# Patient Record
Sex: Male | Born: 1960 | Race: White | Hispanic: No | Marital: Married | Smoking: Current every day smoker
Health system: Southern US, Community
[De-identification: ages and names within clinical notes are randomized; demographics above are authoritative.]

## PROBLEM LIST (undated history)

## (undated) DIAGNOSIS — G7109 Other specified muscular dystrophies: Secondary | ICD-10-CM

## (undated) DIAGNOSIS — F1090 Alcohol use, unspecified, uncomplicated: Secondary | ICD-10-CM

## (undated) DIAGNOSIS — R569 Unspecified convulsions: Secondary | ICD-10-CM

## (undated) DIAGNOSIS — Z720 Tobacco use: Secondary | ICD-10-CM

## (undated) DIAGNOSIS — H539 Unspecified visual disturbance: Secondary | ICD-10-CM

## (undated) DIAGNOSIS — D638 Anemia in other chronic diseases classified elsewhere: Secondary | ICD-10-CM

## (undated) DIAGNOSIS — G71039 Limb girdle muscular dystrophy, unspecified: Secondary | ICD-10-CM

## (undated) DIAGNOSIS — Z7289 Other problems related to lifestyle: Secondary | ICD-10-CM

## (undated) DIAGNOSIS — I480 Paroxysmal atrial fibrillation: Secondary | ICD-10-CM

## (undated) DIAGNOSIS — D649 Anemia, unspecified: Secondary | ICD-10-CM

## (undated) DIAGNOSIS — I2699 Other pulmonary embolism without acute cor pulmonale: Secondary | ICD-10-CM

## (undated) DIAGNOSIS — I10 Essential (primary) hypertension: Secondary | ICD-10-CM

## (undated) DIAGNOSIS — N186 End stage renal disease: Secondary | ICD-10-CM

## (undated) DIAGNOSIS — W19XXXA Unspecified fall, initial encounter: Secondary | ICD-10-CM

## (undated) DIAGNOSIS — E559 Vitamin D deficiency, unspecified: Secondary | ICD-10-CM

## (undated) DIAGNOSIS — R296 Repeated falls: Secondary | ICD-10-CM

## (undated) DIAGNOSIS — Z789 Other specified health status: Secondary | ICD-10-CM

## (undated) DIAGNOSIS — G7100 Muscular dystrophy, unspecified: Secondary | ICD-10-CM

## (undated) HISTORY — DX: Unspecified visual disturbance: H53.9

## (undated) HISTORY — PX: CARDIAC SURGERY: SHX584

---

## 2000-05-12 DIAGNOSIS — R079 Chest pain, unspecified: Secondary | ICD-10-CM

## 2000-05-12 HISTORY — DX: Chest pain, unspecified: R07.9

## 2000-05-18 ENCOUNTER — Inpatient Hospital Stay (HOSPITAL_COMMUNITY): Admission: AD | Admit: 2000-05-18 | Discharge: 2000-05-19 | Payer: Self-pay | Admitting: Cardiovascular Disease

## 2007-10-20 ENCOUNTER — Ambulatory Visit (HOSPITAL_COMMUNITY): Admission: RE | Admit: 2007-10-20 | Discharge: 2007-10-20 | Payer: Self-pay | Admitting: Internal Medicine

## 2009-05-12 DIAGNOSIS — I2699 Other pulmonary embolism without acute cor pulmonale: Secondary | ICD-10-CM

## 2009-05-12 HISTORY — DX: Other pulmonary embolism without acute cor pulmonale: I26.99

## 2010-04-06 ENCOUNTER — Inpatient Hospital Stay (HOSPITAL_COMMUNITY)
Admission: EM | Admit: 2010-04-06 | Discharge: 2010-04-22 | Disposition: A | Discharge status: Other Acute Inpatient Hospital | Payer: Self-pay | Source: Home / Self Care | Attending: Internal Medicine | Admitting: Internal Medicine

## 2010-04-11 ENCOUNTER — Encounter (INDEPENDENT_AMBULATORY_CARE_PROVIDER_SITE_OTHER): Payer: Self-pay | Admitting: Internal Medicine

## 2010-04-11 ENCOUNTER — Inpatient Hospital Stay (HOSPITAL_COMMUNITY)
Admission: AD | Admit: 2010-04-11 | Discharge: 2010-04-22 | Payer: Self-pay | Attending: Pulmonary Disease | Admitting: Pulmonary Disease

## 2010-04-11 ENCOUNTER — Ambulatory Visit: Payer: Self-pay | Admitting: Cardiology

## 2010-04-12 ENCOUNTER — Encounter (INDEPENDENT_AMBULATORY_CARE_PROVIDER_SITE_OTHER): Payer: Self-pay | Admitting: Internal Medicine

## 2010-05-09 ENCOUNTER — Ambulatory Visit: Payer: Self-pay | Admitting: Critical Care Medicine

## 2010-05-17 ENCOUNTER — Encounter: Payer: Self-pay | Admitting: Critical Care Medicine

## 2010-05-20 ENCOUNTER — Encounter: Payer: Self-pay | Admitting: Critical Care Medicine

## 2010-05-21 DIAGNOSIS — I10 Essential (primary) hypertension: Secondary | ICD-10-CM | POA: Insufficient documentation

## 2010-05-21 DIAGNOSIS — J90 Pleural effusion, not elsewhere classified: Secondary | ICD-10-CM | POA: Insufficient documentation

## 2010-05-21 DIAGNOSIS — I2699 Other pulmonary embolism without acute cor pulmonale: Secondary | ICD-10-CM | POA: Insufficient documentation

## 2010-05-21 DIAGNOSIS — N179 Acute kidney failure, unspecified: Secondary | ICD-10-CM | POA: Insufficient documentation

## 2010-05-22 ENCOUNTER — Ambulatory Visit
Admission: RE | Admit: 2010-05-22 | Discharge: 2010-05-22 | Payer: Self-pay | Source: Home / Self Care | Attending: Critical Care Medicine | Admitting: Critical Care Medicine

## 2010-05-30 ENCOUNTER — Telehealth (INDEPENDENT_AMBULATORY_CARE_PROVIDER_SITE_OTHER): Payer: Self-pay | Admitting: *Deleted

## 2010-06-02 ENCOUNTER — Encounter: Payer: Self-pay | Admitting: Internal Medicine

## 2010-06-13 NOTE — Assessment & Plan Note (Signed)
Summary: Pulmonary OV   CC:  HFU.  Sill having soreness in right lung.  Ocass wheezing.  Nonprod cough.  Denies SOB and chest tightness. Marland Kitchen  History of Present Illness: Pulmonary post hosp. Pt in [place] 12/1- 12/12 with resp failure, ARF, PE, L pleural effusion, HTN. Cr down now to 6 from 14 Pt not on HD now.  Pt on coumadin per [place] dyspnea is better.  there is less cough and wheeze. There is less mucus production.  No real edema.   Pt denies any significant sore throat, nasal congestion or excess secretions, fever, chills, sweats, unintended weight loss, pleurtic or exertional chest pain, orthopnea PND, or leg swelling Pt denies any increase in rescue therapy over baseline, denies waking up needing it or having any early am or nocturnal exacerbations of coughing/wheezing/or dyspnea.     Preventive Screening-Counseling & Management  Alcohol-Tobacco     Smoking Status: current     Packs/Day: 1.0     Pack years: 40  Current Medications (verified): 1)  Tylenol 8 Hour 650 Mg Cr-Tabs (Acetaminophen) .... Every 6 Hours As Needed Pain 2)  Coumadin 5 Mg Tabs (Warfarin Sodium) .... As Directed By [facility] 3)  Cardizem 90 Mg Tabs (Diltiazem Hcl) .... Take 1 Tablet By Mouth Once A Day 4)  Furosemide 80 Mg Tabs (Furosemide) .... 1/2 Tablet Once Daily  Allergies (verified): 1)  ! * Ivp Dye  Past History:  Past medical, surgical, family and social histories (including risk factors) reviewed, and no changes noted (except as noted below).  Past Medical History: Reviewed history from 05/21/2010 and no changes required. HYPERTENSION (ICD-401.9) RENAL FAILURE, ACUTE (ICD-584.9) PLEURAL EFFUSION (ICD-511.9) PULMONARY EMBOLISM (ICD-415.19)  Past Surgical History: hernia sugery  Family History: Reviewed history and no changes required. brother - DM  Social History: Reviewed history and no changes required. Current smoker.  started at  age 70.  1 ppd. alcohol - 1-2 beers/day married 1 daughter [employer]ain[employer] at [facility] [employer] [facility] [facility] Smoking Status:  current Packs/Day:  1.0 Pack years:  40  Review of Systems       The patient complains of shortness of breath with activity and non-productive cough.  The patient denies shortness of breath at rest, productive cough, coughing up blood, chest pain, irregular heartbeats, acid heartburn, indigestion, loss of appetite, weight change, abdominal pain, difficulty swallowing, sore throat, tooth/dental problems, headaches, nasal congestion/difficulty breathing through nose, sneezing, itching, ear ache, anxiety, depression, hand/feet swelling, joint stiffness or pain, rash, change in color of mucus, and fever.    Vital Signs:  Patient profile:   50 year old male Height:      74 inches Weight:      172.25 pounds BMI:     22.20 O2 Sat:      97 % on Room air Temp:     97.7 degrees F oral Pulse rate:   62 / minute BP sitting:   140 / 90  (left arm) Cuff size:   regular  Vitals Entered By: Raymondo Band RN (May 22, 2010 11:32 AM)  O2 Flow:  Room air  CC: HFU.  Sill having soreness in right lung.  Ocass wheezing.  Nonprod cough.  Denies SOB and chest tightness.  Comments Medications reviewed with patient Daytime contact number verified with patient. Raymondo Band RN  May 22, 2010 11:35 AM    Physical Exam  Additional Exam:  Gen: Pleasant, well-nourished, in no distress,  normal affect ENT: No lesions,  mouth  clear,  oropharynx clear, no postnasal drip Neck: No JVD, no TMG, no carotid bruits Lungs: No use of accessory muscles, no dullness to percussion, decrease BS L lung  Cardiovascular: RRR, heart sounds normal, no murmur or gallops, no peripheral edema Abdomen: soft and NT, no HSM,  BS normal Musculoskeletal: No deformities, no cyanosis or clubbing Neuro: alert, non focal Skin: Warm, no lesions or rashes    Impression & Recommendations:  Problem # 1:  PLEURAL EFFUSION  (ICD-511.9) Assessment Improved pleural effusion, improved. from pulmonary embolii plan monitor Orders: Est. Patient Level V [id number]) T-2 View CXR ([id number])  Problem # 2:  PULMONARY EMBOLISM (ICD-415.19) Assessment: Improved pulmonary embolii improved plan cont coumadin minimum 57months until 12/12. coumadin per [facility] His updated medication list for this problem includes:    Coumadin 5 Mg Tabs (Warfarin sodium) .Marland Kitchen... As directed by [facility]  Orders: Est. Patient Level V [id number]) T-2 View CXR ([id number])  Problem # 3:  RENAL FAILURE, ACUTE (ICD-584.9) Assessment: Improved renal failure  per renal  Medications Added to Medication List This Visit: 1)  Furosemide 80 Mg Tabs (Furosemide) .... 1/2 tablet once daily  Complete Medication List: 1)  Tylenol 8 Hour 650 Mg Cr-tabs (Acetaminophen) .... Every 6 hours as needed pain 2)  Coumadin 5 Mg Tabs (Warfarin sodium) .... As directed by [facility] 3)  Cardizem 90 Mg Tabs (Diltiazem hcl) .... Take 1 tablet by mouth once a day 4)  Furosemide 80 Mg Tabs (Furosemide) .... 1/2 tablet once daily  Patient Instructions: 1)  Stay on coumadin 2)  We will obtain a chest xray today, we will call results 3)  We may need to drain fluid off your right lung with a needle aspirate 4)  Return 3 months Prescriptions: COUMADIN 5 MG TABS (WARFARIN SODIUM) as directed by [facility]  #60 x 6   Entered and Authorized by:   [name] MD   Signed by:   [name] MD on 05/22/2010   Method used:   Electronically to        [facility] (retail)       [address]       [place], [place]  [address]       Ph: [phone]       Fax: [phone]   RxIDWX:[id number] CARDIZEM 90 MG TABS (DILTIAZEM HCL) Take 1 tablet by mouth once a day  #30 x 6   Entered and Authorized by:   [name] MD   Signed by:    [name] MD on 05/22/2010   Method used:   Electronically to        [facility] (retail)       [address]       [place],   [address]       Ph: [phone]       Fax: [phone]   RxID:   [id number]    Immunization History:  Influenza Immunization History:    Influenza:  historical (04/11/2010)  Pneumovax Immunization History:    Pneumovax:  historical (04/11/2010)   Appended Document: Pulmonary OV fax [facility] [place] [facility]

## 2010-06-13 NOTE — Letter (Signed)
Summary: [facility]   Imported By: Phillis Knack 06/05/2010 08:02:48  _____________________________________________________________________  External Attachment:    Type:   Image     Comment:   External Document

## 2010-06-13 NOTE — Progress Notes (Signed)
Summary: results  Phone Note Call from Patient Call back at Home Phone [phone]   Caller: Steven Sellers Call For: [name] Reason for Call: Lab or Test Results Summary of Call: Requesting xray results from last week. Initial call taken by: [name],  May 30, 2010 2:33 PM  Follow-up for Phone Call        Pls advise results of cxr thanks [name]  May 30, 2010 3:35 PM   Additional Follow-up for Phone Call Additional follow up Details #1::        cxr shows fluid and inflammation gone I tried to call but number in chart seems incorrect i get a rapid busy signal Additional Follow-up by: [name] MD,  May 30, 2010 3:41 PM    Additional Follow-up for Phone Call Additional follow up Details #2::    Spoke with pt and notified of the above recs per PW.  Pt verbalized understanding. Follow-up by: [name],  May 30, 2010 3:49 PM

## 2010-07-02 ENCOUNTER — Encounter: Payer: Self-pay | Admitting: Critical Care Medicine

## 2010-07-22 LAB — PROTIME-INR
INR: 1.44 (ref 0.00–1.49)
INR: 1.66 — ABNORMAL HIGH (ref 0.00–1.49)
INR: 2.46 — ABNORMAL HIGH (ref 0.00–1.49)
INR: 2.51 — ABNORMAL HIGH (ref 0.00–1.49)
INR: 2.98 — ABNORMAL HIGH (ref 0.00–1.49)
INR: 3.03 — ABNORMAL HIGH (ref 0.00–1.49)
INR: 6.2 (ref 0.00–1.49)
Prothrombin Time: 17.7 seconds — ABNORMAL HIGH (ref 11.6–15.2)
Prothrombin Time: 19.8 seconds — ABNORMAL HIGH (ref 11.6–15.2)
Prothrombin Time: 20.1 seconds — ABNORMAL HIGH (ref 11.6–15.2)
Prothrombin Time: 22.4 seconds — ABNORMAL HIGH (ref 11.6–15.2)
Prothrombin Time: 26.8 seconds — ABNORMAL HIGH (ref 11.6–15.2)
Prothrombin Time: 27.2 seconds — ABNORMAL HIGH (ref 11.6–15.2)
Prothrombin Time: 31 seconds — ABNORMAL HIGH (ref 11.6–15.2)
Prothrombin Time: 33.5 seconds — ABNORMAL HIGH (ref 11.6–15.2)
Prothrombin Time: 54.5 seconds — ABNORMAL HIGH (ref 11.6–15.2)

## 2010-07-22 LAB — GLUCOSE, CAPILLARY
Glucose-Capillary: 102 mg/dL — ABNORMAL HIGH (ref 70–99)
Glucose-Capillary: 106 mg/dL — ABNORMAL HIGH (ref 70–99)
Glucose-Capillary: 106 mg/dL — ABNORMAL HIGH (ref 70–99)
Glucose-Capillary: 108 mg/dL — ABNORMAL HIGH (ref 70–99)
Glucose-Capillary: 113 mg/dL — ABNORMAL HIGH (ref 70–99)
Glucose-Capillary: 113 mg/dL — ABNORMAL HIGH (ref 70–99)
Glucose-Capillary: 116 mg/dL — ABNORMAL HIGH (ref 70–99)
Glucose-Capillary: 117 mg/dL — ABNORMAL HIGH (ref 70–99)
Glucose-Capillary: 162 mg/dL — ABNORMAL HIGH (ref 70–99)
Glucose-Capillary: 78 mg/dL (ref 70–99)
Glucose-Capillary: 80 mg/dL (ref 70–99)
Glucose-Capillary: 86 mg/dL (ref 70–99)
Glucose-Capillary: 88 mg/dL (ref 70–99)
Glucose-Capillary: 88 mg/dL (ref 70–99)
Glucose-Capillary: 91 mg/dL (ref 70–99)
Glucose-Capillary: 96 mg/dL (ref 70–99)
Glucose-Capillary: 99 mg/dL (ref 70–99)

## 2010-07-22 LAB — CBC
HCT: 35.8 % — ABNORMAL LOW (ref 39.0–52.0)
HCT: 36.1 % — ABNORMAL LOW (ref 39.0–52.0)
HCT: 37 % — ABNORMAL LOW (ref 39.0–52.0)
Hemoglobin: 12 g/dL — ABNORMAL LOW (ref 13.0–17.0)
Hemoglobin: 12.7 g/dL — ABNORMAL LOW (ref 13.0–17.0)
Hemoglobin: 12.8 g/dL — ABNORMAL LOW (ref 13.0–17.0)
Hemoglobin: 13.6 g/dL (ref 13.0–17.0)
MCH: 30.4 pg (ref 26.0–34.0)
MCH: 31.2 pg (ref 26.0–34.0)
MCH: 31.3 pg (ref 26.0–34.0)
MCH: 31.4 pg (ref 26.0–34.0)
MCH: 31.4 pg (ref 26.0–34.0)
MCHC: 33.5 g/dL (ref 30.0–36.0)
MCHC: 33.5 g/dL (ref 30.0–36.0)
MCHC: 33.8 g/dL (ref 30.0–36.0)
MCHC: 34.3 g/dL (ref 30.0–36.0)
MCV: 91.7 fL (ref 78.0–100.0)
MCV: 92.3 fL (ref 78.0–100.0)
Platelets: 211 10*3/uL (ref 150–400)
Platelets: 271 10*3/uL (ref 150–400)
Platelets: 327 10*3/uL (ref 150–400)
RBC: 3.91 MIL/uL — ABNORMAL LOW (ref 4.22–5.81)
RBC: 4.04 MIL/uL — ABNORMAL LOW (ref 4.22–5.81)
RBC: 4.26 MIL/uL (ref 4.22–5.81)
RBC: 4.47 MIL/uL (ref 4.22–5.81)
RDW: 13 % (ref 11.5–15.5)
RDW: 13.1 % (ref 11.5–15.5)
RDW: 13.1 % (ref 11.5–15.5)
RDW: 13.2 % (ref 11.5–15.5)
RDW: 13.2 % (ref 11.5–15.5)
WBC: 10.3 10*3/uL (ref 4.0–10.5)
WBC: 8.7 10*3/uL (ref 4.0–10.5)

## 2010-07-22 LAB — RENAL FUNCTION PANEL
Albumin: 1.6 g/dL — ABNORMAL LOW (ref 3.5–5.2)
Albumin: 1.6 g/dL — ABNORMAL LOW (ref 3.5–5.2)
Albumin: 1.9 g/dL — ABNORMAL LOW (ref 3.5–5.2)
BUN: 41 mg/dL — ABNORMAL HIGH (ref 6–23)
BUN: 49 mg/dL — ABNORMAL HIGH (ref 6–23)
BUN: 51 mg/dL — ABNORMAL HIGH (ref 6–23)
BUN: 55 mg/dL — ABNORMAL HIGH (ref 6–23)
CO2: 24 mEq/L (ref 19–32)
Calcium: 7.9 mg/dL — ABNORMAL LOW (ref 8.4–10.5)
Calcium: 8 mg/dL — ABNORMAL LOW (ref 8.4–10.5)
Calcium: 8.1 mg/dL — ABNORMAL LOW (ref 8.4–10.5)
Chloride: 104 mEq/L (ref 96–112)
Chloride: 98 mEq/L (ref 96–112)
Creatinine, Ser: 6.96 mg/dL — ABNORMAL HIGH (ref 0.4–1.5)
Creatinine, Ser: 8.16 mg/dL — ABNORMAL HIGH (ref 0.4–1.5)
Creatinine, Ser: 9.18 mg/dL — ABNORMAL HIGH (ref 0.4–1.5)
Creatinine, Ser: 9.26 mg/dL — ABNORMAL HIGH (ref 0.4–1.5)
GFR calc Af Amer: 7 mL/min — ABNORMAL LOW (ref >60)
GFR calc non Af Amer: 6 mL/min — ABNORMAL LOW (ref >60)
Glucose, Bld: 110 mg/dL — ABNORMAL HIGH (ref 70–99)
Glucose, Bld: 91 mg/dL (ref 70–99)
Phosphorus: 6.7 mg/dL — ABNORMAL HIGH (ref 2.3–4.6)
Phosphorus: 7 mg/dL — ABNORMAL HIGH (ref 2.3–4.6)
Phosphorus: 8.1 mg/dL — ABNORMAL HIGH (ref 2.3–4.6)
Potassium: 4.1 mEq/L (ref 3.5–5.1)
Potassium: 4.6 mEq/L (ref 3.5–5.1)
Sodium: 136 mEq/L (ref 135–145)

## 2010-07-22 LAB — CULTURE, BLOOD (ROUTINE X 2)
Culture  Setup Time: 201112020142
Culture  Setup Time: 201112020143
Culture: NO GROWTH

## 2010-07-22 LAB — PREPARE FRESH FROZEN PLASMA: Unit division: 0

## 2010-07-22 LAB — BASIC METABOLIC PANEL
BUN: 31 mg/dL — ABNORMAL HIGH (ref 6–23)
BUN: 36 mg/dL — ABNORMAL HIGH (ref 6–23)
BUN: 47 mg/dL — ABNORMAL HIGH (ref 6–23)
BUN: 56 mg/dL — ABNORMAL HIGH (ref 6–23)
CO2: 21 mEq/L (ref 19–32)
CO2: 26 mEq/L (ref 19–32)
Calcium: 7.6 mg/dL — ABNORMAL LOW (ref 8.4–10.5)
Calcium: 7.6 mg/dL — ABNORMAL LOW (ref 8.4–10.5)
Calcium: 7.6 mg/dL — ABNORMAL LOW (ref 8.4–10.5)
Calcium: 7.9 mg/dL — ABNORMAL LOW (ref 8.4–10.5)
Calcium: 8.4 mg/dL (ref 8.4–10.5)
Chloride: 103 mEq/L (ref 96–112)
Chloride: 97 mEq/L (ref 96–112)
Creatinine, Ser: 5.63 mg/dL — ABNORMAL HIGH (ref 0.4–1.5)
Creatinine, Ser: 5.92 mg/dL — ABNORMAL HIGH (ref 0.4–1.5)
Creatinine, Ser: 6.73 mg/dL — ABNORMAL HIGH (ref 0.4–1.5)
Creatinine, Ser: 8.07 mg/dL — ABNORMAL HIGH (ref 0.4–1.5)
GFR calc Af Amer: 12 mL/min — ABNORMAL LOW (ref >60)
GFR calc Af Amer: 8 mL/min — ABNORMAL LOW (ref >60)
GFR calc non Af Amer: 10 mL/min — ABNORMAL LOW (ref >60)
GFR calc non Af Amer: 11 mL/min — ABNORMAL LOW (ref >60)
GFR calc non Af Amer: 7 mL/min — ABNORMAL LOW (ref >60)
GFR calc non Af Amer: 9 mL/min — ABNORMAL LOW (ref >60)
Glucose, Bld: 104 mg/dL — ABNORMAL HIGH (ref 70–99)
Glucose, Bld: 129 mg/dL — ABNORMAL HIGH (ref 70–99)
Glucose, Bld: 172 mg/dL — ABNORMAL HIGH (ref 70–99)
Glucose, Bld: 69 mg/dL — ABNORMAL LOW (ref 70–99)
Glucose, Bld: 93 mg/dL (ref 70–99)
Potassium: 4.1 mEq/L (ref 3.5–5.1)
Potassium: 4.2 mEq/L (ref 3.5–5.1)
Sodium: 138 mEq/L (ref 135–145)

## 2010-07-22 LAB — POCT I-STAT 3, ART BLOOD GAS (G3+)
Acid-base deficit: 4 mmol/L — ABNORMAL HIGH (ref 0.0–2.0)
Bicarbonate: 26.1 mEq/L — ABNORMAL HIGH (ref 20.0–24.0)
O2 Saturation: 95 %
Patient temperature: 37
Patient temperature: 97.5
pH, Arterial: 7.39 (ref 7.350–7.450)

## 2010-07-22 LAB — PROTEIN ELECTROPH W RFLX QUANT IMMUNOGLOBULINS
Albumin ELP: 36.1 % — ABNORMAL LOW (ref 55.8–66.1)
Beta 2: 9.6 % — ABNORMAL HIGH (ref 3.2–6.5)
Gamma Globulin: 11.3 % (ref 11.1–18.8)

## 2010-07-22 LAB — HEPATITIS B CORE ANTIBODY, TOTAL: Hep B Core Total Ab: NEGATIVE

## 2010-07-22 LAB — URINALYSIS, ROUTINE W REFLEX MICROSCOPIC
Bilirubin Urine: NEGATIVE
Glucose, UA: NEGATIVE mg/dL
Ketones, ur: 15 mg/dL — AB
Nitrite: POSITIVE — AB
Protein, ur: 100 mg/dL — AB
pH: 5 (ref 5.0–8.0)

## 2010-07-22 LAB — UIFE/LIGHT CHAINS/TP QN, 24-HR UR
Albumin, U: DETECTED
Alpha 2, Urine: DETECTED — AB
Beta, Urine: DETECTED — AB
Total Protein, Urine: 26.4 mg/dL

## 2010-07-22 LAB — COMPREHENSIVE METABOLIC PANEL
ALT: 18 U/L (ref 0–53)
Alkaline Phosphatase: 127 U/L — ABNORMAL HIGH (ref 39–117)
BUN: 22 mg/dL (ref 6–23)
Calcium: 7.4 mg/dL — ABNORMAL LOW (ref 8.4–10.5)
Calcium: 7.8 mg/dL — ABNORMAL LOW (ref 8.4–10.5)
Creatinine, Ser: 6.15 mg/dL — ABNORMAL HIGH (ref 0.4–1.5)
GFR calc non Af Amer: 10 mL/min — ABNORMAL LOW (ref >60)
Glucose, Bld: 103 mg/dL — ABNORMAL HIGH (ref 70–99)
Glucose, Bld: 111 mg/dL — ABNORMAL HIGH (ref 70–99)
Potassium: 4.1 mEq/L (ref 3.5–5.1)
Sodium: 137 mEq/L (ref 135–145)
Total Protein: 4.7 g/dL — ABNORMAL LOW (ref 6.0–8.3)
Total Protein: 5.1 g/dL — ABNORMAL LOW (ref 6.0–8.3)

## 2010-07-22 LAB — C3 COMPLEMENT: C3 Complement: 131 mg/dL (ref 88–201)

## 2010-07-22 LAB — IGG, IGA, IGM: IgM, Serum: 61 mg/dL (ref 60–263)

## 2010-07-22 LAB — LACTIC ACID, PLASMA: Lactic Acid, Venous: 1.2 mmol/L (ref 0.5–2.2)

## 2010-07-22 LAB — URINE MICROSCOPIC-ADD ON

## 2010-07-22 LAB — APTT
aPTT: 106 seconds — ABNORMAL HIGH (ref 24–37)
aPTT: 73 seconds — ABNORMAL HIGH (ref 24–37)
aPTT: 82 seconds — ABNORMAL HIGH (ref 24–37)

## 2010-07-22 LAB — HEPATITIS B SURFACE ANTIBODY,QUALITATIVE: Hep B S Ab: NEGATIVE

## 2010-07-22 LAB — TYPE AND SCREEN: ABO/RH(D): A POS

## 2010-07-22 LAB — ALT: ALT: 20 U/L (ref 0–53)

## 2010-07-22 LAB — MRSA PCR SCREENING: MRSA by PCR: NEGATIVE

## 2010-07-22 LAB — EXPECTORATED SPUTUM ASSESSMENT W GRAM STAIN, RFLX TO RESP C

## 2010-07-22 LAB — HEMOGLOBIN A1C
Hgb A1c MFr Bld: 5.5 % (ref <5.7)
Mean Plasma Glucose: 111 mg/dL (ref <117)

## 2010-07-22 LAB — IMMUNOFIXATION ADD-ON

## 2010-07-23 LAB — URINALYSIS, MICROSCOPIC ONLY
Glucose, UA: NEGATIVE mg/dL
Leukocytes, UA: NEGATIVE
pH: 6.5 (ref 5.0–8.0)

## 2010-07-23 LAB — CBC
HCT: 43.1 % (ref 39.0–52.0)
HCT: 43.8 % (ref 39.0–52.0)
HCT: 52.7 % — ABNORMAL HIGH (ref 39.0–52.0)
Hemoglobin: 14 g/dL (ref 13.0–17.0)
Hemoglobin: 14.2 g/dL (ref 13.0–17.0)
Hemoglobin: 14.6 g/dL (ref 13.0–17.0)
Hemoglobin: 15.1 g/dL (ref 13.0–17.0)
MCH: 31.6 pg (ref 26.0–34.0)
MCH: 32.3 pg (ref 26.0–34.0)
MCH: 33.2 pg (ref 26.0–34.0)
MCV: 93.8 fL (ref 78.0–100.0)
MCV: 94.1 fL (ref 78.0–100.0)
MCV: 94.6 fL (ref 78.0–100.0)
Platelets: 151 10*3/uL (ref 150–400)
Platelets: 159 10*3/uL (ref 150–400)
Platelets: 171 10*3/uL (ref 150–400)
Platelets: 203 10*3/uL (ref 150–400)
Platelets: 203 10*3/uL (ref 150–400)
RBC: 4.39 MIL/uL (ref 4.22–5.81)
RBC: 4.43 MIL/uL (ref 4.22–5.81)
RBC: 4.45 MIL/uL (ref 4.22–5.81)
RBC: 4.58 MIL/uL (ref 4.22–5.81)
RBC: 4.67 MIL/uL (ref 4.22–5.81)
RDW: 13 % (ref 11.5–15.5)
RDW: 13 % (ref 11.5–15.5)
WBC: 11 10*3/uL — ABNORMAL HIGH (ref 4.0–10.5)
WBC: 11.1 10*3/uL — ABNORMAL HIGH (ref 4.0–10.5)
WBC: 13.6 10*3/uL — ABNORMAL HIGH (ref 4.0–10.5)
WBC: 14.4 10*3/uL — ABNORMAL HIGH (ref 4.0–10.5)
WBC: 9.4 10*3/uL (ref 4.0–10.5)

## 2010-07-23 LAB — URINE CULTURE
Culture  Setup Time: 201111301425
Special Requests: POSITIVE

## 2010-07-23 LAB — PROTIME-INR
INR: 1.17 (ref 0.00–1.49)
INR: 2.38 — ABNORMAL HIGH (ref 0.00–1.49)
INR: 4.28 — ABNORMAL HIGH (ref 0.00–1.49)
Prothrombin Time: 21.6 seconds — ABNORMAL HIGH (ref 11.6–15.2)
Prothrombin Time: 24.7 seconds — ABNORMAL HIGH (ref 11.6–15.2)
Prothrombin Time: 41 seconds — ABNORMAL HIGH (ref 11.6–15.2)

## 2010-07-23 LAB — BLOOD GAS, ARTERIAL
Acid-base deficit: 5.8 mmol/L — ABNORMAL HIGH (ref 0.0–2.0)
Acid-base deficit: 7 mmol/L — ABNORMAL HIGH (ref 0.0–2.0)
Acid-base deficit: 7.2 mmol/L — ABNORMAL HIGH (ref 0.0–2.0)
Bicarbonate: 20.9 mEq/L (ref 20.0–24.0)
O2 Saturation: 98 %
Patient temperature: 37
TCO2: 17.7 mmol/L (ref 0–100)
TCO2: 19.2 mmol/L (ref 0–100)
pCO2 arterial: 40.1 mmHg (ref 35.0–45.0)
pCO2 arterial: 48.4 mmHg — ABNORMAL HIGH (ref 35.0–45.0)
pO2, Arterial: 109 mmHg — ABNORMAL HIGH (ref 80.0–100.0)

## 2010-07-23 LAB — BETA-2-GLYCOPROTEIN I ABS, IGG/M/A
Beta-2 Glyco I IgG: 17 G Units (ref <20)
Beta-2-Glycoprotein I IgM: 11 M Units (ref <20)

## 2010-07-23 LAB — COMPREHENSIVE METABOLIC PANEL
Albumin: 1.8 g/dL — ABNORMAL LOW (ref 3.5–5.2)
Alkaline Phosphatase: 75 U/L (ref 39–117)
BUN: 9 mg/dL (ref 6–23)
CO2: 25 mEq/L (ref 19–32)
Chloride: 102 mEq/L (ref 96–112)
Creatinine, Ser: 1.11 mg/dL (ref 0.4–1.5)
GFR calc non Af Amer: >60 mL/min (ref >60)
Glucose, Bld: 101 mg/dL — ABNORMAL HIGH (ref 70–99)
Potassium: 4 mEq/L (ref 3.5–5.1)
Total Bilirubin: 1 mg/dL (ref 0.3–1.2)

## 2010-07-23 LAB — BASIC METABOLIC PANEL
BUN: 35 mg/dL — ABNORMAL HIGH (ref 6–23)
BUN: 7 mg/dL (ref 6–23)
CO2: 21 mEq/L (ref 19–32)
CO2: 21 mEq/L (ref 19–32)
CO2: 28 mEq/L (ref 19–32)
Calcium: 7.6 mg/dL — ABNORMAL LOW (ref 8.4–10.5)
Chloride: 100 mEq/L (ref 96–112)
Chloride: 101 mEq/L (ref 96–112)
Chloride: 103 mEq/L (ref 96–112)
Creatinine, Ser: 0.85 mg/dL (ref 0.4–1.5)
Creatinine, Ser: 2.92 mg/dL — ABNORMAL HIGH (ref 0.4–1.5)
Creatinine, Ser: 5.74 mg/dL — ABNORMAL HIGH (ref 0.4–1.5)
GFR calc Af Amer: 19 mL/min — ABNORMAL LOW (ref >60)
GFR calc Af Amer: 28 mL/min — ABNORMAL LOW (ref >60)
GFR calc non Af Amer: 16 mL/min — ABNORMAL LOW (ref >60)
Glucose, Bld: 109 mg/dL — ABNORMAL HIGH (ref 70–99)
Glucose, Bld: 99 mg/dL (ref 70–99)
Potassium: 4 mEq/L (ref 3.5–5.1)
Potassium: 4.6 mEq/L (ref 3.5–5.1)
Potassium: 5.3 mEq/L — ABNORMAL HIGH (ref 3.5–5.1)
Sodium: 136 mEq/L (ref 135–145)
Sodium: 137 mEq/L (ref 135–145)

## 2010-07-23 LAB — RENAL FUNCTION PANEL
Albumin: 1.5 g/dL — ABNORMAL LOW (ref 3.5–5.2)
BUN: 31 mg/dL — ABNORMAL HIGH (ref 6–23)
Chloride: 105 mEq/L (ref 96–112)
Creatinine, Ser: 5.27 mg/dL — ABNORMAL HIGH (ref 0.4–1.5)
GFR calc Af Amer: 14 mL/min — ABNORMAL LOW (ref >60)
GFR calc non Af Amer: 12 mL/min — ABNORMAL LOW (ref >60)
Phosphorus: 9.1 mg/dL (ref 2.3–4.6)
Potassium: 5.5 mEq/L — ABNORMAL HIGH (ref 3.5–5.1)

## 2010-07-23 LAB — DIFFERENTIAL
Basophils Absolute: 0 10*3/uL (ref 0.0–0.1)
Basophils Absolute: 0 10*3/uL (ref 0.0–0.1)
Basophils Absolute: 0.1 10*3/uL (ref 0.0–0.1)
Basophils Relative: 0 % (ref 0–1)
Basophils Relative: 0 % (ref 0–1)
Basophils Relative: 0 % (ref 0–1)
Eosinophils Relative: 0 % (ref 0–5)
Eosinophils Relative: 1 % (ref 0–5)
Lymphocytes Relative: 11 % — ABNORMAL LOW (ref 12–46)
Lymphocytes Relative: 11 % — ABNORMAL LOW (ref 12–46)
Lymphocytes Relative: 9 % — ABNORMAL LOW (ref 12–46)
Lymphs Abs: 0.4 10*3/uL — ABNORMAL LOW (ref 0.7–4.0)
Lymphs Abs: 0.9 10*3/uL (ref 0.7–4.0)
Lymphs Abs: 1 10*3/uL (ref 0.7–4.0)
Monocytes Absolute: 1.4 10*3/uL — ABNORMAL HIGH (ref 0.1–1.0)
Monocytes Absolute: 1.8 10*3/uL — ABNORMAL HIGH (ref 0.1–1.0)
Monocytes Relative: 10 % (ref 3–12)
Monocytes Relative: 11 % (ref 3–12)
Monocytes Relative: 12 % (ref 3–12)
Neutro Abs: 11.8 10*3/uL — ABNORMAL HIGH (ref 1.7–7.7)
Neutro Abs: 8.1 10*3/uL — ABNORMAL HIGH (ref 1.7–7.7)
Neutro Abs: 8.4 10*3/uL — ABNORMAL HIGH (ref 1.7–7.7)
Neutro Abs: 8.5 10*3/uL — ABNORMAL HIGH (ref 1.7–7.7)
Neutrophils Relative %: 77 % (ref 43–77)
Neutrophils Relative %: 80 % — ABNORMAL HIGH (ref 43–77)
Neutrophils Relative %: 87 % — ABNORMAL HIGH (ref 43–77)

## 2010-07-23 LAB — HEPATITIS PANEL, ACUTE
Hep A IgM: NEGATIVE
Hep B C IgM: NEGATIVE
Hepatitis B Surface Ag: NEGATIVE

## 2010-07-23 LAB — COMPLEMENT, TOTAL: Compl, Total (CH50): >60 U/mL — ABNORMAL HIGH (ref 31–60)

## 2010-07-23 LAB — LUPUS ANTICOAGULANT PANEL
DRVVT: 43.6 secs (ref 36.2–44.3)
Lupus Anticoagulant: DETECTED — AB
PTTLA 4:1 Mix: 45.7 secs — ABNORMAL HIGH (ref 30.0–45.6)

## 2010-07-23 LAB — CARDIAC PANEL(CRET KIN+CKTOT+MB+TROPI)
CK, MB: 2.9 ng/mL (ref 0.3–4.0)
CK, MB: 3.7 ng/mL (ref 0.3–4.0)
Total CK: 132 U/L (ref 7–232)

## 2010-07-23 LAB — CULTURE, BLOOD (ROUTINE X 2)
Culture: NO GROWTH
Culture: NO GROWTH

## 2010-07-23 LAB — HEPARIN LEVEL (UNFRACTIONATED): Heparin Unfractionated: <0.1 IU/mL — ABNORMAL LOW (ref 0.30–0.70)

## 2010-07-23 LAB — HEPATIC FUNCTION PANEL
ALT: 22 U/L (ref 0–53)
AST: 28 U/L (ref 0–37)
Albumin: 2.5 g/dL — ABNORMAL LOW (ref 3.5–5.2)
Alkaline Phosphatase: 102 U/L (ref 39–117)
Bilirubin, Direct: 0.3 mg/dL (ref 0.0–0.3)
Total Bilirubin: 1.3 mg/dL — ABNORMAL HIGH (ref 0.3–1.2)

## 2010-07-23 LAB — HOMOCYSTEINE: Homocysteine: 15.1 umol/L (ref 4.0–15.4)

## 2010-07-23 LAB — APTT
aPTT: 107 seconds — ABNORMAL HIGH (ref 24–37)
aPTT: 78 seconds — ABNORMAL HIGH (ref 24–37)

## 2010-07-23 LAB — C4 COMPLEMENT: Complement C4, Body Fluid: 31 mg/dL (ref 16–47)

## 2010-07-23 LAB — CARDIOLIPIN ANTIBODIES, IGG, IGM, IGA
Anticardiolipin IgA: 26 APL U/mL — ABNORMAL HIGH (ref <22)
Anticardiolipin IgG: 4 GPL U/mL — ABNORMAL LOW (ref <23)

## 2010-07-23 LAB — PROTEIN C ACTIVITY: Protein C Activity: 58 % — ABNORMAL LOW (ref 75–133)

## 2010-07-23 LAB — ANA: Anti Nuclear Antibody(ANA): NEGATIVE

## 2010-07-23 LAB — CEA: CEA: <0.5 ng/mL (ref 0.0–5.0)

## 2010-07-23 LAB — PROTEIN C, TOTAL: Protein C, Total: 54 % — ABNORMAL LOW (ref 70–140)

## 2010-07-23 LAB — GLOMERULAR BASEMENT MEMBRANE ANTIBODIES

## 2010-07-23 LAB — LACTIC ACID, PLASMA: Lactic Acid, Venous: 1 mmol/L (ref 0.5–2.2)

## 2010-07-23 LAB — ANTITHROMBIN III: AntiThromb III Func: 136 % — ABNORMAL HIGH (ref 76–126)

## 2010-07-23 NOTE — Letter (Signed)
Summary: [facility]   Imported By: Phillis Knack 07/16/2010 12:24:22  _____________________________________________________________________  External Attachment:    Type:   Image     Comment:   External Document

## 2010-09-27 NOTE — Cardiovascular Report (Signed)
[place]. [facility]  Patient:    Steven Sellers, Steven Sellers                      MRN: [id number] Proc. Date: 05/19/00 Adm. Date:  [id number] Attending:  Bosie Clos CC:         Elsie Lincoln, M.D.  Walden Field, M.D., [place]   Cardiac Catheterization  PROCEDURE PERFORMED:  Coronary arteriography.  INDICATIONS:  Recurrent chest pain requiring two emergency room visits.  CORONARY ARTERIOGRAPHY:  Left main coronary artery:  The left main coronary artery was normal.  Left anterior descending artery:  The left anterior descending artery was normal.  There were a large intermediate branch which was normal.  The first diagonal branch was normal.  Circumflex coronary artery:  The circumflex coronary artery was nondominant and it was normal.  Right coronary artery:  The right coronary artery was dominant and it was normal.  RIGHT ANTERIOR OBLIQUE VENTRICULOGRAPHY:  The RAO ventriculography revealed normal ejection fraction in the 65% range.  There was no MR and no gradient across the aortic valve.  Aortic pressure was 137/70, LV pressure was 131/19.  IMPRESSION:  The patients chest pain would appear to be noncardiac in etiology.  So long as his femoral artery heals well, he will be discharged later today.  RECOMMENDATIONS:  He will follow up with Dr. Orson Ape. DD:  05/19/00 TD:  05/19/00 Job: [id number]

## 2010-09-27 NOTE — Discharge Summary (Signed)
[name]  Patient:    Steven Sellers, Steven Sellers                      MRN: [id number] Adm. Date:  [id number] Disc. Date: 05/19/00 Attending:  Bosie Clos Dictator:   Tad Moore, P.A.- C. CC:         Wallis Bamberg. Johnsie Cancel, M.D. [facility]  Elsie Lincoln, M.D.; [place], [place], [place] [address]   Discharge Summary  DISCHARGE DIAGNOSIS:   Chest pain, uncertain origin.  HOSPITAL COURSE:  The patient presented to the emergency room complaining of chest pain.  He was seen in the emergency room approximately two weeks ago. Details of that visit are not known at this time.  Pain on presentation to the emergency room was described as not being consistently exertional, however, it was responsive to nitroglycerin.  He had no associated shortness of breath, nausea, vomiting or diaphoresis.  Cardiac risk factors include a family history of one pack per day to tobacco usage.  Also, at the time of admission, his lipid status is unknown.  He was seen and admitted by Dr. Jenkins Rouge. Admitting diagnosis was unstable angina.  The patient was placed on nitroglycerin and heparin.  On May 19, 2000, the Dr. Johnsie Cancel took the patient to the catheterization lab.  Catheterization revealed normal coronaries, normal left ventricle, ejection fraction of 55%.  The patient is stable and pain free at the time of discharge.  DISCHARGE MEDICATIONS:  Enteric coated aspirin 325 mg p.o. q.d.  DISCHARGE INSTRUCTIONS: 1. The patient was to avoid heavy lifting, driving, sexual activity for the    next 48 hours. 2. The patient is to follow a low fat, low cholesterol diet. 3. The patient is to watch the catheterization for pain, bleeding,    swelling, discharge.  He is to call the [place] office if he has    any of these problems. 4. The patient was advised to stop smoking. 5. The patient is to call Dr. Anitra Lauth for an appointment on Monday,    May 26, 1999.  LABORATORY DATA:   White count 8.7, hemoglobin 13.4, hematocrit 39.2, platelets 186,000.  Sodium 135, potassium 3.9, chloride 101, CO2 24, BUN 14, creatinine 0.8, glucose 108.  Of note, AST was 45 and ALT was 46.  The patients lipids were elevated.  Total cholesterol was 225, triglycerides were markedly elevated at 1086, HDL 38.  The LDL was not able to be calculated because of her triglyceride level.  Given the patient is to have an appointment with Dr. Orson Ape next week, lipid management is going to be left up to his discretion.  The patient is discharge home in stable condition. DD:  05/19/00 TD:  05/19/00 Job: [id number]

## 2010-10-09 ENCOUNTER — Encounter: Payer: Self-pay | Admitting: Critical Care Medicine

## 2012-02-07 IMAGING — CR DG RIBS W/ CHEST 3+V*R*
5 series · 5 of 5 positions shown · non-contrast
Comparison: CT chest 10/20/2007

CLINICAL DATA: Rib pain and difficulty breathing.

RIGHT RIBS AND CHEST - 3+ VIEW

[view not recorded (1 of 5)]
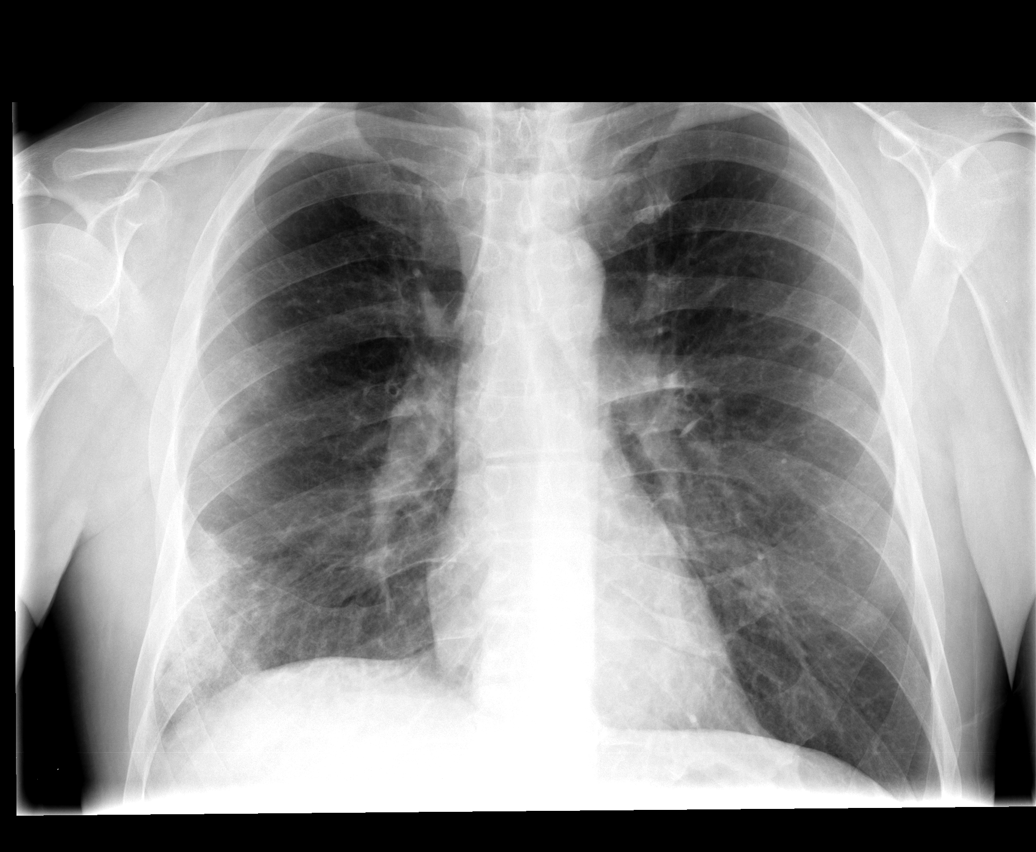

[view not recorded (2 of 5)]
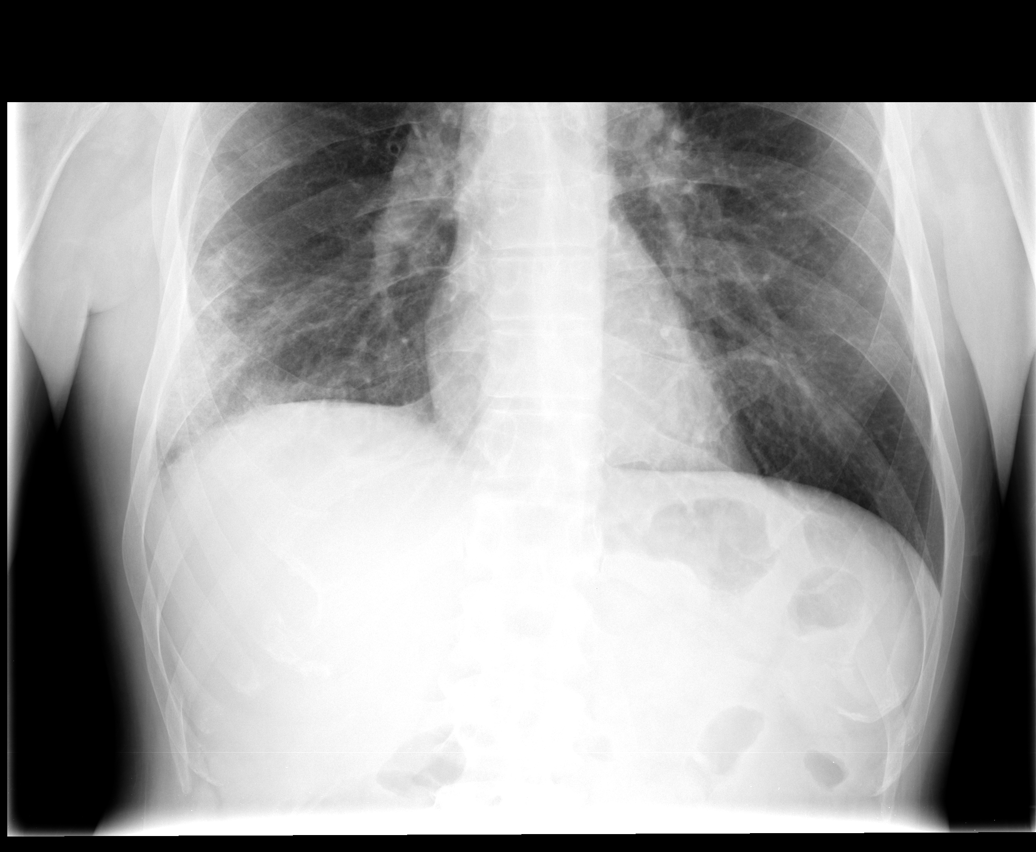

[view not recorded (3 of 5)]
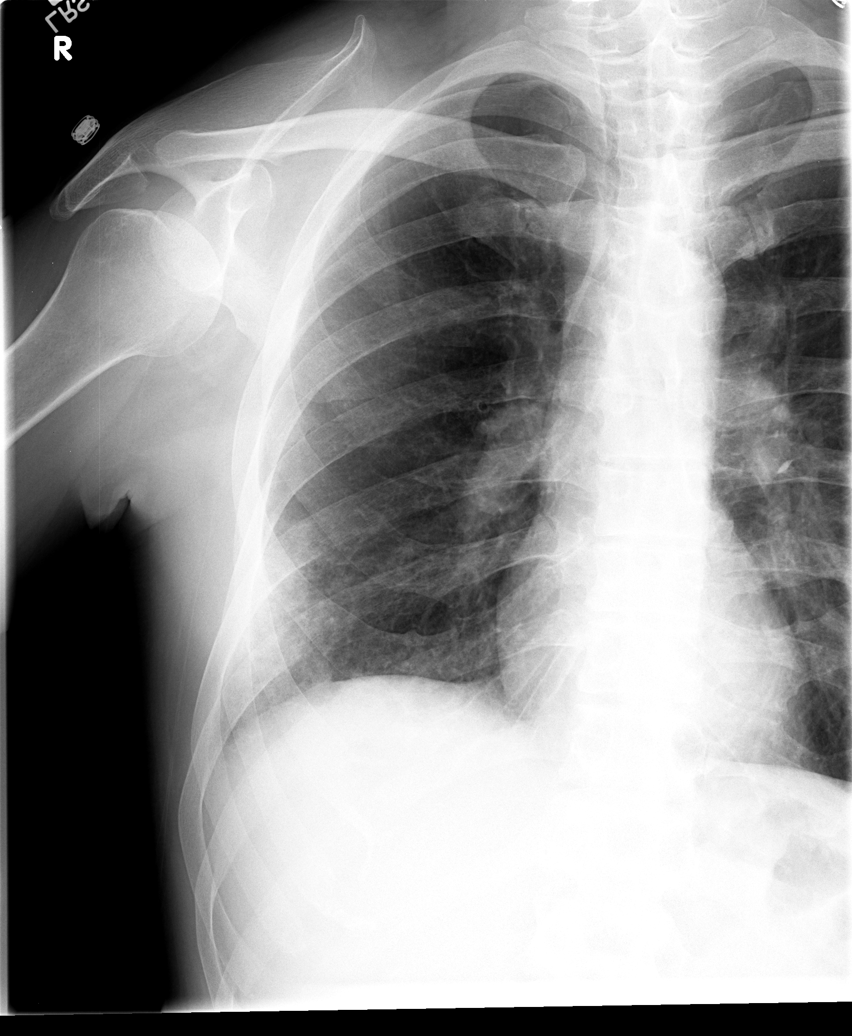

[view not recorded (4 of 5)]
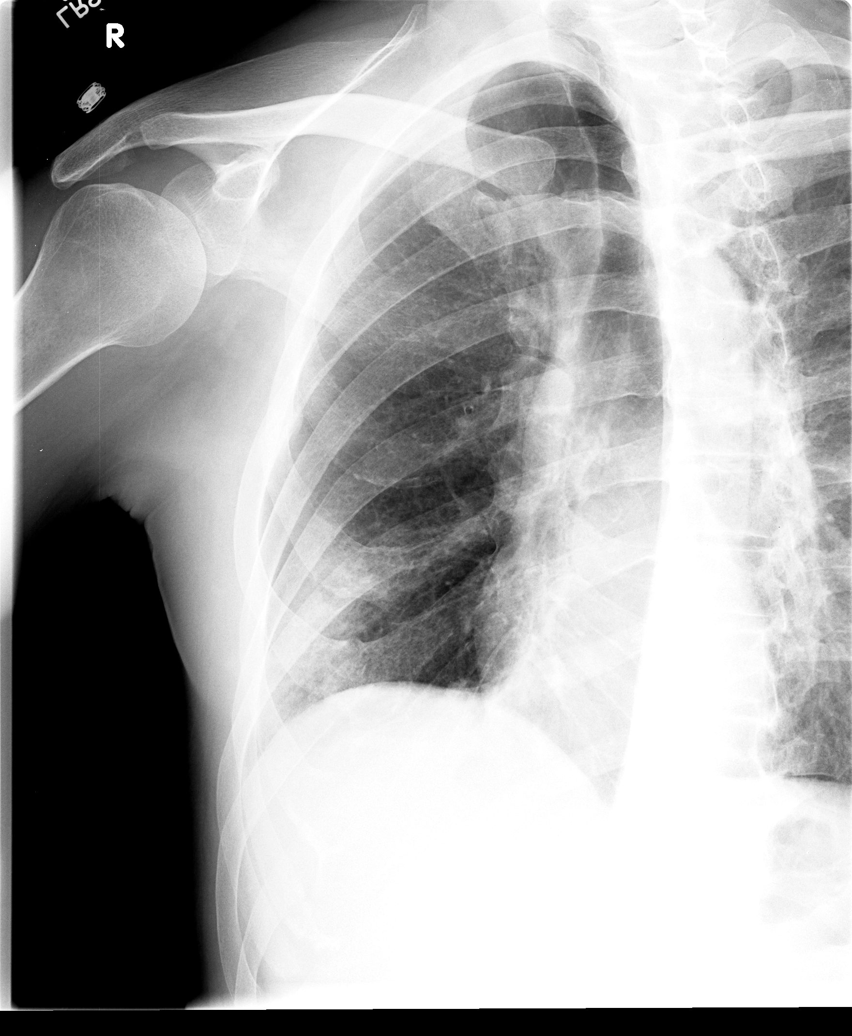

[view not recorded (5 of 5)]
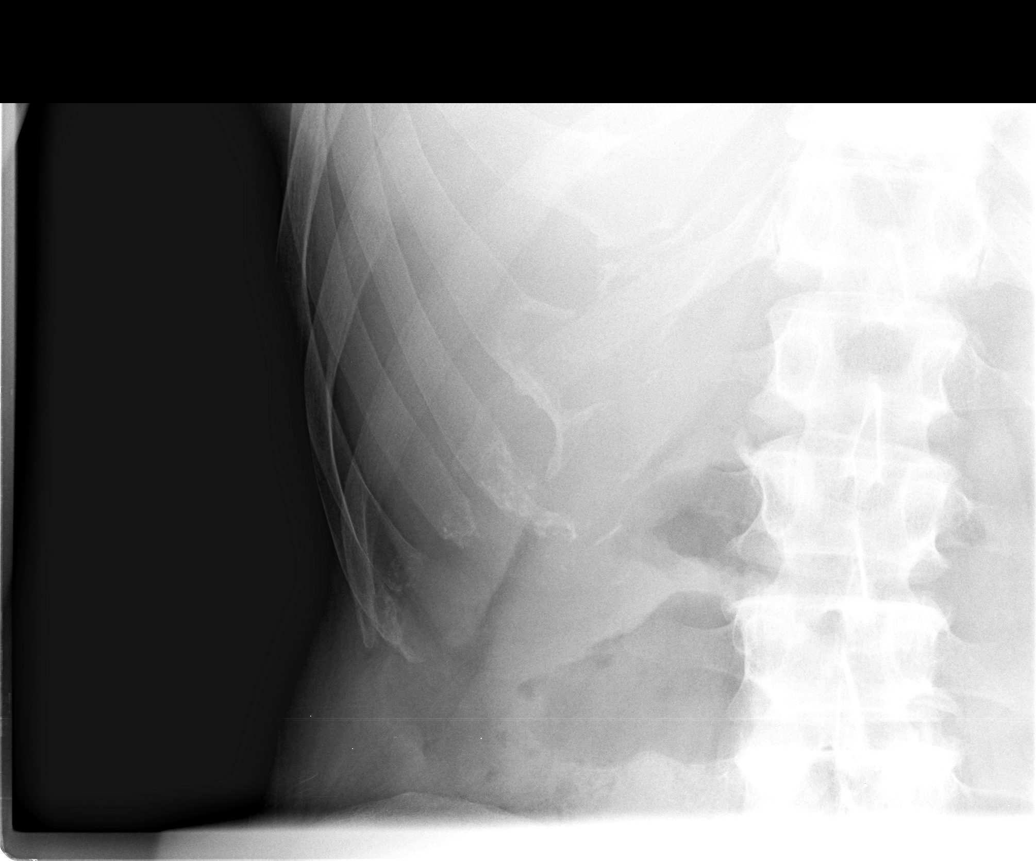

[5 of 5 positions shown; findings below may reference images not displayed]

FINDINGS: There is a peripherally based airspace opacity at the
right lung base that appears to abut the pleura.  No definite
pleural pleural effusion.  Left lung is clear.  Heart mediastinal
contours are normal.

No acute rib fracture is identified.
IMPRESSION: Peripheral airspace opacity right lung base.  Primary
considerations include pneumonia or pulmonary infarct. Suggest
clinical correlation.  Radiographic follow-up to clearing is
recommended.

## 2015-04-05 ENCOUNTER — Emergency Department (HOSPITAL_COMMUNITY): Payer: 59

## 2015-04-05 ENCOUNTER — Emergency Department (HOSPITAL_COMMUNITY)
Admission: EM | Admit: 2015-04-05 | Discharge: 2015-04-05 | Disposition: A | Discharge status: Home / Self Care | Payer: 59 | Attending: Emergency Medicine | Admitting: Emergency Medicine

## 2015-04-05 ENCOUNTER — Encounter (HOSPITAL_COMMUNITY): Payer: Self-pay

## 2015-04-05 DIAGNOSIS — Z86711 Personal history of pulmonary embolism: Secondary | ICD-10-CM | POA: Insufficient documentation

## 2015-04-05 DIAGNOSIS — R208 Other disturbances of skin sensation: Secondary | ICD-10-CM | POA: Insufficient documentation

## 2015-04-05 DIAGNOSIS — R42 Dizziness and giddiness: Secondary | ICD-10-CM | POA: Insufficient documentation

## 2015-04-05 DIAGNOSIS — R7989 Other specified abnormal findings of blood chemistry: Secondary | ICD-10-CM

## 2015-04-05 DIAGNOSIS — I10 Essential (primary) hypertension: Secondary | ICD-10-CM | POA: Diagnosis not present

## 2015-04-05 DIAGNOSIS — R0981 Nasal congestion: Secondary | ICD-10-CM | POA: Diagnosis not present

## 2015-04-05 DIAGNOSIS — R05 Cough: Secondary | ICD-10-CM | POA: Insufficient documentation

## 2015-04-05 DIAGNOSIS — F17200 Nicotine dependence, unspecified, uncomplicated: Secondary | ICD-10-CM | POA: Diagnosis not present

## 2015-04-05 DIAGNOSIS — J3489 Other specified disorders of nose and nasal sinuses: Secondary | ICD-10-CM | POA: Insufficient documentation

## 2015-04-05 DIAGNOSIS — R6889 Other general symptoms and signs: Secondary | ICD-10-CM

## 2015-04-05 DIAGNOSIS — R232 Flushing: Secondary | ICD-10-CM | POA: Diagnosis present

## 2015-04-05 HISTORY — DX: Other pulmonary embolism without acute cor pulmonale: I26.99

## 2015-04-05 HISTORY — DX: Essential (primary) hypertension: I10

## 2015-04-05 HISTORY — DX: Other specified health status: Z78.9

## 2015-04-05 HISTORY — DX: Other problems related to lifestyle: Z72.89

## 2015-04-05 HISTORY — DX: Alcohol use, unspecified, uncomplicated: F10.90

## 2015-04-05 LAB — CBC WITH DIFFERENTIAL/PLATELET
BASOS ABS: 0.1 10*3/uL (ref 0.0–0.1)
BASOS PCT: 1 %
Eosinophils Absolute: 0.1 10*3/uL (ref 0.0–0.7)
Eosinophils Relative: 2 %
HEMATOCRIT: 39.8 % (ref 39.0–52.0)
HEMOGLOBIN: 13.3 g/dL (ref 13.0–17.0)
Lymphocytes Relative: 24 %
Lymphs Abs: 1.9 10*3/uL (ref 0.7–4.0)
MCH: 30.6 pg (ref 26.0–34.0)
MCHC: 33.4 g/dL (ref 30.0–36.0)
MCV: 91.7 fL (ref 78.0–100.0)
Monocytes Absolute: 0.5 10*3/uL (ref 0.1–1.0)
Monocytes Relative: 7 %
NEUTROS ABS: 5.3 10*3/uL (ref 1.7–7.7)
NEUTROS PCT: 66 %
Platelets: 192 10*3/uL (ref 150–400)
RBC: 4.34 MIL/uL (ref 4.22–5.81)
RDW: 13.8 % (ref 11.5–15.5)
WBC: 7.8 10*3/uL (ref 4.0–10.5)

## 2015-04-05 LAB — BASIC METABOLIC PANEL
ANION GAP: 12 (ref 5–15)
BUN: 26 mg/dL — ABNORMAL HIGH (ref 6–20)
CHLORIDE: 104 mmol/L (ref 101–111)
CO2: 25 mmol/L (ref 22–32)
Calcium: 8.8 mg/dL — ABNORMAL LOW (ref 8.9–10.3)
Creatinine, Ser: 2.12 mg/dL — ABNORMAL HIGH (ref 0.61–1.24)
GFR calc non Af Amer: 33 mL/min — ABNORMAL LOW (ref >60)
GFR, EST AFRICAN AMERICAN: 39 mL/min — AB (ref >60)
Glucose, Bld: 98 mg/dL (ref 65–99)
Potassium: 5 mmol/L (ref 3.5–5.1)
Sodium: 141 mmol/L (ref 135–145)

## 2015-04-05 LAB — URINE MICROSCOPIC-ADD ON
BACTERIA UA: NONE SEEN
Squamous Epithelial / LPF: NONE SEEN
WBC, UA: NONE SEEN WBC/hpf (ref 0–5)

## 2015-04-05 LAB — URINALYSIS, ROUTINE W REFLEX MICROSCOPIC
BILIRUBIN URINE: NEGATIVE
Glucose, UA: NEGATIVE mg/dL
KETONES UR: NEGATIVE mg/dL
Leukocytes, UA: NEGATIVE
NITRITE: NEGATIVE
PH: 6.5 (ref 5.0–8.0)
PROTEIN: 30 mg/dL — AB
Specific Gravity, Urine: 1.01 (ref 1.005–1.030)

## 2015-04-05 LAB — HEPATIC FUNCTION PANEL
ALT: 21 U/L (ref 17–63)
AST: 26 U/L (ref 15–41)
Albumin: 3.6 g/dL (ref 3.5–5.0)
Alkaline Phosphatase: 74 U/L (ref 38–126)
BILIRUBIN DIRECT: 0.1 mg/dL (ref 0.1–0.5)
BILIRUBIN INDIRECT: 0.5 mg/dL (ref 0.3–0.9)
TOTAL PROTEIN: 7.5 g/dL (ref 6.5–8.1)
Total Bilirubin: 0.6 mg/dL (ref 0.3–1.2)

## 2015-04-05 LAB — ETHANOL

## 2015-04-05 LAB — D-DIMER, QUANTITATIVE: D-Dimer, Quant: 0.56 ug/mL-FEU — ABNORMAL HIGH (ref 0.00–0.50)

## 2015-04-05 LAB — TROPONIN I
Troponin I: <0.03 ng/mL (ref <0.031)
Troponin I: <0.03 ng/mL (ref <0.031)

## 2015-04-05 LAB — RAPID URINE DRUG SCREEN, HOSP PERFORMED
Amphetamines: NOT DETECTED
BENZODIAZEPINES: NOT DETECTED
Barbiturates: NOT DETECTED
COCAINE: NOT DETECTED
Opiates: NOT DETECTED
Tetrahydrocannabinol: NOT DETECTED

## 2015-04-05 LAB — SALICYLATE LEVEL: Salicylate Lvl: <4 mg/dL (ref 2.8–30.0)

## 2015-04-05 LAB — ACETAMINOPHEN LEVEL

## 2015-04-05 MED ORDER — TECHNETIUM TO 99M ALBUMIN AGGREGATED
4.0000 | Freq: Once | INTRAVENOUS | Status: AC | PRN
  Administered 2015-04-05: 3.8 via INTRAVENOUS

## 2015-04-05 MED ORDER — SODIUM CHLORIDE 0.9 % IV SOLN
INTRAVENOUS | Status: DC
  Administered 2015-04-05: 14:00:00 via INTRAVENOUS

## 2015-04-05 MED ORDER — TECHNETIUM TC 99M DIETHYLENETRIAME-PENTAACETIC ACID
30.0000 | Freq: Once | INTRAVENOUS | Status: AC | PRN
  Administered 2015-04-05: 32 via RESPIRATORY_TRACT

## 2015-04-05 NOTE — ED Notes (Signed)
Pt reports approx 65min ago had had sudden onset of light headedness and feeling hot inside his body.  Reports had another episode about 62min ago.  Reports has been taking alkaseltzer, otc cough medication.  Hasn't had alkaseltzer in a couple of days.  Pt says had one swallow of beer this morning also.

## 2015-04-05 NOTE — ED Provider Notes (Signed)
CSN: [id number]     Arrival date & time 04/05/15  1113 History   First MD Initiated Contact with Patient 04/05/15 1216     Chief Complaint  Patient presents with  . Near Syncope      HPI  Pt was seen at 1220. Per pt and his family, c/o sudden onset and resolution of multiple intermittent episodes of "feeling flushed" and "feeling hot inside" since approximately 10am this morning. Pt states his symptoms last for approximately 30 seconds before resolving spontaneously. Has been associated with lightheadedness. Pt states he has "had a cold" for the past week, and has been taking OTC "cold medicines" to treat his symptoms. Denies sore throat, no objective fevers, no abd pain, no N/V/D, no CP/palpitations, no SOB, no rash, no focal motor weakness, no tingling/numbness in extremities, no slurred speech, no facial droop, no ataxia, no visual changes. Denies new meds.    Past Medical History  Diagnosis Date  . Hypertension   . Alcohol use ([place])   . Tobacco use   . Pulmonary embolism ([place]) 2011   Past Surgical History  Procedure Laterality Date  . Cardiac catheterization  2002    normal coronary arteries    Social History  Substance Use Topics  . Smoking status: Current Every Day Smoker  . Smokeless tobacco: None  . Alcohol Use: Yes     Comment: occ    Review of Systems ROS: Statement: All systems negative except as marked or noted in the HPI; Constitutional: Negative for fever and chills. +"felt flushed and hot inside."; ; Eyes: Negative for eye pain, redness and discharge. ; ; ENMT: Negative for ear pain, hoarseness, sore throat. +nasal congestion, sinus pressure and rhinorrhea. ; ; Cardiovascular: Negative for chest pain, palpitations, diaphoresis, dyspnea and peripheral edema. ; ; Respiratory: +cough. Negative for wheezing and stridor. ; ; Gastrointestinal: Negative for nausea, vomiting, diarrhea, abdominal pain, blood in stool, hematemesis, jaundice and rectal bleeding. . ; ;  Genitourinary: Negative for dysuria, flank pain and hematuria. ; ; Musculoskeletal: Negative for back pain and neck pain. Negative for swelling and trauma.; ; Skin: Negative for pruritus, rash, abrasions, blisters, bruising and skin lesion.; ; Neuro: +lightheadedness. Negative for headache and neck stiffness. Negative for weakness, altered level of consciousness , altered mental status, extremity weakness, paresthesias, involuntary movement, seizure and syncope.     Allergies  Review of patient's allergies indicates no known allergies.  Home Medications   Prior to Admission medications   Medication Sig Start Date End Date Taking? Authorizing Provider  aspirin-sod bicarb-citric acid (ALKA-SELTZER) 325 MG TBEF tablet Take 325 mg by mouth every 6 (six) hours as needed.   Yes Historical Provider, MD  OVER THE COUNTER MEDICATION Take 15 mLs by mouth as needed (cough).   Yes Historical Provider, MD   BP 155/88 mmHg  Pulse 76  Temp(Src) 97.5 F (36.4 C) (Oral)  Resp 20  Ht 6\' 2"  (1.88 m)  Wt 165 lb (74.844 kg)  BMI 21.18 kg/m2  SpO2 100%   12:30 Orthostatic Vital Signs AC  Orthostatic Lying  - BP- Lying: 145/105 mmHg ; Pulse- Lying: 76  Orthostatic Sitting - BP- Sitting: 146/103 mmHg ; Pulse- Sitting: 73  Orthostatic Standing at 0 minutes - BP- Standing at 0 minutes: 137/85 mmHg ; Pulse- Standing at 0 minutes: 81      Physical Exam  1225: Physical examination:  Nursing notes reviewed; Vital signs and O2 SAT reviewed;  Constitutional: Well developed, Well nourished, Well hydrated, In no  acute distress; Head:  Normocephalic, atraumatic; Eyes: EOMI, PERRL, No scleral icterus; ENMT: TM's clear bilat. +edemetous nasal turbinates bilat with clear rhinorrhea. Mouth and pharynx without lesions. No tonsillar exudates. No intra-oral edema. No submandibular or sublingual edema. No hoarse voice, no drooling, no stridor. No trismus.  Mouth and pharynx normal, Mucous membranes moist; Neck: Supple, Full  range of motion, No lymphadenopathy; Cardiovascular: Regular rate and rhythm, No gallop; Respiratory: Breath sounds clear & equal bilaterally, No wheezes.  Speaking full sentences with ease, Normal respiratory effort/excursion; Chest: Nontender, Movement normal; Abdomen: Soft, Nontender, Nondistended, Normal bowel sounds; Genitourinary: No CVA tenderness; Extremities: Pulses normal, No tenderness, No edema, No calf edema or asymmetry.; Neuro: AA&Ox3, Major CN grossly intact. No facial droop. Speech clear. No gross focal motor or sensory deficits in extremities.; Skin: Color normal, Warm, Dry.   ED Course  Procedures (including critical care time) Labs Review   Imaging Review  I have personally reviewed and evaluated these images and lab results as part of my medical decision-making.   EKG Interpretation   Date/Time:  Thursday April 05 2015 11:30:33 EST On arrival Ventricular Rate:  72 PR Interval:  138 QRS Duration: 108 QT Interval:  393 QTC Calculation: 430 R Axis:   64 Text Interpretation:  Sinus rhythm RSR' in V1 or V2, right VCD or RVH  Baseline wander Artifact When compared with ECG of 04/06/2010 No  significant change was found Confirmed by [facility]  MD, Itzy Adler [phone]) on  04/05/2015 12:38:09 PM      EKG Interpretation  Date/Time:  Thursday April 05 2015 17:08:56 EST Repeat Ventricular Rate:  72 PR Interval:  147 QRS Duration: 101 QT Interval:  397 QTC Calculation: 434 R Axis:   73 Text Interpretation:  Sinus rhythm RSR' in V1 or V2, right VCD or RVH Since last tracing of earlier today No significant change was found Confirmed by [facility]  MD, Nunzio Cory [phone]) on 04/05/2015 5:15:22 PM         MDM  MDM Reviewed: previous chart, nursing note and vitals Reviewed previous: labs and ECG Interpretation: labs, ECG and x-ray   Results for orders placed or performed during the hospital encounter of 04/05/15  CBC with Differential  Result Value Ref Range   WBC  7.8 4.0 - 10.5 K/uL   RBC 4.34 4.22 - 5.81 MIL/uL   Hemoglobin 13.3 13.0 - 17.0 g/dL   HCT 39.8 39.0 - 52.0 %   MCV 91.7 78.0 - 100.0 fL   MCH 30.6 26.0 - 34.0 pg   MCHC 33.4 30.0 - 36.0 g/dL   RDW 13.8 11.5 - 15.5 %   Platelets 192 150 - 400 K/uL   Neutrophils Relative % 66 %   Neutro Abs 5.3 1.7 - 7.7 K/uL   Lymphocytes Relative 24 %   Lymphs Abs 1.9 0.7 - 4.0 K/uL   Monocytes Relative 7 %   Monocytes Absolute 0.5 0.1 - 1.0 K/uL   Eosinophils Relative 2 %   Eosinophils Absolute 0.1 0.0 - 0.7 K/uL   Basophils Relative 1 %   Basophils Absolute 0.1 0.0 - 0.1 K/uL  Basic metabolic panel  Result Value Ref Range   Sodium 141 135 - 145 mmol/L   Potassium 5.0 3.5 - 5.1 mmol/L   Chloride 104 101 - 111 mmol/L   CO2 25 22 - 32 mmol/L   Glucose, Bld 98 65 - 99 mg/dL   BUN 26 (H) 6 - 20 mg/dL   Creatinine, Ser 2.12 (H) 0.61 -  1.24 mg/dL   Calcium 8.8 (L) 8.9 - 10.3 mg/dL   GFR calc non Af Amer 33 (L) >60 mL/min   GFR calc Af Amer 39 (L) >60 mL/min   Anion gap 12 5 - 15  Troponin I  Result Value Ref Range   Troponin I <0.03 <0.031 ng/mL  Acetaminophen level  Result Value Ref Range   Acetaminophen (Tylenol), Serum <10 (L) 10 - 30 ug/mL  Ethanol  Result Value Ref Range   Alcohol, Ethyl (B) <5 <5 mg/dL  Salicylate level  Result Value Ref Range   Salicylate Lvl 123456 2.8 - 30.0 mg/dL  Urinalysis, Routine w reflex microscopic  Result Value Ref Range   Color, Urine YELLOW YELLOW   APPearance CLEAR CLEAR   Specific Gravity, Urine 1.010 1.005 - 1.030   pH 6.5 5.0 - 8.0   Glucose, UA NEGATIVE NEGATIVE mg/dL   Hgb urine dipstick MODERATE (A) NEGATIVE   Bilirubin Urine NEGATIVE NEGATIVE   Ketones, ur NEGATIVE NEGATIVE mg/dL   Protein, ur 30 (A) NEGATIVE mg/dL   Nitrite NEGATIVE NEGATIVE   Leukocytes, UA NEGATIVE NEGATIVE  Hepatic function panel  Result Value Ref Range   Total Protein 7.5 6.5 - 8.1 g/dL   Albumin 3.6 3.5 - 5.0 g/dL   AST 26 15 - 41 U/L   ALT 21 17 - 63 U/L    Alkaline Phosphatase 74 38 - 126 U/L   Total Bilirubin 0.6 0.3 - 1.2 mg/dL   Bilirubin, Direct 0.1 0.1 - 0.5 mg/dL   Indirect Bilirubin 0.5 0.3 - 0.9 mg/dL  Urine rapid drug screen (hosp performed)  Result Value Ref Range   Opiates NONE DETECTED NONE DETECTED   Cocaine NONE DETECTED NONE DETECTED   Benzodiazepines NONE DETECTED NONE DETECTED   Amphetamines NONE DETECTED NONE DETECTED   Tetrahydrocannabinol NONE DETECTED NONE DETECTED   Barbiturates NONE DETECTED NONE DETECTED  D-dimer, quantitative  Result Value Ref Range   D-Dimer, Quant 0.56 (H) 0.00 - 0.50 ug/mL-FEU  Urine microscopic-add on  Result Value Ref Range   Squamous Epithelial / LPF NONE SEEN NONE SEEN   WBC, UA NONE SEEN 0 - 5 WBC/hpf   RBC / HPF 6-30 0 - 5 RBC/hpf   Bacteria, UA NONE SEEN NONE SEEN  Troponin I  Result Value Ref Range   Troponin I <0.03 <0.031 ng/mL   Dg Chest 2 View 04/05/2015  CLINICAL DATA:  Sudden onset of lightheadedness EXAM: CHEST  2 VIEW COMPARISON:  Chest x-ray dated 05/22/2010. FINDINGS: Heart size is normal. Overall cardiomediastinal silhouette remains normal in size and configuration. Pulmonary vasculature remains normal. Lungs are clear. Slight elevation of the right hemidiaphragm is unchanged. Minimal degenerative change again noted within the thoracic spine. No acute osseous abnormality appreciated. Superficial soft tissues about the chest are unremarkable. IMPRESSION: Stable chest x-ray. No evidence of acute cardiopulmonary abnormality. Electronically Signed   By: Franki Cabot M.D.   On: 04/05/2015 12:40   Nm Pulmonary Perf And Vent 04/05/2015  CLINICAL DATA:  History of prior pulmonary embolus. Elevated D-dimer study. Acute onset dizziness EXAM: NUCLEAR MEDICINE VENTILATION - PERFUSION LUNG SCAN Views: Anterior, posterior, left lateral, right lateral, RPO, LPO, RAO, LAO -ventilation and perfusion Radionuclide: Technetium 10m DTPA-ventilation; Technetium 60m macroaggregated  albumin-perfusion Dose:  [id number] millicurie-ventilation; 3.8 millicurie-perfusion Route of administration: Inhalation -ventilation ; intravenous -perfusion COMPARISON:  Chest radiograph April 05, 2015 FINDINGS: Ventilation: There is decreased attenuation on the lateral views in portions of each upper lobe. Ventilation elsewhere appears unremarkable  and symmetric bilaterally. Perfusion: On the lateral views, there is decreased uptake in the posterior segments of each upper lobe, matching ventilation defects. Elsewhere perfusion appears symmetric inhomogeneous bilaterally. There is no appreciable ventilation/ perfusion mismatch. IMPRESSION: Matching ventilation and perfusion posterior segment upper lobe defects bilaterally, appreciable primarily on the lateral views. There appears to be a degree of emphysematous change in the upper lobes on chest radiograph. There is no appreciable ventilation/perfusion mismatch. These findings constitute an overall low probability of pulmonary embolus. Electronically Signed   By: Lowella Grip III M.D.   On: 04/05/2015 17:13      Results for HARTZELL, RICCIARDELLI (MRN [id number]) as of 04/05/2015 12:57  Ref. Range 04/20/2010 17:00 04/21/2010 04:20 04/22/2010 05:21 04/05/2015 11:35  BUN Latest Ref Range: 6-20 mg/dL 36 (H) 40 (H) 51 (H) 26 (H)  Creatinine Latest Ref Range: 0.61-1.24 mg/dL 7.85 (H) 8.51 (H) 9.64 (H) 2.12 (H)    1225:  Pt states he has had "multiple episodes" of "feeling flushed" and "hot inside" since arrival to the ED. No change in exam, VS or monitor noted. Workup in progress.   1800:  Pt has been watching TV and talking with family while in the ED.  VSS, resps easy, abd benign, neuro exam intact/unchanged. Doubt PE as cause for symptoms with low prob V/Q scan.  Doubt ACS as cause for symptoms with normal troponin x2 and unchanged EKG from previous x2. No clear indication for admission at this time. Pt and family would like to go home now. Dx and testing d/w  pt and family.  Questions answered.  Verb understanding, agreeable to d/c home with outpt f/u.    Francine Graven, DO 04/09/15 1359

## 2015-04-05 NOTE — Discharge Instructions (Signed)
°Emergency Department Resource Guide °1) Find a Doctor and Pay Out of Pocket °Although you won't have to find out who is covered by your insurance plan, it is a good idea to ask around and get recommendations. You will then need to call the office and see if the doctor you have chosen will accept you as a new patient and what types of options they offer for patients who are self-pay. Some doctors offer discounts or will set up payment plans for their patients who do not have insurance, but you will need to ask so you aren't surprised when you get to your appointment. ° °2) Contact Your Local Health Department °Not all health departments have doctors that can see patients for sick visits, but many do, so it is worth a call to see if yours does. If you don't know where your local health department is, you can check in your phone book. The CDC also has a tool to help you locate your state's health department, and many state websites also have listings of all of their local health departments. ° °3) Find a Walk-in Clinic °If your illness is not likely to be very severe or complicated, you may want to try a walk in clinic. These are popping up all over the country in pharmacies, drugstores, and shopping centers. They're usually staffed by nurse practitioners or physician assistants that have been trained to treat common illnesses and complaints. They're usually fairly quick and inexpensive. However, if you have serious medical issues or chronic medical problems, these are probably not your best option. ° °No Primary Care Doctor: °- Call [facility] at  [phone] - they can help you locate a primary care doctor that  accepts your insurance, provides certain services, etc. °- Physician Referral Service- 1-800-533-3463 ° °Chronic Pain Problems: °Organization         Address  Phone   Notes  °[facility]  ([phone]) [phone] Patients need to be referred by their primary care doctor.  ° °Medication  Assistance: °Organization         Address  Phone   Notes  °[place] [facility] [address] °[place], [place] [address] (336) 641-8030 --Must be a resident of [place] °-- Must have NO insurance coverage whatsoever (no Medicaid/ Medicare, etc.) °-- The pt. MUST have a primary care doctor that directs their care regularly and follows them in the community °  °[facility]  (866) 331-1348   °[employer]  (888) 892-1162   ° °Agencies that provide inexpensive medical care: °Organization         Address  Phone   Notes  °[facility]  (336) 832-8035   °[facility]    (336) 832-7272   °[facility] [address] °[place], [place] [address] (336) 832-4777   °[facility] [address], °[place] (336) 271-4999   °[facility]    (336) 373-0678   °[facility]    (336) 272-1050   °[facility] ° [address],  Phone:  (336) 832-4444, Fax:  (336) 832-4440 Hours of Operation:  9 am - 6 pm, M-F.  Also accepts Medicaid/Medicare and self-pay.  °[facility] ° [address],  Phone: (336) [phone], Fax: (336) [phone]. Hours of Operation:  8:30 am - 5:30 pm, M-F.  Also accepts Medicaid and self-pay.  °[facility] [place] [address], [place] Phone: (336) 878-6027   °[facility] [address], [place], [place] (336)723-1848, Ext. 123 Mondays & Thursdays: 7-9 AM.  First 15 patients are seen on a first come, first serve basis. °  ° °Medicaid-accepting [place] Providers: ° °Organization         Address  Phone   Notes  °[facility] [address], [place] (336) 641-2100 Also accepts self-pay patients.  °[facility] [address], [place] ° (336) [phone]   °[facility] [address], [place]  (336) 288-8857   °[facility] [address] [place] [address], [place] (336) 299-7000   °Veita Bland [address], [place]  ° (336) [phone] Only accepts [place] Access Medicaid patients after they have their name applied to their card.  ° °Self-Pay (no insurance) in [place]: ° °Organization         Address  Phone   Notes  °Sickle Cell Patients, [facility] [address], [place] (336) 832-1970   °[facility] [address], [place] (336) 832-4400   °[facility] ° [address], [place] (336) 992-4800   °[facility]/Dr. Osei-Bonsu ° [address] [place] [address], [place] or [address], [place] (336) [phone] Phone number for both [place] and [place] locations is the same.  °[facility] [address], [place] (336) 299-0000   °[facility] [address] [place] [address], [place] or [address] (336) 852-7530 °(336) 878-2260   °[facility] [address], [place] (336) 350-1642, phone; (336) 294-5005, fax Sees patients 1st and 3rd Saturday of every month.  Must not qualify for public or private insurance (i.e. Medicaid, Medicare, [place] Health Choice, Veterans' Benefits) • Household income should be no more than 200% of the poverty level •The clinic cannot treat you if you are pregnant or think you are pregnant • Sexually transmitted diseases are not treated at the clinic.  ° ° °Dental Care: °Organization         Address  Phone  Notes  °[place] [facility] [address], [place] (336) 641-6152 Accepts children up to age 21 who are enrolled in Medicaid or [place] Health Choice; pregnant women with a Medicaid card; and children who have applied for Medicaid or [place] Health Choice, but were declined, whose parents can pay a reduced fee at time of service.  °[place]  [facility] [place]  [address], [place] (336) 641-7733 Accepts children up to age 21 who are enrolled in Medicaid or [place] Health Choice; pregnant women with a Medicaid card; and children who have applied for Medicaid or [place] Health Choice, but were declined, whose parents can pay a reduced fee at time of service.  °[facility] ° [address], [place] (336) 641-4533 Patients are seen by appointment only. Walk-ins are not accepted. [facility] will see patients 18 years of age and older. °Monday - Tuesday (8am-5pm) °Most Wednesdays (8:30-5pm) °$30 per visit, cash only  °[facility] ° [address], [place] (336) 641-4533 Patients are seen by appointment only. Walk-ins are not accepted. [facility] will see patients 18 years of age and older. °One   Wednesday Evening (Monthly: Volunteer Based).  $30 per visit, cash only  °[facility]  (919) 537-3737 for adults; Children under age 4, call Graduate Pediatric Dentistry at (919) [phone]. Children aged 4-14, please call ([phone]) [phone] to request a pediatric application. ° Dental services are provided in all areas of dental care including fillings, crowns and bridges, complete and partial dentures, implants, gum treatment, root canals, and extractions. Preventive care is also provided. Treatment is provided to both adults and children. °Patients are selected via a lottery and there is often a waiting list. °  °[facility] [address], °[place] ° (336) 763-8833 [email or web address].[email or web address] °  °[facility] [address], [place], [place] (336)723-1848, Ext. 123 Second and Fourth Thursday of each month, opens at 6:30 AM; Clinic ends at 9 AM.  Patients are seen on a first-come first-served basis, and a limited number are seen during each clinic.  ° °[facility] ° [address], [place], [place] (336) [phone]    Eligibility Requirements °You must have lived in [place], [place], or [place] counties for at least the last three months. °  You cannot be eligible for state or federal sponsored healthcare insurance, including Veterans Administration, Medicaid, or Medicare. °  You generally cannot be eligible for healthcare insurance through your employer.  °  How to apply: °Eligibility screenings are held every Tuesday and Wednesday afternoon from 1:00 pm until 4:00 pm. You do not need an appointment for the interview!  °[facility] [address], [place], [place] 336-631-2330   °[facility]  336-342-8273   °[facility]  336-703-3100   °[facility]  336-570-6415   ° °Behavioral Health Resources in the Community: °Intensive Outpatient Programs °Organization         Address  Phone  Notes  °[facility] [address], [place], [place] 336-878-6098   °[facility] [address], [place], [place] 336-832-9800   °[facility] [address], [place], [place] ° 336-882-2125   °[facility] [address],  °[place], [place] 1-800-853-5163 or 336-641-4981   °Substance Abuse Resources °Organization         Address  Phone  Notes  °[facility]  336-882-2125   °[facility]  336-784-9470   °[facility]  336-285-9073   °[facility]  336-845-3988   °Residential & Outpatient Substance Abuse Program  1-800-659-3381   °Psychological Services °Organization         Address  Phone  Notes  °[facility]  336- 832-9600   °[facility]  336- 378-7881   °[facility] [address], [place] 1-800-853-5163 or 336-641-4981   ° °Mobile Crisis Teams °Organization         Address  Phone  Notes  °[facility], Mobile Crisis Care Unit  1-877-626-1772   °Assertive °[facility] ° [address]  [place], [place] 336-834-9664   °Sharon DeEsch [address] °[place] 336-554-5454   ° °Self-Help/Support Groups °Organization         Address  Phone             Notes  °[facility]  - variety of support groups  336- 373-1402 Call for more information  °Narcotics Anonymous (NA), [facility] [address], °[place]  2 meetings at this location  ° °  Residential Treatment Programs Organization         Address  Phone  Notes  [facility] [address],    [place]  1-(717)161-7532   [facility]  [address] [place] [id number], [place], [place]   [place], [place] [phone] Admissions: 8am-3pm M-F  [facility] [place] [address]    [place], [place] [id number]   [facility] [address] [place], [place], [place]   [facility] [address],  [place], [place]   [facility] Intensive Outpatient [address], [place], [place]   [facility] [address]  [place], [place] 1-253-830-6744 or 414-522-4056   [facility] [address], [place], [place] Accepts Medicaid  [facility] [address],  [place] 1-831-078-3428 Substance Abuse/Addiction Treatment   [facility] Organization         Address  Phone  Notes  [facility]  814-803-9142   Domenic Schwab, PhD [address] [place], [place]   365-124-2528 or 828-648-9527   [facility], [place] [phone]   [facility] [address] [place], [place] 351-374-6738 Insurance/Medicaid/sponsorship through [facility] and Families [address]                                    [place], [place] [phone] [place] [address], [place] [phone]    Dr. Adele Schilder  (705)[phone]   [facility]. 1) [address]  2) [place] 3)  [address], [place] [phone]  [phone]   [place] [phone] or ([phone] (After Hours)      Take your usual prescriptions as previously directed.  Call your regular medical doctor tomorrow to schedule a follow up appointment within the next 3 days.  Return to the Emergency Department immediately sooner if worsening.

## 2015-04-10 ENCOUNTER — Other Ambulatory Visit (HOSPITAL_COMMUNITY): Payer: Self-pay | Admitting: Physician Assistant

## 2015-04-10 ENCOUNTER — Other Ambulatory Visit (HOSPITAL_COMMUNITY): Payer: Self-pay | Admitting: Family Medicine

## 2015-04-10 DIAGNOSIS — M6250 Muscle wasting and atrophy, not elsewhere classified, unspecified site: Secondary | ICD-10-CM

## 2015-04-10 DIAGNOSIS — G119 Hereditary ataxia, unspecified: Secondary | ICD-10-CM

## 2015-04-11 ENCOUNTER — Ambulatory Visit (HOSPITAL_COMMUNITY)
Admission: RE | Admit: 2015-04-11 | Discharge: 2015-04-11 | Disposition: A | Discharge status: Home / Self Care | Payer: 59 | Source: Ambulatory Visit | Attending: Physician Assistant | Admitting: Physician Assistant

## 2015-04-11 DIAGNOSIS — R270 Ataxia, unspecified: Secondary | ICD-10-CM | POA: Diagnosis not present

## 2015-04-11 DIAGNOSIS — J320 Chronic maxillary sinusitis: Secondary | ICD-10-CM | POA: Insufficient documentation

## 2015-04-11 DIAGNOSIS — J322 Chronic ethmoidal sinusitis: Secondary | ICD-10-CM | POA: Diagnosis not present

## 2015-04-11 DIAGNOSIS — G119 Hereditary ataxia, unspecified: Secondary | ICD-10-CM

## 2015-04-11 DIAGNOSIS — M6250 Muscle wasting and atrophy, not elsewhere classified, unspecified site: Secondary | ICD-10-CM

## 2015-04-11 MED ORDER — DIAZEPAM 5 MG PO TABS
10.0000 mg | ORAL_TABLET | Freq: Once | ORAL | Status: AC
  Administered 2015-04-11: 10 mg via ORAL
  Filled 2015-04-11: qty 2

## 2015-04-25 ENCOUNTER — Ambulatory Visit (INDEPENDENT_AMBULATORY_CARE_PROVIDER_SITE_OTHER): Payer: 59 | Admitting: Neurology

## 2015-04-25 ENCOUNTER — Telehealth: Payer: Self-pay | Admitting: Neurology

## 2015-04-25 ENCOUNTER — Encounter: Payer: Self-pay | Admitting: Neurology

## 2015-04-25 VITALS — BP 132/78 | HR 78 | Resp 16 | Ht 74.0 in | Wt 166.0 lb

## 2015-04-25 DIAGNOSIS — R208 Other disturbances of skin sensation: Secondary | ICD-10-CM

## 2015-04-25 DIAGNOSIS — R29898 Other symptoms and signs involving the musculoskeletal system: Secondary | ICD-10-CM

## 2015-04-25 DIAGNOSIS — M6250 Muscle wasting and atrophy, not elsewhere classified, unspecified site: Secondary | ICD-10-CM | POA: Diagnosis not present

## 2015-04-25 MED ORDER — LORAZEPAM 0.5 MG PO TABS: ORAL_TABLET | ORAL | Status: DC

## 2015-04-25 NOTE — Telephone Encounter (Signed)
I have spoken with Steven Sellers--she sts. mri is this afternoon between 5-5:30pm, and Steven Sellers needs sedation.  Per RAS, rx. for Ativan 0.5mg  #2 faxed to [facility]

## 2015-04-25 NOTE — Progress Notes (Signed)
[facility]  PATIENT: Steven Sellers DOB: [birth year]  REFERRING DOCTOR OR PCP:  Rowan Blase, PA [employer], Linna Hoff) SOURCE: Patient, EMR ED visit and Brain MRI on PACS.    _________________________________   HISTORICAL  CHIEF COMPLAINT:  Chief Complaint  Patient presents with  . Abnormal MRI    Trigger is here with his wife [name] and dtr. [name] for eval of generalized weakness onset 4-5 yrs. ago, and numbness bilat feet, burning sensation top of left foot only, onset 1-2 yrs. ago.  Recent abnormal MRI brain; sts. pcp referred him to Dr. Felecia Shelling for r/o MS./fim  . Extremity Weakness    HISTORY OF PRESENT ILLNESS:  I had the pleasure of seeing your patient, Steven Sellers, at [facility] for a neurologic consultation regarding his generalized weakness. Is a 54 year old man who began to notice weakness in arms and legs about 5 years ago. By 4 years ago, he would have difficulty getting himself up from the floor if he was down doing something for work. Shortly after that he began to have difficulty with climbing stair.   Also he would notice weakness in the shoulders and the arms. His grip weekend. Strength has continued to decline over the past few years. Currently, he is unable to get himself up from the floor. He can barely climb one flight of stairs. He is unable to climb a ladder. He can barely lift the arms over the head. He began to have difficulty using a screwdriver 3 or 4 years ago and now can not do that.    5 years ago, he could easily lift over 100 pounds over his head.  He also has noted weight loss and some muscle atrophy. He has lost about 40 or 45 pounds over the last 5 years. This weight loss seems to have been muscle as he has noted a lot of atrophy. He has had twitching in some of the muscles. He started have twitching in the face muscles about 3 years ago. He has also noted twitching in some of the muscles of the hands and  elsewhere. While he is asleep, he will sometimes have large jerks that the wife notes.  He also has noted a burning sensation with numbness in both feet.   This is worse on the left, especially the top of the left foot. This began one or 2 years ago. He recently went to the emergency room. He had an MRI of the brain performed. I reviewed the images. He has a few nonspecific T2/FLAIR hyperintense foci in the hemispheres and one of the cerebellar hemispheres. These are mild and are likely consistent with minimal chronic microvascular ischemic change and unrelated to his current symptoms.  He has noted mild urinary urgency and frequency. He has not had any incontinence. He has nocturia 3 times nightly. He has not had any major cognitive dysfunction. There are times where he feels he is more forgetful.  REVIEW OF SYSTEMS: Constitutional: No fevers, chills, sweats, or change in appetite.    He has fatigue.   He sleeps well Eyes: No visual changes, double vision, eye pain.   Wears reading glasses Ear, nose and throat: No hearing loss, ear pain, nasal congestion, sore throat Cardiovascular: No chest pain, palpitations Respiratory: No shortness of breath at rest or with exertion.   No wheezes GastrointestinaI: No nausea, vomiting, diarrhea, abdominal pain, fecal incontinence Genitourinary: No dysuria, urinary retention or frequency.  No nocturia. Musculoskeletal: No neck pain, back pain  Integumentary: No rash, pruritus, skin lesions Neurological: as above Psychiatric: No depression at this time.  No anxiety Endocrine: No palpitations, diaphoresis, change in appetite, change in weigh or increased thirst Hematologic/Lymphatic: No anemia, purpura, petechiae. Allergic/Immunologic: No itchy/runny eyes, nasal congestion, recent allergic reactions, rashes  ALLERGIES: No Known Allergies  HOME MEDICATIONS:  Current outpatient prescriptions:  .  amLODipine-valsartan (EXFORGE) 10-320 MG tablet, Take 1  tablet by mouth daily., Disp: , Rfl:  .  naproxen sodium (ANAPROX) 220 MG tablet, Take 220 mg by mouth 2 (two) times daily with a meal., Disp: , Rfl:   PAST MEDICAL HISTORY: Past Medical History  Diagnosis Date  . Hypertension   . Alcohol use ([place])   . Tobacco use   . Pulmonary embolism ([place]) 2011  . Vision abnormalities     PAST SURGICAL HISTORY: Past Surgical History  Procedure Laterality Date  . Cardiac catheterization  2002    normal coronary arteries    FAMILY HISTORY: Family History  Problem Relation Age of Onset  . Hypertension Mother   . Heart attack Father     SOCIAL HISTORY:  Social History   Social History  . Marital Status: Married    Spouse Name: N/A  . Number of Children: N/A  . Years of Education: N/A   Occupational History  . Not on file.   Social History Main Topics  . Smoking status: Current Every Day Smoker  . Smokeless tobacco: Not on file  . Alcohol Use: Yes     Comment: occ  . Drug Use: No  . Sexual Activity: Not on file   Other Topics Concern  . Not on file   Social History Narrative     PHYSICAL EXAM  Filed Vitals:   04/25/15 0858  BP: 132/78  Pulse: 78  Resp: 16  Height: 6\' 2"  (1.88 m)  Weight: 166 lb (75.297 kg)    Body mass index is 21.3 kg/(m^2).   General: The patient is well-developed and well-nourished and in no acute distress  Eyes:  Funduscopic exam shows normal optic discs and retinal vessels.  Neck: The neck is supple, no carotid bruits are noted.  The neck is nontender.  Cardiovascular: The heart has a regular rate and rhythm with a normal S1 and S2. There were no murmurs, gallops or rubs. Lungs are clear to auscultation.  Skin: Extremities are without significant edema.  Musculoskeletal:  Back is nontender  Neurologic Exam  Mental status: The patient is alert and oriented x 3 at the time of the examination. The patient has apparent normal recent and remote memory, with an apparently normal  attention span and concentration ability.   Speech is normal.  Cranial nerves: Extraocular movements are full. Pupils are equal, round, and reactive to light and accomodation.  Visual fields are full.  Facial symmetry is present.  Fasciculations were seen in the cheek.  There is good facial sensation to soft touch bilaterally.Facial strength is normal.  Trapezius and sternocleidomastoid strength is normal. No dysarthria is noted.  The tongue is midline, and the patient has symmetric elevation of the soft palate. No obvious hearing deficits are noted.  Motor:  He has reduced muscle bulk proximally and distally in the arms and proximally in the legs. Muscle tone is normal. Strength is 3/5 for arm elevation over his head, 4 -/5 in other proximal arm muscles and 4 - to 4/5 in the hands strength is 4/5 in both legs.  Sensory: Sensory testing is intact to pinprick, soft  touch and vibration sensation in all 4 extremities.  Coordination: Cerebellar testing reveals good finger-nose-finger and heel-to-shin bilaterally.  Gait and station: Station is normal.   Gait is normal. Tandem gait is normal. Romberg is negative.   Reflexes: Deep tendon reflexes are absent in the arms and legs bilaterally.   Plantar responses are flexor.    DIAGNOSTIC DATA (LABS, IMAGING, TESTING) - I reviewed patient records, labs, notes, testing and imaging myself where available.  Lab Results  Component Value Date   WBC 7.8 04/05/2015   HGB 13.3 04/05/2015   HCT 39.8 04/05/2015   MCV 91.7 04/05/2015   PLT 192 04/05/2015      Component Value Date/Time   NA 141 04/05/2015 1135   K 5.0 04/05/2015 1135   CL 104 04/05/2015 1135   CO2 25 04/05/2015 1135   GLUCOSE 98 04/05/2015 1135   BUN 26* 04/05/2015 1135   CREATININE 2.12* 04/05/2015 1135   CALCIUM 8.8* 04/05/2015 1135   PROT 7.5 04/05/2015 1135   ALBUMIN 3.6 04/05/2015 1135   AST 26 04/05/2015 1135   ALT 21 04/05/2015 1135   ALKPHOS 74 04/05/2015 1135   BILITOT  0.6 04/05/2015 1135   GFRNONAA 33* 04/05/2015 1135   GFRAA 39* 04/05/2015 1135   No results found for: CHOL, HDL, LDLCALC, LDLDIRECT, TRIG, CHOLHDL Lab Results  Component Value Date   HGBA1C  04/16/2010    5.5 (NOTE)                                                                       According to the ADA Clinical Practice Recommendations for 2011, when HbA1c is used as a screening test:   >=6.5%   Diagnostic of Diabetes Mellitus           (if abnormal result  is confirmed)  5.7-6.4%   Increased risk of developing Diabetes Mellitus  References:Diagnosis and Classification of Diabetes Mellitus,Diabetes Care,2011,34(Suppl 1):S62-S69 and Standards of Medical Care in         Diabetes - 2011,Diabetes Care,2011,34  (Suppl 1):S11-S61.        ASSESSMENT AND PLAN  Muscle atrophy - Plan: GM1 Antibody IgG, IgM, CK, Myasthenia Gravis Full Panel, MR Cervical Spine Wo Contrast, MR Lumbar Spine Wo Contrast, NCV with EMG(electromyography)  Arm weakness - Plan: GM1 Antibody IgG, IgM, CK, Myasthenia Gravis Full Panel, MR Cervical Spine Wo Contrast, NCV with EMG(electromyography)  Weakness of both lower extremities - Plan: GM1 Antibody IgG, IgM, CK, Myasthenia Gravis Full Panel, MR Cervical Spine Wo Contrast, MR Lumbar Spine Wo Contrast, NCV with EMG(electromyography)  Dysesthesia   In summary, Mr. Bankes is a 54 year old man who has a five-year history of progressive muscle weakness with atrophy. I am most concerned that he has a motor neuron disease, possibly progressive muscular atrophy, which is a lower motor neuron predominant disease related to ALS. On his exam today, I did not detect any upper motor neuron signs making classic ALS less likely. We need to rule out multifocal motor neuropathy and cervical stenosis, especially as his symptoms are worse in the arms and legs. I will check anti-GM 1, creatine kinase and set up a nerve conduction and EMG study. We will also check MRI of the cervical and  lumbar  spine to rule out severe degenerative changes that could also lead to lower motor neuron signs.  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  I'm going to write him out of work for 1 month while we get this testing done. Depending on results, he may need to go on permanent disability.  I will see him back for the nerve conduction study and we will schedule further follow-up based on results.  Thank you for asking me to see Mr. Guilfoyle for a neurologic consultation. Please let me know if I can be of further assistance with him or other patients in the future.   Richard A. Felecia Shelling, MD, PhD [id number], [id number] AM Certified in Neurology, Clinical Neurophysiology, Sleep Medicine, Pain Medicine and Neuroimaging  [facility] [address] [place], [place] [address] [phone]

## 2015-04-25 NOTE — Telephone Encounter (Signed)
Patient is calling. Forestine Na is trying to work the patient in this afternoon for an MRI but the patient needs something called in to calm him down before having the MRI. Please call to [place] [facility] and advise the patient too. Thank you.

## 2015-04-26 ENCOUNTER — Ambulatory Visit (HOSPITAL_COMMUNITY)
Admission: RE | Admit: 2015-04-26 | Discharge: 2015-04-26 | Disposition: A | Discharge status: Home / Self Care | Payer: 59 | Source: Ambulatory Visit | Attending: Neurology | Admitting: Neurology

## 2015-04-26 ENCOUNTER — Other Ambulatory Visit (HOSPITAL_COMMUNITY): Payer: 59

## 2015-04-26 DIAGNOSIS — R29898 Other symptoms and signs involving the musculoskeletal system: Secondary | ICD-10-CM | POA: Diagnosis not present

## 2015-04-26 DIAGNOSIS — M5137 Other intervertebral disc degeneration, lumbosacral region: Secondary | ICD-10-CM | POA: Diagnosis not present

## 2015-04-26 DIAGNOSIS — M6250 Muscle wasting and atrophy, not elsewhere classified, unspecified site: Secondary | ICD-10-CM | POA: Diagnosis not present

## 2015-04-26 DIAGNOSIS — M5127 Other intervertebral disc displacement, lumbosacral region: Secondary | ICD-10-CM | POA: Diagnosis not present

## 2015-04-26 DIAGNOSIS — M50222 Other cervical disc displacement at C5-C6 level: Secondary | ICD-10-CM | POA: Insufficient documentation

## 2015-05-02 ENCOUNTER — Encounter: Payer: Self-pay | Admitting: Neurology

## 2015-05-02 ENCOUNTER — Ambulatory Visit (INDEPENDENT_AMBULATORY_CARE_PROVIDER_SITE_OTHER): Payer: 59 | Admitting: Neurology

## 2015-05-02 ENCOUNTER — Other Ambulatory Visit: Payer: Self-pay | Admitting: *Deleted

## 2015-05-02 ENCOUNTER — Encounter (INDEPENDENT_AMBULATORY_CARE_PROVIDER_SITE_OTHER): Payer: Self-pay | Admitting: Neurology

## 2015-05-02 DIAGNOSIS — R29898 Other symptoms and signs involving the musculoskeletal system: Secondary | ICD-10-CM | POA: Diagnosis not present

## 2015-05-02 DIAGNOSIS — G7109 Other specified muscular dystrophies: Secondary | ICD-10-CM

## 2015-05-02 DIAGNOSIS — G71039 Limb girdle muscular dystrophy, unspecified: Secondary | ICD-10-CM

## 2015-05-02 DIAGNOSIS — G710 Muscular dystrophy: Secondary | ICD-10-CM | POA: Diagnosis not present

## 2015-05-02 DIAGNOSIS — Z0289 Encounter for other administrative examinations: Secondary | ICD-10-CM

## 2015-05-02 DIAGNOSIS — M6250 Muscle wasting and atrophy, not elsewhere classified, unspecified site: Secondary | ICD-10-CM

## 2015-05-02 NOTE — Addendum Note (Signed)
Addended by: Britt Bottom on: 05/02/2015 06:51 PM   Modules accepted: Level of Service

## 2015-05-02 NOTE — Progress Notes (Signed)
   STUDY DATE: 05/02/2015 PATIENT NAME: Steven Sellers DOB: [birth year] MRN: [id number]  HISTORY:  [name] is a 53 year old man who has had progressive weakness in the proximal more than distal arms and legs for the past 5 years. He has had severe atrophy of the proximal limb muscles.  NERVE CONDUCTION STUDIES:  Median motor responses were mildly long-term in latencies and mildly reduced in amplitude bilaterally.   Forearm conduction was normal.  Median F-wave responses were normal.   Bilateral median sensory responses were moderately prolonged in latency with normal amplitudes     Ulnar motor and sensory responses were normal bilaterally.  Tibial motor responses had normal latency, amplitude and conduction velocity bilaterally.   F-wave responses were normal  Left peroneal motor responses had normal latency, velocity and F-wave responses. Amplitudes were low normal the right peroneal motor responses were not responsive.  Left superficial peroneal sensory responses were normal. The right superficial sensory peroneal response was not responsive.  EMG STUDIES:  EMG of selected muscles of the right arm, right leg and right chin was performed.   Short polyphasic rapidly recruiting myopathic motor units  were observed in all muscles, much worse in the deltoid, biceps and quadriceps.   A few fibrillation potentials were noted in the deltoid, biceps and EDC all muscle and the vastus lateralis. Myopathic units without continuous activity were also seen in the chin  IMPRESSION:  1.    The EMG is consistent with a myopathy, worse in the proximal arm and leg.   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 2.    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    There also appears to be a superimposed right peroneal neuropathy.  4.    There is no evidence of a generalized polyneuropathy or motor neuron disease.   [name], MD, PhD Certified in Neurology, [place] Neurophysiology, Sleep Medicine, Pain Medicine and Neuroimaging  [facility] [address], [place], [place] [address] [phone]  ADDENDUM:     He will have a genetic evaluation [name]) for causes of Limb-Girdle Muscular Dystrophy.    I would like him to see a neuromuscular expert at one of the academic centers for confirmation and further suggestions.    -- RAS

## 2015-05-03 LAB — MYASTHENIA GRAVIS FULL PANEL
AChR Binding Ab, Serum: <0.03 nmol/L (ref 0.00–0.24)
ANTI-STRIATION ABS: NEGATIVE
Acetylchol Block Ab: 23 % (ref 0–25)

## 2015-05-03 LAB — GM1 ANTIBODY IGG, IGM: GM 1 IgM: <1:100 {titer}

## 2015-05-03 LAB — CK: Total CK: 482 U/L — ABNORMAL HIGH (ref 24–204)

## 2015-05-09 ENCOUNTER — Telehealth: Payer: Self-pay | Admitting: Neurology

## 2015-05-09 NOTE — Telephone Encounter (Signed)
Butch Penny with [facility] called sts she has pt set up in MD clinic for June 04, 2015 @ 2pm.

## 2015-05-11 ENCOUNTER — Other Ambulatory Visit (HOSPITAL_COMMUNITY): Payer: 59

## 2015-05-12 ENCOUNTER — Encounter (HOSPITAL_COMMUNITY): Payer: Self-pay | Admitting: Emergency Medicine

## 2015-05-12 ENCOUNTER — Observation Stay (HOSPITAL_COMMUNITY)
Admission: EM | Admit: 2015-05-12 | Discharge: 2015-05-13 | Disposition: A | Discharge status: Home / Self Care | Payer: 59 | Attending: Internal Medicine | Admitting: Internal Medicine

## 2015-05-12 DIAGNOSIS — E875 Hyperkalemia: Principal | ICD-10-CM

## 2015-05-12 DIAGNOSIS — Z86711 Personal history of pulmonary embolism: Secondary | ICD-10-CM | POA: Insufficient documentation

## 2015-05-12 DIAGNOSIS — R5383 Other fatigue: Secondary | ICD-10-CM | POA: Diagnosis present

## 2015-05-12 DIAGNOSIS — N183 Chronic kidney disease, stage 3 unspecified: Secondary | ICD-10-CM

## 2015-05-12 DIAGNOSIS — F17210 Nicotine dependence, cigarettes, uncomplicated: Secondary | ICD-10-CM | POA: Insufficient documentation

## 2015-05-12 DIAGNOSIS — H539 Unspecified visual disturbance: Secondary | ICD-10-CM | POA: Diagnosis not present

## 2015-05-12 DIAGNOSIS — N289 Disorder of kidney and ureter, unspecified: Secondary | ICD-10-CM | POA: Diagnosis not present

## 2015-05-12 DIAGNOSIS — I10 Essential (primary) hypertension: Secondary | ICD-10-CM | POA: Diagnosis not present

## 2015-05-12 DIAGNOSIS — Z79899 Other long term (current) drug therapy: Secondary | ICD-10-CM | POA: Insufficient documentation

## 2015-05-12 DIAGNOSIS — R42 Dizziness and giddiness: Secondary | ICD-10-CM | POA: Diagnosis not present

## 2015-05-12 LAB — URINALYSIS, ROUTINE W REFLEX MICROSCOPIC
Bilirubin Urine: NEGATIVE
Glucose, UA: NEGATIVE mg/dL
Ketones, ur: NEGATIVE mg/dL
LEUKOCYTES UA: NEGATIVE
NITRITE: NEGATIVE
PH: 5.5 (ref 5.0–8.0)
Protein, ur: 30 mg/dL — AB
SPECIFIC GRAVITY, URINE: 1.02 (ref 1.005–1.030)

## 2015-05-12 LAB — CBC WITH DIFFERENTIAL/PLATELET
BASOS PCT: 0 %
Basophils Absolute: 0.1 10*3/uL (ref 0.0–0.1)
EOS ABS: 0.1 10*3/uL (ref 0.0–0.7)
Eosinophils Relative: 1 %
HEMATOCRIT: 39.7 % (ref 39.0–52.0)
HEMOGLOBIN: 13.1 g/dL (ref 13.0–17.0)
LYMPHS ABS: 1.2 10*3/uL (ref 0.7–4.0)
Lymphocytes Relative: 10 %
MCH: 30.2 pg (ref 26.0–34.0)
MCHC: 33 g/dL (ref 30.0–36.0)
MCV: 91.5 fL (ref 78.0–100.0)
MONO ABS: 0.6 10*3/uL (ref 0.1–1.0)
MONOS PCT: 5 %
NEUTROS ABS: 10.4 10*3/uL — AB (ref 1.7–7.7)
Neutrophils Relative %: 84 %
Platelets: 212 10*3/uL (ref 150–400)
RBC: 4.34 MIL/uL (ref 4.22–5.81)
RDW: 13.4 % (ref 11.5–15.5)
WBC: 12.3 10*3/uL — ABNORMAL HIGH (ref 4.0–10.5)

## 2015-05-12 LAB — URINE MICROSCOPIC-ADD ON
Bacteria, UA: NONE SEEN
WBC UA: NONE SEEN WBC/hpf (ref 0–5)

## 2015-05-12 LAB — COMPREHENSIVE METABOLIC PANEL
ALBUMIN: 3.7 g/dL (ref 3.5–5.0)
ALK PHOS: 83 U/L (ref 38–126)
ALT: 16 U/L — AB (ref 17–63)
ANION GAP: 6 (ref 5–15)
AST: 18 U/L (ref 15–41)
BUN: 30 mg/dL — ABNORMAL HIGH (ref 6–20)
CALCIUM: 8.6 mg/dL — AB (ref 8.9–10.3)
CHLORIDE: 108 mmol/L (ref 101–111)
CO2: 20 mmol/L — AB (ref 22–32)
CREATININE: 2.24 mg/dL — AB (ref 0.61–1.24)
GFR calc Af Amer: 36 mL/min — ABNORMAL LOW (ref >60)
GFR calc non Af Amer: 31 mL/min — ABNORMAL LOW (ref >60)
GLUCOSE: 92 mg/dL (ref 65–99)
Potassium: 6 mmol/L — ABNORMAL HIGH (ref 3.5–5.1)
SODIUM: 134 mmol/L — AB (ref 135–145)
Total Bilirubin: 0.5 mg/dL (ref 0.3–1.2)
Total Protein: 7.4 g/dL (ref 6.5–8.1)

## 2015-05-12 LAB — CBG MONITORING, ED: GLUCOSE-CAPILLARY: 53 mg/dL — AB (ref 65–99)

## 2015-05-12 LAB — GLUCOSE, CAPILLARY
GLUCOSE-CAPILLARY: 168 mg/dL — AB (ref 65–99)
Glucose-Capillary: 41 mg/dL — CL (ref 65–99)
Glucose-Capillary: 72 mg/dL (ref 65–99)

## 2015-05-12 MED ORDER — ACETAMINOPHEN 650 MG RE SUPP: 650.0000 mg | Freq: Four times a day (QID) | RECTAL | Status: DC | PRN

## 2015-05-12 MED ORDER — SODIUM CHLORIDE 0.9 % IV SOLN
INTRAVENOUS | Status: DC
  Administered 2015-05-12 – 2015-05-13 (×2): via INTRAVENOUS

## 2015-05-12 MED ORDER — SODIUM CHLORIDE 0.9 % IJ SOLN: 3.0000 mL | Freq: Two times a day (BID) | INTRAMUSCULAR | Status: DC

## 2015-05-12 MED ORDER — SODIUM POLYSTYRENE SULFONATE 15 GM/60ML PO SUSP
30.0000 g | Freq: Once | ORAL | Status: AC
  Administered 2015-05-12: 30 g via ORAL
  Filled 2015-05-12: qty 120

## 2015-05-12 MED ORDER — SODIUM CHLORIDE 0.9 % IV BOLUS (SEPSIS)
500.0000 mL | Freq: Once | INTRAVENOUS | Status: AC
  Administered 2015-05-12: 500 mL via INTRAVENOUS

## 2015-05-12 MED ORDER — ALBUTEROL SULFATE (2.5 MG/3ML) 0.083% IN NEBU
10.0000 mg | INHALATION_SOLUTION | Freq: Once | RESPIRATORY_TRACT | Status: AC
  Administered 2015-05-12: 10 mg via RESPIRATORY_TRACT
  Filled 2015-05-12: qty 12

## 2015-05-12 MED ORDER — DEXTROSE 50 % IV SOLN
INTRAVENOUS | Status: AC
  Filled 2015-05-12: qty 50

## 2015-05-12 MED ORDER — SENNOSIDES-DOCUSATE SODIUM 8.6-50 MG PO TABS: 1.0000 | ORAL_TABLET | Freq: Every evening | ORAL | Status: DC | PRN

## 2015-05-12 MED ORDER — ENOXAPARIN SODIUM 40 MG/0.4ML ~~LOC~~ SOLN
40.0000 mg | SUBCUTANEOUS | Status: DC
  Administered 2015-05-12: 40 mg via SUBCUTANEOUS
  Filled 2015-05-12: qty 0.4

## 2015-05-12 MED ORDER — AMLODIPINE BESYLATE 5 MG PO TABS
10.0000 mg | ORAL_TABLET | Freq: Every day | ORAL | Status: DC
  Administered 2015-05-12 – 2015-05-13 (×2): 10 mg via ORAL
  Filled 2015-05-12 (×2): qty 2

## 2015-05-12 MED ORDER — INSULIN ASPART 100 UNIT/ML IV SOLN
6.0000 [IU] | Freq: Once | INTRAVENOUS | Status: AC
  Administered 2015-05-12: 6 [IU] via INTRAVENOUS

## 2015-05-12 MED ORDER — ONDANSETRON HCL 4 MG PO TABS: 4.0000 mg | ORAL_TABLET | Freq: Four times a day (QID) | ORAL | Status: DC | PRN

## 2015-05-12 MED ORDER — SODIUM CHLORIDE 0.9 % IV SOLN
INTRAVENOUS | Status: AC
  Administered 2015-05-12: 16:00:00 via INTRAVENOUS

## 2015-05-12 MED ORDER — DEXTROSE 50 % IV SOLN
1.0000 | Freq: Once | INTRAVENOUS | Status: AC
  Administered 2015-05-12: 50 mL via INTRAVENOUS

## 2015-05-12 MED ORDER — ONDANSETRON HCL 4 MG/2ML IJ SOLN: 4.0000 mg | Freq: Four times a day (QID) | INTRAMUSCULAR | Status: DC | PRN

## 2015-05-12 MED ORDER — SODIUM CHLORIDE 0.9 % IV SOLN
1.0000 g | Freq: Once | INTRAVENOUS | Status: AC
  Administered 2015-05-12: 1 g via INTRAVENOUS
  Filled 2015-05-12 (×2): qty 10

## 2015-05-12 MED ORDER — INSULIN ASPART 100 UNIT/ML ~~LOC~~ SOLN
10.0000 [IU] | Freq: Once | SUBCUTANEOUS | Status: AC
  Administered 2015-05-12: 10 [IU] via SUBCUTANEOUS

## 2015-05-12 MED ORDER — ACETAMINOPHEN 325 MG PO TABS: 650.0000 mg | ORAL_TABLET | Freq: Four times a day (QID) | ORAL | Status: DC | PRN

## 2015-05-12 NOTE — Progress Notes (Signed)
Hypoglycemic Event  CBG: 41  Treatment: 15 GM carbohydrate snack  Symptoms: Sweaty  Follow-up CBG: Time: 2055 CBG Result: 72  Possible Reasons for Event: Medication regimen: insulin  Comments/MD notified: no followed hypoglycemic protocol     Marlane Hatcher

## 2015-05-12 NOTE — ED Notes (Signed)
Pt states that he had an episode at Thanksgiving where he became very hot and sweaty, tingly all over, and very fatigued.  Resolved and this morning at 0930 became sweaty and tingly with generalized weakness.  Denies pain.

## 2015-05-12 NOTE — ED Provider Notes (Signed)
CSN: [id number]     Arrival date & time 05/12/15  1047 History  By signing my name below, I, Rayna Sexton, attest that this documentation has been prepared under the direction and in the presence of Davonna Belling, MD. Electronically Signed: Rayna Sexton, ED Scribe. 05/12/2015. 11:27 AM.   Chief Complaint  Patient presents with  . Fatigue   The history is provided by the patient and the spouse. No language interpreter was used.    HPI Comments: Steven Sellers is a 54 y.o. male with a hx of HTN, smoking and PE who presents to the Emergency Department complaining of an episode of sudden onset, diffuse, tingling at 9:30 am. Pt notes 1 month ago experiencing an episode including diaphoresis, diffuse paraesthesia, near syncopal feeling and severe fatigue which ultimately resolved now noting his episode this morning which he confirms is similar. Pt notes it has happened again since arriving to the hospital but denies currently experiencing the sensation further noting he feels normal. He notes previously taking blood thinners but denies he currently takes them. Pt's wife notes he bleeds easily. He denies a FHx including similar symptoms. Pt notes having an appointment at [facility] on 1/21 which will include a muscle biopsy. Pt notes taking medication for HTN with his last dosage yesterday. He denies any recent n/v/d, change in appetite, CP, SOB or an acute cough.    Past Medical History  Diagnosis Date  . Hypertension   . Alcohol use ([place])   . Tobacco use   . Pulmonary embolism ([place]) 2011  . Vision abnormalities    Past Surgical History  Procedure Laterality Date  . Cardiac catheterization  2002    normal coronary arteries   Family History  Problem Relation Age of Onset  . Hypertension Mother   . Heart attack Father    Social History  Substance Use Topics  . Smoking status: Current Every Day Smoker -- 1.00 packs/day    Types: Cigarettes  . Smokeless tobacco: None  . Alcohol Use:  Yes     Comment: occ    Review of Systems  Constitutional: Positive for diaphoresis and fatigue. Negative for appetite change.  Respiratory: Negative for cough and shortness of breath.   Cardiovascular: Negative for chest pain.  Gastrointestinal: Negative for nausea, vomiting and diarrhea.  Neurological: Positive for light-headedness. Negative for syncope.  Hematological: Bruises/bleeds easily.  All other systems reviewed and are negative.   Allergies  Review of patient's allergies indicates no known allergies.  Home Medications   Prior to Admission medications   Medication Sig Start Date End Date Taking? Authorizing Provider  amLODipine-valsartan (EXFORGE) 10-320 MG tablet Take 1 tablet by mouth daily.   Yes Historical Provider, MD  guaiFENesin (MUCINEX) 600 MG 12 hr tablet Take 1,200 mg by mouth 2 (two) times daily as needed for cough or to loosen phlegm.   Yes Historical Provider, MD   BP 154/86 mmHg  Pulse 100  Temp(Src) 98.2 F (36.8 C) (Oral)  Resp 16  Ht 6\' 2"  (1.88 m)  Wt 165 lb (74.844 kg)  BMI 21.18 kg/m2  SpO2 100%    Physical Exam  Constitutional: He is oriented to person, place, and time. He appears well-developed. No distress.  HENT:  Head: Normocephalic and atraumatic.  Mouth/Throat: No oropharyngeal exudate.  Neck: Normal range of motion. No tracheal deviation present.  Cardiovascular: Normal rate, regular rhythm and normal heart sounds.  Exam reveals no gallop and no friction rub.   No murmur heard. Pulmonary/Chest:  Effort normal and breath sounds normal. No respiratory distress. He has no wheezes. He has no rales.  Abdominal: Soft. There is no tenderness.  Musculoskeletal: Normal range of motion.  Mild muscular atrophy   Neurological: He is alert and oriented to person, place, and time.  Skin: Skin is warm and dry. He is not diaphoretic.  Psychiatric: He has a normal mood and affect. His behavior is normal.  Nursing note and vitals reviewed.   ED  Course  Procedures  DIAGNOSTIC STUDIES: Oxygen Saturation is 100% on RA, normal by my interpretation.    COORDINATION OF CARE: 11:25 AM Discussed next steps with pt and he agreed to the plan.  Labs Review Labs Reviewed  COMPREHENSIVE METABOLIC PANEL - Abnormal; Notable for the following:    Sodium 134 (*)    Potassium 6.0 (*)    CO2 20 (*)    BUN 30 (*)    Creatinine, Ser 2.24 (*)    Calcium 8.6 (*)    ALT 16 (*)    GFR calc non Af Amer 31 (*)    GFR calc Af Amer 36 (*)    All other components within normal limits  CBC WITH DIFFERENTIAL/PLATELET - Abnormal; Notable for the following:    WBC 12.3 (*)    Neutro Abs 10.4 (*)    All other components within normal limits  URINALYSIS, ROUTINE W REFLEX MICROSCOPIC (NOT AT [facility]) - Abnormal; Notable for the following:    Hgb urine dipstick SMALL (*)    Protein, ur 30 (*)    All other components within normal limits  URINE MICROSCOPIC-ADD ON - Abnormal; Notable for the following:    Squamous Epithelial / LPF 0-5 (*)    All other components within normal limits  CBG MONITORING, ED - Abnormal; Notable for the following:    Glucose-Capillary 53 (*)    All other components within normal limits    Imaging Review No results found. I have personally reviewed and evaluated these images and lab results as part of my medical decision-making.   EKG Interpretation   Date/Time:  Saturday May 12 2015 11:03:51 EST Ventricular Rate:  74 PR Interval:  129 QRS Duration: 96 QT Interval:  371 QTC Calculation: 412 R Axis:   61 Text Interpretation:  Sinus rhythm RSR' in V1 or V2, right VCD or RVH T  wave mildly peaked compared to prior No significant change since last  tracing Reconfirmed by [facility]  MD, Ovid Curd [phone]) on 05/12/2015  12:47:38 PM      MDM   Final diagnoses:  Renal insufficiency  Hyperkalemia    Patient with renal insufficiency. Mild hyperkalemia with peaked T waves on EKG. No widened QRS. Unsure of baseline  renal function. Had previous renal failure. Will admit to internal medicine.   CRITICAL CARE Performed by: Mackie Pai Total critical care time: 40minutes Critical care time was exclusive of separately billable procedures and treating other patients. Critical care was necessary to treat or prevent imminent or life-threatening deterioration. Critical care was time spent personally by me on the following activities: development of treatment plan with patient and/or surrogate as well as nursing, discussions with consultants, evaluation of patient's response to treatment, examination of patient, obtaining history from patient or surrogate, ordering and performing treatments and interventions, ordering and review of laboratory studies, ordering and review of radiographic studies, pulse oximetry and re-evaluation of patient's condition.     I personally performed the services described in this documentation, which was scribed in my  presence. The recorded information has been reviewed and is accurate.      Davonna Belling, MD 05/12/15 [phone]

## 2015-05-12 NOTE — ED Notes (Signed)
RT aware of breathing tx. While pushing destrose, pt states he had another episode of feeling flushed, stomach burning and with tingling. HR went from 78-100 and remained in sinus rhythm. NAD.

## 2015-05-12 NOTE — ED Notes (Addendum)
CBG checked with a result of 53. Amp of D50 given per Dr. Alvino Chapel. Dr. Jerilee Hoh paged. Pt tray ordered. Rob Bunting, RN

## 2015-05-12 NOTE — Progress Notes (Signed)
MD ordered medication to lower Pt's potassium level. RN had a question regarding one order on worklist. RN paged/notified MD and MD made RN aware that order was entered in by mistake. MD ordered RN to not give medication. RN will continue to monitor. Oswald Hillock, RN

## 2015-05-12 NOTE — Progress Notes (Signed)

## 2015-05-12 NOTE — H&P (Signed)
[facility]          History and Physical    PCP:   [name], MD   EDP: Davonna Belling, M.D.  Chief Complaint:  Hot all over  HPI: Patient is a 54 year old man who has a history of tobacco abuse, hypertension and a history of pulmonary embolism in 2011, chronic kidney disease at least stage III with a baseline creatinine between 1.8 and 2.1 who presents with the above complaints. He states he has had 2 distinct episodes of this one on Thanksgiving day and one today. He cannot describe it any more than to say he gets "hot all over" and has a tingling sensation all over. 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 at the end of January. Upon arrival to the emergency department, labs were significant for potassium of 6.0, creatinine of 2.24. EKG independently reviewed shows peaked T waves diffusely, no acute ischemic abnormalities. We have been asked to admit him for further evaluation and management.  Allergies:  No Known Allergies    Past Medical History  Diagnosis Date  . Hypertension   . Alcohol use ([place])   . Tobacco use   . Pulmonary embolism ([place]) 2011  . Vision abnormalities     Past Surgical History  Procedure Laterality Date  . Cardiac catheterization  2002    normal coronary arteries    Prior to Admission medications   Medication Sig Start Date End Date Taking? Authorizing Provider  amLODipine-valsartan (EXFORGE) 10-320 MG tablet Take 1 tablet by mouth daily.   Yes Historical Provider, MD  guaiFENesin (MUCINEX) 600 MG 12 hr tablet Take 1,200 mg by mouth 2 (two) times daily as needed for cough or to loosen phlegm.   Yes Historical Provider, MD    Social History:  reports that he has been smoking Cigarettes.  He has been smoking about 1.00 pack per day. He does not have any smokeless tobacco history on file. He reports that he drinks  alcohol. He reports that he does not use illicit drugs.  Family History  Problem Relation Age of Onset  . Hypertension Mother   . Heart attack Father     Review of Systems:  Constitutional: Denies fever, chills, diaphoresis, appetite change and fatigue.  HEENT: Denies photophobia, eye pain, redness, hearing loss, ear pain, congestion, sore throat, rhinorrhea, sneezing, mouth sores, trouble swallowing, neck pain, neck stiffness and tinnitus.   Respiratory: Denies SOB, DOE, cough, chest tightness,  and wheezing.   Cardiovascular: Denies chest pain, palpitations and leg swelling.  Gastrointestinal: Denies nausea, vomiting, abdominal pain, diarrhea, constipation, blood in stool and abdominal distention.  Genitourinary: Denies dysuria, urgency, frequency, hematuria, flank pain and difficulty urinating.  Endocrine: Denies: hot or cold intolerance, sweats, changes in hair or nails, polyuria, polydipsia. Musculoskeletal: Denies myalgias, back pain, joint swelling, arthralgias and gait problem.  Skin: Denies pallor, rash and wound.  Neurological: Denies dizziness, seizures, syncope, weakness, light-headedness, numbness and headaches.  Hematological: Denies adenopathy. Easy bruising, personal or family bleeding history  Psychiatric/Behavioral: Denies suicidal ideation, mood changes, confusion, nervousness, sleep disturbance and agitation   Physical Exam: Blood pressure 154/86, pulse 100, temperature 98.2 F (36.8 C), temperature source Oral, resp. rate 16, height _0  (1.88 m), weight 73.165 kg (161 lb 4.8 oz), SpO2 100 %. General: Alert, awake, oriented 3,  no current distress HEENT: Normocephalic, atraumatic, pupils equal round and reactive to light, extraocular movements intact, moist mucous membranes Neck: Supple, no JVD, no lymphadenopathy, no bruits, no quarter. Cardiovascular: Regular rate and rhythm, no murmurs, rubs or gallops Lungs: Clear to auscultation bilaterally. Abdomen: Soft,  nontender, nondistended, positive bowel sounds, no masses or organomegaly noted. Extremities: Trace bilateral pitting edema, positive pulses. Neurologic: Grossly intact and nonfocal.  Labs on Admission:  Results for orders placed or performed during the hospital encounter of 05/12/15 (from the past 48 hour(s))  Comprehensive metabolic panel     Status: Abnormal   Collection Time: 05/12/15 11:30 AM  Result Value Ref Range   Sodium 134 (L) 135 - 145 mmol/L   Potassium 6.0 (H) 3.5 - 5.1 mmol/L   Chloride 108 101 - 111 mmol/L   CO2 20 (L) 22 - 32 mmol/L   Glucose, Bld 92 65 - 99 mg/dL   BUN 30 (H) 6 - 20 mg/dL   Creatinine, Ser 2.24 (H) 0.61 - 1.24 mg/dL   Calcium 8.6 (L) 8.9 - 10.3 mg/dL   Total Protein 7.4 6.5 - 8.1 g/dL   Albumin 3.7 3.5 - 5.0 g/dL   AST 18 15 - 41 U/L   ALT 16 (L) 17 - 63 U/L   Alkaline Phosphatase 83 38 - 126 U/L   Total Bilirubin 0.5 0.3 - 1.2 mg/dL   GFR calc non Af Amer 31 (L) >60 mL/min   GFR calc Af Amer 36 (L) >60 mL/min    Comment: (NOTE) The eGFR has been calculated using the CKD EPI equation. This calculation has not been validated in all clinical situations. eGFR's persistently <60 mL/min signify possible Chronic Kidney Disease.    Anion gap 6 5 - 15  CBC with Differential     Status: Abnormal   Collection Time: 05/12/15 11:30 AM  Result Value Ref Range   WBC 12.3 (H) 4.0 - 10.5 K/uL   RBC 4.34 4.22 - 5.81 MIL/uL   Hemoglobin 13.1 13.0 - 17.0 g/dL   HCT 39.7 39.0 - 52.0 %   MCV 91.5 78.0 - 100.0 fL   MCH 30.2 26.0 - 34.0 pg   MCHC 33.0 30.0 - 36.0 g/dL   RDW 13.4 11.5 - 15.5 %   Platelets 212 150 - 400 K/uL   Neutrophils Relative % 84 %   Neutro Abs 10.4 (H) 1.7 - 7.7 K/uL   Lymphocytes Relative 10 %   Lymphs Abs 1.2 0.7 - 4.0 K/uL   Monocytes Relative 5 %   Monocytes Absolute 0.6 0.1 - 1.0 K/uL   Eosinophils Relative 1 %   Eosinophils Absolute 0.1 0.0 - 0.7 K/uL   Basophils Relative 0 %   Basophils Absolute 0.1 0.0 - 0.1 K/uL    Urinalysis, Routine w reflex microscopic     Status: Abnormal   Collection Time: 05/12/15  1:08 PM  Result Value Ref Range   Color, Urine YELLOW YELLOW   APPearance CLEAR CLEAR   Specific Gravity, Urine 1.020 1.005 - 1.030   pH 5.5 5.0 - 8.0   Glucose, UA NEGATIVE NEGATIVE mg/dL   Hgb urine dipstick SMALL (A) NEGATIVE   Bilirubin Urine NEGATIVE NEGATIVE   Ketones, ur NEGATIVE NEGATIVE mg/dL   Protein, ur 30 (A) NEGATIVE mg/dL   Nitrite NEGATIVE NEGATIVE   Leukocytes, UA NEGATIVE NEGATIVE  Urine microscopic-add on     Status: Abnormal   Collection Time: 05/12/15  1:08 PM  Result Value Ref Range   Squamous  Epithelial / LPF 0-5 (A) NONE SEEN   WBC, UA NONE SEEN 0 - 5 WBC/hpf   RBC / HPF 0-5 0 - 5 RBC/hpf   Bacteria, UA NONE SEEN NONE SEEN  CBG monitoring, ED     Status: Abnormal   Collection Time: 05/12/15  2:29 PM  Result Value Ref Range   Glucose-Capillary 53 (L) 65 - 99 mg/dL  Glucose, capillary     Status: Abnormal   Collection Time: 05/12/15  3:57 PM  Result Value Ref Range   Glucose-Capillary 168 (H) 65 - 99 mg/dL   Comment 1 Notify RN    Comment 2 Document in Chart     Radiological Exams on Admission: No results found.  Assessment/Plan Principal Problem:   Hyperkalemia Active Problems:   CKD (chronic kidney disease) stage 3, GFR 30-59 ml/min    Hyperkalemia -Unclear if this could be the cause of his presenting complaint. -We'll give 30 g of oral Kayexalate, 10 units of NovoLog followed by D50 as well as 1 amp of calcium gluconate to stabilize the cardiac membrane given peaked T waves evidenced on EKG. -We will recheck potassium level in a.m. -I suspect hyperkalemia is related to ARB in the presence of chronic kidney disease. -We'll need to discontinue indefinitely.  Transient fatigue -Resolved at present. He has had 2 distinct episodes. -Unclear if hyperkalemia could be cause of this. -I believe it may be prudent to set him up with an event monitor upon  discharge to determine if a cardiac arrhythmia could be responsible. -There is also a possibility that his muscular dystrophy may be the reason. -We'll continue outpatient follow-up for this issue.  Hypertension -Continue amlodipine, hold valsartan indefinitely given hyperkalemia and chronic kidney disease.  DVT prophylaxis -Lovenox  CODE STATUS -Full code as discussed with patient and wife [name] at bedside.   Time Spent on Admission: 75 minutes  HERNANDEZ ACOSTA,Gennie Dib [facility] Pager: [phone] 05/12/2015, 5:33 PM

## 2015-05-12 NOTE — ED Notes (Signed)
Attempted to call report

## 2015-05-12 NOTE — ED Notes (Signed)
Attempted report 

## 2015-05-13 DIAGNOSIS — Z86711 Personal history of pulmonary embolism: Secondary | ICD-10-CM | POA: Diagnosis not present

## 2015-05-13 DIAGNOSIS — N289 Disorder of kidney and ureter, unspecified: Secondary | ICD-10-CM | POA: Diagnosis not present

## 2015-05-13 DIAGNOSIS — I10 Essential (primary) hypertension: Secondary | ICD-10-CM | POA: Diagnosis not present

## 2015-05-13 DIAGNOSIS — Z79899 Other long term (current) drug therapy: Secondary | ICD-10-CM | POA: Diagnosis not present

## 2015-05-13 DIAGNOSIS — N183 Chronic kidney disease, stage 3 (moderate): Secondary | ICD-10-CM | POA: Diagnosis not present

## 2015-05-13 DIAGNOSIS — E875 Hyperkalemia: Secondary | ICD-10-CM | POA: Diagnosis not present

## 2015-05-13 DIAGNOSIS — H539 Unspecified visual disturbance: Secondary | ICD-10-CM | POA: Diagnosis not present

## 2015-05-13 DIAGNOSIS — R42 Dizziness and giddiness: Secondary | ICD-10-CM | POA: Diagnosis not present

## 2015-05-13 DIAGNOSIS — F17210 Nicotine dependence, cigarettes, uncomplicated: Secondary | ICD-10-CM | POA: Diagnosis not present

## 2015-05-13 LAB — BASIC METABOLIC PANEL
ANION GAP: 7 (ref 5–15)
BUN: 25 mg/dL — AB (ref 6–20)
CHLORIDE: 115 mmol/L — AB (ref 101–111)
CO2: 19 mmol/L — ABNORMAL LOW (ref 22–32)
Calcium: 8.3 mg/dL — ABNORMAL LOW (ref 8.9–10.3)
Creatinine, Ser: 1.87 mg/dL — ABNORMAL HIGH (ref 0.61–1.24)
GFR calc Af Amer: 45 mL/min — ABNORMAL LOW (ref >60)
GFR calc non Af Amer: 39 mL/min — ABNORMAL LOW (ref >60)
GLUCOSE: 88 mg/dL (ref 65–99)
POTASSIUM: 4.5 mmol/L (ref 3.5–5.1)
Sodium: 141 mmol/L (ref 135–145)

## 2015-05-13 LAB — GLUCOSE, CAPILLARY
GLUCOSE-CAPILLARY: 95 mg/dL (ref 65–99)
Glucose-Capillary: 92 mg/dL (ref 65–99)

## 2015-05-13 LAB — CBC
HEMATOCRIT: 35.6 % — AB (ref 39.0–52.0)
HEMOGLOBIN: 11.6 g/dL — AB (ref 13.0–17.0)
MCH: 30.3 pg (ref 26.0–34.0)
MCHC: 32.6 g/dL (ref 30.0–36.0)
MCV: 93 fL (ref 78.0–100.0)
Platelets: 189 10*3/uL (ref 150–400)
RBC: 3.83 MIL/uL — ABNORMAL LOW (ref 4.22–5.81)
RDW: 13.7 % (ref 11.5–15.5)
WBC: 6.9 10*3/uL (ref 4.0–10.5)

## 2015-05-13 MED ORDER — AMLODIPINE BESYLATE 10 MG PO TABS: 10.0000 mg | ORAL_TABLET | Freq: Every day | ORAL | Status: DC

## 2015-05-13 NOTE — Progress Notes (Signed)
Discharged PT per MD order and protocol. Reviewed discharge teaching and handouts given. Prescription given and explained. Pt verbalized understanding and left with all belongings. VSS. IV catheter D/C.  Patient ambulated downstairs by RN. Oswald Hillock, RN

## 2015-05-13 NOTE — Discharge Summary (Signed)
Physician Discharge Summary  Steven Sellers [id number] DOB: 04/13/1961 DOA: 05/12/2015  PCP: Steven Herring., MD  Admit date: 05/12/2015 Discharge date: 05/13/2015  Time spent: 45 minutes  Recommendations for Outpatient Follow-up:  -Will be discharged home today. -Advised to follow-up with primary care provider in 2 weeks for blood pressure check due to discontinuation of valsartan.   Discharge Diagnoses:  Principal Problem:   Hyperkalemia Active Problems:   CKD (chronic kidney disease) stage 3, GFR 30-59 ml/min   Discharge Condition: Stable and improved  Filed Weights   05/12/15 1053 05/12/15 1526  Weight: 74.844 kg (165 lb) 73.165 kg (161 lb 4.8 oz)    History of present illness:  Patient is a 55 year old man who has a history of tobacco abuse, hypertension and a history of pulmonary embolism in 2011, chronic kidney disease at least stage III with a baseline creatinine between 1.8 and 2.1 who presents with the above complaints. He states he has had 2 distinct episodes of this one on Thanksgiving day and one today. He cannot describe it any more than to say he gets "hot all over" and has a tingling sensation all over. 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 Upon arrival to the emergency department, labs were significant for potassium of 6.0, creatinine of 2.24. EKG independently reviewed shows peaked T waves diffusely, no acute ischemic abnormalities. We have been asked to admit him for further evaluation and management.  Hospital Course:   Hyperkalemia -Likely related to ARB in the presence of chronic kidney disease. -Resolved with Kayexalate and insulin.  Chronic kidney disease stage III -At baseline.  Transient fatigue -Resolved at present has had 2 distinct episodes. Unclear of etiology. -We'll inquire about placement of an event monitor  to see if a cardiac arrhythmia could be responsible.  Procedures:  None   Consultations:  None  Discharge Instructions  Discharge Instructions    Diet - low sodium heart healthy    Complete by:  As directed      Increase activity slowly    Complete by:  As directed             Medication List    STOP taking these medications        amLODipine-valsartan 10-320 MG tablet  Commonly known as:  EXFORGE      TAKE these medications        amLODipine 10 MG tablet  Commonly known as:  NORVASC  Take 1 tablet (10 mg total) by mouth daily.     guaiFENesin 600 MG 12 hr tablet  Commonly known as:  MUCINEX  Take 1,200 mg by mouth 2 (two) times daily as needed for cough or to loosen phlegm.       No Known Allergies     Follow-up Information    Follow up with Steven Herring., MD. Schedule an appointment as soon as possible for a visit in 2 weeks.   Specialty:  Internal Medicine   Contact information:   [address] [place] [id number] [phone]        The results of significant diagnostics from this hospitalization (including imaging, microbiology, ancillary and laboratory) are listed below for reference.    Significant Diagnostic Studies: Steven Sellers Wo Contrast  04/26/2015  CLINICAL DATA:  It muscle atrophy, arm weakness EXAM: MRI CERVICAL Sellers WITHOUT CONTRAST TECHNIQUE: Multiplanar,  multisequence Steven imaging of the cervical Sellers was performed. No intravenous contrast was administered. COMPARISON:  None. FINDINGS: The cervical cord is normal in size and signal. Vertebral body heights are maintained. The disc spaces are preserved. The cervical Sellers is normal in lordotic alignment. No static listhesis. Bone marrow signal is normal. Cerebellar tonsils are normal in position. C2-3: No significant disc bulge. No neural foraminal stenosis. No central canal stenosis. C3-4: No significant disc bulge. No neural foraminal stenosis. No central canal stenosis.  C4-5: No significant disc bulge. No neural foraminal stenosis. No central canal stenosis. C5-6: Small central disc protrusion. No neural foraminal stenosis. No central canal stenosis. C6-7: No significant disc bulge. No neural foraminal stenosis. No central canal stenosis. C7-T1: No significant disc bulge. No neural foraminal stenosis. No central canal stenosis. IMPRESSION: 1. At C5-6 there is a small central disc protrusion. Electronically Signed   By: Kathreen Devoid   On: 04/26/2015 11:41   Steven Sellers Wo Contrast  04/26/2015  CLINICAL DATA:  Muscle atrophy, weakness of the bilateral lower extremities EXAM: MRI LUMBAR Sellers WITHOUT CONTRAST TECHNIQUE: Multiplanar, multisequence Steven imaging of the lumbar Sellers was performed. No intravenous contrast was administered. COMPARISON:  None FINDINGS: The vertebral bodies of the lumbar Sellers are normal in size. The vertebral bodies of the lumbar Sellers are normal in alignment. There is normal bone marrow signal demonstrated throughout the vertebra. Disc desiccation and mild disc height loss at L3-4, L4-5 and to a greater degree L5-S1. The spinal cord is normal in signal and contour. The cord terminates normally at L1 . The nerve roots of the cauda equina and the filum terminale are normal. The visualized portions of the SI joints are unremarkable. The imaged intra-abdominal contents are unremarkable. T12-L1: No significant disc bulge. No evidence of neural foraminal stenosis. No central canal stenosis. L1-L2: No significant disc bulge. No evidence of neural foraminal stenosis. No central canal stenosis. L2-L3: No significant disc bulge. No evidence of neural foraminal stenosis. No central canal stenosis. L3-L4: Mild broad-based disc bulge with a small central disc protrusion. No evidence of neural foraminal stenosis. No central canal stenosis. L4-L5: Mild broad-based disc bulge. Mild bilateral lateral recess narrowing. No evidence of neural foraminal stenosis. No  central canal stenosis. Mild bilateral facet arthropathy. L5-S1: Broad central disc protrusion. Bilateral lateral recess narrowing. No evidence of neural foraminal stenosis. No central canal stenosis. IMPRESSION: 1. At L5-S1 there is degenerative disc disease with disc height loss and a broad central disc protrusion with bilateral lateral recess narrowing. 2. At L3-4 there is a mild broad-based disc bulge with a small central disc protrusion. Electronically Signed   By: Kathreen Devoid   On: 04/26/2015 11:31    Microbiology: No results found for this or any previous visit (from the past 240 hour(s)).   Labs: Basic Metabolic Panel:  Recent Labs Lab 05/12/15 1130 05/13/15 0621  NA 134* 141  K 6.0* 4.5  CL 108 115*  CO2 20* 19*  GLUCOSE 92 88  BUN 30* 25*  CREATININE 2.24* 1.87*  CALCIUM 8.6* 8.3*   Liver Function Tests:  Recent Labs Lab 05/12/15 1130  AST 18  ALT 16*  ALKPHOS 83  BILITOT 0.5  PROT 7.4  ALBUMIN 3.7   No results for input(s): LIPASE, AMYLASE in the last 168 hours. No results for input(s): AMMONIA in the last 168 hours. CBC:  Recent Labs Lab 05/12/15 1130 05/13/15 0621  WBC 12.3* 6.9  NEUTROABS 10.4*  --  HGB 13.1 11.6*  HCT 39.7 35.6*  MCV 91.5 93.0  PLT 212 189   Cardiac Enzymes: No results for input(s): CKTOTAL, CKMB, CKMBINDEX, TROPONINI in the last 168 hours. BNP: BNP (last 3 results) No results for input(s): BNP in the last 8760 hours.  ProBNP (last 3 results) No results for input(s): PROBNP in the last 8760 hours.  CBG:  Recent Labs Lab 05/12/15 1557 05/12/15 2019 05/12/15 2054 05/13/15 0748 05/13/15 1206  GLUCAP 168* 41* 72 92 95       Signed:  HERNANDEZ ACOSTA,ESTELA  [facility] Pager: [phone] 05/13/2015, 1:40 PM

## 2015-05-13 NOTE — Progress Notes (Signed)
Utilization Review Completed.Steven Sellers T1/05/2015  

## 2015-05-13 NOTE — Plan of Care (Signed)
Problem: Activity: Goal: Risk for activity intolerance will decrease Outcome: Completed/Met Date Met:  05/13/15 Pt walks frequently

## 2015-05-15 ENCOUNTER — Telehealth: Payer: Self-pay | Admitting: Neurology

## 2015-05-15 MED FILL — AMLODIPINE BESYLATE 10 MG T: 10 | 90 days supply | Qty: 90 | Fill #0

## 2015-05-15 NOTE — Telephone Encounter (Signed)
Patient is calling and states that the [employer] [facility] [employer] short term disability needs his paperwork faxed to [employer] [phone].  Phone [phone]. Please call the patient about this and he states he also has some other health questions.  Thanks!

## 2015-05-15 NOTE — Telephone Encounter (Signed)
I have spoken with Stephon and advised him that I have completed his fmla paperwork, which sts. pt. is permanently disabled, due to limb-girdle muscular dystrophy, and have faxed it back to [employer] at the # provided below.  I have also mailed a copy to pt's home address, for his records/fim

## 2015-05-21 ENCOUNTER — Ambulatory Visit (INDEPENDENT_AMBULATORY_CARE_PROVIDER_SITE_OTHER): Payer: 59

## 2015-05-21 DIAGNOSIS — G710 Muscular dystrophy: Secondary | ICD-10-CM | POA: Diagnosis not present

## 2015-05-21 DIAGNOSIS — Z6822 Body mass index (BMI) 22.0-22.9, adult: Secondary | ICD-10-CM | POA: Diagnosis not present

## 2015-05-21 DIAGNOSIS — R55 Syncope and collapse: Secondary | ICD-10-CM

## 2015-05-21 DIAGNOSIS — Z1389 Encounter for screening for other disorder: Secondary | ICD-10-CM | POA: Diagnosis not present

## 2015-05-22 ENCOUNTER — Other Ambulatory Visit: Payer: Self-pay

## 2015-05-22 DIAGNOSIS — R55 Syncope and collapse: Secondary | ICD-10-CM

## 2015-05-22 NOTE — Telephone Encounter (Signed)
Noted  

## 2015-06-04 DIAGNOSIS — G729 Myopathy, unspecified: Secondary | ICD-10-CM | POA: Diagnosis not present

## 2015-06-04 DIAGNOSIS — M62529 Muscle wasting and atrophy, not elsewhere classified, unspecified upper arm: Secondary | ICD-10-CM | POA: Diagnosis not present

## 2015-06-07 ENCOUNTER — Ambulatory Visit (INDEPENDENT_AMBULATORY_CARE_PROVIDER_SITE_OTHER): Payer: 59 | Admitting: Internal Medicine

## 2015-06-07 ENCOUNTER — Encounter: Payer: Self-pay | Admitting: Internal Medicine

## 2015-06-07 VITALS — BP 136/88 | HR 73 | Ht 74.0 in | Wt 166.0 lb

## 2015-06-07 DIAGNOSIS — Z8669 Personal history of other diseases of the nervous system and sense organs: Secondary | ICD-10-CM | POA: Diagnosis not present

## 2015-06-07 NOTE — Patient Instructions (Signed)
Your physician recommends that you schedule a follow-up appointment in: To Be Determined   Your physician has requested that you have an echocardiogram. Echocardiography is a painless test that uses sound waves to create images of your heart. It provides your doctor with information about the size and shape of your heart and how well your heart's chambers and valves are working. This procedure takes approximately one hour. There are no restrictions for this procedure.   Your physician recommends that you continue on your current medications as directed. Please refer to the Current Medication list given to you today.  If you need a refill on your cardiac medications before your next appointment, please call your pharmacy.  Thank you for choosing [facility]!

## 2015-06-07 NOTE — Progress Notes (Signed)
Cardiology Office Note   Date:  06/07/2015   ID:  Steven Sellers, DOB 07-01-1960, MRN [id number]  PCP:  [name], MD  Cardiologist:   Dorris Carnes, MD   Pt referred by Dr Lanier Prude and Gerarda Fraction for cardiac involement in [place]      History of Present Illness: Steven Sellers is a 55 y.o. 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  Lasted several min   Note that in ER on 12/31 K was 6.  No arrhythmia documented  ARB  stopped  He has had a couple minor spells since  No syncope  No palpitations   He was seen in [facility] ER with renal failure after MRI (1 year ago)  COmplained of sharp CP then  Lasting seconds  Near syncope     He has just turned in a 14 day event monitor  Did not have any spells while he was using  Breathing overall is OK  No CP      Current Outpatient Prescriptions  Medication Sig Dispense Refill  . amLODipine (NORVASC) 10 MG tablet Take 1 tablet (10 mg total) by mouth daily. 30 tablet 2  . guaiFENesin (MUCINEX) 600 MG 12 hr tablet Take 1,200 mg by mouth 2 (two) times daily as needed for cough or to loosen phlegm.     No current facility-administered medications for this visit.    Allergies:   Review of patient's allergies indicates no known allergies.   Past Medical History  Diagnosis Date  . Hypertension   . Alcohol use ([place])   . Tobacco use   . Pulmonary embolism ([place]) 2011  . Vision abnormalities     Past Surgical History  Procedure Laterality Date  . Cardiac catheterization  2002    normal coronary arteries     Social History:  The patient  reports that he has been smoking Cigarettes.  He started smoking about 41 years ago. He has been smoking about 1.00 pack per day. He does not have any smokeless  tobacco history on file. He reports that he drinks alcohol. He reports that he does not use illicit drugs.   Family History:  The patient's family history includes Heart attack in his father; Hypertension in his mother.    ROS:  Please see the history of present illness. All other systems are reviewed and  Negative to the above problem except as noted.    PHYSICAL EXAM: VS:  Ht 6\' 2"  (1.88 m)  Wt 75.297 kg (166 lb)  BMI 21.30 kg/m2  GEN: Well nourished, well developed, in no acute distress HEENT: normal Neck: no JVD, carotid bruits, or masses Cardiac: RRR; no murmurs, rubs, or gallops,no edema  Respiratory:  clear to auscultation bilaterally, normal work of breathing GI: soft, nontender, nondistended, + BS  No hepatomegaly  MS: Deferred   Skin: warm and dry, no rash Neuro: A and O x 3  Otherwise deferred   Psych: euthymic mood, full affect   EKG:  EKG is not  ordered today.  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  K was elevated on one spell  No documented arrhythmia   He has just turned in event monitor  Results not back  But he did not have any symptoms while wearing .  Would get echo   Will review and discuss with EP Warned him to watch for dizziness.  Sit until spells pass   Disposition:   FU will depend on test results    Signed, Dorris Carnes, MD  06/07/2015 1:44 PM    [facility] [place], [place], [place]  [address] Phone: [phone]; Fax: [phone]

## 2015-06-08 ENCOUNTER — Ambulatory Visit (HOSPITAL_COMMUNITY)
Admission: RE | Admit: 2015-06-08 | Discharge: 2015-06-08 | Disposition: A | Discharge status: Home / Self Care | Payer: 59 | Source: Ambulatory Visit | Attending: Internal Medicine | Admitting: Internal Medicine

## 2015-06-08 DIAGNOSIS — I129 Hypertensive chronic kidney disease with stage 1 through stage 4 chronic kidney disease, or unspecified chronic kidney disease: Secondary | ICD-10-CM | POA: Insufficient documentation

## 2015-06-08 DIAGNOSIS — Z8669 Personal history of other diseases of the nervous system and sense organs: Secondary | ICD-10-CM | POA: Insufficient documentation

## 2015-06-08 DIAGNOSIS — I517 Cardiomegaly: Secondary | ICD-10-CM | POA: Diagnosis not present

## 2015-06-08 DIAGNOSIS — G710 Muscular dystrophy: Secondary | ICD-10-CM | POA: Insufficient documentation

## 2015-06-08 DIAGNOSIS — N183 Chronic kidney disease, stage 3 (moderate): Secondary | ICD-10-CM | POA: Insufficient documentation

## 2015-06-25 ENCOUNTER — Telehealth: Payer: Self-pay | Admitting: *Deleted

## 2015-06-25 NOTE — Telephone Encounter (Signed)
Patient form on faith desk.

## 2015-06-28 ENCOUNTER — Telehealth: Payer: Self-pay | Admitting: *Deleted

## 2015-06-28 NOTE — Telephone Encounter (Signed)
FMLA paperwork and an attending physician statement for claim # [id number] completed and faxed back to [facility], fax # [phone], with fax confirmation received.  Copies mailed to pt's home address for his records.  Copies sent to be scanned into EPIC/fim

## 2015-07-09 DIAGNOSIS — Z0271 Encounter for disability determination: Secondary | ICD-10-CM

## 2015-08-07 MED FILL — AMLODIPINE BESYLATE 10 MG T: 10 | 90 days supply | Qty: 90 | Fill #0

## 2015-08-27 DIAGNOSIS — Z6821 Body mass index (BMI) 21.0-21.9, adult: Secondary | ICD-10-CM | POA: Diagnosis not present

## 2015-08-27 DIAGNOSIS — Z1389 Encounter for screening for other disorder: Secondary | ICD-10-CM | POA: Diagnosis not present

## 2015-08-27 DIAGNOSIS — E782 Mixed hyperlipidemia: Secondary | ICD-10-CM | POA: Diagnosis not present

## 2015-08-27 DIAGNOSIS — I10 Essential (primary) hypertension: Secondary | ICD-10-CM | POA: Diagnosis not present

## 2015-08-27 DIAGNOSIS — N189 Chronic kidney disease, unspecified: Secondary | ICD-10-CM | POA: Diagnosis not present

## 2015-08-28 MED FILL — AMLODIPINE BESYLATE 10 MG T: 10 | 30 days supply | Qty: 30 | Fill #0

## 2015-11-09 ENCOUNTER — Encounter (HOSPITAL_COMMUNITY): Payer: Self-pay

## 2015-11-09 ENCOUNTER — Emergency Department (HOSPITAL_COMMUNITY)
Admission: EM | Admit: 2015-11-09 | Discharge: 2015-11-10 | Disposition: A | Discharge status: Home / Self Care | Payer: BLUE CROSS/BLUE SHIELD | Attending: Emergency Medicine | Admitting: Emergency Medicine

## 2015-11-09 ENCOUNTER — Emergency Department (HOSPITAL_COMMUNITY): Payer: BLUE CROSS/BLUE SHIELD

## 2015-11-09 DIAGNOSIS — W172XXA Fall into hole, initial encounter: Secondary | ICD-10-CM | POA: Diagnosis not present

## 2015-11-09 DIAGNOSIS — Y92007 Garden or yard of unspecified non-institutional (private) residence as the place of occurrence of the external cause: Secondary | ICD-10-CM | POA: Insufficient documentation

## 2015-11-09 DIAGNOSIS — Y9389 Activity, other specified: Secondary | ICD-10-CM | POA: Insufficient documentation

## 2015-11-09 DIAGNOSIS — S93504A Unspecified sprain of right lesser toe(s), initial encounter: Secondary | ICD-10-CM | POA: Diagnosis not present

## 2015-11-09 DIAGNOSIS — I10 Essential (primary) hypertension: Secondary | ICD-10-CM | POA: Insufficient documentation

## 2015-11-09 DIAGNOSIS — S8391XA Sprain of unspecified site of right knee, initial encounter: Secondary | ICD-10-CM | POA: Insufficient documentation

## 2015-11-09 DIAGNOSIS — S8991XA Unspecified injury of right lower leg, initial encounter: Secondary | ICD-10-CM | POA: Diagnosis present

## 2015-11-09 DIAGNOSIS — F17210 Nicotine dependence, cigarettes, uncomplicated: Secondary | ICD-10-CM | POA: Diagnosis not present

## 2015-11-09 DIAGNOSIS — Y999 Unspecified external cause status: Secondary | ICD-10-CM | POA: Insufficient documentation

## 2015-11-09 DIAGNOSIS — W1842XA Slipping, tripping and stumbling without falling due to stepping into hole or opening, initial encounter: Secondary | ICD-10-CM | POA: Diagnosis not present

## 2015-11-09 DIAGNOSIS — S93501A Unspecified sprain of right great toe, initial encounter: Secondary | ICD-10-CM | POA: Insufficient documentation

## 2015-11-09 DIAGNOSIS — S93509A Unspecified sprain of unspecified toe(s), initial encounter: Secondary | ICD-10-CM

## 2015-11-09 HISTORY — DX: Muscular dystrophy, unspecified: G71.00

## 2015-11-09 NOTE — ED Notes (Signed)
Fall tonight X2. C/o right knee pain . ETOH per family member

## 2015-11-10 ENCOUNTER — Emergency Department (HOSPITAL_COMMUNITY): Payer: BLUE CROSS/BLUE SHIELD

## 2015-11-10 MED ORDER — IBUPROFEN 600 MG PO TABS: 600.0000 mg | ORAL_TABLET | Freq: Three times a day (TID) | ORAL | Status: DC

## 2015-11-10 MED ORDER — IBUPROFEN 800 MG PO TABS
800.0000 mg | ORAL_TABLET | Freq: Once | ORAL | Status: AC
  Administered 2015-11-10: 800 mg via ORAL
  Filled 2015-11-10: qty 1

## 2015-11-10 NOTE — Discharge Instructions (Signed)
Cryotherapy °Cryotherapy is when you put ice on your injury. Ice helps lessen pain and puffiness (swelling) after an injury. Ice works the best when you start using it in the first 24 to 48 hours after an injury. °HOME CARE °· Put a dry or damp towel between the ice pack and your skin. °· You may press gently on the ice pack. °· Leave the ice on for no more than 10 to 20 minutes at a time. °· Check your skin after 5 minutes to make sure your skin is okay. °· Rest at least 20 minutes between ice pack uses. °· Stop using ice when your skin loses feeling (numbness). °· Do not use ice on someone who cannot tell you when it hurts. This includes small children and people with memory problems (dementia). °GET HELP RIGHT AWAY IF: °· You have white spots on your skin. °· Your skin turns blue or pale. °· Your skin feels waxy or hard. °· Your puffiness gets worse. °MAKE SURE YOU:  °· Understand these instructions. °· Will watch your condition. °· Will get help right away if you are not doing well or get worse. °  °This information is not intended to replace advice given to you by your health care provider. Make sure you discuss any questions you have with your health care provider. °  °Document Released: 10/15/2007 Document Revised: 07/21/2011 Document Reviewed: 12/19/2010 °Elsevier Interactive Patient Education ©2016 [employer] ° °Knee Sprain °A knee sprain is a tear in the strong bands of tissue that connect the bones (ligaments) of your knee. °HOME CARE °· Raise (elevate) your injured knee to lessen puffiness (swelling). °· To ease pain and puffiness, put ice on the injured area. °¨ Put ice in a plastic bag. °¨ Place a towel between your skin and the bag. °¨ Leave the ice on for 20 minutes, 2-3 times a day. °· Only take medicine as told by your doctor. °· Do not leave your knee unprotected until pain and stiffness go away (usually 4-6 weeks). °· If you have a cast or splint, do not get it wet. If your doctor told you to  not take it off, cover it with a plastic bag when you shower or bathe. Do not swim. °· Your doctor may have you do exercises to prevent or limit permanent weakness and stiffness. °GET HELP RIGHT AWAY IF:  °· Your cast or splint becomes damaged. °· Your pain gets worse. °· You have a lot of pain, puffiness, or numbness below the cast or splint. °MAKE SURE YOU:  °· Understand these instructions. °· Will watch your condition. °· Will get help right away if you are not doing well or get worse. °  °This information is not intended to replace advice given to you by your health care provider. Make sure you discuss any questions you have with your health care provider. °  °Document Released: 04/16/2009 Document Revised: 05/03/2013 Document Reviewed: 01/04/2013 °Elsevier Interactive Patient Education ©2016 [employer]vier Inc[employer] ° °

## 2015-11-10 NOTE — ED Provider Notes (Signed)
CSN: [id number]     Arrival date & time 11/09/15  2152 History   First MD Initiated Contact with Patient 11/10/15 0001     Chief Complaint  Patient presents with  . Fall     (Consider location/radiation/quality/duration/timing/severity/associated sxs/prior Treatment) HPI   Steven Sellers is a 55 y.o. male who presents to the Emergency Department complaining of right knee and right foot pain that began earlier this evening.  He states that he accidentally stepped in a hole in the yard which caused him to "twist" his right knee and fall.  Upon standing, he noticed that he also had pain to the toes of the right foot which caused hin to fall a second time.  He states he is unable to fully extend the right knee without significant pain or bear weight to the right foot.  He has not tried any therapies for his symptoms.  He admits to drinking several beers today.  He denies head injury, LOC, vomiting, neck or back pain.     Past Medical History  Diagnosis Date  . Hypertension   . Alcohol use ([place])   . Tobacco use   . Pulmonary embolism ([place]) 2011  . Vision abnormalities   . Muscular dystrophy [facility])    Past Surgical History  Procedure Laterality Date  . Cardiac catheterization  2002    normal coronary arteries   Family History  Problem Relation Age of Onset  . Hypertension Mother   . Heart attack Father    Social History  Substance Use Topics  . Smoking status: Current Every Day Smoker -- 1.00 packs/day    Types: Cigarettes    Start date: 05/12/1974  . Smokeless tobacco: None  . Alcohol Use: 0.0 oz/week    0 Standard drinks or equivalent per week     Comment: occ    Review of Systems  Constitutional: Negative for fever and chills.  Gastrointestinal: Negative for nausea and vomiting.  Genitourinary: Negative for dysuria and difficulty urinating.  Musculoskeletal: Positive for joint swelling (swelling of the right first, second and third toes) and arthralgias (right knee and  foot pain).  Skin: Negative for color change and wound.  Neurological: Negative for dizziness, syncope and headaches.  All other systems reviewed and are negative.     Allergies  Other  Home Medications   Prior to Admission medications   Medication Sig Start Date End Date Taking? Authorizing Provider  amLODipine (NORVASC) 10 MG tablet Take 1 tablet (10 mg total) by mouth daily. 05/13/15   Erline Hau, MD   BP 141/97 mmHg  Pulse 95  Temp(Src) 98.3 F (36.8 C) (Oral)  Resp 18  Ht 6\' 2"  (1.88 m)  Wt 74.844 kg  BMI 21.18 kg/m2  SpO2 97% Physical Exam  Constitutional: He is oriented to person, place, and time. He appears well-developed and well-nourished. No distress.  HENT:  Head: Atraumatic.  Mouth/Throat: Oropharynx is clear and moist.  Eyes: EOM are normal. Pupils are equal, round, and reactive to light.  Neck: Normal range of motion. Neck supple.  Cardiovascular: Normal rate, regular rhythm and intact distal pulses.   Pulmonary/Chest: Effort normal and breath sounds normal. No respiratory distress.  Musculoskeletal: He exhibits edema and tenderness.  ttp of the anterior right knee.  No erythema, effusion, or step-off deformity.  DP pulse brisk, distal sensation intact. Calf is soft and NT.  Tenderness and edema of the first, second and third toes of the right foot.  No bony  deformity, sensation intact.    Neurological: He is alert and oriented to person, place, and time. He exhibits normal muscle tone. Coordination normal.  Skin: Skin is warm and dry. No erythema.  Nursing note and vitals reviewed.   ED Course  Procedures (including critical care time) Labs Review Labs Reviewed - No data to display  Imaging Review Dg Knee Complete 4 Views Right  11/09/2015  CLINICAL DATA:  Fall.  Knee pain EXAM: RIGHT KNEE - COMPLETE 4+ VIEW COMPARISON:  None. FINDINGS: No evidence of fracture, dislocation, or joint effusion. No evidence of arthropathy or other focal bone  abnormality. Soft tissues are unremarkable. IMPRESSION: Negative. Electronically Signed   By: Franchot Gallo M.D.   On: 11/09/2015 22:48   Dg Foot Complete Right  11/10/2015  CLINICAL DATA:  Right foot pain and swelling at the first through third toes after falling in a hole tonight. EXAM: RIGHT FOOT COMPLETE - 3+ VIEW COMPARISON:  None. FINDINGS: There is no evidence of fracture or dislocation. There is no evidence of arthropathy or other focal bone abnormality. Soft tissues are unremarkable. IMPRESSION: Negative. Electronically Signed   By: Andreas Newport M.D.   On: 11/10/2015 00:49   I have personally reviewed and evaluated these images and lab results as part of my medical decision-making.   EKG Interpretation None      MDM   Final diagnoses:  Sprain, knee, right, initial encounter  Toe sprain, initial encounter   Pt is alert, well appearing.  Smells of ETOH, but acting appropriately , family members at bedside.  XR's neg for fx.  Likely sprains.  NV intact.  ACE wrap applied to the knee, post op shoe given.  Pt agrees to elevate, ice and PMD f/u if needed.        Kem Parkinson, PA-C 11/10/15 [id number]  Rolland Porter, MD 11/10/15 0110

## 2015-11-30 ENCOUNTER — Encounter (HOSPITAL_COMMUNITY): Payer: Self-pay | Admitting: *Deleted

## 2015-11-30 ENCOUNTER — Inpatient Hospital Stay (HOSPITAL_COMMUNITY)
Admission: EM | Admit: 2015-11-30 | Discharge: 2015-12-06 | DRG: 683 | Disposition: A | Discharge status: Home / Self Care | Payer: BLUE CROSS/BLUE SHIELD | Attending: Internal Medicine | Admitting: Internal Medicine

## 2015-11-30 ENCOUNTER — Emergency Department (HOSPITAL_COMMUNITY): Payer: BLUE CROSS/BLUE SHIELD

## 2015-11-30 DIAGNOSIS — Z79899 Other long term (current) drug therapy: Secondary | ICD-10-CM

## 2015-11-30 DIAGNOSIS — Z992 Dependence on renal dialysis: Secondary | ICD-10-CM

## 2015-11-30 DIAGNOSIS — I120 Hypertensive chronic kidney disease with stage 5 chronic kidney disease or end stage renal disease: Secondary | ICD-10-CM | POA: Diagnosis present

## 2015-11-30 DIAGNOSIS — E871 Hypo-osmolality and hyponatremia: Secondary | ICD-10-CM | POA: Diagnosis present

## 2015-11-30 DIAGNOSIS — N179 Acute kidney failure, unspecified: Secondary | ICD-10-CM | POA: Diagnosis present

## 2015-11-30 DIAGNOSIS — N186 End stage renal disease: Secondary | ICD-10-CM | POA: Diagnosis present

## 2015-11-30 DIAGNOSIS — Z8249 Family history of ischemic heart disease and other diseases of the circulatory system: Secondary | ICD-10-CM | POA: Diagnosis not present

## 2015-11-30 DIAGNOSIS — R739 Hyperglycemia, unspecified: Secondary | ICD-10-CM | POA: Diagnosis present

## 2015-11-30 DIAGNOSIS — G710 Muscular dystrophy: Secondary | ICD-10-CM | POA: Diagnosis present

## 2015-11-30 DIAGNOSIS — E8770 Fluid overload, unspecified: Secondary | ICD-10-CM | POA: Diagnosis present

## 2015-11-30 DIAGNOSIS — F17210 Nicotine dependence, cigarettes, uncomplicated: Secondary | ICD-10-CM | POA: Diagnosis present

## 2015-11-30 DIAGNOSIS — N19 Unspecified kidney failure: Secondary | ICD-10-CM

## 2015-11-30 DIAGNOSIS — E875 Hyperkalemia: Secondary | ICD-10-CM | POA: Diagnosis present

## 2015-11-30 DIAGNOSIS — N183 Chronic kidney disease, stage 3 unspecified: Secondary | ICD-10-CM | POA: Diagnosis present

## 2015-11-30 DIAGNOSIS — D638 Anemia in other chronic diseases classified elsewhere: Secondary | ICD-10-CM | POA: Diagnosis present

## 2015-11-30 DIAGNOSIS — D649 Anemia, unspecified: Secondary | ICD-10-CM | POA: Diagnosis present

## 2015-11-30 DIAGNOSIS — Z86711 Personal history of pulmonary embolism: Secondary | ICD-10-CM | POA: Diagnosis not present

## 2015-11-30 DIAGNOSIS — Z789 Other specified health status: Secondary | ICD-10-CM | POA: Diagnosis not present

## 2015-11-30 DIAGNOSIS — M7989 Other specified soft tissue disorders: Secondary | ICD-10-CM

## 2015-11-30 DIAGNOSIS — Z01818 Encounter for other preprocedural examination: Secondary | ICD-10-CM

## 2015-11-30 DIAGNOSIS — N189 Chronic kidney disease, unspecified: Secondary | ICD-10-CM | POA: Diagnosis not present

## 2015-11-30 LAB — HEPATIC FUNCTION PANEL
ALK PHOS: 78 U/L (ref 38–126)
ALT: 23 U/L (ref 17–63)
AST: 16 U/L (ref 15–41)
Albumin: 2.6 g/dL — ABNORMAL LOW (ref 3.5–5.0)
BILIRUBIN DIRECT: 0.1 mg/dL (ref 0.1–0.5)
BILIRUBIN INDIRECT: 0.5 mg/dL (ref 0.3–0.9)
BILIRUBIN TOTAL: 0.6 mg/dL (ref 0.3–1.2)
Total Protein: 5.8 g/dL — ABNORMAL LOW (ref 6.5–8.1)

## 2015-11-30 LAB — BASIC METABOLIC PANEL
Anion gap: 15 (ref 5–15)
BUN: 63 mg/dL — AB (ref 6–20)
CALCIUM: 7 mg/dL — AB (ref 8.9–10.3)
CO2: 20 mmol/L — ABNORMAL LOW (ref 22–32)
CREATININE: 9.41 mg/dL — AB (ref 0.61–1.24)
Chloride: 91 mmol/L — ABNORMAL LOW (ref 101–111)
GFR calc Af Amer: 6 mL/min — ABNORMAL LOW (ref >60)
GFR, EST NON AFRICAN AMERICAN: 6 mL/min — AB (ref >60)
GLUCOSE: 133 mg/dL — AB (ref 65–99)
Potassium: 4.5 mmol/L (ref 3.5–5.1)
SODIUM: 126 mmol/L — AB (ref 135–145)

## 2015-11-30 LAB — CBC WITH DIFFERENTIAL/PLATELET
Basophils Absolute: 0.1 10*3/uL (ref 0.0–0.1)
Basophils Relative: 1 %
EOS ABS: 0.1 10*3/uL (ref 0.0–0.7)
EOS PCT: 1 %
HCT: 32.9 % — ABNORMAL LOW (ref 39.0–52.0)
Hemoglobin: 11.6 g/dL — ABNORMAL LOW (ref 13.0–17.0)
LYMPHS ABS: 1.1 10*3/uL (ref 0.7–4.0)
Lymphocytes Relative: 10 %
MCH: 31.2 pg (ref 26.0–34.0)
MCHC: 35.3 g/dL (ref 30.0–36.0)
MCV: 88.4 fL (ref 78.0–100.0)
MONO ABS: 0.7 10*3/uL (ref 0.1–1.0)
MONOS PCT: 7 %
Neutro Abs: 8.5 10*3/uL — ABNORMAL HIGH (ref 1.7–7.7)
Neutrophils Relative %: 81 %
PLATELETS: 231 10*3/uL (ref 150–400)
RBC: 3.72 MIL/uL — AB (ref 4.22–5.81)
RDW: 13.4 % (ref 11.5–15.5)
WBC: 10.4 10*3/uL (ref 4.0–10.5)

## 2015-11-30 LAB — URINALYSIS, ROUTINE W REFLEX MICROSCOPIC
BILIRUBIN URINE: NEGATIVE
Glucose, UA: 250 mg/dL — AB
Ketones, ur: NEGATIVE mg/dL
LEUKOCYTES UA: NEGATIVE
NITRITE: NEGATIVE
Protein, ur: >300 mg/dL — AB
SPECIFIC GRAVITY, URINE: 1.015 (ref 1.005–1.030)
pH: 6 (ref 5.0–8.0)

## 2015-11-30 LAB — URINE MICROSCOPIC-ADD ON

## 2015-11-30 LAB — CREATININE, URINE, RANDOM: Creatinine, Urine: 48.53 mg/dL

## 2015-11-30 LAB — CK: CK TOTAL: 537 U/L — AB (ref 49–397)

## 2015-11-30 LAB — OSMOLALITY, URINE: OSMOLALITY UR: 222 mosm/kg — AB (ref 300–900)

## 2015-11-30 LAB — SODIUM, URINE, RANDOM: Sodium, Ur: 43 mmol/L

## 2015-11-30 MED ORDER — HYDRALAZINE HCL 20 MG/ML IJ SOLN: 10.0000 mg | Freq: Four times a day (QID) | INTRAMUSCULAR | Status: DC | PRN

## 2015-11-30 MED ORDER — HEPARIN SODIUM (PORCINE) 5000 UNIT/ML IJ SOLN
5000.0000 [IU] | Freq: Three times a day (TID) | INTRAMUSCULAR | Status: DC
  Administered 2015-11-30 – 2015-12-05 (×10): 5000 [IU] via SUBCUTANEOUS
  Filled 2015-11-30 (×9): qty 1

## 2015-11-30 MED ORDER — ACETAMINOPHEN 325 MG PO TABS
650.0000 mg | ORAL_TABLET | Freq: Four times a day (QID) | ORAL | Status: DC | PRN
  Administered 2015-12-02 – 2015-12-03 (×2): 650 mg via ORAL
  Filled 2015-11-30 (×3): qty 2

## 2015-11-30 MED ORDER — FUROSEMIDE 10 MG/ML IJ SOLN: 200.0000 mg | Freq: Two times a day (BID) | INTRAVENOUS | Status: DC

## 2015-11-30 MED ORDER — SODIUM CHLORIDE 0.9% FLUSH
3.0000 mL | Freq: Two times a day (BID) | INTRAVENOUS | Status: DC
  Administered 2015-12-01 – 2015-12-06 (×7): 3 mL via INTRAVENOUS

## 2015-11-30 MED ORDER — FUROSEMIDE 10 MG/ML IJ SOLN
160.0000 mg | Freq: Two times a day (BID) | INTRAVENOUS | Status: DC
  Administered 2015-11-30: 160 mg via INTRAVENOUS
  Filled 2015-11-30 (×6): qty 16

## 2015-11-30 MED ORDER — SODIUM CHLORIDE 0.9 % IV SOLN
INTRAVENOUS | Status: DC
  Administered 2015-11-30: 19:00:00 via INTRAVENOUS

## 2015-11-30 MED ORDER — ACETAMINOPHEN 650 MG RE SUPP
650.0000 mg | Freq: Four times a day (QID) | RECTAL | Status: DC | PRN
Start: 2015-11-30 — End: 2015-12-06

## 2015-11-30 MED ORDER — METOPROLOL TARTRATE 50 MG PO TABS
50.0000 mg | ORAL_TABLET | Freq: Two times a day (BID) | ORAL | Status: DC
  Administered 2015-11-30 – 2015-12-06 (×10): 50 mg via ORAL
  Filled 2015-11-30 (×11): qty 1

## 2015-11-30 MED ORDER — DILTIAZEM HCL ER BEADS 180 MG PO CP24
180.0000 mg | ORAL_CAPSULE | Freq: Every day | ORAL | Status: DC
  Administered 2015-12-01: 180 mg via ORAL
  Filled 2015-11-30 (×5): qty 1

## 2015-11-30 NOTE — H&P (Signed)
TRH H&P   Patient Demographics:    Steven Sellers, is a 55 y.o. male  MRN: [id number]   DOB - 06-05-1960  Admit Date - 11/30/2015  Outpatient Primary MD for the patient is [name], MD  Referring MD/NP/PA: Nat Christen  Outpatient Specialists: none  Patient coming from: home  Chief Complaint  Patient presents with  . Abnormal Labs       HPI:    Steven Sellers  is a 55 y.o. male, w Hypertension, apparently had labs done by pcp which indicated renal failure and sent to ER for evaluation.  Pt notes slight swelling in the legs.  Pt denies cp, palp, sob, orthopnea.  Pt notes being started on tiazac recently but otherwise no new medication.  Pt denies nsaid use.  Pt denies any iv dye exposure.  Pt denies diarrhea or dehydration but does not working outside.    In ED,  Pt found to have ARF, w Bun 63, Creatinine 9.41. Pt also noted to be mildly hyponatremic.  Na=126, and calcium 7.0 but albumin not available. pt also noted to be mildly anemic.  Hgb =11.6   Pt denies brbpr, black stool.   Pt will be admitted for acute on chronic renal failure, hyponatremia, and anemia, and possible hypocalcemia.      Review of systems:    In addition to the HPI above, No Fever-chills, No Headache, No changes with Vision or hearing, No problems swallowing food or Liquids, No Chest pain, Cough or Shortness of Breath, No Abdominal pain, No Nausea or Vommitting, Bowel movements are regular, No Blood in stool or Urine, No dysuria, No new skin rashes or bruises, No new joints pains-aches,  No new weakness, tingling, numbness in any extremity, No recent weight gain or loss, No polyuria, polydypsia or polyphagia, No significant Mental Stressors.  A full 10 point Review of Systems was done, except as stated above, all other Review of Systems were negative.   With Past History of the following :     Past Medical History  Diagnosis Date  . Hypertension   . Alcohol use ([place])   . Tobacco use   . Pulmonary embolism ([place]) 2011  . Vision abnormalities   . Muscular dystrophy [facility])       Past Surgical History  Procedure Laterality Date  . Cardiac catheterization  2002    normal coronary arteries      Social History:     Social History  Substance Use Topics  . Smoking status: Current Every Day Smoker -- 1.00 packs/day    Types: Cigarettes    Start date: 05/12/1974  . Smokeless tobacco: Not on file  . Alcohol Use: 14.4 oz/week    24 Standard drinks or equivalent per week     Comment: occ     Lives - at home  Mobility - ambulates by self  Family History :     Family History  Problem Relation Age of Onset  . Hypertension Mother   . Heart attack Father    Negative for kidney disease   Home Medications:   Prior to Admission medications   Medication Sig Start Date End Date Taking? Authorizing Provider  diltiazem (TIAZAC) 180 MG 24 hr capsule Take 180 mg by mouth daily.  11/28/15  Yes Historical Provider, MD  ibuprofen (ADVIL,MOTRIN) 600 MG tablet Take 1 tablet (600 mg total) by mouth 3 (three) times daily. Take with food Patient not taking: Reported on 11/30/2015 11/10/15   Kem Parkinson, PA-C     Allergies:     Allergies  Allergen Reactions  . Other Other (See Comments)    IV contrast- Renal issues     Physical Exam:   Vitals  Blood pressure 155/90, pulse 63, temperature 98.6 F (37 C), temperature source Oral, resp. rate 19, height 6\' 2"  (1.88 m), weight 81.647 kg (180 lb), SpO2 95 %.   1. General  lying in bed in NAD,   2. Normal affect and insight, Not Suicidal or Homicidal, Awake Alert, Oriented X 3.  3. No F.N deficits, ALL C.Nerves Intact, Strength 5/5 all 4 extremities, Sensation intact all 4 extremities, Plantars down going.  4. Ears and Eyes appear Normal, Conjunctivae clear, PERRLA. Moist Oral Mucosa.  5. Supple Neck, No JVD, No  cervical lymphadenopathy appriciated, No Carotid Bruits.  6. Symmetrical Chest wall movement, Good air movement bilaterally, CTAB.  7. RRR, No Gallops, Rubs or Murmurs, No Parasternal Heave.  8. Positive Bowel Sounds, Abdomen Soft, No tenderness, No organomegaly appriciated,No rebound -guarding or rigidity.  9.  No Cyanosis, Normal Skin Turgor, No Skin Rash or Bruise.  10. Good muscle tone,  joints appear normal , no effusions, Normal ROM.  11. No Palpable Lymph Nodes in Neck or Axillae     Data Review:    CBC  Recent Labs Lab 11/30/15 1220  WBC 10.4  HGB 11.6*  HCT 32.9*  PLT 231  MCV 88.4  MCH 31.2  MCHC 35.3  RDW 13.4  LYMPHSABS 1.1  MONOABS 0.7  EOSABS 0.1  BASOSABS 0.1   ------------------------------------------------------------------------------------------------------------------  Chemistries   Recent Labs Lab 11/30/15 1220  NA 126*  K 4.5  CL 91*  CO2 20*  GLUCOSE 133*  BUN 63*  CREATININE 9.41*  CALCIUM 7.0*   ------------------------------------------------------------------------------------------------------------------ estimated creatinine clearance is 10.2 mL/min (by C-G formula based on Cr of 9.41). ------------------------------------------------------------------------------------------------------------------ No results for input(s): TSH, T4TOTAL, T3FREE, THYROIDAB in the last 72 hours.  Invalid input(s): FREET3  Coagulation profile No results for input(s): INR, PROTIME in the last 168 hours. ------------------------------------------------------------------------------------------------------------------- No results for input(s): DDIMER in the last 72 hours. -------------------------------------------------------------------------------------------------------------------  Cardiac Enzymes No results for input(s): CKMB, TROPONINI, MYOGLOBIN in the last 168 hours.  Invalid input(s):  CK ------------------------------------------------------------------------------------------------------------------ No results found for: BNP   ---------------------------------------------------------------------------------------------------------------  Urinalysis    Component Value Date/Time   COLORURINE YELLOW 05/12/2015 [place] 05/12/2015 1308   LABSPEC 1.020 05/12/2015 1308   PHURINE 5.5 05/12/2015 1308   GLUCOSEU NEGATIVE 05/12/2015 1308   HGBUR SMALL* 05/12/2015 1308   [place] 05/12/2015 1308   [place] 05/12/2015 1308   PROTEINUR 30* 05/12/2015 1308   UROBILINOGEN 0.2 04/11/2010 2336   NITRITE NEGATIVE 05/12/2015 1308   LEUKOCYTESUR NEGATIVE 05/12/2015 1308    ----------------------------------------------------------------------------------------------------------------   Imaging Results:    No results found.    Assessment & Plan:  Active Problems:   RENAL FAILURE, ACUTE   CKD (chronic kidney disease) stage 3, GFR 30-59 ml/min   Hyponatremia   Anemia    1. Acute renal failure Check ua, urine sodium, urine creatinine, urine eosinophils Check renal ultrasound. Hydrate with ns iv  2.  Anemia Check iron studies, b12, folate, tsh, spep, upep Check cbc in am  3. Hyponatremia Check serum osm, cortisol, tsh, check urine sodium, urine osm Hydrate with ns iv  4.  Hypocalcemia Check lft, ionized calcium  5.  Hyperglycemia Check hga1c  6.  Edema,   Check cardiac echo.    DVT Prophylaxis Heparin - SCDs   AM Labs Ordered, also please review Full Orders  Family Communication: Admission, patients condition and plan of care including tests being ordered have been discussed with the patient  who indicate understanding and agree with the plan and Code Status.  Code Status  Full Code  Likely DC to  home  Condition GUARDED    Consults called: nephrology by ED  Admission status: inpatient  Time spent in  minutes : 45 minutes   Jani Gravel M.D on 11/30/2015 at 3:24 PM  Between 7am to 7pm - Pager - [phone]. After 7pm go to [email or web address] - password [facility]  [facility] - Office  [phone]

## 2015-11-30 NOTE — ED Notes (Signed)
Pt was sent here by Dr. Hilma Favors for referral for dailysis. Pt had abnormal labs at PCP's office which showed he may need dialysis. Pt is not a dialysis patient, states he had dialysis done several years ago.

## 2015-11-30 NOTE — Consult Note (Addendum)
Reason for Consult: Acute kidney injury superimposed on chronic and leg edema Referring Physician: [facility]  Steven Sellers is an 55 y.o. male.  HPI: He is a patient who has history of pulmonary embolism, muscular dystrophy, acute kidney injury thought to be secondary to contrast-induced about 6 years ago. During that time patient also developed respiratory failure hence transferred to [facility]. Patient requires 3 days of dialysis if his renal function recovers. Since his discharge patient seems to have lost for follow-up. The last time his creatinine was checked by his primary care care physician was on 05/13/15 and creatinine during that time was 1.87 with a GFR of 39 mL/m hence was stage III. Presently patient went to his primary care physician because of increasing leg swelling. When blood work was done his creatinine was found to have significantly increased hence sent to emergency room. Patient presently denies any nausea or vomiting. His appetite is good and no difficulty breathing.  Past Medical History  Diagnosis Date  . Hypertension   . Alcohol use ([place])   . Tobacco use   . Pulmonary embolism ([place]) 2011  . Vision abnormalities   . Muscular dystrophy [facility])     Past Surgical History  Procedure Laterality Date  . Cardiac catheterization  2002    normal coronary arteries    Family History  Problem Relation Age of Onset  . Hypertension Mother   . Heart attack Father     Social History:  reports that he has been smoking Cigarettes.  He started smoking about 41 years ago. He has been smoking about 1.00 pack per day. He does not have any smokeless tobacco history on file. He reports that he drinks alcohol. He reports that he does not use illicit drugs.  Allergies:  Allergies  Allergen Reactions  . Other Other (See Comments)    IV contrast- Renal issues    Medications: I have reviewed the patient's current medications.  Results for orders placed or  performed during the hospital encounter of 11/30/15 (from the past 48 hour(s))  CBC with Differential     Status: Abnormal   Collection Time: 11/30/15 12:20 PM  Result Value Ref Range   WBC 10.4 4.0 - 10.5 K/uL   RBC 3.72 (L) 4.22 - 5.81 MIL/uL   Hemoglobin 11.6 (L) 13.0 - 17.0 g/dL   HCT 32.9 (L) 39.0 - 52.0 %   MCV 88.4 78.0 - 100.0 fL   MCH 31.2 26.0 - 34.0 pg   MCHC 35.3 30.0 - 36.0 g/dL   RDW 13.4 11.5 - 15.5 %   Platelets 231 150 - 400 K/uL   Neutrophils Relative % 81 %   Neutro Abs 8.5 (H) 1.7 - 7.7 K/uL   Lymphocytes Relative 10 %   Lymphs Abs 1.1 0.7 - 4.0 K/uL   Monocytes Relative 7 %   Monocytes Absolute 0.7 0.1 - 1.0 K/uL   Eosinophils Relative 1 %   Eosinophils Absolute 0.1 0.0 - 0.7 K/uL   Basophils Relative 1 %   Basophils Absolute 0.1 0.0 - 0.1 K/uL  Basic metabolic panel     Status: Abnormal   Collection Time: 11/30/15 12:20 PM  Result Value Ref Range   Sodium 126 (L) 135 - 145 mmol/L   Potassium 4.5 3.5 - 5.1 mmol/L   Chloride 91 (L) 101 - 111 mmol/L   CO2 20 (L) 22 - 32 mmol/L   Glucose, Bld 133 (H) 65 - 99 mg/dL   BUN 63 (  H) 6 - 20 mg/dL   Creatinine, Ser 9.41 (H) 0.61 - 1.24 mg/dL   Calcium 7.0 (L) 8.9 - 10.3 mg/dL   GFR calc non Af Amer 6 (L) >60 mL/min   GFR calc Af Amer 6 (L) >60 mL/min    Comment: (NOTE) The eGFR has been calculated using the CKD EPI equation. This calculation has not been validated in all clinical situations. eGFR's persistently <60 mL/min signify possible Chronic Kidney Disease.    Anion gap 15 5 - 15    No results found.  Review of Systems  Constitutional: Negative for fever and chills.  Respiratory: Negative for sputum production and shortness of breath.   Cardiovascular: Positive for leg swelling. Negative for orthopnea.  Gastrointestinal: Negative for nausea, vomiting, abdominal pain and diarrhea.  Neurological: Positive for weakness.   Blood pressure 185/96, pulse 62, temperature 98.6 F (37 C), temperature source  Oral, resp. rate 19, height _0  (1.88 m), weight 81.647 kg (180 lb), SpO2 96 %. Physical Exam  Constitutional: He is oriented to person, place, and time. No distress.  HENT:  Mouth/Throat: No oropharyngeal exudate.  Neck: No JVD present.  Cardiovascular: Normal rate and regular rhythm.   No murmur heard. Respiratory: He has no wheezes. He has no rales.  GI: He exhibits no distension. There is no tenderness.  Musculoskeletal: He exhibits edema.  Neurological: He is alert and oriented to person, place, and time.    Assessment/Plan: Problem #1 renal failure possibly acute on chronic versus natural progression of his disease. His creatinine is significantly high from his baseline creatinine of about 1.87 in January of systems. Etiology not clear possibly ATN. Patient presently denies any history of contrast administration versus NSAIDS use. Patient is asymptomatic. Problem #2 leg swelling: Very significant and worsening the last couple of times. Patient however denies any difficulty breathing. Problem #3 history of muscular dystrophy Problem #4 history of hypertension: His blood pressure is reasonably controlled Problem #5 history of pulmonary polys and Problem #6 hyponatremia: Hypervolemic hyponatremia Problem #7 history of unequal kidney. The last time ultrasound was done right kidney was 12.7 and a left kidney was 10.4 Problem #8 history of anti-lupus antibody positive Plan: Will start patient on Lasix and a 60 mg IV twice a day 2] we'll check CPK, comprehensive metabolic panel in the morning 3] we'll check urine sodium, osmolality, creatinine 4] if his renal function doesn't improve we'll consider dialysis presently however patient does not require dialysis. 5] we'll check ultrasound of his kidneys.  Rockland Kotarski S 11/30/2015, 2:51 PM

## 2015-11-30 NOTE — ED Provider Notes (Signed)
CSN: [id number]     Arrival date & time 11/30/15  1111 History  By signing my name below, I, Rayna Sexton, attest that this documentation has been prepared under the direction and in the presence of Nat Christen, MD. Electronically Signed: Rayna Sexton, ED Scribe. 11/30/2015. 1:31 PM.  Chief Complaint  Patient presents with  . Abnormal Labs    The history is provided by the patient and the spouse. No language interpreter was used.    HPI Comments: Steven Sellers is a 55 y.o. male with a PMHx of muscular dystrophy who presents to the Emergency Department due to abnormal lab work. Pt was sent to the ED by Dr. Hilma Favors due to abnormal kidney labs which showed pt may need dialysis. He states that he experienced a PE ~7 years ago and came to the ED and the contrast that he received had a negative effect on his kidneys resulting in him being admitted and receiving dialysis at the time. He denies having needed ongoing dialysis and 3 weeks ago notes he fell in the yard and was evaluated and told to f/u with his PCP. During evaluation by his PCP he notes that he was experiencing moderate, diffuse, swelling and based on his lab work was told he is experiencing renal failure. He reports associated decreased urination (3x urination today; ~4 oz with each occurrence). He confirms his listed PMHx. Pt denies appetite changes.   Past Medical History  Diagnosis Date  . Hypertension   . Alcohol use ([place])   . Tobacco use   . Pulmonary embolism ([place]) 2011  . Vision abnormalities   . Muscular dystrophy [facility])    Past Surgical History  Procedure Laterality Date  . Cardiac catheterization  2002    normal coronary arteries   Family History  Problem Relation Age of Onset  . Hypertension Mother   . Heart attack Father    Social History  Substance Use Topics  . Smoking status: Current Every Day Smoker -- 1.00 packs/day    Types: Cigarettes    Start date: 05/12/1974  . Smokeless tobacco: None  . Alcohol  Use: 14.4 oz/week    24 Standard drinks or equivalent per week     Comment: occ    Review of Systems  Constitutional: Negative for appetite change.  Cardiovascular: Positive for leg swelling.  Genitourinary: Positive for difficulty urinating. Negative for urgency and frequency.  Musculoskeletal: Positive for joint swelling.  All other systems reviewed and are negative.  Allergies  Other  Home Medications   Prior to Admission medications   Medication Sig Start Date End Date Taking? Authorizing Provider  diltiazem (TIAZAC) 180 MG 24 hr capsule Take 180 mg by mouth daily.  11/28/15  Yes Historical Provider, MD  ibuprofen (ADVIL,MOTRIN) 600 MG tablet Take 1 tablet (600 mg total) by mouth 3 (three) times daily. Take with food Patient not taking: Reported on 11/30/2015 11/10/15   Tammy Triplett, PA-C   BP 155/90 mmHg  Pulse 63  Temp(Src) 98.6 F (37 C) (Oral)  Resp 19  Ht 6\' 2"  (1.88 m)  Wt 180 lb (81.647 kg)  BMI 23.10 kg/m2  SpO2 95%    Physical Exam  Constitutional: He is oriented to person, place, and time. He appears well-developed and well-nourished.  HENT:  Head: Normocephalic and atraumatic.  Eyes: Conjunctivae are normal.  Neck: Neck supple.  Cardiovascular: Normal rate and regular rhythm.   Pulmonary/Chest: Effort normal and breath sounds normal.  Abdominal: Soft. Bowel sounds are normal.  Musculoskeletal: Normal range of motion. He exhibits edema.  3+ peripheral edema and mild edema to bilateral forearms and hands  Neurological: He is alert and oriented to person, place, and time.  Skin: Skin is warm and dry.  Psychiatric: He has a normal mood and affect. His behavior is normal.  Nursing note and vitals reviewed.   ED Course  Procedures  DIAGNOSTIC STUDIES: Oxygen Saturation is 99% on RA, normal by my interpretation.    COORDINATION OF CARE: 1:27 PM Discussed next steps with pt. Pt verbalized understanding and is agreeable with the plan.   Labs Review Labs  Reviewed  CBC WITH DIFFERENTIAL/PLATELET - Abnormal; Notable for the following:    RBC 3.72 (*)    Hemoglobin 11.6 (*)    HCT 32.9 (*)    Neutro Abs 8.5 (*)    All other components within normal limits  BASIC METABOLIC PANEL - Abnormal; Notable for the following:    Sodium 126 (*)    Chloride 91 (*)    CO2 20 (*)    Glucose, Bld 133 (*)    BUN 63 (*)    Creatinine, Ser 9.41 (*)    Calcium 7.0 (*)    GFR calc non Af Amer 6 (*)    GFR calc Af Amer 6 (*)    All other components within normal limits  CREATININE, URINE, RANDOM  SODIUM, URINE, RANDOM  URINALYSIS, ROUTINE W REFLEX MICROSCOPIC (NOT AT [facility])  OSMOLALITY  OSMOLALITY, URINE  CK    Imaging Review No results found. I have personally reviewed and evaluated these images and lab results as part of my medical decision-making.   EKG Interpretation None      MDM   Final diagnoses:  Renal failure    Creatinine has increased from 1.87   6 months ago to 9.41 today. He is edematous in his extremities. No obvious dyspnea. Discussed with nephrologist. Admit to general medicine.  I personally performed the services described in this documentation, which was scribed in my presence. The recorded information has been reviewed and is accurate.     Nat Christen, MD 11/30/15 1538

## 2015-12-01 DIAGNOSIS — D638 Anemia in other chronic diseases classified elsewhere: Secondary | ICD-10-CM

## 2015-12-01 LAB — CBC
HCT: 28.9 % — ABNORMAL LOW (ref 39.0–52.0)
Hemoglobin: 10.2 g/dL — ABNORMAL LOW (ref 13.0–17.0)
MCH: 31.6 pg (ref 26.0–34.0)
MCHC: 35.3 g/dL (ref 30.0–36.0)
MCV: 89.5 fL (ref 78.0–100.0)
PLATELETS: 170 10*3/uL (ref 150–400)
RBC: 3.23 MIL/uL — ABNORMAL LOW (ref 4.22–5.81)
RDW: 13.5 % (ref 11.5–15.5)
WBC: 8 10*3/uL (ref 4.0–10.5)

## 2015-12-01 LAB — IRON AND TIBC
Iron: 53 ug/dL (ref 45–182)
SATURATION RATIOS: 25 % (ref 17.9–39.5)
TIBC: 209 ug/dL — AB (ref 250–450)
UIBC: 156 ug/dL

## 2015-12-01 LAB — COMPREHENSIVE METABOLIC PANEL
ALBUMIN: 2.2 g/dL — AB (ref 3.5–5.0)
ALT: 19 U/L (ref 17–63)
ANION GAP: 14 (ref 5–15)
AST: 12 U/L — ABNORMAL LOW (ref 15–41)
Alkaline Phosphatase: 61 U/L (ref 38–126)
BUN: 66 mg/dL — ABNORMAL HIGH (ref 6–20)
CHLORIDE: 93 mmol/L — AB (ref 101–111)
CO2: 21 mmol/L — AB (ref 22–32)
CREATININE: 9.35 mg/dL — AB (ref 0.61–1.24)
Calcium: 6.7 mg/dL — ABNORMAL LOW (ref 8.9–10.3)
GFR calc non Af Amer: 6 mL/min — ABNORMAL LOW (ref >60)
GFR, EST AFRICAN AMERICAN: 6 mL/min — AB (ref >60)
GLUCOSE: 107 mg/dL — AB (ref 65–99)
Potassium: 4 mmol/L (ref 3.5–5.1)
SODIUM: 128 mmol/L — AB (ref 135–145)
Total Bilirubin: 0.5 mg/dL (ref 0.3–1.2)
Total Protein: 5.1 g/dL — ABNORMAL LOW (ref 6.5–8.1)

## 2015-12-01 LAB — FERRITIN: Ferritin: 108 ng/mL (ref 24–336)

## 2015-12-01 LAB — PHOSPHORUS: PHOSPHORUS: 12.5 mg/dL — AB (ref 2.5–4.6)

## 2015-12-01 LAB — VITAMIN B12: VITAMIN B 12: 225 pg/mL (ref 180–914)

## 2015-12-01 LAB — OSMOLALITY: OSMOLALITY: 287 mosm/kg (ref 275–295)

## 2015-12-01 LAB — TSH: TSH: 2.537 u[IU]/mL (ref 0.350–4.500)

## 2015-12-01 LAB — MAGNESIUM: Magnesium: 2.1 mg/dL (ref 1.7–2.4)

## 2015-12-01 MED ORDER — FUROSEMIDE 10 MG/ML IJ SOLN
200.0000 mg | Freq: Two times a day (BID) | INTRAVENOUS | Status: DC
  Administered 2015-12-01 – 2015-12-06 (×8): 200 mg via INTRAVENOUS
  Filled 2015-12-01 (×12): qty 20

## 2015-12-01 MED ORDER — METOLAZONE 5 MG PO TABS
5.0000 mg | ORAL_TABLET | Freq: Two times a day (BID) | ORAL | Status: DC
  Administered 2015-12-01 – 2015-12-06 (×11): 5 mg via ORAL
  Filled 2015-12-01 (×11): qty 1

## 2015-12-01 NOTE — Progress Notes (Signed)
Subjective: Interval History: Patient denies any nausea or vomiting. He has some exertional dyspnea and also some cough. Presently he denies any difficulty breathing and orthopnea.  Objective: Vital signs in last 24 hours: Temp:  [98.1 F (36.7 C)-98.6 F (37 C)] 98.1 F (36.7 C) (07/22 0620) Pulse Rate:  [56-73] 62 (07/22 0620) Resp:  [19-21] 20 (07/22 0620) BP: (140-185)/(76-96) 167/88 mmHg (07/22 0620) SpO2:  [95 %-100 %] 98 % (07/22 0620) Weight:  [81.647 kg (180 lb)-83.5 kg (184 lb 1.4 oz)] 83.144 kg (183 lb 4.8 oz) (07/22 0620) Weight change:   Intake/Output from previous day: 07/21 0701 - 07/22 0700 In: 120 [P.O.:120] Out: 650 [Urine:650] Intake/Output this shift:    General appearance: alert, cooperative and no distress Resp: diminished breath sounds posterior - bilateral and rales posterior - bilateral Cardio: regular rate and rhythm GI: soft, non-tender; bowel sounds normal; no masses,  no organomegaly Extremities: edema 2-3+ edema  Lab Results:  Recent Labs  11/30/15 1220 12/01/15 0500  WBC 10.4 8.0  HGB 11.6* 10.2*  HCT 32.9* 28.9*  PLT 231 170   BMET:  Recent Labs  11/30/15 1220 12/01/15 0500  NA 126* 128*  K 4.5 4.0  CL 91* 93*  CO2 20* 21*  GLUCOSE 133* 107*  BUN 63* 66*  CREATININE 9.41* 9.35*  CALCIUM 7.0* 6.7*   No results for input(s): PTH in the last 72 hours. Iron Studies: No results for input(s): IRON, TIBC, TRANSFERRIN, FERRITIN in the last 72 hours.  Studies/Results: US Renal  11/30/2015  CLINICAL DATA:  Acute kidney injury EXAM: RENAL / URINARY TRACT ULTRASOUND COMPLETE COMPARISON:  MRI abdomen 04/07/2010 FINDINGS: Right Kidney: Length: 10.7 cm.  No mass or hydronephrosis. Left Kidney: Length: 10.5 cm.  No mass or hydronephrosis. Bladder: Within normal limits. IMPRESSION: Negative renal ultrasound. Electronically Signed   By: Julian Hy M.D.   On: 11/30/2015 15:36    I have reviewed the patient's current  medications.  Assessment/Plan: Problem #1 renal failure: Possibly acute on chronic. His renal function at this moment is stable with possibly slight improvement. Patient has this moment denies any uremic signs or symptoms. Problem #2 leg swelling: Presently he is on Lasix. Patient had about 600 mL of urine output still with significant edema. Problem #3 hypervolemic hyponatremia: Sodium 128 improving. Problem #4 anemia: His hemoglobin is declining. Presently seems to be within our target goal. Problem #5 chronic renal failure: stage III  Problem #6 history of pulmonary embolism Problem #7 hyperkalemia: His potassium has corrected Problem #8 history of muscular dystrophy Plan: 1] We'll increase Lasix to 200 mg IV twice a day 2] will add metolazone 5 mg by mouth twice a day 3] will check renal panel in the morning 4] he was renal function doesn't show any improvement or if diuretics doesn't seem to work ,we'll consider starting him on dialysis. I have discussed with the patient and seems to be agreeable.   LOS: 1 day   Caelyn Route S 12/01/2015,10:13 AM

## 2015-12-01 NOTE — Progress Notes (Signed)
PROGRESS NOTE                                                                                                                                                                                                             Patient Demographics:    Steven Sellers, is a 55 y.o. male, DOB - 1960/10/26, [id number]  Admit date - 11/30/2015   Admitting Physician Jani Gravel, MD  Outpatient Primary MD for the patient is [name], MD  LOS - 1  Chief Complaint  Patient presents with  . Abnormal Labs        Brief Narrative    Steven Sellers is a 55 y.o. male, w Hypertension, apparently had labs done by pcp which indicated renal failure and sent to ER for evaluation. Pt notes slight swelling in the legs. Pt denies cp, palp, sob, orthopnea. Pt notes being started on tiazac recently but otherwise no new medication. Pt denies nsaid use. Pt denies any iv dye exposure. Pt denies diarrhea or dehydration but does not working outside.   In ED, Pt found to have ARF, w Bun 63, Creatinine 9.41. Pt also noted to be mildly hyponatremic. Na=126, and calcium 7.0 but albumin not available. pt also noted to be mildly anemic. Hgb =11.6 Pt denies brbpr, black stool. Pt will be admitted for acute on chronic renal failure, hyponatremia, and anemia, and possible hypocalcemia.    Subjective:    Steven Sellers today has, No headache, No chest pain, No abdominal pain - No Nausea, No new weakness tingling or numbness, No Cough - SOB.    Assessment  & Plan :     1.Renal failure - ARF on CKD 4 - baseline creatinine around 1.8, he does have history of acute renal failure requiring dialysis several years ago after contrast related injury, urine output was 650 mL in the last 24 hours, on high-dose Lasix, renal following, renal ultrasound nonacute, will defer management to nephrology. Patient has been told that he might end up on dialysis.  Currently he has good appetite, potassium is stable and he is not short of breath.  2. Hyponatremia. Due to fluid overload. Diuresis and monitor.  3. AOCD - stable.  4. HTN - On Diltiazem and Metoprolol.    Family Communication  :  Wife bedside  Code Status :  Full  Diet :  Renal with fluid restriction  Disposition Plan  :  Stay in-house  Consults  : Renal  Procedures  :   Renal ultrasound stable  DVT Prophylaxis  :    Heparin   Lab Results  Component Value Date   PLT 170 12/01/2015    Inpatient Medications  Scheduled Meds: . diltiazem  180 mg Oral Daily  . furosemide  160 mg Intravenous BID  . heparin  5,000 Units Subcutaneous Q8H  . metoprolol tartrate  50 mg Oral BID  . sodium chloride flush  3 mL Intravenous Q12H   Continuous Infusions:  PRN Meds:.acetaminophen **OR** acetaminophen, hydrALAZINE  Antibiotics  :    Anti-infectives    None         Objective:   Filed Vitals:   11/30/15 1722 11/30/15 1900 11/30/15 2155 12/01/15 0620  BP: 140/89  158/90 167/88  Pulse: 57  73 62  Temp: 98.4 F (36.9 C)  98.6 F (37 C) 98.1 F (36.7 C)  TempSrc: Oral  Oral Oral  Resp: 20  20 20   Height:  6\' 2"  (1.88 m)    Weight:  83.5 kg (184 lb 1.4 oz)  83.144 kg (183 lb 4.8 oz)  SpO2: 97%  96% 98%    Wt Readings from Last 3 Encounters:  12/01/15 83.144 kg (183 lb 4.8 oz)  11/09/15 74.844 kg (165 lb)  06/07/15 75.297 kg (166 lb)     Intake/Output Summary (Last 24 hours) at 12/01/15 1009 Last data filed at 12/01/15 0500  Gross per 24 hour  Intake    120 ml  Output    650 ml  Net   -530 ml     Physical Exam  Awake Alert, Oriented X 3, No new F.N deficits, Normal affect [place].AT,PERRAL Supple Neck,No JVD, No cervical lymphadenopathy appriciated.  Symmetrical Chest wall movement, Good air movement bilaterally, few rales RRR,No Gallops,Rubs or new Murmurs, No Parasternal Heave +ve B.Sounds, Abd Soft, No tenderness, No organomegaly appriciated, No rebound  - guarding or rigidity. No Cyanosis, Clubbing, 2+  edema, No new Rash or bruise      Data Review:    CBC  Recent Labs Lab 11/30/15 1220 12/01/15 0500  WBC 10.4 8.0  HGB 11.6* 10.2*  HCT 32.9* 28.9*  PLT 231 170  MCV 88.4 89.5  MCH 31.2 31.6  MCHC 35.3 35.3  RDW 13.4 13.5  LYMPHSABS 1.1  --   MONOABS 0.7  --   EOSABS 0.1  --   BASOSABS 0.1  --     Chemistries   Recent Labs Lab 11/30/15 1220 12/01/15 0500  NA 126* 128*  K 4.5 4.0  CL 91* 93*  CO2 20* 21*  GLUCOSE 133* 107*  BUN 63* 66*  CREATININE 9.41* 9.35*  CALCIUM 7.0* 6.7*  MG  --  2.1  AST 16 12*  ALT 23 19  ALKPHOS 78 61  BILITOT 0.6 0.5   ------------------------------------------------------------------------------------------------------------------ No results for input(s): CHOL, HDL, LDLCALC, TRIG, CHOLHDL, LDLDIRECT in the last 72 hours.  Lab Results  Component Value Date   HGBA1C  04/16/2010    5.5 (NOTE)  According to the ADA Clinical Practice Recommendations for 2011, when HbA1c is used as a screening test:   >=6.5%   Diagnostic of Diabetes Mellitus           (if abnormal result  is confirmed)  5.7-6.4%   Increased risk of developing Diabetes Mellitus  References:Diagnosis and Classification of Diabetes Mellitus,Diabetes [id number] 1):S62-S69 and Standards of Medical Care in         Diabetes - 2011,Diabetes [id number]  (Suppl 1):S11-S61.   ------------------------------------------------------------------------------------------------------------------  Recent Labs  12/01/15 0500  TSH 2.537   ------------------------------------------------------------------------------------------------------------------ No results for input(s): VITAMINB12, FOLATE, FERRITIN, TIBC, IRON, RETICCTPCT in the last 72 hours.  Coagulation profile No results for input(s): INR, PROTIME in the last 168 hours.  No results for  input(s): DDIMER in the last 72 hours.  Cardiac Enzymes No results for input(s): CKMB, TROPONINI, MYOGLOBIN in the last 168 hours.  Invalid input(s): CK ------------------------------------------------------------------------------------------------------------------ No results found for: BNP  Micro Results No results found for this or any previous visit (from the past 240 hour(s)).  Radiology Reports US Renal  11/30/2015  CLINICAL DATA:  Acute kidney injury EXAM: RENAL / URINARY TRACT ULTRASOUND COMPLETE COMPARISON:  MRI abdomen 04/07/2010 FINDINGS: Right Kidney: Length: 10.7 cm.  No mass or hydronephrosis. Left Kidney: Length: 10.5 cm.  No mass or hydronephrosis. Bladder: Within normal limits. IMPRESSION: Negative renal ultrasound. Electronically Signed   By: Julian Hy M.D.   On: 11/30/2015 15:36     Time Spent in minutes  30   Tranisha Tissue K M.D on 12/01/2015 at 10:09 AM  Between 7am to 7pm - Pager - [phone]  After 7pm go to [email or web address] - password [facility]  [facility] -  Office  [phone]

## 2015-12-02 ENCOUNTER — Inpatient Hospital Stay (HOSPITAL_COMMUNITY): Payer: BLUE CROSS/BLUE SHIELD

## 2015-12-02 LAB — RENAL FUNCTION PANEL
ANION GAP: 16 — AB (ref 5–15)
Albumin: 2.4 g/dL — ABNORMAL LOW (ref 3.5–5.0)
Albumin: 2.6 g/dL — ABNORMAL LOW (ref 3.5–5.0)
Anion gap: 13 (ref 5–15)
BUN: 70 mg/dL — ABNORMAL HIGH (ref 6–20)
BUN: 72 mg/dL — ABNORMAL HIGH (ref 6–20)
CALCIUM: 7 mg/dL — AB (ref 8.9–10.3)
CALCIUM: 6.8 mg/dL — AB (ref 8.9–10.3)
CHLORIDE: 94 mmol/L — AB (ref 101–111)
CO2: 19 mmol/L — ABNORMAL LOW (ref 22–32)
CO2: 18 mmol/L — AB (ref 22–32)
CREATININE: 9.49 mg/dL — AB (ref 0.61–1.24)
CREATININE: 9.65 mg/dL — AB (ref 0.61–1.24)
Chloride: 93 mmol/L — ABNORMAL LOW (ref 101–111)
GFR calc non Af Amer: 5 mL/min — ABNORMAL LOW (ref >60)
GFR, EST AFRICAN AMERICAN: 6 mL/min — AB (ref >60)
GFR, EST AFRICAN AMERICAN: 6 mL/min — AB (ref >60)
GFR, EST NON AFRICAN AMERICAN: 5 mL/min — AB (ref >60)
GLUCOSE: 98 mg/dL (ref 65–99)
Glucose, Bld: 99 mg/dL (ref 65–99)
Phosphorus: 12.2 mg/dL — ABNORMAL HIGH (ref 2.5–4.6)
Phosphorus: 12.3 mg/dL — ABNORMAL HIGH (ref 2.5–4.6)
Potassium: 4.4 mmol/L (ref 3.5–5.1)
Potassium: 4.2 mmol/L (ref 3.5–5.1)
SODIUM: 128 mmol/L — AB (ref 135–145)
SODIUM: 125 mmol/L — AB (ref 135–145)

## 2015-12-02 LAB — CBC
HCT: 31.4 % — ABNORMAL LOW (ref 39.0–52.0)
HEMOGLOBIN: 11 g/dL — AB (ref 13.0–17.0)
MCH: 31.4 pg (ref 26.0–34.0)
MCHC: 35 g/dL (ref 30.0–36.0)
MCV: 89.7 fL (ref 78.0–100.0)
PLATELETS: 195 10*3/uL (ref 150–400)
RBC: 3.5 MIL/uL — AB (ref 4.22–5.81)
RDW: 12.8 % (ref 11.5–15.5)
WBC: 8.1 10*3/uL (ref 4.0–10.5)

## 2015-12-02 LAB — CALCIUM, IONIZED: CALCIUM, IONIZED, SERUM: 3.7 mg/dL — AB (ref 4.5–5.6)

## 2015-12-02 LAB — ALT: ALT: 17 U/L (ref 17–63)

## 2015-12-02 MED ORDER — TUBERCULIN PPD 5 UNIT/0.1ML ID SOLN
5.0000 [IU] | Freq: Once | INTRADERMAL | Status: AC
  Administered 2015-12-02: 5 [IU] via INTRADERMAL
  Filled 2015-12-02: qty 0.1

## 2015-12-02 MED ORDER — SEVELAMER CARBONATE 800 MG PO TABS
3200.0000 mg | ORAL_TABLET | Freq: Three times a day (TID) | ORAL | Status: DC
  Administered 2015-12-03: 1600 mg via ORAL
  Administered 2015-12-03 – 2015-12-06 (×5): 3200 mg via ORAL
  Filled 2015-12-02 (×8): qty 4

## 2015-12-02 MED ORDER — STERILE WATER FOR INJECTION IJ SOLN
INTRAMUSCULAR | Status: AC
  Administered 2015-12-02: 10 mL
  Filled 2015-12-02: qty 10

## 2015-12-02 MED ORDER — HEPARIN SODIUM (PORCINE) 1000 UNIT/ML DIALYSIS: 1000.0000 [IU] | INTRAMUSCULAR | Status: DC | PRN

## 2015-12-02 MED ORDER — SODIUM CHLORIDE 0.9 % IV SOLN: 100.0000 mL | INTRAVENOUS | Status: DC | PRN

## 2015-12-02 MED ORDER — LIDOCAINE-PRILOCAINE 2.5-2.5 % EX CREA: 1.0000 "application " | TOPICAL_CREAM | CUTANEOUS | Status: DC | PRN

## 2015-12-02 MED ORDER — LIDOCAINE HCL (PF) 1 % IJ SOLN: 5.0000 mL | INTRAMUSCULAR | Status: DC | PRN

## 2015-12-02 MED ORDER — ALTEPLASE 2 MG IJ SOLR
INTRAMUSCULAR | Status: AC
  Administered 2015-12-02: 4 mg
  Filled 2015-12-02: qty 2

## 2015-12-02 MED ORDER — DILTIAZEM HCL ER COATED BEADS 180 MG PO CP24
180.0000 mg | ORAL_CAPSULE | Freq: Every day | ORAL | Status: DC
  Administered 2015-12-02 – 2015-12-06 (×4): 180 mg via ORAL
  Filled 2015-12-02 (×4): qty 1

## 2015-12-02 MED ORDER — PENTAFLUOROPROP-TETRAFLUOROETH EX AERO: 1.0000 "application " | INHALATION_SPRAY | CUTANEOUS | Status: DC | PRN

## 2015-12-02 MED ORDER — ALTEPLASE 2 MG IJ SOLR
2.0000 mg | Freq: Once | INTRAMUSCULAR | Status: AC | PRN
Start: 2015-12-02 — End: 2015-12-02
  Administered 2015-12-02: 4 mg

## 2015-12-02 NOTE — Progress Notes (Signed)
PROGRESS NOTE                                                                                                                                                                                                             Patient Demographics:    Steven Sellers, is a 55 y.o. male, DOB - 1960-12-18, [id number]  Admit date - 11/30/2015   Admitting Physician Jani Gravel, MD  Outpatient Primary MD for the patient is [name], MD  LOS - 2  Chief Complaint  Patient presents with  . Abnormal Labs        Brief Narrative    Steven Sellers is a 55 y.o. male, w Hypertension, apparently had labs done by pcp which indicated renal failure and sent to ER for evaluation. Pt notes slight swelling in the legs. Pt denies cp, palp, sob, orthopnea. Pt notes being started on tiazac recently but otherwise no new medication. Pt denies nsaid use. Pt denies any iv dye exposure. Pt denies diarrhea or dehydration but does not working outside.   In ED, Pt found to have ARF, w Bun 63, Creatinine 9.41. Pt also noted to be mildly hyponatremic. Na=126, and calcium 7.0 but albumin not available. pt also noted to be mildly anemic. Hgb =11.6 Pt denies brbpr, black stool. Pt will be admitted for acute on chronic renal failure, hyponatremia, and anemia, and possible hypocalcemia.    Subjective:    Steven Sellers today has, No headache, No chest pain, No abdominal pain - No Nausea, No new weakness tingling or numbness, No Cough - SOB.    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 Currently he has good appetite, potassium is stable and he is not short of breath.  2. Hyponatremia. Due to fluid overload. Diuresis and monitor.  3. AOCD - stable.  4. HTN - On Diltiazem and Metoprolol.  5. Generalized edema/anasarca due to #1 above. Slightly more significant and left arm. Venous ultrasound L arm ordered.  Family Communication  :  Wife bedside on 12/01/2015  Code Status :  Full  Diet : Renal with fluid restriction  Disposition Plan  :  Stay in-house, dialysis to start  Consults  : Renal  Procedures  :   Renal ultrasound stable  Venous ultrasound left upper extremity   DVT Prophylaxis  :    Heparin   Lab Results  Component Value Date   PLT 170 12/01/2015    Inpatient Medications  Scheduled Meds: . diltiazem  180 mg Oral Daily  . furosemide  200 mg Intravenous BID  . heparin  5,000 Units Subcutaneous Q8H  . metolazone  5 mg Oral BID  . metoprolol tartrate  50 mg Oral BID  . sevelamer carbonate  3,200 mg Oral TID WC  . sodium chloride flush  3 mL Intravenous Q12H  . tuberculin  5 Units Intradermal Once   Continuous Infusions:  PRN Meds:.acetaminophen **OR** acetaminophen, heparin, hydrALAZINE, lidocaine (PF), lidocaine-prilocaine, pentafluoroprop-tetrafluoroeth  Antibiotics  :    Anti-infectives    None         Objective:   Vitals:   12/01/15 0620 12/01/15 1442 12/01/15 2041 12/02/15 0444  BP: (!) 167/88 (!) 178/97 (!) 144/91 (!) 149/89  Pulse: 62 72 (!) 53 (!) 50  Resp: 20 18 20 20   Temp: 98.1 F (36.7 C) 98.6 F (37 C) 98.3 F (36.8 C) 97.8 F (36.6 C)  TempSrc: Oral Oral Oral Oral  SpO2: 98% 96% 96% 98%  Weight: 83.1 kg (183 lb 4.8 oz)   86.7 kg (191 lb 1.6 oz)  Height:        Wt Readings from Last 3 Encounters:  12/02/15 86.7 kg (191 lb 1.6 oz)  11/09/15 74.8 kg (165 lb)  06/07/15 75.3 kg (166 lb)     Intake/Output Summary (Last 24 hours) at 12/02/15 0910 Last data filed at 12/02/15 0830  Gross per  24 hour  Intake              600 ml  Output              150 ml  Net              450 ml     Physical Exam  Awake Alert, Oriented X 3, No new F.N deficits, Normal affect [place].AT,PERRAL Supple Neck,No JVD, No cervical lymphadenopathy appriciated.  Symmetrical Chest wall movement, Good air movement bilaterally, few rales RRR,No Gallops,Rubs or new Murmurs, No Parasternal Heave +ve B.Sounds, Abd Soft, No tenderness, No organomegaly appriciated, No rebound - guarding or rigidity. No Cyanosis, Clubbing, 2+  edema, No new Rash or bruise      Data Review:    CBC  Recent Labs Lab 11/30/15 1220 12/01/15 0500  WBC 10.4 8.0  HGB 11.6* 10.2*  HCT 32.9* 28.9*  PLT 231 170  MCV 88.4 89.5  MCH 31.2 31.6  MCHC 35.3 35.3  RDW 13.4 13.5  LYMPHSABS 1.1  --   MONOABS 0.7  --   EOSABS 0.1  --   BASOSABS 0.1  --     Chemistries   Recent Labs Lab 11/30/15 1220 12/01/15 0500 12/02/15 0653  NA 126* 128* 128*  K 4.5 4.0 4.4  CL 91* 93* 93*  CO2 20* 21* 19*  GLUCOSE 133* 107* 99  BUN 63* 66* 70*  CREATININE 9.41* 9.35* 9.49*  CALCIUM 7.0* 6.7* 7.0*  MG  --  2.1  --   AST 16 12*  --   ALT  23 19  --   ALKPHOS 78 61  --   BILITOT 0.6 0.5  --    ------------------------------------------------------------------------------------------------------------------ No results for input(s): CHOL, HDL, LDLCALC, TRIG, CHOLHDL, LDLDIRECT in the last 72 hours.  Lab Results  Component Value Date   HGBA1C  04/16/2010    5.5 (NOTE)                                                                       According to the ADA Clinical Practice Recommendations for 2011, when HbA1c is used as a screening test:   >=6.5%   Diagnostic of Diabetes Mellitus           (if abnormal result  is confirmed)  5.7-6.4%   Increased risk of developing Diabetes Mellitus  References:Diagnosis and Classification of Diabetes Mellitus,Diabetes [id number] 1):S62-S69 and Standards of Medical Care in          Diabetes - 2011,Diabetes [id number]  (Suppl 1):S11-S61.   ------------------------------------------------------------------------------------------------------------------  Recent Labs  12/01/15 0500  TSH 2.537   ------------------------------------------------------------------------------------------------------------------  Recent Labs  12/01/15 0500  VITAMINB12 225  FERRITIN 108  TIBC 209*  IRON 53    Coagulation profile No results for input(s): INR, PROTIME in the last 168 hours.  No results for input(s): DDIMER in the last 72 hours.  Cardiac Enzymes No results for input(s): CKMB, TROPONINI, MYOGLOBIN in the last 168 hours.  Invalid input(s): CK ------------------------------------------------------------------------------------------------------------------ No results found for: BNP  Micro Results No results found for this or any previous visit (from the past 240 hour(s)).  Radiology Reports US Renal  11/30/2015  CLINICAL DATA:  Acute kidney injury EXAM: RENAL / URINARY TRACT ULTRASOUND COMPLETE COMPARISON:  MRI abdomen 04/07/2010 FINDINGS: Right Kidney: Length: 10.7 cm.  No mass or hydronephrosis. Left Kidney: Length: 10.5 cm.  No mass or hydronephrosis. Bladder: Within normal limits. IMPRESSION: Negative renal ultrasound. Electronically Signed   By: Julian Hy M.D.   On: 11/30/2015 15:36     Time Spent in minutes  30   Aneesah Hernan K M.D on 12/02/2015 at 9:10 AM  Between 7am to 7pm - Pager - [phone]  After 7pm go to [email or web address] - password [facility]  [facility] -  Office  [phone]

## 2015-12-02 NOTE — Progress Notes (Signed)
Subjective: Interval History: Patient complains of increasing leg swelling. He denies however any difficulty breathing. He denies also any nausea or vomiting.  Objective: Vital signs in last 24 hours: Temp:  [97.8 F (36.6 C)-98.6 F (37 C)] 97.8 F (36.6 C) (07/23 0444) Pulse Rate:  [50-72] 50 (07/23 0444) Resp:  [18-20] 20 (07/23 0444) BP: (144-178)/(89-97) 149/89 (07/23 0444) SpO2:  [96 %-98 %] 98 % (07/23 0444) Weight:  [86.7 kg (191 lb 1.6 oz)] 86.7 kg (191 lb 1.6 oz) (07/23 0444) Weight change: 5.035 kg (11 lb 1.6 oz)  Intake/Output from previous day: 07/22 0701 - 07/23 0700 In: 720 [P.O.:720] Out: 250 [Urine:250] Intake/Output this shift: Total I/O In: 120 [P.O.:120] Out: -   General appearance: alert, cooperative and no distress Resp: diminished breath sounds posterior - bilateral and rales posterior - bilateral Cardio: regular rate and rhythm GI: soft, non-tender; bowel sounds normal; no masses,  no organomegaly Extremities: edema 2-3+ edema  Lab Results:  Recent Labs  11/30/15 1220 12/01/15 0500  WBC 10.4 8.0  HGB 11.6* 10.2*  HCT 32.9* 28.9*  PLT 231 170   BMET:   Recent Labs  12/01/15 0500 12/02/15 0653  NA 128* 128*  K 4.0 4.4  CL 93* 93*  CO2 21* 19*  GLUCOSE 107* 99  BUN 66* 70*  CREATININE 9.35* 9.49*  CALCIUM 6.7* 7.0*   No results for input(s): PTH in the last 72 hours. Iron Studies:   Recent Labs  12/01/15 0500  IRON 53  TIBC 209*  FERRITIN 108    Studies/Results: US Renal  Result Date: 11/30/2015 CLINICAL DATA:  Acute kidney injury EXAM: RENAL / URINARY TRACT ULTRASOUND COMPLETE COMPARISON:  MRI abdomen 04/07/2010 FINDINGS: Right Kidney: Length: 10.7 cm.  No mass or hydronephrosis. Left Kidney: Length: 10.5 cm.  No mass or hydronephrosis. Bladder: Within normal limits. IMPRESSION: Negative renal ultrasound. Electronically Signed   By: Julian Hy M.D.   On: 11/30/2015 15:36    I have reviewed the patient's current  medications.  Assessment/Plan: Problem #1 renal failure: Possibly acute on chronic. Presently patient on large amount of Lasix remains oliguric. He denies any nausea or vomiting. Problem #2 leg swelling: Presently he is on Lasix 200 mg IV twice a day and metolazone 5 mg by mouth twice a day. He has only 250 mL over the last 24 hours. His leg edema seems to be increasing. Problem #3 hypervolemic hyponatremia: Sodium 128 improving. Problem #4 anemia: His hemoglobin is declining. Presently seems to be within our target goal. Problem #5 chronic renal failure: stage III  Problem #6 history of pulmonary embolism Problem #7 hyperkalemia: His potassium is normal. Problem #8 history of muscular dystrophy Problem #9 metabolic bone disease: His calcium is range but phosphorus is high. Presently he is not on any binder. Plan: 1] since his renal function doesn't show any improvement and still with significant fluid overload we have discussed about dialysis in patient seems to be agreeable. 2] will ask surgery to put a temporary dialysis catheter and we'll do dialysis today for 2 hours and for 4 hours tomorrow. 3) we'll start patient on Renvela 800 mg 4tablets by mouth 3 times a day with meals. 4] will check renal panel in the morning.   LOS: 2 days   Muhsin Doris S 12/02/2015,8:44 AM

## 2015-12-02 NOTE — Procedures (Signed)
Central Venous Catheter Insertion Procedure Note Steven Sellers [id number] 1960/12/06  Procedure: Insertion of Dialysis Catheter Indications: dialysis  Procedure Details Consent: Risks of procedure as well as the alternatives and risks of each were explained to the (patient/caregiver).  Consent for procedure obtained. Time Out: Verified patient identification, verified procedure, site/side was marked, verified correct patient position, special equipment/implants available, medications/allergies/relevent history reviewed, required imaging and test results available.  Performed  Maximum sterile technique was used including antiseptics, cap, gloves, gown, hand hygiene, mask and sheet. Skin prep: Chlorhexidine; local anesthetic administered A antimicrobial bonded/coated double lumen catheter was placed in the right femoral vein due to patient being a dialysis patient using the Seldinger technique.  Evaluation Blood flow good Complications: No apparent complications Patient did tolerate procedure well.   [name] 12/02/2015, 11:32 AM

## 2015-12-02 NOTE — Procedures (Signed)
   HEMODIALYSIS TREATMENT NOTE:  2 hour HD session attempted via right femoral non-tunneled catheter.  Arterial port would not aspirate, but flushed without resistance.  Venous port aspirated and flushed without resistance.  HD started with lines reversed but catheter was unable to tolerate prescribed Qb of 200cc/min due to excessively negative arterial pressures.  Blood was returned and alteplase was instilled in both ports x45 minutes.  Arterial limb still would not aspirate.  HD resumed (lines reversed) and cath tolerated Qb of 200 for 15 minutes before AP began fluctuating to -242mmHg.  Dr. Lowanda Foster was notified.  HD continued at average Qb 120cc/min with AP -250 until catheter re-occluded.  All blood was returned.  Pt hemodynamically stable throughout HD session.  Total HD time: 1 hour 20 minutes Net UF: 900cc  Report given to Lawernce Keas, Therapist, sports.  Rockwell Alexandria, RN, CDN

## 2015-12-03 LAB — FOLATE RBC
FOLATE, HEMOLYSATE: 337.5 ng/mL
Folate, RBC: 1152 ng/mL (ref >498)
Hematocrit: 29.3 % — ABNORMAL LOW (ref 37.5–51.0)

## 2015-12-03 LAB — RENAL FUNCTION PANEL
ALBUMIN: 2.2 g/dL — AB (ref 3.5–5.0)
ANION GAP: 14 (ref 5–15)
BUN: 70 mg/dL — AB (ref 6–20)
CHLORIDE: 96 mmol/L — AB (ref 101–111)
CO2: 20 mmol/L — AB (ref 22–32)
Calcium: 7 mg/dL — ABNORMAL LOW (ref 8.9–10.3)
Creatinine, Ser: 9.21 mg/dL — ABNORMAL HIGH (ref 0.61–1.24)
GFR calc Af Amer: 7 mL/min — ABNORMAL LOW (ref >60)
GFR calc non Af Amer: 6 mL/min — ABNORMAL LOW (ref >60)
GLUCOSE: 138 mg/dL — AB (ref 65–99)
PHOSPHORUS: 10.8 mg/dL — AB (ref 2.5–4.6)
POTASSIUM: 4 mmol/L (ref 3.5–5.1)
Sodium: 130 mmol/L — ABNORMAL LOW (ref 135–145)

## 2015-12-03 LAB — HEPATITIS B SURFACE ANTIGEN: Hepatitis B Surface Ag: NEGATIVE

## 2015-12-03 LAB — PROTEIN ELECTROPHORESIS, SERUM
A/G Ratio: 1 (ref 0.7–1.7)
ALBUMIN ELP: 2.4 g/dL — AB (ref 2.9–4.4)
ALPHA-1-GLOBULIN: 0.3 g/dL (ref 0.0–0.4)
Alpha-2-Globulin: 0.7 g/dL (ref 0.4–1.0)
BETA GLOBULIN: 0.9 g/dL (ref 0.7–1.3)
GAMMA GLOBULIN: 0.6 g/dL (ref 0.4–1.8)
Globulin, Total: 2.5 g/dL (ref 2.2–3.9)
TOTAL PROTEIN ELP: 4.9 g/dL — AB (ref 6.0–8.5)

## 2015-12-03 LAB — SURGICAL PCR SCREEN
MRSA, PCR: NEGATIVE
Staphylococcus aureus: NEGATIVE

## 2015-12-03 LAB — HEMOGLOBIN A1C
HEMOGLOBIN A1C: 5.3 % (ref 4.8–5.6)
MEAN PLASMA GLUCOSE: 105 mg/dL

## 2015-12-03 LAB — CBC
HEMATOCRIT: 28.7 % — AB (ref 39.0–52.0)
HEMOGLOBIN: 9.9 g/dL — AB (ref 13.0–17.0)
MCH: 30.9 pg (ref 26.0–34.0)
MCHC: 34.5 g/dL (ref 30.0–36.0)
MCV: 89.7 fL (ref 78.0–100.0)
Platelets: 171 10*3/uL (ref 150–400)
RBC: 3.2 MIL/uL — ABNORMAL LOW (ref 4.22–5.81)
RDW: 12.8 % (ref 11.5–15.5)
WBC: 7.5 10*3/uL (ref 4.0–10.5)

## 2015-12-03 LAB — HEPATITIS C ANTIBODY: HCV Ab: <0.1 s/co ratio (ref 0.0–0.9)

## 2015-12-03 LAB — HEPATITIS B SURFACE ANTIBODY,QUALITATIVE: Hep B S Ab: NONREACTIVE

## 2015-12-03 MED ORDER — HEPARIN SODIUM (PORCINE) 1000 UNIT/ML DIALYSIS
1000.0000 [IU] | INTRAMUSCULAR | Status: DC | PRN
  Filled 2015-12-03: qty 1

## 2015-12-03 MED ORDER — CHLORHEXIDINE GLUCONATE CLOTH 2 % EX PADS: 6.0000 | MEDICATED_PAD | Freq: Once | CUTANEOUS | Status: AC

## 2015-12-03 MED ORDER — ALTEPLASE 2 MG IJ SOLR
2.0000 mg | Freq: Once | INTRAMUSCULAR | Status: DC | PRN
  Filled 2015-12-03: qty 2

## 2015-12-03 MED ORDER — HEPARIN SODIUM (PORCINE) 1000 UNIT/ML DIALYSIS
20.0000 [IU]/kg | INTRAMUSCULAR | Status: DC | PRN
  Filled 2015-12-03: qty 2

## 2015-12-03 MED ORDER — CHLORHEXIDINE GLUCONATE CLOTH 2 % EX PADS: 6.0000 | MEDICATED_PAD | Freq: Once | CUTANEOUS | Status: DC

## 2015-12-03 MED ORDER — SODIUM CHLORIDE 0.9 % IV SOLN: 100.0000 mL | INTRAVENOUS | Status: DC | PRN

## 2015-12-03 MED ORDER — CEFAZOLIN SODIUM-DEXTROSE 2-4 GM/100ML-% IV SOLN
2.0000 g | INTRAVENOUS | Status: AC
  Administered 2015-12-04: 2 g via INTRAVENOUS
  Filled 2015-12-03: qty 100

## 2015-12-03 NOTE — Care Management Note (Signed)
Case Management Note  Patient Details  Name: Steven Sellers MRN: [id number] Date of Birth: [birth year]  Subjective/Objective:    Patient from home with wife. Ind with ADL's, uses cane at times. Dr. Nancy Nordmann is his PCP, has BCBS, reports no issues obtaining medications.  Receiving dialysis today, possible tunnel catheter tomorrow.   Action/Plan: No CM needs identified. Will follow.   Expected Discharge Date:                  Expected Discharge Plan:  Home/Self Care  In-House Referral:     Discharge planning Services  CM Consult  Post Acute Care Choice:  NA Choice offered to:  NA  DME Arranged:    DME Agency:     HH Arranged:    HH Agency:     Status of Service:  In process, will continue to follow  If discussed at Long Length of Stay Meetings, dates discussed:    Additional Comments:  Steven Sellers, [name], RN 12/03/2015, 2:23 PM

## 2015-12-03 NOTE — Procedures (Signed)
  HEMODIALYSIS TREATMENT NOTE:  4.25 hour low-heparin dialysis completed via right femoral non-tunneled catheter. Improved blood flows today; average Qb 250cc/min with arterial pressures within max limits. Goal met: 3.5 liters removed.  Hypertensive throughout HD session with SBP 160s-180s.  All blood returned.  Plan for tunneled catheter placement tomorrow.  Pt aware he is to be NPO after midnight tonight. Report called to Gershon Crane, RN.    Rockwell Alexandria, RN, CDN

## 2015-12-03 NOTE — Progress Notes (Signed)
PROGRESS NOTE                                                                                                                                                                                                             Patient Demographics:    Steven Sellers, is a 55 y.o. male, DOB - 03-03-1961, [id number]  Admit date - 11/30/2015   Admitting Physician Jani Gravel, MD  Outpatient Primary MD for the patient is [name], MD  LOS - 3  Chief Complaint  Patient presents with  . Abnormal Labs        Brief Narrative    Steven Sellers is a 55 y.o. male, w Hypertension, apparently had labs done by pcp which indicated renal failure and sent to ER for evaluation. Pt notes slight swelling in the legs. Pt denies cp, palp, sob, orthopnea. Pt notes being started on tiazac recently but otherwise no new medication. Pt denies nsaid use. Pt denies any iv dye exposure. Pt denies diarrhea or dehydration but does not working outside.   In ED, Pt found to have ARF, w Bun 63, Creatinine 9.41. Pt also noted to be mildly hyponatremic. Na=126, and calcium 7.0 but albumin not available. pt also noted to be mildly anemic. Hgb =11.6 Pt denies brbpr, black stool. Pt will be admitted for acute on chronic renal failure, hyponatremia, and anemia, and possible hypocalcemia.    Subjective:    Steven Sellers today has, No headache, No chest pain, No abdominal pain - No Nausea, No new weakness tingling or numbness, No Cough - SOB. Improving swelling.   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 Currently he has good appetite, potassium is stable and he is not short of breath.  2. Hyponatremia. Due to fluid overload. Improving with HD.  3. AOCD - stable.  4. HTN - On Diltiazem and Metoprolol.  5. Generalized edema/anasarca due to #1 above. Slightly more significant and left arm. Venous  ultrasound L arm stable.    Family Communication  :  Wife bedside on 12/01/2015  Code Status :  Full  Diet : Renal with fluid restriction  Disposition Plan  :  Stay in-house, dialysis to start  Consults  : Renal  Procedures  :   Renal ultrasound - stable  Venous ultrasound left upper extremity - NO clots  R.Fem Dialysis Catheter by Dr Arnoldo Morale on 12-02-15  HD started 12-02-15   DVT Prophylaxis  :    Heparin   Lab Results  Component Value Date   PLT 171 12/03/2015    Inpatient Medications  Scheduled Meds: . diltiazem  180 mg Oral Daily  . furosemide  200 mg Intravenous BID  . heparin  5,000 Units Subcutaneous Q8H  . metolazone  5 mg Oral BID  . metoprolol tartrate  50 mg Oral BID  . sevelamer carbonate  3,200 mg Oral TID WC  . sodium chloride flush  3 mL Intravenous Q12H  . tuberculin  5 Units Intradermal Once   Continuous Infusions:  PRN Meds:.sodium chloride, sodium chloride, acetaminophen **OR** acetaminophen, heparin, hydrALAZINE, lidocaine (PF), lidocaine-prilocaine, pentafluoroprop-tetrafluoroeth  Antibiotics  :    Anti-infectives    None         Objective:   Vitals:   12/02/15 2015 12/02/15 2030 12/02/15 2132 12/03/15 0540  BP: (!) 143/86 (!) 144/83 (!) 150/78 (!) 150/87  Pulse: (!) 55 (!) 57 60 (!) 56  Resp:   20 20  Temp:   98.7 F (37.1 C) 98 F (36.7 C)  TempSrc:   Oral Oral  SpO2:   94% 96%  Weight:    86.4 kg (190 lb 8 oz)  Height:        Wt Readings from Last 3 Encounters:  12/03/15 86.4 kg (190 lb 8 oz)  11/09/15 74.8 kg (165 lb)  06/07/15 75.3 kg (166 lb)     Intake/Output Summary (Last 24  hours) at 12/03/15 0743 Last data filed at 12/03/15 0600  Gross per 24 hour  Intake            667.5 ml  Output             1498 ml  Net           -830.5 ml     Physical Exam  Awake Alert, Oriented X 3, No new F.N deficits, Normal affect [place].AT,PERRAL Supple Neck,No JVD, No cervical lymphadenopathy appriciated.  Symmetrical Chest wall movement, Good air movement bilaterally, few rales RRR,No Gallops,Rubs or new Murmurs, No Parasternal Heave +ve B.Sounds, Abd Soft, No tenderness, No organomegaly appriciated, No rebound - guarding or rigidity. No Cyanosis, Clubbing, 2+  edema, No new Rash or bruise      Data Review:    CBC  Recent Labs Lab 11/30/15 1220 12/01/15 0500 12/02/15 1015 12/03/15 0351  WBC 10.4 8.0 8.1 7.5  HGB 11.6* 10.2* 11.0* 9.9*  HCT 32.9* 28.9* 31.4* 28.7*  PLT 231 170 195 171  MCV 88.4 89.5 89.7 89.7  MCH 31.2 31.6 31.4 30.9  MCHC 35.3 35.3 35.0 34.5  RDW 13.4 13.5 12.8 12.8  LYMPHSABS 1.1  --   --   --   MONOABS 0.7  --   --   --   EOSABS 0.1  --   --   --   BASOSABS 0.1  --   --   --     Chemistries   Recent Labs Lab 11/30/15 1220 12/01/15 0500 12/02/15 0653 12/02/15 1015  12/03/15 0351  NA 126* 128* 128* 125* 130*  K 4.5 4.0 4.4 4.2 4.0  CL 91* 93* 93* 94* 96*  CO2 20* 21* 19* 18* 20*  GLUCOSE 133* 107* 99 98 138*  BUN 63* 66* 70* 72* 70*  CREATININE 9.41* 9.35* 9.49* 9.65* 9.21*  CALCIUM 7.0* 6.7* 7.0* 6.8* 7.0*  MG  --  2.1  --   --   --   AST 16 12*  --   --   --   ALT 23 19  --  17  --   ALKPHOS 78 61  --   --   --   BILITOT 0.6 0.5  --   --   --    ------------------------------------------------------------------------------------------------------------------ No results for input(s): CHOL, HDL, LDLCALC, TRIG, CHOLHDL, LDLDIRECT in the last 72 hours.  Lab Results  Component Value Date   HGBA1C 5.3 12/01/2015    ------------------------------------------------------------------------------------------------------------------  Recent Labs  12/01/15 0500  TSH 2.537   ------------------------------------------------------------------------------------------------------------------  Recent Labs  12/01/15 0500  VITAMINB12 225  FERRITIN 108  TIBC 209*  IRON 53    Coagulation profile No results for input(s): INR, PROTIME in the last 168 hours.  No results for input(s): DDIMER in the last 72 hours.  Cardiac Enzymes No results for input(s): CKMB, TROPONINI, MYOGLOBIN in the last 168 hours.  Invalid input(s): CK ------------------------------------------------------------------------------------------------------------------ No results found for: BNP  Micro Results No results found for this or any previous visit (from the past 240 hour(s)).  Radiology Reports US Renal  11/30/2015  CLINICAL DATA:  Acute kidney injury EXAM: RENAL / URINARY TRACT ULTRASOUND COMPLETE COMPARISON:  MRI abdomen 04/07/2010 FINDINGS: Right Kidney: Length: 10.7 cm.  No mass or hydronephrosis. Left Kidney: Length: 10.5 cm.  No mass or hydronephrosis. Bladder: Within normal limits. IMPRESSION: Negative renal ultrasound. Electronically Signed   By: Julian Hy M.D.   On: 11/30/2015 15:36     Time Spent in minutes  30   Arnetia Bronk K M.D on 12/03/2015 at 7:43 AM  Between 7am to 7pm - Pager - [phone]  After 7pm go to [email or web address] - password [facility]  [facility] -  Office  [phone]

## 2015-12-03 NOTE — Progress Notes (Signed)
Subjective: Interval History: Patient states that is slightly better but overall sense is significant change. Patient denies any nausea or vomiting.  Objective: Vital signs in last 24 hours: Temp:  [98 F (36.7 C)-98.7 F (37.1 C)] 98 F (36.7 C) (07/24 0540) Pulse Rate:  [55-65] 56 (07/24 0540) Resp:  [16-20] 20 (07/24 0540) BP: (140-164)/(78-90) 150/87 (07/24 0540) SpO2:  [94 %-98 %] 96 % (07/24 0540) Weight:  [86.4 kg (190 lb 8 oz)-87.1 kg (192 lb 0.3 oz)] 86.4 kg (190 lb 8 oz) (07/24 0540) Weight change: 0.418 kg (14.7 oz)  Intake/Output from previous day: 07/23 0701 - 07/24 0700 In: 667.5 [P.O.:660] Out: 1498 [Urine:575] Intake/Output this shift: No intake/output data recorded.  General appearance: alert, cooperative and no distress Resp: diminished breath sounds posterior - bilateral and rales posterior - bilateral Cardio: regular rate and rhythm GI: soft, non-tender; bowel sounds normal; no masses,  no organomegaly Extremities: edema 2-3+ edema  Lab Results:  Recent Labs  12/02/15 1015 12/03/15 0351  WBC 8.1 7.5  HGB 11.0* 9.9*  HCT 31.4* 28.7*  PLT 195 171   BMET:   Recent Labs  12/02/15 1015 12/03/15 0351  NA 125* 130*  K 4.2 4.0  CL 94* 96*  CO2 18* 20*  GLUCOSE 98 138*  BUN 72* 70*  CREATININE 9.65* 9.21*  CALCIUM 6.8* 7.0*   No results for input(s): PTH in the last 72 hours. Iron Studies:   Recent Labs  12/01/15 0500  IRON 53  TIBC 209*  FERRITIN 108    Studies/Results: US Venous Img Upper Uni Left  Result Date: 12/02/2015 CLINICAL DATA:  55 year old male with left upper extremity swelling. EXAM: LEFT UPPER EXTREMITY VENOUS DOPPLER ULTRASOUND TECHNIQUE: Gray-scale sonography with graded compression, as well as color Doppler and duplex ultrasound were performed to evaluate the upper extremity deep venous system from the level of the subclavian vein and including the jugular, axillary, basilic, radial, ulnar and upper cephalic vein.  Spectral Doppler was utilized to evaluate flow at rest and with distal augmentation maneuvers. COMPARISON:  None. FINDINGS: Contralateral Subclavian Vein: Respiratory phasicity is normal and symmetric with the symptomatic side. No evidence of thrombus. Normal compressibility. Internal Jugular Vein: No evidence of thrombus. Normal compressibility, respiratory phasicity and response to augmentation. Subclavian Vein: No evidence of thrombus. Normal compressibility, respiratory phasicity and response to augmentation. Axillary Vein: No evidence of thrombus. Normal compressibility, respiratory phasicity and response to augmentation. Cephalic Vein: No evidence of thrombus. Normal compressibility, respiratory phasicity and response to augmentation. Basilic Vein: No evidence of thrombus. Normal compressibility, respiratory phasicity and response to augmentation. Brachial Veins: No evidence of thrombus. Normal compressibility, respiratory phasicity and response to augmentation. Radial Veins: No evidence of thrombus. Normal compressibility, respiratory phasicity and response to augmentation. Ulnar Veins: No evidence of thrombus. Normal compressibility, respiratory phasicity and response to augmentation. Venous Reflux:  None visualized. Other Findings:  None visualized. IMPRESSION: No evidence of deep venous thrombosis. Electronically Signed   By: Jacqulynn Cadet M.D.   On: 12/02/2015 11:12   I have reviewed the patient's current medications.  Assessment/Plan: Problem #1 renal failure: Possibly acute on chronic. His status post short dialysis yesterday because of Her problem. Patient however denies any nausea or vomiting. Problem #2 leg swelling: Status post short dialysis yesterday. 1 L of ultrafiltration. Still patient with significant sign of fluid overload. Problem #3 hypervolemic hyponatremia: Improving. Problem #4 anemia: His hemoglobin is declining. Presently seems to be within our target goal. Problem #5  chronic renal  failure: stage III  Problem #6 history of pulmonary embolism Problem #7 hyperkalemia: His potassium has corrected Problem #8 history of muscular dystrophy Problem #9 metabolic bone disease: Calcium range but phosphorus is high. Presently started on binder. Plan: 1] we'll make arrangements for patient to get dialysis today 2] patient possibly need to be seen by surgery again to change his catheter. If possible putting a tunneled catheter may be reasonable. After the problem with access is settled will dialyze patient for 4 1/4 and remove if sbp >90 3] We will check cBC and renal pnel in am   LOS: 3 days   Steven Sellers S 12/03/2015,8:20 AM

## 2015-12-03 NOTE — Consult Note (Signed)
SURGICAL CONSULTATION NOTE (initial)  HISTORY OF PRESENT ILLNESS (HPI):  55 y.o. Right-handed male presented upon referral to the ED for abnormal labs (Cr in particular), significant bilateral lower extremity edema x past few weeks, decreased urination, and seems to have developed shortness of breath since admission. Patient reports that 3 weeks ago he fell after working in his yard after drinking a few beers, was evaluated in the ED for Right knee pain (with x-rays at that time, but no labs), and was discharged home with instructions to follow-up with his PMD. His outpatient labs performed by his PMD revealed a Cr of 9.41 vs previous checked by his PMD in 05/2015, at which time it was found to be 1.87. Patient further reports that he required hemodialysis once previously 6 years ago when he received IV contrast for a PE, after which it sounds like he was lost to nephrology follow-up. Patient also reports, while working in his garden/fields recent exposure to Roundup pesticide, to which he attributes his acute kidney injury. Yesterday, a Right femoral vein non-tunneled catheter was inserted, which had some difficulty being used for hemodialysis yesterday, patient is scheduled to undergo dialysis again today through the Right femoral vein non-tunneled catheter, and nephrology requests a tunneled hemodialysis catheter. Patient continues to report B/L lower extremity edema and SOB.   Patient denies any current or recent fever/chills, denies any previous unilateral upper extremity swelling and reports having previously had been dialyzed via a catheter in the vein from his Left arm.  PAST MEDICAL HISTORY (PMH):  Past Medical History:  Diagnosis Date  . Acute renal failure (ARF) (HCC)    Several episodes of HD in 2012  . Alcohol use ([place])   . Chronic kidney disease   . Hypertension   . Muscular dystrophy ([place])   . Pulmonary embolism ([place]) 2011  . Tobacco use   . Vision abnormalities      PAST  SURGICAL HISTORY [facility]):  Past Surgical History:  Procedure Laterality Date  . CARDIAC CATHETERIZATION  2002   normal coronary arteries     MEDICATIONS:  Prior to Admission medications   Medication Sig Start Date End Date Taking? Authorizing Provider  diltiazem (TIAZAC) 180 MG 24 hr capsule Take 180 mg by mouth daily.  11/28/15  Yes Historical Provider, MD  metoprolol (LOPRESSOR) 50 MG tablet Take 50 mg by mouth 2 (two) times daily.   Yes Historical Provider, MD  ibuprofen (ADVIL,MOTRIN) 600 MG tablet Take 1 tablet (600 mg total) by mouth 3 (three) times daily. Take with food Patient not taking: Reported on 11/30/2015 11/10/15   Kem Parkinson, PA-C     ALLERGIES:  Allergies  Allergen Reactions  . Other Other (See Comments)    IV contrast- Renal issues     SOCIAL HISTORY:  Social History   Social History  . Marital status: Married    Spouse name: N/A  . Number of children: N/A  . Years of education: N/A   Occupational History  . Not on file.   Social History Main Topics  . Smoking status: Current Every Day Smoker    Packs/day: 1.00    Types: Cigarettes    Start date: 05/12/1974  . Smokeless tobacco: Not on file  . Alcohol use 14.4 oz/week    24 Standard drinks or equivalent per week     Comment: occ  . Drug use: No  . Sexual activity: Not on file   Other Topics Concern  . Not on file   Social  History Narrative  . No narrative on file    The patient currently resides (home / rehab facility / nursing home): Home  The patient normally is (ambulatory / bedbound): Ambulatory   FAMILY HISTORY:  Family History  Problem Relation Age of Onset  . Hypertension Mother   . Heart attack Father     REVIEW OF SYSTEMS:  Constitutional: denies weight loss, fever, chills, or sweats  Eyes: denies any other vision changes, history of eye injury  ENT: denies sore throat, hearing problems  Respiratory: shortness of breath as per HPI above  Cardiovascular: denies chest pain,  palpitations  Gastrointestinal: denies abdominal pain, N/V, or diarrhea  Musculoskeletal: denies any other joint pains or cramps, B/L lower extremity edema as per HPI above Skin: denies any other rashes or skin discolorations  Neurological: denies any other headache, dizziness, weakness  Psychiatric: denies any other depression, anxiety   All other review of systems were negative   VITAL SIGNS:  Temp:  [98 F (36.7 C)-98.7 F (37.1 C)] 98 F (36.7 C) (07/24 0540) Pulse Rate:  [55-65] 56 (07/24 0540) Resp:  [16-20] 20 (07/24 0540) BP: (140-164)/(78-90) 150/87 (07/24 0540) SpO2:  [94 %-98 %] 96 % (07/24 0540) Weight:  [86.4 kg (190 lb 8 oz)-87.1 kg (192 lb 0.3 oz)] 86.4 kg (190 lb 8 oz) (07/24 0540)     Height: 6\' 2"  (188 cm) Weight: 86.4 kg (190 lb 8 oz) (patient had on his Pj's and a robe) BMI (Calculated): 23.7   INTAKE/OUTPUT:  This shift: Total I/O In: 239 [P.O.:236; I.V.:3] Out: -   Last 2 shifts: @IOLAST2SHIFTS @   PHYSICAL EXAM:  Constitutional:  -- Normal body habitus  -- Awake, alert, and oriented x3  Eyes:  -- Pupils equally round and reactive to light  -- No scleral icterus  Ear, nose, and throat:  -- No jugular venous distension  Pulmonary:  -- No crackles  -- Equal breath sounds bilaterally  Cardiovascular:  -- S1, S2 present  -- No pericardial rubs Abdomen:  -- Soft, nontender, nondistended, no guarding/rebound  -- No abdominal masses appreciated, pulsatile or otherwise  Musculoskeletal / Integumentary:  -- Wounds or skin discoloration: None appreciated -- Extremities: B/L UE and LE FROM, hands and feet warm, B/L 2+ pitting lower extremity edema  Neurologic:  -- Motor function: intact and symmetric -- Sensation: intact and symmetric  Pulse/Doppler Exam: (p=palpable; d=doppler signals; 0=none)     Right   Left   Brach  p   p   Rad  p   p   Fem  p   p   DP  p   p (L > R)   Labs:  CBC:  Lab Results  Component Value Date   WBC 7.5 12/03/2015    RBC 3.20 (L) 12/03/2015   BMP:  Lab Results  Component Value Date   GLUCOSE 138 (H) 12/03/2015   CO2 20 (L) 12/03/2015   BUN 70 (H) 12/03/2015   CREATININE 9.21 (H) 12/03/2015   CALCIUM 7.0 (L) 12/03/2015     Imaging studies:  No new pertinent imaging studies  Assessment/Plan:  55 y.o. male with hypervolemia secondary to AKI superimposed on vs progression of CKD, complicated by pertinent comorbidities including HTN, muscular dystrophy, and tobacco dependency/abuse.   - attempt hemodialysis vs Right femoral vein non-tunneled hemodialysis catheter, will troubleshoot prn  - NPO after midnight and all rsks, benefits, and alternatives for above procedure(s) were discussed, and all of patient's questions were answered to his  expressed understanding  - will plan for placement of internal jugular tunneled hemodialysis catheter tomorrown morning  - smoking cessation strongly advised and discussed  - DVT prophylaxis  All of the above findings and recommendations were discussed with the patient and, and all of his and family's questions were answered to their expressed satisfaction.  Thank you for the opportunity to participate in this patient's care.   -- [name], MD, [place]: [place] [facility] Office: [phone]

## 2015-12-04 ENCOUNTER — Inpatient Hospital Stay (HOSPITAL_COMMUNITY): Payer: BLUE CROSS/BLUE SHIELD

## 2015-12-04 ENCOUNTER — Encounter (HOSPITAL_COMMUNITY)
Admission: EM | Disposition: A | Discharge status: Home / Self Care | Payer: Self-pay | Source: Home / Self Care | Attending: Internal Medicine

## 2015-12-04 ENCOUNTER — Encounter (HOSPITAL_COMMUNITY): Payer: Self-pay | Admitting: *Deleted

## 2015-12-04 ENCOUNTER — Inpatient Hospital Stay (HOSPITAL_COMMUNITY): Payer: BLUE CROSS/BLUE SHIELD | Admitting: Anesthesiology

## 2015-12-04 ENCOUNTER — Inpatient Hospital Stay (HOSPITAL_COMMUNITY)
Admit: 2015-12-04 | Discharge: 2015-12-04 | Disposition: A | Discharge status: Home / Self Care | Payer: BLUE CROSS/BLUE SHIELD | Attending: Surgery | Admitting: Surgery

## 2015-12-04 HISTORY — PX: CENTRAL VENOUS CATHETER INSERTION: SHX401

## 2015-12-04 LAB — UIFE/LIGHT CHAINS/TP QN, 24-HR UR
% BETA, URINE: 17.1 %
ALPHA 1 URINE: 8.2 %
ALPHA 2 UR: 11.1 %
Albumin, U: 54.7 %
FREE KAPPA/LAMBDA RATIO: 2.33 (ref 2.04–10.37)
FREE LAMBDA LT CHAINS, UR: 109 mg/L — AB (ref 0.24–6.66)
Free Lt Chn Excr Rate: 254 mg/L — ABNORMAL HIGH (ref 1.35–24.19)
GAMMA GLOBULIN URINE: 8.8 %
Total Protein, Urine: 857.3 mg/dL

## 2015-12-04 LAB — CBC
HCT: 31.4 % — ABNORMAL LOW (ref 39.0–52.0)
Hemoglobin: 11 g/dL — ABNORMAL LOW (ref 13.0–17.0)
MCH: 31.6 pg (ref 26.0–34.0)
MCHC: 35 g/dL (ref 30.0–36.0)
MCV: 90.2 fL (ref 78.0–100.0)
PLATELETS: 186 10*3/uL (ref 150–400)
RBC: 3.48 MIL/uL — ABNORMAL LOW (ref 4.22–5.81)
RDW: 13.8 % (ref 11.5–15.5)
WBC: 7.9 10*3/uL (ref 4.0–10.5)

## 2015-12-04 LAB — GLUCOSE, CAPILLARY
GLUCOSE-CAPILLARY: 80 mg/dL (ref 65–99)
GLUCOSE-CAPILLARY: 72 mg/dL (ref 65–99)

## 2015-12-04 LAB — RENAL FUNCTION PANEL
ALBUMIN: 2.6 g/dL — AB (ref 3.5–5.0)
Anion gap: 11 (ref 5–15)
BUN: 44 mg/dL — AB (ref 6–20)
CALCIUM: 7.8 mg/dL — AB (ref 8.9–10.3)
CO2: 25 mmol/L (ref 22–32)
CREATININE: 6.7 mg/dL — AB (ref 0.61–1.24)
Chloride: 97 mmol/L — ABNORMAL LOW (ref 101–111)
GFR calc Af Amer: 10 mL/min — ABNORMAL LOW (ref >60)
GFR, EST NON AFRICAN AMERICAN: 8 mL/min — AB (ref >60)
GLUCOSE: 85 mg/dL (ref 65–99)
PHOSPHORUS: 7.5 mg/dL — AB (ref 2.5–4.6)
Potassium: 4.3 mmol/L (ref 3.5–5.1)
SODIUM: 133 mmol/L — AB (ref 135–145)

## 2015-12-04 SURGERY — INSERTION, CATHETER, CENTRAL VENOUS, HICKMAN
Anesthesia: Monitor Anesthesia Care | Laterality: Right

## 2015-12-04 MED ORDER — HYDRALAZINE HCL 25 MG PO TABS
50.0000 mg | ORAL_TABLET | Freq: Three times a day (TID) | ORAL | Status: DC
  Administered 2015-12-04 – 2015-12-06 (×7): 50 mg via ORAL
  Filled 2015-12-04 (×11): qty 2

## 2015-12-04 MED ORDER — SODIUM CHLORIDE 0.9 % IJ SOLN
INTRAMUSCULAR | Status: DC | PRN
  Administered 2015-12-04: 500 mL

## 2015-12-04 MED ORDER — SODIUM CHLORIDE 0.9 % IV SOLN
INTRAVENOUS | Status: DC
  Administered 2015-12-04: 09:00:00 via INTRAVENOUS

## 2015-12-04 MED ORDER — SODIUM CHLORIDE 0.9 % IV SOLN: 100.0000 mL | INTRAVENOUS | Status: DC | PRN

## 2015-12-04 MED ORDER — ALTEPLASE 2 MG IJ SOLR
2.0000 mg | Freq: Once | INTRAMUSCULAR | Status: DC | PRN
  Filled 2015-12-04: qty 2

## 2015-12-04 MED ORDER — HEPARIN SODIUM (PORCINE) 1000 UNIT/ML DIALYSIS
20.0000 [IU]/kg | INTRAMUSCULAR | Status: DC | PRN
  Filled 2015-12-04: qty 2

## 2015-12-04 MED ORDER — HEPARIN 1000 UNIT/ML FOR PERITONEAL DIALYSIS
INTRAMUSCULAR | Status: DC | PRN
  Administered 2015-12-04: 4 mL via INTRAPERITONEAL

## 2015-12-04 MED ORDER — HEPARIN SODIUM (PORCINE) 1000 UNIT/ML IJ SOLN
INTRAMUSCULAR | Status: AC
  Filled 2015-12-04: qty 1

## 2015-12-04 MED ORDER — SUCCINYLCHOLINE CHLORIDE 20 MG/ML IJ SOLN
INTRAMUSCULAR | Status: AC
  Filled 2015-12-04: qty 2

## 2015-12-04 MED ORDER — BUPIVACAINE HCL (PF) 0.5 % IJ SOLN
INTRAMUSCULAR | Status: AC
  Filled 2015-12-04: qty 30

## 2015-12-04 MED ORDER — ONDANSETRON HCL 4 MG/2ML IJ SOLN: 4.0000 mg | Freq: Once | INTRAMUSCULAR | Status: DC | PRN

## 2015-12-04 MED ORDER — MIDAZOLAM HCL 2 MG/2ML IJ SOLN
1.0000 mg | INTRAMUSCULAR | Status: DC | PRN
  Administered 2015-12-04: 2 mg via INTRAVENOUS
  Filled 2015-12-04: qty 2

## 2015-12-04 MED ORDER — PROPOFOL 500 MG/50ML IV EMUL
INTRAVENOUS | Status: DC | PRN
  Administered 2015-12-04: 50 ug/kg/min via INTRAVENOUS

## 2015-12-04 MED ORDER — BUPIVACAINE HCL 0.5 % IJ SOLN
INTRAMUSCULAR | Status: DC | PRN
  Administered 2015-12-04: 10 mL via INTRAMUSCULAR

## 2015-12-04 MED ORDER — FENTANYL CITRATE (PF) 100 MCG/2ML IJ SOLN
25.0000 ug | INTRAMUSCULAR | Status: AC | PRN
  Administered 2015-12-04 (×2): 25 ug via INTRAVENOUS
  Filled 2015-12-04: qty 2

## 2015-12-04 MED ORDER — HEPARIN SODIUM (PORCINE) 1000 UNIT/ML IJ SOLN
INTRAMUSCULAR | Status: AC
  Filled 2015-12-04: qty 3

## 2015-12-04 MED ORDER — FENTANYL CITRATE (PF) 100 MCG/2ML IJ SOLN: 25.0000 ug | INTRAMUSCULAR | Status: DC | PRN

## 2015-12-04 MED ORDER — LIDOCAINE HCL (PF) 1 % IJ SOLN
INTRAMUSCULAR | Status: AC
  Filled 2015-12-04: qty 30

## 2015-12-04 MED ORDER — HEPARIN SODIUM (PORCINE) 1000 UNIT/ML DIALYSIS
1000.0000 [IU] | INTRAMUSCULAR | Status: DC | PRN
  Filled 2015-12-04: qty 1

## 2015-12-04 MED ORDER — MIDAZOLAM HCL 5 MG/5ML IJ SOLN
INTRAMUSCULAR | Status: DC | PRN
  Administered 2015-12-04 (×2): 1 mg via INTRAVENOUS

## 2015-12-04 SURGICAL SUPPLY — 39 items
ADH SKN CLS APL DERMABOND .7 (GAUZE/BANDAGES/DRESSINGS) ×1
BAG DECANTER FOR FLEXI CONT (MISCELLANEOUS) ×3 IMPLANT
BAG HAMPER (MISCELLANEOUS) ×2 IMPLANT
BIOPATCH RED 1 DISK 7.0 (GAUZE/BANDAGES/DRESSINGS) ×2 IMPLANT
CATH PALINDROME RT-P 15FX19CM (CATHETERS) ×1 IMPLANT
CHLORAPREP W/TINT 10.5 ML (MISCELLANEOUS) ×4 IMPLANT
COVER LIGHT HANDLE STERIS (MISCELLANEOUS) ×4 IMPLANT
DECANTER SPIKE VIAL GLASS SM (MISCELLANEOUS) ×4 IMPLANT
DERMABOND ADVANCED (GAUZE/BANDAGES/DRESSINGS) ×1
DERMABOND ADVANCED .7 DNX12 (GAUZE/BANDAGES/DRESSINGS) IMPLANT
DRAPE C-ARM FOLDED MOBILE STRL (DRAPES) ×2 IMPLANT
DRAPE CHEST BREAST 15X10 FENES (DRAPES) ×2 IMPLANT
DRSG SORBAVIEW 3.5X5-5/16 MED (GAUZE/BANDAGES/DRESSINGS) ×2 IMPLANT
GLOVE BIOGEL PI IND STRL 7.0 (GLOVE) IMPLANT
GLOVE BIOGEL PI IND STRL 7.5 (GLOVE) ×1 IMPLANT
GLOVE BIOGEL PI INDICATOR 7.0 (GLOVE) ×2
GLOVE BIOGEL PI INDICATOR 7.5 (GLOVE) ×1
GLOVE ECLIPSE 6.5 STRL STRAW (GLOVE) ×1 IMPLANT
GLOVE ECLIPSE 7.0 STRL STRAW (GLOVE) ×2 IMPLANT
GOWN STRL REUS W/TWL LRG LVL3 (GOWN DISPOSABLE) ×2 IMPLANT
GOWN STRL REUS W/TWL XL LVL3 (GOWN DISPOSABLE) ×2 IMPLANT
IV NS 500ML (IV SOLUTION) ×2
IV NS 500ML BAXH (IV SOLUTION) ×1 IMPLANT
KIT BLADEGUARD II DBL (SET/KITS/TRAYS/PACK) ×2 IMPLANT
KIT ROOM TURNOVER APOR (KITS) ×2 IMPLANT
MARKER SKIN DUAL TIP RULER LAB (MISCELLANEOUS) ×2 IMPLANT
NDL HYPO 25X1 1.5 SAFETY (NEEDLE) ×1 IMPLANT
NEEDLE HYPO 25X1 1.5 SAFETY (NEEDLE) ×2 IMPLANT
PACK BASIC III (CUSTOM PROCEDURE TRAY) ×2
PACK SRG BSC III STRL LF ECLPS (CUSTOM PROCEDURE TRAY) ×1 IMPLANT
PAD ARMBOARD 7.5X6 YLW CONV (MISCELLANEOUS) ×4 IMPLANT
SET BASIN LINEN APH (SET/KITS/TRAYS/PACK) ×2 IMPLANT
SUT SILK 2 0 FSL 18 (SUTURE) ×2 IMPLANT
SUT VIC AB 4-0 PS2 27 (SUTURE) ×2 IMPLANT
SYR 20CC LL (SYRINGE) ×2 IMPLANT
SYR 5ML LL (SYRINGE) ×2 IMPLANT
SYR CONTROL 10ML LL (SYRINGE) ×2 IMPLANT
SYRINGE 10CC LL (SYRINGE) ×2 IMPLANT
TOWEL OR 17X26 4PK STRL BLUE (TOWEL DISPOSABLE) ×2 IMPLANT

## 2015-12-04 NOTE — Anesthesia Preprocedure Evaluation (Signed)
Anesthesia Evaluation  Patient identified by MRN, date of birth, ID band Patient awake    Reviewed: Allergy & Precautions, NPO status , Patient's Chart, lab work & pertinent test results  History of Anesthesia Complications (+) PROLONGED EMERGENCE  Airway Mallampati: II  TM Distance: >3 FB     Dental  (+) Partial Upper   Pulmonary Current Smoker,    breath sounds clear to auscultation       Cardiovascular hypertension, Pt. on home beta blockers and Pt. on medications  Rhythm:Regular Rate:Normal     Neuro/Psych  Neuromuscular disease ( Limb-girdle muscular dystrophy )    GI/Hepatic negative GI ROS,   Endo/Other    Renal/GU ARF and CRFRenal disease     Musculoskeletal   Abdominal   Peds  Hematology  (+) anemia ,   Anesthesia Other Findings   Reproductive/Obstetrics                             Anesthesia Physical Anesthesia Plan  ASA: III  Anesthesia Plan: MAC   Post-op Pain Management:    Induction: Intravenous  Airway Management Planned: Simple Face Mask  Additional Equipment:   Intra-op Plan:   Post-operative Plan:   Informed Consent: I have reviewed the patients History and Physical, chart, labs and discussed the procedure including the risks, benefits and alternatives for the proposed anesthesia with the patient or authorized representative who has indicated his/her understanding and acceptance.     Plan Discussed with:   Anesthesia Plan Comments:         Anesthesia Quick Evaluation

## 2015-12-04 NOTE — Procedures (Signed)
   HEMODIALYSIS TREATMENT NOTE:  4 hour heparin-free dialysis originally scheduled for tomorrow was rescheduled for this evening after pt c/o increased swelling in extremities. HD performed via newly placed tunneled PC which tolerated prescribed flow rates with low venous and arterial pressures.  Goal met: 3.5 liters removed without interruption in ultrafiltration. Pt hemodynamically stable throughout session.  All blood was returned.  Femoral catheter dressing changed; site unremarkable.  Fem cath to be removed tomorrow.  Report given to Caleen Jobs, RN.  Rockwell Alexandria, RN, CDN

## 2015-12-04 NOTE — Anesthesia Procedure Notes (Signed)
Procedure Name: MAC Date/Time: 12/04/2015 9:02 AM Performed by: Andree Elk, Temitayo Covalt A Pre-anesthesia Checklist: Patient identified, Timeout performed, Emergency Drugs available, Suction available and Patient being monitored Oxygen Delivery Method: Simple face mask

## 2015-12-04 NOTE — Progress Notes (Addendum)
PROGRESS NOTE                                                                                                                                                                                                             Patient Demographics:    Steven Sellers, is a 55 y.o. male, DOB - 10/27/1960, [id number]  Admit date - 11/30/2015   Admitting Physician Jani Gravel, MD  Outpatient Primary MD for the patient is [name], MD  LOS - 4  Chief Complaint  Patient presents with  . Abnormal Labs        Brief Narrative    Steven Sellers is a 55 y.o. male, w Hypertension, apparently had labs done by pcp which indicated renal failure and sent to ER for evaluation. Pt notes slight swelling in the legs. Pt denies cp, palp, sob, orthopnea. Pt notes being started on tiazac recently but otherwise no new medication. Pt denies nsaid use. Pt denies any iv dye exposure. Pt denies diarrhea or dehydration but does not working outside.   In ED, Pt found to have ARF, w Bun 63, He's been seen and followed by renal, was started on hemodialysis on 12/02/2015.   Subjective:    Steven Sellers today has, No headache, No chest pain, No abdominal pain - No Nausea, No new weakness tingling or numbness, No Cough - SOB. Improving swelling.   Assessment  & Plan :     1.Renal failure - ARF on CKD 4 - baseline creatinine around 1.8, he does have history of acute renal failure requiring dialysis several years ago after contrast related injury, his renal ultrasound was nonacute, Nephrology is following, despite high dose of IV Lasix urine output was minimal and he was developing progressive edema, per was started on HD by Renal on 12-02-15 via Temporary dialysis catheter, close to 4 L negative so far. Currently he has good appetite, potassium is stable and he is not short of breath.  2. Hyponatremia. Due to fluid overload. Improving with  HD.  3. AOCD - stable.  4. HTN - On Diltiazem and Metoprolol, have added hydralazine for better control.  5. Generalized edema/anasarca due to #1 above. Slightly more significant and left arm. Venous ultrasound L arm stable.    Family Communication  :  Wife bedside on 12/01/2015  Code Status :  Full  Diet : Renal with fluid restriction  Disposition Plan  :  Stay in-house, dialysis to start  Consults  : Renal  Procedures  :   Renal ultrasound - stable  Venous ultrasound left upper extremity - NO clots  R.Fem Dialysis Catheter by Dr Arnoldo Morale on 12-02-15  Due for removal of right femoral dialysis catheter and placement of right IJ dialysis catheter on 12/04/2015  HD started 12-02-15   DVT Prophylaxis  :    Heparin   Lab Results  Component Value Date   PLT 186 12/04/2015    Inpatient Medications  Scheduled Meds: .  ceFAZolin (ANCEF) IV  2 g Intravenous On Call to OR  . Chlorhexidine Gluconate Cloth  6 each Topical Once  . diltiazem  180 mg Oral Daily  . furosemide  200 mg Intravenous BID  . heparin  5,000 Units Subcutaneous Q8H  . metolazone  5 mg Oral BID  . metoprolol tartrate  50 mg Oral BID  . sevelamer carbonate  3,200 mg Oral TID WC  . sodium chloride flush  3 mL Intravenous Q12H  . tuberculin  5 Units Intradermal Once   Continuous Infusions:  PRN Meds:.sodium chloride, sodium chloride, sodium chloride, sodium chloride, acetaminophen **OR** acetaminophen, alteplase, heparin, heparin, hydrALAZINE, lidocaine (PF), lidocaine-prilocaine, pentafluoroprop-tetrafluoroeth  Antibiotics  :    Anti-infectives    Start     Dose/Rate Route Frequency Ordered Stop   12/04/15 0600  ceFAZolin (ANCEF) IVPB 2g/100 mL premix     2 g 200 mL/hr over 30 Minutes Intravenous On call to O.R. 12/03/15 1547 12/05/15 0559         Objective:   Vitals:   12/03/15 1655 12/03/15 1700 12/03/15 1813 12/04/15 0429  BP: (!) 179/96 (!) 180/100 (!) 163/82 (!) 181/95  Pulse: 82 81 75  69  Resp:  16 16 18   Temp:  98.6 F (37 C) 98.2 F (36.8 C) 98.6 F (37 C)  TempSrc:  Oral Oral Oral  SpO2:   96% 100%  Weight:    82.6 kg (182 lb 1.6 oz)  Height:        Wt Readings from Last 3 Encounters:  12/04/15 82.6 kg (182 lb 1.6 oz)  11/09/15 74.8 kg (165 lb)  06/07/15 75.3 kg (166 lb)     Intake/Output Summary (Last 24 hours) at 12/04/15 0801 Last data filed at 12/03/15 1839  Gross per 24 hour  Intake              479 ml  Output             3500 ml  Net            -3021 ml     Physical Exam  Awake Alert, Oriented X 3, No new F.N deficits, Normal affect [place].AT,PERRAL Supple Neck,No JVD, No cervical lymphadenopathy appriciated.  Symmetrical Chest wall movement, Good air movement bilaterally, few rales RRR,No Gallops,Rubs or new Murmurs, No Parasternal Heave +ve B.Sounds, Abd Soft, No tenderness, No organomegaly appriciated, No rebound - guarding or rigidity. No Cyanosis, Clubbing, 2+  edema, No new Rash or bruise      Data Review:    CBC  Recent Labs Lab 11/30/15 1220 12/01/15 0500 12/02/15 1015 12/03/15 0351 12/04/15 0448  WBC 10.4 8.0 8.1 7.5 7.9  HGB 11.6* 10.2* 11.0* 9.9* 11.0*  HCT 32.9* 28.9*  29.3* 31.4* 28.7* 31.4*  PLT 231 170 195 171 186  MCV 88.4 89.5 89.7 89.7 90.2  MCH 31.2 31.6 31.4 30.9  31.6  MCHC 35.3 35.3 35.0 34.5 35.0  RDW 13.4 13.5 12.8 12.8 13.8  LYMPHSABS 1.1  --   --   --   --   MONOABS 0.7  --   --   --   --   EOSABS 0.1  --   --   --   --   BASOSABS 0.1  --   --   --   --     Chemistries   Recent Labs Lab 11/30/15 1220 12/01/15 0500 12/02/15 0653 12/02/15 1015 12/03/15 0351 12/04/15 0448  NA 126* 128* 128* 125* 130* 133*  K 4.5 4.0 4.4 4.2 4.0 4.3  CL 91* 93* 93* 94* 96* 97*  CO2 20* 21* 19* 18* 20* 25  GLUCOSE 133* 107* 99 98 138* 85  BUN 63* 66* 70* 72* 70* 44*  CREATININE 9.41* 9.35* 9.49* 9.65* 9.21* 6.70*  CALCIUM 7.0* 6.7* 7.0* 6.8* 7.0* 7.8*  MG  --  2.1  --   --   --   --   AST 16 12*  --   --    --   --   ALT 23 19  --  17  --   --   ALKPHOS 78 61  --   --   --   --   BILITOT 0.6 0.5  --   --   --   --    ------------------------------------------------------------------------------------------------------------------ No results for input(s): CHOL, HDL, LDLCALC, TRIG, CHOLHDL, LDLDIRECT in the last 72 hours.  Lab Results  Component Value Date   HGBA1C 5.3 12/01/2015   ------------------------------------------------------------------------------------------------------------------ No results for input(s): TSH, T4TOTAL, T3FREE, THYROIDAB in the last 72 hours.  Invalid input(s): FREET3 ------------------------------------------------------------------------------------------------------------------ No results for input(s): VITAMINB12, FOLATE, FERRITIN, TIBC, IRON, RETICCTPCT in the last 72 hours.  Coagulation profile No results for input(s): INR, PROTIME in the last 168 hours.  No results for input(s): DDIMER in the last 72 hours.  Cardiac Enzymes No results for input(s): CKMB, TROPONINI, MYOGLOBIN in the last 168 hours.  Invalid input(s): CK ------------------------------------------------------------------------------------------------------------------ No results found for: BNP  Micro Results Recent Results (from the past 240 hour(s))  Surgical pcr screen     Status: None   Collection Time: 12/03/15  6:10 PM  Result Value Ref Range Status   MRSA, PCR NEGATIVE NEGATIVE Final   Staphylococcus aureus NEGATIVE NEGATIVE Final    Comment:        The Xpert SA Assay (FDA approved for NASAL specimens in patients over 40 years of age), is one component of a comprehensive surveillance program.  Test performance has been validated by [facility] for patients greater than or equal to 49 year old. It is not intended to diagnose infection nor to guide or monitor treatment.     Radiology Reports US Renal  11/30/2015  CLINICAL DATA:  Acute kidney injury EXAM: RENAL /  URINARY TRACT ULTRASOUND COMPLETE COMPARISON:  MRI abdomen 04/07/2010 FINDINGS: Right Kidney: Length: 10.7 cm.  No mass or hydronephrosis. Left Kidney: Length: 10.5 cm.  No mass or hydronephrosis. Bladder: Within normal limits. IMPRESSION: Negative renal ultrasound. Electronically Signed   By: Julian Hy M.D.   On: 11/30/2015 15:36     Time Spent in minutes  30   Ouita Nish K M.D on 12/04/2015 at 8:01 AM  Between 7am to 7pm - Pager - [phone]  After 7pm go to [email or web address] - password [facility]  [facility] -  Office  [phone]

## 2015-12-04 NOTE — Op Note (Signed)
SURGICAL PROCEDURE REPORT  DATE OF PROCEDURE: 12/04/2015   ATTENDING SURGEON: Corene Cornea E. Rosana Hoes, MD   ANESTHESIA: Local with light IV sedation   PRE-OPERATIVE DIAGNOSIS: ESRD requiring functional hemodialysis access   POST-OPERATIVE DIAGNOSIS: ESRD requiring functional hemodialysis access   PROCEDURE(S):  1.) Percutaneous access of Right internal jugular vein under ultrasound guidance  2.) Insertion of Right internal jugular cuffed tunneled Covidien Palendrome hemodialysis catheter  INTRAOPERATIVE FINDINGS: Patent easily compressible Right internal jugular vein with appropriate respiratory variations and well-secured cuffed tunneled hemodialysis catheter at completion of the procedure  INTRAOPERATIVE FLUIDS: 200 mL crystalloid, 0 mL contrast used   FLUOROSCOPY: 7 seconds   ESTIMATED BLOOD LOSS: Minimal (<20 mL)   SPECIMENS: None   IMPLANTS: 19 cm 14.9F cuffed tunneled Covidien Palendrome hemodialysis catheter  DRAINS: None   COMPLICATIONS: None apparent   CONDITION AT COMPLETION: Hemodynamically stable, awake   DISPOSITION: Recovery   INDICATION(S) FOR PROCEDURE:  Patient is a 55 y.o. Right-handed male who presented upon referral to the ED for abnormal labs (Cr in particular), significant bilateral lower extremity edema x few weeks, decreased urination, and shortness of breath. A Right femoral vein non-tunneled hemodialysis catheter was inserted, through which patient was dialyzed, and a tunneled hemodialysis catheter was requested for more durable hemodialysis access. All risks, benefits, and alternatives to above elective procedures were discussed with the patient, who elected to proceed, and informed consent was accordingly obtained at that time.  DETAILS OF PROCEDURE:  Patient was brought to the operative suite and appropriately identified. In Trendelenburg position, Right IJ venous access site was prepped and draped in the usual sterile fashion, and following a brief  timeout, limited duplex evaluation of Right internal jugular vein was performed. Percutaneous Right IJ venous access was obtained under ultrasound guidance using Seldinger technique, by which local anesthetic was injected over the Right IJ vein, and access needle was inserted into the Right IJ vein, through which soft guidewire was advanced, over which access needle was withdrawn. Guidewire was secured, attention was directed to injection of local anesthetic along the planned tunnel site, and hemodialysis catheter was tunneled retrograde from a small incision over the Right chest to the Right IJ access site. Sequential dilators were advanced over guidewire and withdrawn, followed by insertion of sheath for tunneled hemodialysis catheter. Catheter was then advanced through the sheath into Right internal jugular vein and under direct fluoroscopic visualization into the SVC and proximal IVC. Cuffed tunneled hemodialysis catheter was then secured to skin with 2-0 silk suture, and skin at insertion site was re-approximated with buried interrupted 4-0 Vicryl suture. Skin was cleaned, dried, and sterile occlusive dressing was applied. Patient was then safely transferred to PACU.   I was present for all aspects of the procedures, and there were no intraprocedural complications apparent.

## 2015-12-04 NOTE — Anesthesia Postprocedure Evaluation (Signed)
Anesthesia Post Note  Patient: Steven Sellers  Procedure(s) Performed: Procedure(s) (LRB): INSERTION OF TUNNELED HEMODIALYSIS CATHETER RIGHT INTERNAL JUGULAR (Right)  Patient location during evaluation: PACU Anesthesia Type: MAC Level of consciousness: awake and alert and oriented Pain management: pain level controlled Vital Signs Assessment: post-procedure vital signs reviewed and stable Respiratory status: spontaneous breathing Cardiovascular status: stable Postop Assessment: no signs of nausea or vomiting Anesthetic complications: no    Last Vitals:  Vitals:   12/04/15 0429 12/04/15 0845  BP: (!) 181/95 (!) 177/100  Pulse: 69 73  Resp: 18 18  Temp: 37 C 36.7 C    Last Pain:  Vitals:   12/04/15 0845  TempSrc: Oral  PainSc:                  ADAMS, AMY A

## 2015-12-04 NOTE — Progress Notes (Signed)
MD called and informed of increased edema in patient's left hand and legs per patient's request.

## 2015-12-04 NOTE — Transfer of Care (Signed)
Immediate Anesthesia Transfer of Care Note  Patient: Steven Sellers  Procedure(s) Performed: Procedure(s): INSERTION OF TUNNELED HEMODIALYSIS CATHETER RIGHT INTERNAL JUGULAR (Right)  Patient Location: PACU  Anesthesia Type:MAC  Level of Consciousness: awake, oriented and patient cooperative  Airway & Oxygen Therapy: Patient Spontanous Breathing and Patient connected to face mask oxygen  Post-op Assessment: Report given to RN and Post -op Vital signs reviewed and stable  Post vital signs: Reviewed and stable  Last Vitals:  Vitals:   12/04/15 0429 12/04/15 0845  BP: (!) 181/95 (!) 177/100  Pulse: 69 73  Resp: 18 18  Temp: 37 C 36.7 C    Last Pain:  Vitals:   12/04/15 0845  TempSrc: Oral  PainSc:       Patients Stated Pain Goal: 5 ([id number])  Complications: No apparent anesthesia complications

## 2015-12-04 NOTE — Progress Notes (Signed)
Subjective: Interval History: Patient feels much better as well as his leg swelling is concerned. Presently he doesn't have any difficulty breathing. Is feeling much better.   Objective: Vital signs in last 24 hours: Temp:  [98 F (36.7 C)-98.6 F (37 C)] 98.6 F (37 C) (07/25 0429) Pulse Rate:  [69-83] 69 (07/25 0429) Resp:  [16-18] 18 (07/25 0429) BP: (163-191)/(82-110) 181/95 (07/25 0429) SpO2:  [96 %-100 %] 100 % (07/25 0429) Weight:  [82.6 kg (182 lb 1.6 oz)-86.6 kg (190 lb 14.7 oz)] 82.6 kg (182 lb 1.6 oz) (07/25 0429) Weight change: -0.5 kg (-1 lb 1.6 oz)  Intake/Output from previous day: 07/24 0701 - 07/25 0700 In: 479 [P.O.:476; I.V.:3] Out: 3500  Intake/Output this shift: No intake/output data recorded.  General appearance: alert, cooperative and no distress Resp: diminished breath sounds posterior - bilateral and rales posterior - bilateral Cardio: regular rate and rhythm GI: soft, non-tender; bowel sounds normal; no masses,  no organomegaly Extremities: edema 2-3+ edema  Lab Results:  Recent Labs  12/03/15 0351 12/04/15 0448  WBC 7.5 7.9  HGB 9.9* 11.0*  HCT 28.7* 31.4*  PLT 171 186   BMET:   Recent Labs  12/03/15 0351 12/04/15 0448  NA 130* 133*  K 4.0 4.3  CL 96* 97*  CO2 20* 25  GLUCOSE 138* 85  BUN 70* 44*  CREATININE 9.21* 6.70*  CALCIUM 7.0* 7.8*   No results for input(s): PTH in the last 72 hours. Iron Studies:  No results for input(s): IRON, TIBC, TRANSFERRIN, FERRITIN in the last 72 hours.  Studies/Results: US Venous Img Upper Uni Left  Result Date: 12/02/2015 CLINICAL DATA:  55 year old male with left upper extremity swelling. EXAM: LEFT UPPER EXTREMITY VENOUS DOPPLER ULTRASOUND TECHNIQUE: Gray-scale sonography with graded compression, as well as color Doppler and duplex ultrasound were performed to evaluate the upper extremity deep venous system from the level of the subclavian vein and including the jugular, axillary, basilic,  radial, ulnar and upper cephalic vein. Spectral Doppler was utilized to evaluate flow at rest and with distal augmentation maneuvers. COMPARISON:  None. FINDINGS: Contralateral Subclavian Vein: Respiratory phasicity is normal and symmetric with the symptomatic side. No evidence of thrombus. Normal compressibility. Internal Jugular Vein: No evidence of thrombus. Normal compressibility, respiratory phasicity and response to augmentation. Subclavian Vein: No evidence of thrombus. Normal compressibility, respiratory phasicity and response to augmentation. Axillary Vein: No evidence of thrombus. Normal compressibility, respiratory phasicity and response to augmentation. Cephalic Vein: No evidence of thrombus. Normal compressibility, respiratory phasicity and response to augmentation. Basilic Vein: No evidence of thrombus. Normal compressibility, respiratory phasicity and response to augmentation. Brachial Veins: No evidence of thrombus. Normal compressibility, respiratory phasicity and response to augmentation. Radial Veins: No evidence of thrombus. Normal compressibility, respiratory phasicity and response to augmentation. Ulnar Veins: No evidence of thrombus. Normal compressibility, respiratory phasicity and response to augmentation. Venous Reflux:  None visualized. Other Findings:  None visualized. IMPRESSION: No evidence of deep venous thrombosis. Electronically Signed   By: Jacqulynn Cadet M.D.   On: 12/02/2015 11:12   I have reviewed the patient's current medications.  Assessment/Plan: Problem #1 renal failure: Possibly acute on chronic. His status post hemodialysis yesterday. His potassium is normal and patient remained asymptomatic.  Problem #2 leg swelling: Status post short dialysis yesterday with fluid removal. His leg swelling has improved but still patient seems to have significant swelling.  Problem #4 anemia: His hemoglobin is declining. Presently seems to be within our target goal. 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 Presently started on binder. Plan: 1] patient does not require dialysis today 2] rule out partial split catheter placed today and then we'll make arrangements for him to get dialysis tomorrow. Patient could be discharged tomorrow after dialysis if arrangement with outpatient dialysis unit at [facility]  clinic is completed.  3] We will check cBC and renal pnel in am   LOS: 4 days   Sevin Langenbach S 12/04/2015,8:43 AM

## 2015-12-05 DIAGNOSIS — Z789 Other specified health status: Secondary | ICD-10-CM

## 2015-12-05 LAB — RENAL FUNCTION PANEL
ALBUMIN: 2.5 g/dL — AB (ref 3.5–5.0)
ANION GAP: 12 (ref 5–15)
BUN: 22 mg/dL — ABNORMAL HIGH (ref 6–20)
CALCIUM: 7.9 mg/dL — AB (ref 8.9–10.3)
CO2: 26 mmol/L (ref 22–32)
CREATININE: 4.19 mg/dL — AB (ref 0.61–1.24)
Chloride: 98 mmol/L — ABNORMAL LOW (ref 101–111)
GFR, EST AFRICAN AMERICAN: 17 mL/min — AB (ref >60)
GFR, EST NON AFRICAN AMERICAN: 15 mL/min — AB (ref >60)
Glucose, Bld: 112 mg/dL — ABNORMAL HIGH (ref 65–99)
PHOSPHORUS: 5.1 mg/dL — AB (ref 2.5–4.6)
Potassium: 4.1 mmol/L (ref 3.5–5.1)
SODIUM: 136 mmol/L (ref 135–145)

## 2015-12-05 MED ORDER — SEVELAMER CARBONATE 800 MG PO TABS: 3200.0000 mg | ORAL_TABLET | Freq: Three times a day (TID) | ORAL | 1 refills | Status: DC

## 2015-12-05 MED ORDER — HYDRALAZINE HCL 50 MG PO TABS: 50.0000 mg | ORAL_TABLET | Freq: Three times a day (TID) | ORAL | 0 refills | Status: DC

## 2015-12-05 MED ORDER — METOLAZONE 5 MG PO TABS: 5.0000 mg | ORAL_TABLET | Freq: Two times a day (BID) | ORAL | 0 refills | Status: DC

## 2015-12-05 NOTE — Discharge Summary (Addendum)
Physician Discharge Summary  Steven Sellers [id number] DOB: 01-27-1961 DOA: 11/30/2015  PCP: Steven Sellers., MD  Admit date: 11/30/2015 Discharge date: 12/06/2015  Recommendations for Outpatient Follow-up:  1. Pt will need to follow up with PCP in 1-2 weeks post discharge   Discharge Diagnoses:  Active Problems:   RENAL FAILURE, ACUTE   Hyponatremia   Anemia  Discharge Condition: Stable  Diet recommendation: Renal diet   Brief Narrative:  55 y.o. male, w Hypertension, apparently had labs done by pcp which indicated renal failure and sent to ER for evaluation. Pt noted slight swelling in the legs. Pt denies cp, palp, sob, orthopnea. Pt notes being started on tiazac recently but otherwise no new medication. Pt denies nsaid use. Pt denies any iv dye exposure. Pt denies diarrhea or dehydration but does not working outside.   In ED, Pt found to have ARF, w Bun 63, He's been seen and followed by renal, was started on hemodialysis on 12/02/2015.  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 Pt wants to go home but has not been set up with OP HD yet, OK to be discharge when set up.   2. Hyponatremia. Due to fluid overload. Improving with HD.  3. AOCD - stable.  4. HTN, essential - On Diltiazem and Metoprolol, have added hydralazine for better control.  5. Generalized edema/anasarca due to #1 above. Slightly more significant and left arm. Venous ultrasound L arm stable.  Family Communication  :  Wife and multiple other family members at bedsid   Code Status :  Full  Consults  :  Renal   Procedures/Studies: [place] Intraoperative  Result Date: 12/04/2015 CLINICAL DATA:  Ultrasound was provided for use by the ordering physician, and a technical charge was applied by the performing facility.  No radiologist interpretation/professional services rendered.   US Renal  Result Date: 11/30/2015 CLINICAL DATA:  Acute kidney injury EXAM: RENAL / URINARY TRACT ULTRASOUND COMPLETE COMPARISON:  MRI abdomen 04/07/2010 FINDINGS: Right Kidney: Length: 10.7 cm.  No mass or hydronephrosis. Left Kidney: Length: 10.5 cm.  No mass or hydronephrosis. Bladder: Within normal limits. IMPRESSION: Negative renal ultrasound. Electronically Signed   By: Julian Hy M.D.   On: 11/30/2015 15:36   [place] Ue Vein Mapping  Result Date: 12/04/2015 CLINICAL DATA:  55 year old male with end-stage renal disease in need of hemodialysis. Left upper extremity venous mapping study. EXAM: LEFT UPPER EXTREMITY VENOUS DOPPLER ULTRASOUND TECHNIQUE: Gray-scale sonography with color Doppler and duplex ultrasound were performed to evaluate the upper extremity deep venous system. COMPARISON:  Left upper extremity DVT study performed [id number] FINDINGS: Cephalic Vein: Ranges in size from 3.4-5.3 mm. Approximately 5-8 mm deep to the skin surface. Basilic Vein: Ranges in size from 3.1 - 4.3 mm in the upper arm to 1.5 - 3 mm in the forearm. Approximately 4- 7 mm deep to the skin surface. Brachial Veins: Ranges in size from 1.8-3.4 mm. Approximately 9-11 mm deep to the skin surface. Radial artery:  3.2 mm.  Normal triphasic flow. Brachial artery:  4.2-5.4 mm.  Normal triphasic arterial flow. IMPRESSION: Left upper extremity arteriovenous mapping as above.  Electronically Signed   By: Jacqulynn Cadet M.D.   On: 12/04/2015 16:22  US Venous Img Upper Uni Left  Result Date: 12/02/2015 CLINICAL DATA:  55 year old male with left upper extremity swelling. EXAM: LEFT UPPER EXTREMITY VENOUS DOPPLER ULTRASOUND TECHNIQUE: Gray-scale  sonography with graded compression, as well as color Doppler and duplex ultrasound were performed to evaluate the upper extremity deep venous system from the level of the subclavian vein and including the jugular, axillary, basilic, radial, ulnar and upper cephalic vein. Spectral Doppler was utilized to evaluate flow at rest and with distal augmentation maneuvers. COMPARISON:  None. FINDINGS: Contralateral Subclavian Vein: Respiratory phasicity is normal and symmetric with the symptomatic side. No evidence of thrombus. Normal compressibility. Internal Jugular Vein: No evidence of thrombus. Normal compressibility, respiratory phasicity and response to augmentation. Subclavian Vein: No evidence of thrombus. Normal compressibility, respiratory phasicity and response to augmentation. Axillary Vein: No evidence of thrombus. Normal compressibility, respiratory phasicity and response to augmentation. Cephalic Vein: No evidence of thrombus. Normal compressibility, respiratory phasicity and response to augmentation. Basilic Vein: No evidence of thrombus. Normal compressibility, respiratory phasicity and response to augmentation. Brachial Veins: No evidence of thrombus. Normal compressibility, respiratory phasicity and response to augmentation. Radial Veins: No evidence of thrombus. Normal compressibility, respiratory phasicity and response to augmentation. Ulnar Veins: No evidence of thrombus. Normal compressibility, respiratory phasicity and response to augmentation. Venous Reflux:  None visualized. Other Findings:  None visualized. IMPRESSION: No evidence of deep venous thrombosis. Electronically Signed   By: Jacqulynn Cadet M.D.   On: 12/02/2015 11:12  [place] Upper Ext Art Left Ltd  Result Date: 12/04/2015 CLINICAL DATA:  55 year old male with end-stage renal disease in need of hemodialysis. Left upper extremity venous mapping study. EXAM: LEFT UPPER EXTREMITY VENOUS DOPPLER ULTRASOUND TECHNIQUE: Gray-scale sonography  with color Doppler and duplex ultrasound were performed to evaluate the upper extremity deep venous system. COMPARISON:  Left upper extremity DVT study performed [id number] FINDINGS: Cephalic Vein: Ranges in size from 3.4-5.3 mm. Approximately 5-8 mm deep to the skin surface. Basilic Vein: Ranges in size from 3.1 - 4.3 mm in the upper arm to 1.5 - 3 mm in the forearm. Approximately 4- 7 mm deep to the skin surface. Brachial Veins: Ranges in size from 1.8-3.4 mm. Approximately 9-11 mm deep to the skin surface. Radial artery:  3.2 mm.  Normal triphasic flow. Brachial artery:  4.2-5.4 mm.  Normal triphasic arterial flow. IMPRESSION: Left upper extremity arteriovenous mapping as above. Electronically Signed   By: Jacqulynn Cadet M.D.   On: 12/04/2015 16:22  Dg Chest Port 1 View  Result Date: 12/04/2015 CLINICAL DATA:  Hemodialysis catheter placement EXAM: PORTABLE CHEST 1 VIEW COMPARISON:  None. FINDINGS: Right dialysis catheter has been placed with the tip near the confluence of the innominate veins. No pneumothorax. IMPRESSION: Left lower lobe atelectasis or infiltrate. Mild bronchitic changes. Right dialysis catheter tip near the confluence of the innominate veins. Electronically Signed   By: Rolm Baptise M.D.   On: 12/04/2015 11:21  Dg Knee Complete 4 Views Right  Result Date: 11/09/2015 CLINICAL DATA:  Fall.  Knee pain EXAM: RIGHT KNEE - COMPLETE 4+ VIEW COMPARISON:  None. FINDINGS: No evidence of fracture, dislocation, or joint effusion. No evidence of arthropathy or other focal bone abnormality. Soft tissues are unremarkable. IMPRESSION: Negative. Electronically Signed   By: Franchot Gallo M.D.   On: 11/09/2015 22:48   Dg Foot Complete Right  Result Date: 11/10/2015 CLINICAL DATA:  Right foot pain and swelling  at the first through third toes after falling in a hole tonight. EXAM: RIGHT FOOT COMPLETE - 3+ VIEW COMPARISON:  None. FINDINGS: There is no evidence of fracture or dislocation. There is no  evidence of arthropathy or other focal bone abnormality. Soft tissues are unremarkable. IMPRESSION: Negative. Electronically Signed   By: Andreas Newport M.D.   On: 11/10/2015 00:49   Dg C-arm 1-60 Min-no Report  Result Date: 12/04/2015 CLINICAL DATA: insertion of tunneled dialysis catheter C-ARM 1-60 MINUTES Fluoroscopy was utilized by the requesting physician.  No radiographic interpretation.    Discharge Exam: Vitals:   12/05/15 0400 12/05/15 0611  BP: (!) 161/60 (!) 156/96  Pulse: 61 62  Resp: 20 19  Temp: 98.7 F (37.1 C) 98.6 F (37 C)   Vitals:   12/04/15 2210 12/04/15 2238 12/05/15 0400 12/05/15 0611  BP: 137/74 (!) 151/74 (!) 161/60 (!) 156/96  Pulse: (!) 55 (!) 51 61 62  Resp: 16 19 20 19   Temp: 98.3 F (36.8 C) 98.6 F (37 C) 98.7 F (37.1 C) 98.6 F (37 C)  TempSrc: Oral Oral Oral Oral  SpO2:  95% 99% 100%  Weight:    80 kg (176 lb 6.4 oz)  Height:        General: Pt is alert, follows commands appropriately, not in acute distress Cardiovascular: Regular rate and rhythm,  no rubs, no gallops Respiratory: Clear to auscultation bilaterally, no wheezing, no crackles, no rhonchi Abdominal: Soft, non tender, non distended, bowel sounds +, no guarding Extremities: +2 bilateral LE pitting edema, no cyanosis, pulses palpable bilaterally DP and PT Neuro: Grossly nonfocal  Discharge Instructions  Discharge Instructions    Diet - low sodium heart healthy    Complete by:  As directed   Increase activity slowly    Complete by:  As directed       Medication List    STOP taking these medications   ibuprofen 600 MG tablet Commonly known as:  ADVIL,MOTRIN     TAKE these medications   diltiazem 180 MG 24 hr capsule Commonly known as:  TIAZAC Take 180 mg by mouth daily.   hydrALAZINE 50 MG tablet Commonly known as:  APRESOLINE Take 1 tablet (50 mg total) by mouth every 8 (eight) hours.   metolazone 5 MG tablet Commonly known as:  ZAROXOLYN Take 1 tablet (5  mg total) by mouth 2 (two) times daily.   metoprolol 50 MG tablet Commonly known as:  LOPRESSOR Take 50 mg by mouth 2 (two) times daily.   sevelamer carbonate 800 MG tablet Commonly known as:  RENVELA Take 4 tablets (3,200 mg total) by mouth 3 (three) times daily with meals.      Follow-up Information    Vickie Epley, MD. Schedule an appointment as soon as possible for a visit in 1 week(s).   Specialty:  General Surgery Contact information: [facility] [place] [address] [phone]        Steven Sellers., MD .   Specialty:  Internal Medicine Contact information: [address] [place] [id number] [phone]            The results of significant diagnostics from this hospitalization (including imaging, microbiology, ancillary and laboratory) are listed below for reference.     Microbiology: Recent Results (from the past 240 hour(s))  Surgical pcr screen     Status: None   Collection Time: 12/03/15  6:10 PM  Result Value Ref Range Status   MRSA, PCR NEGATIVE NEGATIVE  Final   Staphylococcus aureus NEGATIVE NEGATIVE Final    Comment:        The Xpert SA Assay (FDA approved for NASAL specimens in patients over 73 years of age), is one component of a comprehensive surveillance program.  Test performance has been validated by [facility] for patients greater than or equal to 50 year old. It is not intended to diagnose infection nor to guide or monitor treatment.      Labs: Basic Metabolic Panel:  Recent Labs Lab 12/01/15 0500 12/02/15 0653 12/02/15 1015 12/03/15 0351 12/04/15 0448 12/05/15 0628  NA 128* 128* 125* 130* 133* 136  K 4.0 4.4 4.2 4.0 4.3 4.1  CL 93* 93* 94* 96* 97* 98*  CO2 21* 19* 18* 20* 25 26  GLUCOSE 107* 99 98 138* 85 112*  BUN 66* 70* 72* 70* 44* 22*  CREATININE 9.35* 9.49* 9.65* 9.21* 6.70* 4.19*  CALCIUM 6.7* 7.0* 6.8* 7.0* 7.8* 7.9*  MG 2.1  --   --   --   --   --   PHOS 12.5* 12.2* 12.3*  10.8* 7.5* 5.1*   Liver Function Tests:  Recent Labs Lab 11/30/15 1220 12/01/15 0500 12/02/15 0653 12/02/15 1015 12/03/15 0351 12/04/15 0448 12/05/15 0628  AST 16 12*  --   --   --   --   --   ALT 23 19  --  17  --   --   --   ALKPHOS 78 61  --   --   --   --   --   BILITOT 0.6 0.5  --   --   --   --   --   PROT 5.8* 5.1*  --   --   --   --   --   ALBUMIN 2.6* 2.2* 2.4* 2.6* 2.2* 2.6* 2.5*   CBC:  Recent Labs Lab 11/30/15 1220 12/01/15 0500 12/02/15 1015 12/03/15 0351 12/04/15 0448  WBC 10.4 8.0 8.1 7.5 7.9  NEUTROABS 8.5*  --   --   --   --   HGB 11.6* 10.2* 11.0* 9.9* 11.0*  HCT 32.9* 28.9*  29.3* 31.4* 28.7* 31.4*  MCV 88.4 89.5 89.7 89.7 90.2  PLT 231 170 195 171 186   Cardiac Enzymes:  Recent Labs Lab 11/30/15 1220  CKTOTAL 537*    CBG:  Recent Labs Lab 12/04/15 0733 12/04/15 1038  GLUCAP 80 72   SIGNED: Time coordinating discharge: 30 minutes  MAGICK-MYERS, ISKRA, MD  [facility] 12/05/2015, 3:57 PM Pager [phone]  If 7PM-7AM, please contact night-coverage [email or web address] Password [email or web address]

## 2015-12-05 NOTE — Anesthesia Postprocedure Evaluation (Signed)
Anesthesia Post Note  Patient: Steven Sellers  Procedure(s) Performed: Procedure(s) (LRB): INSERTION OF TUNNELED HEMODIALYSIS CATHETER RIGHT INTERNAL JUGULAR (Right)  Patient location during evaluation: Nursing Unit Anesthesia Type: MAC Level of consciousness: awake and alert, oriented and patient cooperative Pain management: pain level controlled Vital Signs Assessment: post-procedure vital signs reviewed and stable Respiratory status: spontaneous breathing Cardiovascular status: blood pressure returned to baseline and stable Postop Assessment: no signs of nausea or vomiting Anesthetic complications: no    Last Vitals:  Vitals:   12/05/15 0400 12/05/15 0611  BP: (!) 161/60 (!) 156/96  Pulse: 61 62  Resp: 20 19  Temp: 37.1 C 37 C    Last Pain:  Vitals:   12/05/15 0611  TempSrc: Oral  PainSc:                  [name] J

## 2015-12-05 NOTE — Addendum Note (Signed)
Addendum  created 12/05/15 1346 by Charmaine Downs, CRNA   Sign clinical note

## 2015-12-05 NOTE — Progress Notes (Signed)
   HEMODIALYSIS NURSING NOTE:  Pt requesting to discharge home today however outpatient hemodialysis arrangements have not been finalized.  Plan for inpatient HD in am and f/u with case management about outpatient dialysis discharge plans.  Rockwell Alexandria, RN, CDN

## 2015-12-05 NOTE — Progress Notes (Signed)
Steven Sellers  MRN: [id number]  DOB/AGE: 12-13-1960 55 y.o.  Primary Care Physician:FUSCO,LAWRENCE J., MD  Admit date: 11/30/2015  Chief Complaint:  Chief Complaint  Patient presents with  . Abnormal Labs     S-Pt presented on  11/30/2015 with  Chief Complaint  Patient presents with  . Abnormal Labs   .    Pt today feels better  Meds . diltiazem  180 mg Oral Daily  . furosemide  200 mg Intravenous BID  . heparin  5,000 Units Subcutaneous Q8H  . hydrALAZINE  50 mg Oral Q8H  . metolazone  5 mg Oral BID  . metoprolol tartrate  50 mg Oral BID  . sevelamer carbonate  3,200 mg Oral TID WC  . sodium chloride flush  3 mL Intravenous Q12H      Physical Exam: Vital signs in last 24 hours: Temp:  [97.4 F (36.3 C)-99.1 F (37.3 C)] 98.7 F (37.1 C) (07/26 0400) Pulse Rate:  [51-76] 61 (07/26 0400) Resp:  [16-20] 20 (07/26 0400) BP: (137-177)/(60-100) 161/60 (07/26 0400) SpO2:  [95 %-100 %] 99 % (07/26 0400) Weight:  [183 lb 3.2 oz (83.1 kg)] 183 lb 3.2 oz (83.1 kg) (07/25 1800) Weight change: -7 lb 11.5 oz (-3.5 kg) Last BM Date: 12/04/15  Intake/Output from previous day: 07/25 0701 - 07/26 0700 In: 200 [I.V.:200] Out: 3705 [Urine:200; Blood:5] Total I/O In: -  Out: [id number] [Urine:200; Other:3500]   Physical Exam: General- pt is awake,alert, oriented to time place and person Resp- No acute REsp distress, CTA B/L NO Rhonchi CVS- S1S2 regular ij rate and rhythm GIT- BS+, soft, NT, ND EXT- trace LE Edema, NO Cyanosis Access- right permacath   Lab Results: CBC  Recent Labs  12/03/15 0351 12/04/15 0448  WBC 7.5 7.9  HGB 9.9* 11.0*  HCT 28.7* 31.4*  PLT 171 186    BMET  Recent Labs  12/03/15 0351 12/04/15 0448  NA 130* 133*  K 4.0 4.3  CL 96* 97*  CO2 20* 25  GLUCOSE 138* 85  BUN 70* 44*  CREATININE 9.21* 6.70*  CALCIUM 7.0* 7.8*    MICRO Recent Results (from the past 240 hour(s))  Surgical pcr screen     Status: None   Collection Time:  12/03/15  6:10 PM  Result Value Ref Range Status   MRSA, PCR NEGATIVE NEGATIVE Final   Staphylococcus aureus NEGATIVE NEGATIVE Final    Comment:        The Xpert SA Assay (FDA approved for NASAL specimens in patients over 37 years of age), is one component of a comprehensive surveillance program.  Test performance has been validated by [facility] for patients greater than or equal to 62 year old. It is not intended to diagnose infection nor to guide or monitor treatment.       Lab Results  Component Value Date   CALCIUM 7.8 (L) 12/04/2015   PHOS 7.5 (H) 12/04/2015   Albumin 2.6 Corrected calcium 7.8+ 1.2=9.0     Impression: 1)Renal  AKI secondary to ATN                AKI on CKD               CKD stage 3 .               CKD since 2011               CKD secondary to unresolved ATN  Progression of CKD marked with multiple AKI                Required renal replacement therapy since 12/02/15                              AKI vs ESRD -will follow clinically and decide depending upon the course.                     2)HTN  Medication- On Diuretics On Calcium Channel Blockers On Beta blockers On Vasodilators-  3)Anemia HGb is at goal (9--11)   4)CKD Mineral-Bone Disorder Phosphorus not at goal.      On BInders  Calcium is at goal.( after correcting for low albumin)  5)Anasarca Now better  6)Electrolytes Normokalemic NOrmonatremic   7)Acid base Co2 at goal     Plan:  Will continue current care. No need for HD today     Steven Sellers S 12/05/2015, 4:42 AM

## 2015-12-05 NOTE — Discharge Instructions (Signed)
In addition to instructions for kidney disease and dialysis, the following pertain to your tunneled hemodialysis catheter and hemodialysis access:  Diet: Resume home heart healthy Renal diet.   Activity: No heavy lifting (children, pets, laundry) or strenuous activity until follow-up, but light activity and walking are encouraged. Do not drive or drink alcohol if taking narcotic pain medications.  Wound care: Do not allow hemodialysis catheter or dressing to get wet (i.e., sponge baths okay, but no showers with indwelling hemodialysis catheter).   Call surgery office [phone]) at any time if any questions, worsening pain, fevers/chills, bleeding, drainage from catheter site, or other concerns.  Smoking cessation/abstinence again strongly advised.  -- [name], MD, [place]: [facility] Office #: [phone]

## 2015-12-05 NOTE — Progress Notes (Addendum)
[name] Day(s): 5.   Post op day(s): 1 Day Post-Op.   Interval History: Patient seen and examined, no acute events or new complaints overnight. Patient tolerated hemodialysis yesterday evening well through tunneled Right IJ hemodialysis catheter, reports feeling overall better with improved upper extremity swelling and denies pain, SOB, fever/chills, or CP.  Review of Systems:  Constitutional: denies fever, chills  HEENT: denies cough or congestion  Respiratory: denies any shortness of breath  Cardiovascular: denies chest pain or palpitations  Gastrointestinal: denies N/V, diarrhea or constipation  Musculoskeletal: denies pain, decreased motor or sensation  Neurological: denies HA or vision/hearing changes   Vital signs in last 24 hours: [min-max] current  Temp:  [97.4 F (36.3 C)-99.1 F (37.3 C)] 98.6 F (37 C) (07/26 0611) Pulse Rate:  [51-76] 62 (07/26 0611) Resp:  [16-20] 19 (07/26 0611) BP: (137-177)/(60-100) 156/96 (07/26 0611) SpO2:  [95 %-100 %] 100 % (07/26 0611) Weight:  [80 kg (176 lb 6.4 oz)-83.1 kg (183 lb 3.2 oz)] 80 kg (176 lb 6.4 oz) (07/26 0611)     Height: 6\' 2"  (188 cm) Weight: 80 kg (176 lb 6.4 oz) BMI (Calculated): 23.7   Intake/Output this shift:  No intake/output data recorded.   Intake/Output last 2 shifts:  @IOLAST2SHIFTS @   Physical Exam:  Constitutional: alert, cooperative and no distress  HENT: normocephalic without obvious abnormality  Eyes: PERRL, EOM's grossly intact and symmetric  Neuro: CN II - XII grossly intact and symmetric without deficit  Respiratory: breathing non-labored at rest  Cardiovascular: regular rate and sinus rhythm  Gastrointestinal: soft, non-tender, and non-distended  Musculoskeletal: Right neck/chest tunneled hemodialysis catheter NT with minimal ecchymosis, no erythema or drainage, and well-secured; Right groin dressing c/d/i following removal of Right femoral vein non-tunneled hemodialysis  catheter; UE and LE FROM, B/L 2+ pitting lower extremity edema, motor and sensation grossly intact; only mild B/L upper extremity edema  Pulse/Doppler Exam: (p=palpable; d=doppler signals; 0=none)     Right   Left   Brach  p   p   Rad  p   p   Labs:  CBC:  Lab Results  Component Value Date   WBC 7.9 12/04/2015   RBC 3.48 (L) 12/04/2015   BMP:  Lab Results  Component Value Date   GLUCOSE 112 (H) 12/05/2015   CO2 26 12/05/2015   BUN 22 (H) 12/05/2015   CREATININE 4.19 (H) 12/05/2015   CALCIUM 7.9 (L) 12/05/2015     Imaging studies:  Pre-Operative Left Upper Extremity Vein Mapping for Hemodialysis Access (12/04/2015) There is no evidence of acute deep vein thrombosis in the upper extremity. The patent cephalic vein in the forearm measures between 3.4 mm and 3.8 mm with no tributary(ies) noted in the distal forearm, while the patent cephalic vein in the arm measures between 4.1 mm and 5.3 mm in diameter and is 3.3-5.7 mm deep with no tributary(ies) noted in the mid-arm. There is, however, identified an IV in the cephalic vein at the antecubital fossa. The patent basilic vein in the arm measures between 3.1 mm and 4.3 mm in diameter and is 7.9 mm deep with no tributary(ies) in the mid-arm. The axillary, subclavian, internal jugular, and brachiocephalic veins all appear patent. The brachial artery bifurcates 1 cm distal to the antecubital fossa with a diameter of 4.5 mm and triphasic waveforms. The patent Left radial artery measures 3.0 mm.  Assessment/Plan:  55 y.o. male doing well POD # 1 Day Post-Op s/p insertion of tunneled  Right IJ hemodialysis catheter for anasarca associated with AKI superimposed on CKD, unclear whether ESRD, complicated by pertinent comorbidities including HTN, muscular dystrophy, and tobacco dependency/abuse.   - non-tunneled Right femoral vein hemodialysis catheter removed   - patient instructed he must lay flat for 1 hour and watch for bleeding, RN aware   -  after period of bedrest, lower extremity elevation may help reduce lower extremity edema   - follow-up outpatient in 1 week to evaluate catheter and discuss long-term access  - No IV, blood draws, or blood pressure cuffs to Left upper extremity (ordered)  - please call if any issues or questions   All of the above findings and recommendations were discussed with the patient, patient's family, and the medical team, and all of patient's and family's questions were answered to their expressed satisfaction.  -- [name], MD, [place]: [facility] Office: [phone]

## 2015-12-06 DIAGNOSIS — N183 Chronic kidney disease, stage 3 (moderate): Secondary | ICD-10-CM

## 2015-12-06 DIAGNOSIS — E871 Hypo-osmolality and hyponatremia: Secondary | ICD-10-CM

## 2015-12-06 DIAGNOSIS — D631 Anemia in chronic kidney disease: Secondary | ICD-10-CM

## 2015-12-06 DIAGNOSIS — N189 Chronic kidney disease, unspecified: Secondary | ICD-10-CM

## 2015-12-06 DIAGNOSIS — N179 Acute kidney failure, unspecified: Principal | ICD-10-CM

## 2015-12-06 LAB — RENAL FUNCTION PANEL
ANION GAP: 11 (ref 5–15)
Albumin: 2.6 g/dL — ABNORMAL LOW (ref 3.5–5.0)
BUN: 38 mg/dL — ABNORMAL HIGH (ref 6–20)
CHLORIDE: 101 mmol/L (ref 101–111)
CO2: 25 mmol/L (ref 22–32)
Calcium: 8.2 mg/dL — ABNORMAL LOW (ref 8.9–10.3)
Creatinine, Ser: 5.45 mg/dL — ABNORMAL HIGH (ref 0.61–1.24)
GFR, EST AFRICAN AMERICAN: 12 mL/min — AB (ref >60)
GFR, EST NON AFRICAN AMERICAN: 11 mL/min — AB (ref >60)
Glucose, Bld: 111 mg/dL — ABNORMAL HIGH (ref 65–99)
POTASSIUM: 4.5 mmol/L (ref 3.5–5.1)
Phosphorus: 5.7 mg/dL — ABNORMAL HIGH (ref 2.5–4.6)
Sodium: 137 mmol/L (ref 135–145)

## 2015-12-06 MED ORDER — FUROSEMIDE 80 MG PO TABS: 80.0000 mg | ORAL_TABLET | Freq: Every day | ORAL | 0 refills | Status: DC

## 2015-12-06 MED ORDER — FUROSEMIDE 80 MG PO TABS
80.0000 mg | ORAL_TABLET | Freq: Every day | ORAL | Status: DC
  Administered 2015-12-06: 80 mg via ORAL
  Filled 2015-12-06: qty 1

## 2015-12-06 NOTE — Progress Notes (Signed)
Steven Sellers discharged Home per MD order.  Discharge instructions reviewed and discussed with the patient, all questions and concerns answered. Copy of instructions and scripts given to patient.    Medication List    STOP taking these medications   ibuprofen 600 MG tablet Commonly known as:  ADVIL,MOTRIN     TAKE these medications   diltiazem 180 MG 24 hr capsule Commonly known as:  TIAZAC Take 180 mg by mouth daily.   furosemide 80 MG tablet Commonly known as:  LASIX Take 1 tablet (80 mg total) by mouth daily.   hydrALAZINE 50 MG tablet Commonly known as:  APRESOLINE Take 1 tablet (50 mg total) by mouth every 8 (eight) hours.   metoprolol 50 MG tablet Commonly known as:  LOPRESSOR Take 50 mg by mouth 2 (two) times daily.   sevelamer carbonate 800 MG tablet Commonly known as:  RENVELA Take 4 tablets (3,200 mg total) by mouth 3 (three) times daily with meals.       IV site discontinued and catheter remains intact. Site without signs and symptoms of complications. Dressing and pressure applied.  Patient escorted to car by NT in a wheelchair,  no distress noted upon discharge.  Ralene Muskrat Dillyn Joaquin 12/06/2015 6:38 PM

## 2015-12-06 NOTE — Clinical Social Work Note (Signed)
Clinical Social Work Assessment  Patient Details  Name: Steven Sellers MRN: [id number] Date of Birth: May 31, 1960  Date of referral:  12/06/15               Reason for consult:  Other (Comment Required) (outpatient dialysis)                Permission sought to share information with:    Permission granted to share information::     Name::        Agency::     Relationship::     Contact Information:     Housing/Transportation Living arrangements for the past 2 months:  Single Family Home Source of Information:  Patient Patient Interpreter Needed:  None Criminal Activity/Legal Involvement Pertinent to Current Situation/Hospitalization:  No - Comment as needed Significant Relationships:  Spouse Lives with:  Spouse Do you feel safe going back to the place where you live?  Yes Need for family participation in patient care:  No (Coment)  Care giving concerns:  None reported. Pt is independent.    Social Worker assessment / plan:  CSW met with pt at bedside. Notified this morning that pt would need outpatient dialysis arranged. Pt states he lives with his wife who is his best support. Pt was working full time until December of last year when he was taken out of work due to health issues. Pt requests Steven Sellers and would prefer Monday, Wednesday, Friday schedule first shift if available. CSW will initiate referral to [facility] guest services. Pt has transportation.   Employment status:  Other (Comment) (out of work since December) Insurance information:  Managed Care PT Recommendations:  Not assessed at this time Information / Referral to community resources:  Other (Comment Required) ([facility] [place])  Patient/Family's Response to care:  Pt requests Steven Sellers for outpatient dialysis.   Patient/Family's Understanding of and Emotional Response to Diagnosis, Current Treatment, and Prognosis:  Pt appears to be accepting of need for dialysis and states that he had acute dialysis  a few years ago.   Emotional Assessment Appearance:  Appears stated age Attitude/Demeanor/Rapport:  Other (Cooperative) Affect (typically observed):  Accepting, Appropriate Orientation:  Oriented to Self, Oriented to Place, Oriented to  Time, Oriented to Situation Alcohol / Substance use:  Not Applicable Psych involvement (Current and /or in the community):  No (Comment)  Discharge Needs  Concerns to be addressed:  Other (Comment Required (arranging outpatient dialysis) Readmission within the last 30 days:  No Current discharge risk:  None Barriers to Discharge:  Waiting for outpatient dialysis   Steven Sellers, [place] 12/06/2015, 8:47 AM [phone]

## 2015-12-06 NOTE — Progress Notes (Signed)
Subjective: Interval History: The patient presently off with no complaints. He denies any difficulty breathing overall feels much entered.  Objective: Vital signs in last 24 hours: Temp:  [98 F (36.7 C)-98.3 F (36.8 C)] 98 F (36.7 C) (07/27 0543) Pulse Rate:  [63-65] 63 (07/27 0543) Resp:  [20] 20 (07/27 0543) BP: (154-172)/(80-84) 168/84 (07/27 0847) SpO2:  [97 %-99 %] 97 % (07/27 0543) Weight:  [82.5 kg (181 lb 14.4 oz)] 82.5 kg (181 lb 14.4 oz) (07/27 0543) Weight change: -0.591 kg (-1 lb 4.8 oz)  Intake/Output from previous day: 07/26 0701 - 07/27 0700 In: 720 [P.O.:720] Out: -  Intake/Output this shift: No intake/output data recorded.  Generally patient is alert and in no apparent distress Chest is clear to auscultation, no rales rhonchi or egophony. Heart exam revealed regular rate and rhythm no murmur Extremities 1-2+ edema  Lab Results:  Recent Labs  12/04/15 0448  WBC 7.9  HGB 11.0*  HCT 31.4*  PLT 186   BMET:   Recent Labs  12/05/15 0628 12/06/15 0457  NA 136 137  K 4.1 4.5  CL 98* 101  CO2 26 25  GLUCOSE 112* 111*  BUN 22* 38*  CREATININE 4.19* 5.45*  CALCIUM 7.9* 8.2*   No results for input(s): PTH in the last 72 hours. Iron Studies:  No results for input(s): IRON, TIBC, TRANSFERRIN, FERRITIN in the last 72 hours.  Studies/Results: [place] Intraoperative  Result Date: 12/04/2015 CLINICAL DATA:  Ultrasound was provided for use by the ordering physician, and a technical charge was applied by the performing facility.  No radiologist interpretation/professional services rendered.   [place] Ue Vein Mapping  Result Date: 12/04/2015 CLINICAL DATA:  55 year old male with end-stage renal disease in need of hemodialysis. Left upper extremity venous mapping study. EXAM: LEFT UPPER EXTREMITY VENOUS DOPPLER ULTRASOUND TECHNIQUE: Gray-scale sonography with color Doppler and duplex ultrasound were performed to evaluate the upper extremity deep venous system.  COMPARISON:  Left upper extremity DVT study performed [id number] FINDINGS: Cephalic Vein: Ranges in size from 3.4-5.3 mm. Approximately 5-8 mm deep to the skin surface. Basilic Vein: Ranges in size from 3.1 - 4.3 mm in the upper arm to 1.5 - 3 mm in the forearm. Approximately 4- 7 mm deep to the skin surface. Brachial Veins: Ranges in size from 1.8-3.4 mm. Approximately 9-11 mm deep to the skin surface. Radial artery:  3.2 mm.  Normal triphasic flow. Brachial artery:  4.2-5.4 mm.  Normal triphasic arterial flow. IMPRESSION: Left upper extremity arteriovenous mapping as above. Electronically Signed   By: Jacqulynn Cadet M.D.   On: 12/04/2015 16:22  [place] Upper Ext Art Left Ltd  Result Date: 12/04/2015 CLINICAL DATA:  55 year old male with end-stage renal disease in need of hemodialysis. Left upper extremity venous mapping study. EXAM: LEFT UPPER EXTREMITY VENOUS DOPPLER ULTRASOUND TECHNIQUE: Gray-scale sonography with color Doppler and duplex ultrasound were performed to evaluate the upper extremity deep venous system. COMPARISON:  Left upper extremity DVT study performed [id number] FINDINGS: Cephalic Vein: Ranges in size from 3.4-5.3 mm. Approximately 5-8 mm deep to the skin surface. Basilic Vein: Ranges in size from 3.1 - 4.3 mm in the upper arm to 1.5 - 3 mm in the forearm. Approximately 4- 7 mm deep to the skin surface. Brachial Veins: Ranges in size from 1.8-3.4 mm. Approximately 9-11 mm deep to the skin surface. Radial artery:  3.2 mm.  Normal triphasic flow. Brachial artery:  4.2-5.4 mm.  Normal triphasic arterial flow. IMPRESSION: Left upper extremity arteriovenous  mapping as above. Electronically Signed   By: Jacqulynn Cadet M.D.   On: 12/04/2015 16:22  Dg Chest Port 1 View  Result Date: 12/04/2015 CLINICAL DATA:  Hemodialysis catheter placement EXAM: PORTABLE CHEST 1 VIEW COMPARISON:  None. FINDINGS: Right dialysis catheter has been placed with the tip near the confluence of the innominate veins. No  pneumothorax. IMPRESSION: Left lower lobe atelectasis or infiltrate. Mild bronchitic changes. Right dialysis catheter tip near the confluence of the innominate veins. Electronically Signed   By: Rolm Baptise M.D.   On: 12/04/2015 11:21  Dg C-arm 1-60 Min-no Report  Result Date: 12/04/2015 CLINICAL DATA: insertion of tunneled dialysis catheter C-ARM 1-60 MINUTES Fluoroscopy was utilized by the requesting physician.  No radiographic interpretation.    I have reviewed the patient's current medications.  Assessment/Plan: Problem #1 renal failure: Possibly acute on chronic. He is status post hemodialysis on Tuesday. Presently is asymptomatic. Problem #2 fluid overload: Presently he has some swelling but no difficulty breathing. Overall patient seems to have improved. Problem #3 anemia: His hemoglobin is is stable and within our target goal. Problem #4 chronic renal failure: stage III  Problem #5 history of pulmonary embolism Problem #6 hyperkalemia: His potassium has corrected Problem #7 history of muscular dystrophy Problem #8 metabolic bone disease: Patient presently on a binder. His phosphorus is 5.1 has improved significantly. Presently was calcium and phosphorus is within our target goal. Plan: 1] we'll make arrangements for patient to get dialysis today 2] we'll make arrangements for outpatient dialysis so that patient will be discharged home to be followed as an outpatient. 3] We will check cBC and renal pnel in am   LOS: 6 days   Gerturde Kuba S 12/06/2015,9:02 AM

## 2015-12-06 NOTE — Procedures (Signed)
   HEMODIALYSIS TREATMENT NOTE:  4 hour heparin-free dialysis completed via right IJ tunneled PC.  Goal met: 3.6 L removed; no interruption in ultrafiltration. All blood returned.  Report called to Weyman Pedro, RN.  Rockwell Alexandria, RN, CDN

## 2015-12-06 NOTE — Care Management Note (Signed)
Case Management Note  Patient Details  Name: Steven Sellers MRN: [id number] Date of Birth: 1961-02-13  D Additional Comments: Patient to discharge possibly today. OP HD set up by CSW at [facility] in [place]. No CM needs.  Broox Lonigro, [name], RN 12/06/2015, 3:16 PM

## 2015-12-06 NOTE — Clinical Social Work Note (Signed)
Per [employer], insurance is approved. CSW notified MD and pt who is eager to d/c this evening.   Benay Pike, [place]

## 2015-12-06 NOTE — Clinical Social Work Note (Signed)
Per Jeanett Schlein with [facility] guest services, pt has been set up on Tuesday, Thursday, Saturday schedule at 11:30 at [facility]. Pt notified and also aware to go to [facility] prior to 3 tomorrow to complete paperwork. Information on d/c paperwork as well. Awaiting insurance approval.   Benay Pike, [place]

## 2015-12-06 NOTE — Progress Notes (Addendum)
PROGRESS NOTE    Steven Sellers  [id number] DOB: [birth year] DOA: 11/30/2015 PCP: [name], MD Outpatient Specialists: none    Brief Narrative: 55 yom with hx of HTN, PE, muscular dystrophy, AKI was told to come to the ED because of labs being indicative of renal failure. While in the ED, pt was noted to have ARF, BUN 63. He was admitted for further management for his renal failure. Decison was made to initiate dialysis and he was placed with a tunneled right IJ dialysis catheter. Arrangements are being made to schedule outpatient dialysis.    Assessment & Plan:   Active Problems:   RENAL FAILURE, ACUTE   CKD (chronic kidney disease) stage 3, GFR 30-59 ml/min   Hyponatremia   Anemia   Renal failure   Acute renal failure (ARF) (HCC)   Central venous catheter in place     1.  Acute on chronic renal failure stage 4. Cr elevated at 4.19. Nephrology following, appreciate input. Continue HD and lasix per nephrology. Arrangements are being made to set up HD as an outpatient. 2. Hyponatremia likely due to fluid overload. Improved with dialysis. Currently stable at 136 3. Generalized edema/ anasarca due to renal failure. Anticipate improvement with continued dialysis.  4. Anemia of chronic disease. Likely related to renal disease. Appears to be at baseline. Stable 5. Essential HTN. Stable Continue antihypertensives   DVT prophylaxis: Heparin Code Status: Full Family Communication: discussed with patient. No family present at bedside. Disposition Plan: improved, discharge home once outpatient dialysis has been established.   Consultants:   Nephrology  Procedures:  Dialysis 7/23, 7/25  Antimicrobials:   none   Subjective: Appears comfortably. Ambulating about the room. No breathing complaints.  Objective: Vitals:   12/05/15 0611 12/05/15 2151 12/06/15 0543 12/06/15 0847  BP: (!) 156/96 (!) 154/80 (!) 172/82 (!) 168/84  Pulse: 62 65 63   Resp: 19 20 20      Temp: 98.6 F (37 C) 98.3 F (36.8 C) 98 F (36.7 C)   TempSrc: Oral Oral Oral   SpO2: 100% 99% 97%   Weight: 80 kg (176 lb 6.4 oz)  82.5 kg (181 lb 14.4 oz)   Height:        Intake/Output Summary (Last 24 hours) at 12/06/15 0931 Last data filed at 12/05/15 1700  Gross per 24 hour  Intake              480 ml  Output                0 ml  Net              480 ml   Filed Weights   12/04/15 1800 12/05/15 0611 12/06/15 0543  Weight: 83.1 kg (183 lb 3.2 oz) 80 kg (176 lb 6.4 oz) 82.5 kg (181 lb 14.4 oz)    Examination:  General exam: Appears calm and comfortable  Respiratory system: Clear to auscultation. Respiratory effort normal. Cardiovascular system: S1 & S2 heard, RRR. No JVD, murmurs, rubs, gallops or clicks. 1+ bilateral LE edema. Gastrointestinal system: Abdomen is nondistended, soft and nontender. No organomegaly or masses felt. Normal bowel sounds heard. Central nervous system: Alert and oriented. No focal neurological deficits. Extremities: Symmetric 5 x 5 power. Skin: No rashes, lesions or ulcers Psychiatry: Judgement and insight appear normal. Mood & affect appropriate.    Data Reviewed: I have personally reviewed following labs and imaging studies  CBC:  Recent Labs Lab 11/30/15 1220 12/01/15 0500 12/02/15 1015  12/03/15 0351 12/04/15 0448  WBC 10.4 8.0 8.1 7.5 7.9  NEUTROABS 8.5*  --   --   --   --   HGB 11.6* 10.2* 11.0* 9.9* 11.0*  HCT 32.9* 28.9*  29.3* 31.4* 28.7* 31.4*  MCV 88.4 89.5 89.7 89.7 90.2  PLT 231 170 195 171 [id number]   Basic Metabolic Panel:  Recent Labs Lab 12/01/15 0500  12/02/15 1015 12/03/15 0351 12/04/15 0448 12/05/15 0628 12/06/15 0457  NA 128*  < > 125* 130* 133* 136 137  K 4.0  < > 4.2 4.0 4.3 4.1 4.5  CL 93*  < > 94* 96* 97* 98* 101  CO2 21*  < > 18* 20* 25 26 25   GLUCOSE 107*  < > 98 138* 85 112* 111*  BUN 66*  < > 72* 70* 44* 22* 38*  CREATININE 9.35*  < > 9.65* 9.21* 6.70* 4.19* 5.45*  CALCIUM 6.7*  < > 6.8* 7.0*  7.8* 7.9* 8.2*  MG 2.1  --   --   --   --   --   --   PHOS 12.5*  < > 12.3* 10.8* 7.5* 5.1* 5.7*  < > = values in this interval not displayed. GFR: Estimated Creatinine Clearance: 17.8 mL/min (by C-G formula based on SCr of 5.45 mg/dL). Liver Function Tests:  Recent Labs Lab 11/30/15 1220 12/01/15 0500  12/02/15 1015 12/03/15 0351 12/04/15 0448 12/05/15 0628 12/06/15 0457  AST 16 12*  --   --   --   --   --   --   ALT 23 19  --  17  --   --   --   --   ALKPHOS 78 61  --   --   --   --   --   --   BILITOT 0.6 0.5  --   --   --   --   --   --   PROT 5.8* 5.1*  --   --   --   --   --   --   ALBUMIN 2.6* 2.2*  < > 2.6* 2.2* 2.6* 2.5* 2.6*  < > = values in this interval not displayed.  Cardiac Enzymes:  Recent Labs Lab 11/30/15 1220  CKTOTAL 537*   CBG:  Recent Labs Lab 12/04/15 0733 12/04/15 1038  GLUCAP 80 72   Urine analysis:    Component Value Date/Time   COLORURINE YELLOW 11/30/2015 1707   APPEARANCEUR HAZY (A) 11/30/2015 1707   LABSPEC 1.015 11/30/2015 1707   PHURINE 6.0 11/30/2015 1707   GLUCOSEU 250 (A) 11/30/2015 1707   HGBUR LARGE (A) 11/30/2015 1707   BILIRUBINUR NEGATIVE 11/30/2015 1707   KETONESUR NEGATIVE 11/30/2015 1707   PROTEINUR >300 (A) 11/30/2015 1707   UROBILINOGEN 0.2 04/11/2010 2336   NITRITE NEGATIVE 11/30/2015 1707   LEUKOCYTESUR NEGATIVE 11/30/2015 1707   Sepsis Labs: @LABRCNTIP (procalcitonin:4,lacticidven:4)  ) Recent Results (from the past 240 hour(s))  Surgical pcr screen     Status: None   Collection Time: 12/03/15  6:10 PM  Result Value Ref Range Status   MRSA, PCR NEGATIVE NEGATIVE Final   Staphylococcus aureus NEGATIVE NEGATIVE Final    Comment:        The Xpert SA Assay (FDA approved for NASAL specimens in patients over 12 years of age), is one component of a comprehensive surveillance program.  Test performance has been validated by [facility] for patients greater than or equal to 30 year old. It is not  intended  to diagnose infection nor to guide or monitor treatment.     Radiology Studies: [place] Intraoperative  Result Date: 12/04/2015 CLINICAL DATA:  Ultrasound was provided for use by the ordering physician, and a technical charge was applied by the performing facility.  No radiologist interpretation/professional services rendered.   [place] Ue Vein Mapping  Result Date: 12/04/2015 CLINICAL DATA:  55 year old male with end-stage renal disease in need of hemodialysis. Left upper extremity venous mapping study. EXAM: LEFT UPPER EXTREMITY VENOUS DOPPLER ULTRASOUND TECHNIQUE: Gray-scale sonography with color Doppler and duplex ultrasound were performed to evaluate the upper extremity deep venous system. COMPARISON:  Left upper extremity DVT study performed [id number] FINDINGS: Cephalic Vein: Ranges in size from 3.4-5.3 mm. Approximately 5-8 mm deep to the skin surface. Basilic Vein: Ranges in size from 3.1 - 4.3 mm in the upper arm to 1.5 - 3 mm in the forearm. Approximately 4- 7 mm deep to the skin surface. Brachial Veins: Ranges in size from 1.8-3.4 mm. Approximately 9-11 mm deep to the skin surface. Radial artery:  3.2 mm.  Normal triphasic flow. Brachial artery:  4.2-5.4 mm.  Normal triphasic arterial flow. IMPRESSION: Left upper extremity arteriovenous mapping as above. Electronically Signed   By: Jacqulynn Cadet M.D.   On: 12/04/2015 16:22  [place] Upper Ext Art Left Ltd  Result Date: 12/04/2015 CLINICAL DATA:  55 year old male with end-stage renal disease in need of hemodialysis. Left upper extremity venous mapping study. EXAM: LEFT UPPER EXTREMITY VENOUS DOPPLER ULTRASOUND TECHNIQUE: Gray-scale sonography with color Doppler and duplex ultrasound were performed to evaluate the upper extremity deep venous system. COMPARISON:  Left upper extremity DVT study performed [id number] FINDINGS: Cephalic Vein: Ranges in size from 3.4-5.3 mm. Approximately 5-8 mm deep to the skin surface. Basilic Vein: Ranges in size  from 3.1 - 4.3 mm in the upper arm to 1.5 - 3 mm in the forearm. Approximately 4- 7 mm deep to the skin surface. Brachial Veins: Ranges in size from 1.8-3.4 mm. Approximately 9-11 mm deep to the skin surface. Radial artery:  3.2 mm.  Normal triphasic flow. Brachial artery:  4.2-5.4 mm.  Normal triphasic arterial flow. IMPRESSION: Left upper extremity arteriovenous mapping as above. Electronically Signed   By: Jacqulynn Cadet M.D.   On: 12/04/2015 16:22  Dg Chest Port 1 View  Result Date: 12/04/2015 CLINICAL DATA:  Hemodialysis catheter placement EXAM: PORTABLE CHEST 1 VIEW COMPARISON:  None. FINDINGS: Right dialysis catheter has been placed with the tip near the confluence of the innominate veins. No pneumothorax. IMPRESSION: Left lower lobe atelectasis or infiltrate. Mild bronchitic changes. Right dialysis catheter tip near the confluence of the innominate veins. Electronically Signed   By: Rolm Baptise M.D.   On: 12/04/2015 11:21  Dg C-arm 1-60 Min-no Report  Result Date: 12/04/2015 CLINICAL DATA: insertion of tunneled dialysis catheter C-ARM 1-60 MINUTES Fluoroscopy was utilized by the requesting physician.  No radiographic interpretation.   Scheduled Meds: . diltiazem  180 mg Oral Daily  . furosemide  80 mg Oral Daily  . heparin  5,000 Units Subcutaneous Q8H  . hydrALAZINE  50 mg Oral Q8H  . metoprolol tartrate  50 mg Oral BID  . sevelamer carbonate  3,200 mg Oral TID WC  . sodium chloride flush  3 mL Intravenous Q12H   Continuous Infusions:    LOS: 6 days    Time spent: 25 minutes   Kathie Dike, MD [facility]  If 7PM-7AM, please contact night-coverage [email or web address] Password [facility] 12/06/2015, 9:31 AM  By signing my name below, I, Delene Ruffini, attest that this documentation has been prepared under the direction and in the presence of Kathie Dike, MD. Electronically Signed: Delene Ruffini 12/06/15 1:35pm  I, Dr. Kathie Dike, personally performed the  services described in this documentaiton. All medical record entries made by the scribe were at my direction and in my presence. I have reviewed the chart and agree that the record reflects my personal performance and is accurate and complete  Kathie Dike, MD, 12/06/2015 1:46 PM   Addendum:  Informed by social worker that dialysis has been set up for 7/29. Patient will receive dialysis TThS. He is otherwise stable for discharge. Follow up with nephrology. Patient is stable for discharge. Refer to discharge summary from 7/26 for details.  Discharge meds as follows:     Medication List    STOP taking these medications   ibuprofen 600 MG tablet Commonly known as:  ADVIL,MOTRIN     TAKE these medications   diltiazem 180 MG 24 hr capsule Commonly known as:  TIAZAC Take 180 mg by mouth daily.   furosemide 80 MG tablet Commonly known as:  LASIX Take 1 tablet (80 mg total) by mouth daily.   hydrALAZINE 50 MG tablet Commonly known as:  APRESOLINE Take 1 tablet (50 mg total) by mouth every 8 (eight) hours.   metoprolol 50 MG tablet Commonly known as:  LOPRESSOR Take 50 mg by mouth 2 (two) times daily.   sevelamer carbonate 800 MG tablet Commonly known as:  RENVELA Take 4 tablets (3,200 mg total) by mouth 3 (three) times daily with meals.

## 2015-12-10 ENCOUNTER — Encounter (HOSPITAL_COMMUNITY): Payer: Self-pay | Admitting: Surgery

## 2015-12-15 ENCOUNTER — Emergency Department (HOSPITAL_COMMUNITY): Payer: BLUE CROSS/BLUE SHIELD

## 2015-12-15 ENCOUNTER — Inpatient Hospital Stay (HOSPITAL_COMMUNITY)
Admission: EM | Admit: 2015-12-15 | Discharge: 2015-12-17 | DRG: 640 | Disposition: A | Discharge status: Home / Self Care | Payer: BLUE CROSS/BLUE SHIELD | Attending: Internal Medicine | Admitting: Internal Medicine

## 2015-12-15 ENCOUNTER — Encounter (HOSPITAL_COMMUNITY): Payer: Self-pay | Admitting: Emergency Medicine

## 2015-12-15 DIAGNOSIS — N186 End stage renal disease: Secondary | ICD-10-CM | POA: Diagnosis present

## 2015-12-15 DIAGNOSIS — I10 Essential (primary) hypertension: Secondary | ICD-10-CM | POA: Diagnosis present

## 2015-12-15 DIAGNOSIS — Z86711 Personal history of pulmonary embolism: Secondary | ICD-10-CM

## 2015-12-15 DIAGNOSIS — I509 Heart failure, unspecified: Secondary | ICD-10-CM

## 2015-12-15 DIAGNOSIS — E8770 Fluid overload, unspecified: Secondary | ICD-10-CM | POA: Diagnosis not present

## 2015-12-15 DIAGNOSIS — Z992 Dependence on renal dialysis: Secondary | ICD-10-CM

## 2015-12-15 DIAGNOSIS — F17210 Nicotine dependence, cigarettes, uncomplicated: Secondary | ICD-10-CM | POA: Diagnosis present

## 2015-12-15 DIAGNOSIS — D649 Anemia, unspecified: Secondary | ICD-10-CM | POA: Diagnosis present

## 2015-12-15 DIAGNOSIS — I132 Hypertensive heart and chronic kidney disease with heart failure and with stage 5 chronic kidney disease, or end stage renal disease: Secondary | ICD-10-CM | POA: Diagnosis present

## 2015-12-15 DIAGNOSIS — R0602 Shortness of breath: Secondary | ICD-10-CM | POA: Diagnosis not present

## 2015-12-15 DIAGNOSIS — Z8249 Family history of ischemic heart disease and other diseases of the circulatory system: Secondary | ICD-10-CM

## 2015-12-15 DIAGNOSIS — G710 Muscular dystrophy: Secondary | ICD-10-CM | POA: Diagnosis present

## 2015-12-15 LAB — CBC WITH DIFFERENTIAL/PLATELET
BASOS ABS: 0.1 10*3/uL (ref 0.0–0.1)
BASOS PCT: 1 %
EOS ABS: 0.2 10*3/uL (ref 0.0–0.7)
EOS PCT: 3 %
HEMATOCRIT: 24.6 % — AB (ref 39.0–52.0)
Hemoglobin: 8.1 g/dL — ABNORMAL LOW (ref 13.0–17.0)
Lymphocytes Relative: 18 %
Lymphs Abs: 1.2 10*3/uL (ref 0.7–4.0)
MCH: 31.2 pg (ref 26.0–34.0)
MCHC: 32.9 g/dL (ref 30.0–36.0)
MCV: 94.6 fL (ref 78.0–100.0)
MONO ABS: 0.7 10*3/uL (ref 0.1–1.0)
MONOS PCT: 10 %
Neutro Abs: 4.5 10*3/uL (ref 1.7–7.7)
Neutrophils Relative %: 68 %
PLATELETS: 276 10*3/uL (ref 150–400)
RBC: 2.6 MIL/uL — ABNORMAL LOW (ref 4.22–5.81)
RDW: 13.6 % (ref 11.5–15.5)
WBC: 6.6 10*3/uL (ref 4.0–10.5)

## 2015-12-15 NOTE — ED Provider Notes (Signed)
[place] DEPT Provider Note   CSN: [id number] Arrival date & time: 12/15/15  2154  First Provider Contact:  None       History   Chief Complaint Chief Complaint  Patient presents with  . Shortness of Breath    HPI JONMICHAEL BELTRAME is a 55 y.o. male.  To the emergency department for evaluation of shortness of breath. Patient reports that symptoms began earlier this evening. Patient recently started dialysis after acute renal failure. He has had dialysis 3 times a week for the last 3 weeks. Patient had a full session of dialysis today. He felt well after the dialysis session, started to have shortness of breath worsening with lying flat this evening. Patient does report associated cough, feels like he is getting "a cold". He is not expressing any chest pain. He has not had any fever.    Shortness of Breath     Past Medical History:  Diagnosis Date  . Acute renal failure (ARF) (HCC)    Several episodes of HD in 2012  . Alcohol use ([place])   . Chronic kidney disease   . Hypertension   . Muscular dystrophy ([place])   . Pulmonary embolism ([place]) 2011  . Tobacco use   . Vision abnormalities     Patient Active Problem List   Diagnosis Date Noted  . Central venous catheter in place   . Hyponatremia 11/30/2015  . Anemia 11/30/2015  . Renal failure 11/30/2015  . Acute renal failure (ARF) ([place]) 11/30/2015  . Hyperkalemia 05/12/2015  . CKD (chronic kidney disease) stage 3, GFR 30-59 ml/min 05/12/2015  . 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 Muscle atrophy 04/25/2015  . Arm weakness 04/25/2015  . Leg weakness 04/25/2015  . Dysesthesia 04/25/2015  . HYPERTENSION 05/21/2010  . PULMONARY EMBOLISM 05/21/2010  . PLEURAL EFFUSION 05/21/2010  . RENAL FAILURE, ACUTE 05/21/2010    Past Surgical History:  Procedure Laterality Date  . CARDIAC CATHETERIZATION  2002   normal coronary arteries  . CENTRAL VENOUS CATHETER INSERTION Right 12/04/2015   Procedure: INSERTION  OF TUNNELED HEMODIALYSIS CATHETER RIGHT INTERNAL JUGULAR;  Surgeon: Vickie Epley, MD;  Location: [facility];  Service: General;  Laterality: Right;       Home Medications    Prior to Admission medications   Medication Sig Start Date End Date Taking? Authorizing Provider  acetaminophen (TYLENOL 8 HOUR) 650 MG CR tablet Take 650 mg by mouth every 8 (eight) hours as needed for pain.   Yes Historical Provider, MD  diltiazem (TIAZAC) 180 MG 24 hr capsule Take 180 mg by mouth daily.  11/28/15  Yes Historical Provider, MD  furosemide (LASIX) 40 MG tablet Take 40 mg by mouth 2 (two) times daily. 12/11/15  Yes Historical Provider, MD  hydrALAZINE (APRESOLINE) 50 MG tablet Take 1 tablet (50 mg total) by mouth every 8 (eight) hours. 12/05/15  Yes Theodis Blaze, MD  metoprolol (LOPRESSOR) 50 MG tablet Take 50 mg by mouth 2 (two) times daily.   Yes Historical Provider, MD  sevelamer carbonate (RENVELA) 800 MG tablet Take 4 tablets (3,200 mg total) by mouth 3 (three) times daily with meals. 12/05/15  Yes Theodis Blaze, MD  metolazone (ZAROXOLYN) 5 MG tablet Take 5 mg by mouth 2 (two) times daily. 12/07/15   Historical Provider, MD    Family History Family History  Problem Relation Age of Onset  . Hypertension Mother   . Heart attack Father     Social History Social History  Substance Use Topics  . Smoking status: Current Every Day Smoker    Packs/day: 1.00    Types: Cigarettes    Start date: 05/12/1974  . Smokeless tobacco: Never Used  . Alcohol use 14.4 oz/week    24 Standard drinks or equivalent per week     Comment: occ     Allergies   Other   Review of Systems Review of Systems  Respiratory: Positive for shortness of breath.   All other systems reviewed and are negative.    Physical Exam Updated Vital Signs BP 169/100   Pulse 77   Temp 97.9 F (36.6 C) (Oral)   Resp 21   Ht 6\' 2"  (1.88 m)   Wt 180 lb (81.6 kg)   SpO2 93%   BMI 23.11 kg/m   Physical Exam  Constitutional:  He is oriented to person, place, and time. He appears well-developed and well-nourished. No distress.  HENT:  Head: Normocephalic and atraumatic.  Right Ear: Hearing normal.  Left Ear: Hearing normal.  Nose: Nose normal.  Mouth/Throat: Oropharynx is clear and moist and mucous membranes are normal.  Eyes: Conjunctivae and EOM are normal. Pupils are equal, round, and reactive to light.  Neck: Normal range of motion. Neck supple.  Cardiovascular: Regular rhythm, S1 normal and S2 normal.  Exam reveals no gallop and no friction rub.   No murmur heard. Pulmonary/Chest: Effort normal and breath sounds normal. No respiratory distress. He exhibits no tenderness.  Abdominal: Soft. Normal appearance and bowel sounds are normal. There is no hepatosplenomegaly. There is no tenderness. There is no rebound, no guarding, no tenderness at McBurney's point and negative Murphy's sign. No hernia.  Musculoskeletal: Normal range of motion.  Neurological: He is alert and oriented to person, place, and time. He has normal strength. No cranial nerve deficit or sensory deficit. Coordination normal. GCS eye subscore is 4. GCS verbal subscore is 5. GCS motor subscore is 6.  Skin: Skin is warm, dry and intact. No rash noted. No cyanosis.  Psychiatric: He has a normal mood and affect. His speech is normal and behavior is normal. Thought content normal.  Nursing note and vitals reviewed.    ED Treatments / Results  Labs (all labs ordered are listed, but only abnormal results are displayed) Labs Reviewed  CBC WITH DIFFERENTIAL/PLATELET - Abnormal; Notable for the following:       Result Value   RBC 2.60 (*)    Hemoglobin 8.1 (*)    HCT 24.6 (*)    All other components within normal limits  COMPREHENSIVE METABOLIC PANEL - Abnormal; Notable for the following:    Sodium 133 (*)    Glucose, Bld 104 (*)    Creatinine, Ser 2.47 (*)    Calcium 7.4 (*)    Total Protein 5.7 (*)    Albumin 2.5 (*)    ALT 11 (*)    GFR  calc non Af Amer 28 (*)    GFR calc Af Amer 32 (*)    All other components within normal limits  TROPONIN I - Abnormal; Notable for the following:    Troponin I 0.04 (*)    All other components within normal limits  BRAIN NATRIURETIC PEPTIDE - Abnormal; Notable for the following:    B Natriuretic Peptide 2,072.0 (*)    All other components within normal limits    EKG  EKG Interpretation  Date/Time:  Saturday December 15 2015 22:02:54 EDT Ventricular Rate:  83 PR Interval:    QRS Duration:  112 QT Interval:  434 QTC Calculation: 510 R Axis:   74 Text Interpretation:  Sinus rhythm Probable left atrial enlargement Incomplete right bundle branch block Nonspecific T abnormalities, lateral leads Prolonged QT interval No significant change since last tracing Confirmed by Betsey Holiday  MD, Emanuell Morina [phone]) on 12/15/2015 11:06:31 PM       Radiology Dg Chest 2 View  Result Date: 12/16/2015 CLINICAL DATA:  55 year old male with shortness of breath. EXAM: CHEST  2 VIEW COMPARISON:  Chest radiograph dated 12/04/2015 FINDINGS: There is diffuse interstitial prominence with Kerley B-lines compatible with interstitial edema. Pneumonia is not excluded. There is no focal consolidation, or pneumothorax. Small bilateral pleural effusions. The Cardiac silhouette is within normal limits. Right IJ dialysis catheter with tip over central SVC. No acute osseous pathology. IMPRESSION: Diffuse interstitial edema and small bilateral pleural effusions. No focal consolidation. Electronically Signed   By: Anner Crete M.D.   On: 12/16/2015 00:16    Procedures Procedures (including critical care time)  Medications Ordered in ED Medications  furosemide (LASIX) injection 40 mg (not administered)  hydrALAZINE (APRESOLINE) injection 20 mg (not administered)     Initial Impression / Assessment and Plan / ED Course  I have reviewed the triage vital signs and the nursing notes.  Pertinent labs & imaging results that  were available during my care of the patient were reviewed by me and considered in my medical decision making (see chart for details).  Clinical Course  Patient presents to the emergency part for evaluation of shortness of breath. Symptoms began this evening. Patient was hospitalized last month for acute renal failure and required initiation of dialysis. Patient has been receiving outpatient dialysis on Tuesday, Thursday, Saturday. He did have a full session of dialysis earlier today. Symptoms began this evening. Patient having significant orthopnea and dyspnea on exertion. EKG does not suggest any significant ischemia or infarct. Troponin is 0.04, likely secondary to volume overload and slight leak. Will need to be cycled. Patient's BNP is markedly elevated. Chest x-ray does show evidence of volume overload and edema.  Lab work does reveal progressive improvement of his renal function. Presentation was discussed with Dr. Lowanda Foster, his nephrologist. He agrees with admission and IV Lasix, will see patient tomorrow and determine need for dialysis.  Patient hypertensive here in the ER as well. He was given IV hydralazine, does take oral hydralazine at home.  CRITICAL CARE Performed by: Orpah Greek   Total critical care time: 30 minutes  Critical care time was exclusive of separately billable procedures and treating other patients.  Critical care was necessary to treat or prevent imminent or life-threatening deterioration.  Critical care was time spent personally by me on the following activities: development of treatment plan with patient and/or surrogate as well as nursing, discussions with consultants, evaluation of patient's response to treatment, examination of patient, obtaining history from patient or surrogate, ordering and performing treatments and interventions, ordering and review of laboratory studies, ordering and review of radiographic studies, pulse oximetry and  re-evaluation of patient's condition.   Final Clinical Impressions(s) / ED Diagnoses   Final diagnoses:  None  CHF Uncontrolled HTN  New Prescriptions New Prescriptions   No medications on file     Orpah Greek, MD 12/16/15 0111

## 2015-12-16 ENCOUNTER — Encounter (HOSPITAL_COMMUNITY): Payer: Self-pay | Admitting: *Deleted

## 2015-12-16 DIAGNOSIS — G710 Muscular dystrophy: Secondary | ICD-10-CM | POA: Diagnosis present

## 2015-12-16 DIAGNOSIS — F17210 Nicotine dependence, cigarettes, uncomplicated: Secondary | ICD-10-CM | POA: Diagnosis present

## 2015-12-16 DIAGNOSIS — N186 End stage renal disease: Secondary | ICD-10-CM | POA: Diagnosis present

## 2015-12-16 DIAGNOSIS — R0602 Shortness of breath: Secondary | ICD-10-CM | POA: Diagnosis present

## 2015-12-16 DIAGNOSIS — E8770 Fluid overload, unspecified: Secondary | ICD-10-CM | POA: Diagnosis present

## 2015-12-16 DIAGNOSIS — R0600 Dyspnea, unspecified: Secondary | ICD-10-CM | POA: Diagnosis not present

## 2015-12-16 DIAGNOSIS — I509 Heart failure, unspecified: Secondary | ICD-10-CM

## 2015-12-16 DIAGNOSIS — I132 Hypertensive heart and chronic kidney disease with heart failure and with stage 5 chronic kidney disease, or end stage renal disease: Secondary | ICD-10-CM | POA: Diagnosis present

## 2015-12-16 DIAGNOSIS — D649 Anemia, unspecified: Secondary | ICD-10-CM | POA: Diagnosis present

## 2015-12-16 DIAGNOSIS — I10 Essential (primary) hypertension: Secondary | ICD-10-CM | POA: Diagnosis not present

## 2015-12-16 DIAGNOSIS — Z8249 Family history of ischemic heart disease and other diseases of the circulatory system: Secondary | ICD-10-CM | POA: Diagnosis not present

## 2015-12-16 DIAGNOSIS — Z992 Dependence on renal dialysis: Secondary | ICD-10-CM | POA: Diagnosis not present

## 2015-12-16 DIAGNOSIS — Z86711 Personal history of pulmonary embolism: Secondary | ICD-10-CM | POA: Diagnosis not present

## 2015-12-16 LAB — COMPREHENSIVE METABOLIC PANEL
ALT: 11 U/L — ABNORMAL LOW (ref 17–63)
AST: 19 U/L (ref 15–41)
Albumin: 2.5 g/dL — ABNORMAL LOW (ref 3.5–5.0)
Alkaline Phosphatase: 67 U/L (ref 38–126)
Anion gap: 5 (ref 5–15)
BILIRUBIN TOTAL: 0.4 mg/dL (ref 0.3–1.2)
BUN: 10 mg/dL (ref 6–20)
CALCIUM: 7.4 mg/dL — AB (ref 8.9–10.3)
CHLORIDE: 102 mmol/L (ref 101–111)
CO2: 26 mmol/L (ref 22–32)
CREATININE: 2.47 mg/dL — AB (ref 0.61–1.24)
GFR calc Af Amer: 32 mL/min — ABNORMAL LOW (ref >60)
GFR calc non Af Amer: 28 mL/min — ABNORMAL LOW (ref >60)
GLUCOSE: 104 mg/dL — AB (ref 65–99)
Potassium: 3.5 mmol/L (ref 3.5–5.1)
Sodium: 133 mmol/L — ABNORMAL LOW (ref 135–145)
Total Protein: 5.7 g/dL — ABNORMAL LOW (ref 6.5–8.1)

## 2015-12-16 LAB — TROPONIN I
TROPONIN I: 0.03 ng/mL — AB (ref <0.03)
TROPONIN I: 0.04 ng/mL — AB (ref <0.03)
Troponin I: 0.04 ng/mL (ref <0.03)
Troponin I: 0.04 ng/mL (ref <0.03)

## 2015-12-16 LAB — TSH: TSH: 0.758 u[IU]/mL (ref 0.350–4.500)

## 2015-12-16 LAB — BRAIN NATRIURETIC PEPTIDE: B Natriuretic Peptide: 2072 pg/mL — ABNORMAL HIGH (ref 0.0–100.0)

## 2015-12-16 LAB — MRSA PCR SCREENING: MRSA by PCR: NEGATIVE

## 2015-12-16 MED ORDER — ONDANSETRON HCL 4 MG/2ML IJ SOLN: 4.0000 mg | Freq: Four times a day (QID) | INTRAMUSCULAR | Status: DC | PRN

## 2015-12-16 MED ORDER — HEPARIN SODIUM (PORCINE) 1000 UNIT/ML IJ SOLN
INTRAMUSCULAR | Status: AC
  Filled 2015-12-16: qty 5

## 2015-12-16 MED ORDER — FUROSEMIDE 10 MG/ML IJ SOLN
40.0000 mg | Freq: Two times a day (BID) | INTRAMUSCULAR | Status: DC
  Administered 2015-12-16 – 2015-12-17 (×3): 40 mg via INTRAVENOUS
  Filled 2015-12-16 (×3): qty 4

## 2015-12-16 MED ORDER — SODIUM CHLORIDE 0.9 % IV SOLN: 100.0000 mL | INTRAVENOUS | Status: DC | PRN

## 2015-12-16 MED ORDER — SODIUM CHLORIDE 0.9 % IV SOLN: 250.0000 mL | INTRAVENOUS | Status: DC | PRN

## 2015-12-16 MED ORDER — FUROSEMIDE 10 MG/ML IJ SOLN
40.0000 mg | Freq: Once | INTRAMUSCULAR | Status: AC
  Administered 2015-12-16: 40 mg via INTRAVENOUS
  Filled 2015-12-16: qty 4

## 2015-12-16 MED ORDER — HEPARIN SODIUM (PORCINE) 1000 UNIT/ML DIALYSIS
1000.0000 [IU] | INTRAMUSCULAR | Status: DC | PRN
  Filled 2015-12-16: qty 1

## 2015-12-16 MED ORDER — ACETAMINOPHEN 325 MG PO TABS
650.0000 mg | ORAL_TABLET | ORAL | Status: DC | PRN
  Administered 2015-12-16: 650 mg via ORAL
  Filled 2015-12-16: qty 2

## 2015-12-16 MED ORDER — METOLAZONE 5 MG PO TABS
5.0000 mg | ORAL_TABLET | Freq: Two times a day (BID) | ORAL | Status: DC
Start: 2015-12-16 — End: 2015-12-17
  Administered 2015-12-16 – 2015-12-17 (×3): 5 mg via ORAL
  Filled 2015-12-16 (×3): qty 1

## 2015-12-16 MED ORDER — SODIUM CHLORIDE 0.9% FLUSH
3.0000 mL | Freq: Two times a day (BID) | INTRAVENOUS | Status: DC
  Administered 2015-12-16: 3 mL via INTRAVENOUS

## 2015-12-16 MED ORDER — ALBUTEROL SULFATE HFA 108 (90 BASE) MCG/ACT IN AERS
2.0000 | INHALATION_SPRAY | RESPIRATORY_TRACT | Status: DC | PRN
  Administered 2015-12-16 (×2): 2 via RESPIRATORY_TRACT
  Filled 2015-12-16: qty 6.7

## 2015-12-16 MED ORDER — DILTIAZEM HCL ER COATED BEADS 180 MG PO CP24
180.0000 mg | ORAL_CAPSULE | Freq: Every day | ORAL | Status: DC
Start: 2015-12-16 — End: 2015-12-17
  Administered 2015-12-16 – 2015-12-17 (×2): 180 mg via ORAL
  Filled 2015-12-16 (×2): qty 1

## 2015-12-16 MED ORDER — ALTEPLASE 2 MG IJ SOLR
2.0000 mg | Freq: Once | INTRAMUSCULAR | Status: DC | PRN
  Filled 2015-12-16: qty 2

## 2015-12-16 MED ORDER — HEPARIN SODIUM (PORCINE) 1000 UNIT/ML DIALYSIS
20.0000 [IU]/kg | INTRAMUSCULAR | Status: DC | PRN
  Filled 2015-12-16: qty 2

## 2015-12-16 MED ORDER — SEVELAMER CARBONATE 800 MG PO TABS
3200.0000 mg | ORAL_TABLET | Freq: Three times a day (TID) | ORAL | Status: DC
  Administered 2015-12-16: 3200 mg via ORAL
  Filled 2015-12-16 (×2): qty 4

## 2015-12-16 MED ORDER — NITROGLYCERIN 2 % TD OINT
1.0000 [in_us] | TOPICAL_OINTMENT | Freq: Once | TRANSDERMAL | Status: AC
  Administered 2015-12-16: 1 [in_us] via TOPICAL
  Filled 2015-12-16: qty 1

## 2015-12-16 MED ORDER — HYDRALAZINE HCL 25 MG PO TABS
50.0000 mg | ORAL_TABLET | Freq: Three times a day (TID) | ORAL | Status: DC
  Administered 2015-12-16 – 2015-12-17 (×3): 50 mg via ORAL
  Filled 2015-12-16 (×3): qty 2

## 2015-12-16 MED ORDER — HYDRALAZINE HCL 20 MG/ML IJ SOLN
20.0000 mg | Freq: Once | INTRAMUSCULAR | Status: AC
  Administered 2015-12-16: 20 mg via INTRAVENOUS
  Filled 2015-12-16: qty 1

## 2015-12-16 MED ORDER — ZOLPIDEM TARTRATE 5 MG PO TABS
5.0000 mg | ORAL_TABLET | Freq: Every evening | ORAL | Status: DC | PRN
  Administered 2015-12-16: 5 mg via ORAL
  Filled 2015-12-16: qty 1

## 2015-12-16 MED ORDER — SODIUM CHLORIDE 0.9% FLUSH: 3.0000 mL | INTRAVENOUS | Status: DC | PRN

## 2015-12-16 MED ORDER — METOPROLOL TARTRATE 50 MG PO TABS
50.0000 mg | ORAL_TABLET | Freq: Two times a day (BID) | ORAL | Status: DC
  Administered 2015-12-16 – 2015-12-17 (×3): 50 mg via ORAL
  Filled 2015-12-16 (×3): qty 1

## 2015-12-16 NOTE — H&P (Signed)
History and Physical    KAMRY STIKA [id number] DOB: 1960/05/14 DOA: 12/15/2015  PCP: Purvis Kilts, MD  Patient coming from: home  Chief Complaint: sob  HPI: Steven Sellers is a 55 y.o. male with medical history significant of AKI recent placed on dialysis a month ago after presenting with AKI not responding to diuresis.  Pt does have h/o requiring dialysis several years ago after contrast exposure.  He has been on dialysis T/R/Sat.  He had a normal session Saturday.  He denies any fevers.  He is compliant with his medications and watches his salt intake.  He has chronic edema in his legs that has not improved or worsened in the last month.  He has dialysis catheter placed and has not undergone any evaluation for permanent shunt placement as the hopes are that his kidneys will improve and he can hopefully come off dialysis.  He makes urine, and responds to lasix normally.  Tonight he came in with more sob.  No cough.  No fevers.  He is fluid overloaded and referred for admission for the need of repeat dialysis session needed today.  ED Course:   Iv lasix, nephrology called  Review of Systems: As per HPI otherwise 10 point review of systems negative.   Past Medical History:  Diagnosis Date  . Acute renal failure (ARF) (HCC)    Several episodes of HD in 2012  . Alcohol use ([place])   . Chronic kidney disease   . Hypertension   . Muscular dystrophy ([place])   . Pulmonary embolism ([place]) 2011  . Tobacco use   . Vision abnormalities     Past Surgical History:  Procedure Laterality Date  . CARDIAC CATHETERIZATION  2002   normal coronary arteries  . CENTRAL VENOUS CATHETER INSERTION Right 12/04/2015   Procedure: INSERTION OF TUNNELED HEMODIALYSIS CATHETER RIGHT INTERNAL JUGULAR;  Surgeon: Vickie Epley, MD;  Location: [facility];  Service: General;  Laterality: Right;     reports that he has been smoking Cigarettes.  He started smoking about 41 years ago. He has been smoking about  1.00 pack per day. He has never used smokeless tobacco. He reports that he drinks about 14.4 oz of alcohol per week . He reports that he does not use drugs.  Allergies  Allergen Reactions  . Other Other (See Comments)    IV contrast- Renal issues    Family History  Problem Relation Age of Onset  . Hypertension Mother   . Heart attack Father     Prior to Admission medications   Medication Sig Start Date End Date Taking? Authorizing Provider  acetaminophen (TYLENOL 8 HOUR) 650 MG CR tablet Take 650 mg by mouth every 8 (eight) hours as needed for pain.   Yes Historical Provider, MD  diltiazem (TIAZAC) 180 MG 24 hr capsule Take 180 mg by mouth daily.  11/28/15  Yes Historical Provider, MD  furosemide (LASIX) 40 MG tablet Take 40 mg by mouth 2 (two) times daily. 12/11/15  Yes Historical Provider, MD  hydrALAZINE (APRESOLINE) 50 MG tablet Take 1 tablet (50 mg total) by mouth every 8 (eight) hours. 12/05/15  Yes Theodis Blaze, MD  metoprolol (LOPRESSOR) 50 MG tablet Take 50 mg by mouth 2 (two) times daily.   Yes Historical Provider, MD  sevelamer carbonate (RENVELA) 800 MG tablet Take 4 tablets (3,200 mg total) by mouth 3 (three) times daily with meals. 12/05/15  Yes Theodis Blaze, MD  metolazone (ZAROXOLYN) 5 MG  tablet Take 5 mg by mouth 2 (two) times daily. 12/07/15   Historical Provider, MD    Physical Exam: Vitals:   12/15/15 2330 12/16/15 0023 12/16/15 0030 12/16/15 0100  BP: 168/94 172/96 181/91 169/100  Pulse: 73 75 76 77  Resp: 19 20 20 21   Temp:  97.9 F (36.6 C)    TempSrc:  Oral    SpO2: 94% 95% 92% 93%  Weight:      Height:          Constitutional: NAD, calm, comfortable Vitals:   12/15/15 2330 12/16/15 0023 12/16/15 0030 12/16/15 0100  BP: 168/94 172/96 181/91 169/100  Pulse: 73 75 76 77  Resp: 19 20 20 21   Temp:  97.9 F (36.6 C)    TempSrc:  Oral    SpO2: 94% 95% 92% 93%  Weight:      Height:       Eyes: PERRL, lids and conjunctivae normal ENMT: Mucous  membranes are moist. Posterior pharynx clear of any exudate or lesions.Normal dentition.  Neck: normal, supple, no masses, no thyromegaly Respiratory: clear to auscultation bilaterally, no wheezing, no crackles. Normal respiratory effort. No accessory muscle use.  Cardiovascular: Regular rate and rhythm, no murmurs / rubs / gallops. 2-3 + extremity edema. 2+ pedal pulses. No carotid bruits.  Abdomen: no tenderness, no masses palpated. No hepatosplenomegaly. Bowel sounds positive.  Musculoskeletal: no clubbing / cyanosis. No joint deformity upper and lower extremities. Good ROM, no contractures. Normal muscle tone.  Skin: no rashes, lesions, ulcers. No induration Neurologic: CN 2-12 grossly intact. Sensation intact, DTR normal. Strength 5/5 in all 4.  Psychiatric: Normal judgment and insight. Alert and oriented x 3. Normal mood.    Labs on Admission: I have personally reviewed following labs and imaging studies  CBC:  Recent Labs Lab 12/15/15 2310  WBC 6.6  NEUTROABS 4.5  HGB 8.1*  HCT 24.6*  MCV 94.6  PLT [id number]   Basic Metabolic Panel:  Recent Labs Lab 12/15/15 2310  NA 133*  K 3.5  CL 102  CO2 26  GLUCOSE 104*  BUN 10  CREATININE 2.47*  CALCIUM 7.4*   GFR: Estimated Creatinine Clearance: 39 mL/min (by C-G formula based on SCr of 2.47 mg/dL). Liver Function Tests:  Recent Labs Lab 12/15/15 2310  AST 19  ALT 11*  ALKPHOS 67  BILITOT 0.4  PROT 5.7*  ALBUMIN 2.5*   Cardiac Enzymes:  Recent Labs Lab 12/15/15 2310  TROPONINI 0.04*   Urine analysis:    Component Value Date/Time   COLORURINE YELLOW 11/30/2015 1707   APPEARANCEUR HAZY (A) 11/30/2015 1707   LABSPEC 1.015 11/30/2015 1707   PHURINE 6.0 11/30/2015 1707   GLUCOSEU 250 (A) 11/30/2015 1707   HGBUR LARGE (A) 11/30/2015 1707   BILIRUBINUR NEGATIVE 11/30/2015 1707   KETONESUR NEGATIVE 11/30/2015 1707   PROTEINUR >300 (A) 11/30/2015 1707   UROBILINOGEN 0.2 04/11/2010 2336   NITRITE NEGATIVE  11/30/2015 1707   LEUKOCYTESUR NEGATIVE 11/30/2015 1707   Radiological Exams on Admission: Dg Chest 2 View  Result Date: 12/16/2015 CLINICAL DATA:  55 year old male with shortness of breath. EXAM: CHEST  2 VIEW COMPARISON:  Chest radiograph dated 12/04/2015 FINDINGS: There is diffuse interstitial prominence with Kerley B-lines compatible with interstitial edema. Pneumonia is not excluded. There is no focal consolidation, or pneumothorax. Small bilateral pleural effusions. The Cardiac silhouette is within normal limits. Right IJ dialysis catheter with tip over central SVC. No acute osseous pathology. IMPRESSION: Diffuse interstitial edema and small bilateral pleural  effusions. No focal consolidation. Electronically Signed   By: Anner Crete M.D.   On: 12/16/2015 00:16    Assessment/Plan 55 yo male ESRD on dialysis with volume overload needing extra dialysis session  Principal Problem:   ESRD needing dialysis ([place])- obs overnight for dialysis in the am.  Nephrology consulted and aware.  Supplemental oxygen.  Given lasix 40 iv in ED along with ntg paste, hypertensive on arrival with normal oxygen sats.  No respiratory distress.  Will also check cardiac echo in am, there was no echo done last month that I can find in epic.  \  Active Problems:   Essential hypertension- noted, resume home meds.  ntg paste placed.  Adjust meds as needed.   CKD (chronic kidney disease) requiring chronic dialysis ([place])- noted   SOB (shortness of breath)-  Noted, will cont to serial troponin overnight   obs in stepdown.     DVT prophylaxis: scds Code Status:   Full code  Thermon Zulauf A MD [facility]  If 7PM-7AM, please contact night-coverage [email or web address] Password [email or web address]  12/16/2015, 1:27 AM

## 2015-12-16 NOTE — ED Notes (Signed)
Patient transported to X-ray 

## 2015-12-16 NOTE — ED Notes (Signed)
CRITICAL VALUE ALERT  Critical value received:  Trop 0.04  Date of notification:  12/16/2015  Time of notification:  00:18  Critical value read back: yes  Nurse who received alert:  Atilano Median RN   MD notified (1st page):  Dr Betsey Holiday  Time of first page:  00:18  MD notified (2nd page):  Time of second page:  Responding MD:  Dr Betsey Holiday  Time MD responded:  00:18

## 2015-12-16 NOTE — ED Notes (Signed)
Pt informed of the delay in getting bed assignment. Will let pt know when bed is assigned.

## 2015-12-16 NOTE — Procedures (Signed)
   HEMODIALYSIS TREATMENT NOTE:  4 hour low-heparin dialysis completed via right IJ tunneled catheter. Exit site unremarkable. Goal met: 3.5 liters removed without interruption in ultrafiltration. All blood was returned.  Hemodynamically stable throughout HD session. Report called to [place], Therapist, sports.  Rockwell Alexandria, RN, CDN

## 2015-12-16 NOTE — Progress Notes (Signed)
Patient with hx of prior AKI, CKD, HTN, hx of PE, MD, admitted earlier today for volume overload, SOB, and was given IV lasix and NTP.  He was seen by Dr Chilton Greathouse and planned for 4 hours of dialysis today.  After which, he likely can come off the NTP.  He is not uremic, and K was 3.5. Hb was 8.1 grams and Cr was 2.5.  His BNP was in the 2K range, and CXR showed pulmonary edema with bilateral effusions but no infiltrate. Exam showed no wheezing, catheter on the right upper chest, soft murmur and slight bisilar rales. Will proceed with ultra filtration, d/c NTP OK after it, and continue with current plan.   Thank you,  Orvan Falconer MD FACP.  Hospitalist.

## 2015-12-16 NOTE — Consult Note (Signed)
Reason for Consult: Fluid overload Referring Physician: Dr. Olen Cordial is an 55 y.o. male.  HPI: She is a patient with history of hypertension, PE, acute kidney injury superimposed on chronic recently started on hemodialysis because of fluid overload. Presently came to the emergency room with complaints of difficulty in breathing, orthopnea and paroxysmal nocturnal dyspnea after he went home completing his dialysis. According to the patient was feeling okay before he went to dialysis. On dialysis he became lightheaded and they decreased his ultrafiltration to 11/2 L. Patient denies any chest pain. He has cough but no sputum production. Patient also denies any fever or chills or sweating. When he was evaluated patient was found to have fluid overload hence admitted to the hospital for dialysis.  Past Medical History:  Diagnosis Date  . Acute renal failure (ARF) (HCC)    Several episodes of HD in 2012  . Alcohol use ([place])   . Chronic kidney disease   . Hypertension   . Muscular dystrophy ([place])   . Pulmonary embolism ([place]) 2011  . Tobacco use   . Vision abnormalities     Past Surgical History:  Procedure Laterality Date  . CARDIAC CATHETERIZATION  2002   normal coronary arteries  . CENTRAL VENOUS CATHETER INSERTION Right 12/04/2015   Procedure: INSERTION OF TUNNELED HEMODIALYSIS CATHETER RIGHT INTERNAL JUGULAR;  Surgeon: Vickie Epley, MD;  Location: [facility];  Service: General;  Laterality: Right;    Family History  Problem Relation Age of Onset  . Hypertension Mother   . Heart attack Father     Social History:  reports that he has been smoking Cigarettes.  He started smoking about 41 years ago. He has been smoking about 1.00 pack per day. He has never used smokeless tobacco. He reports that he drinks about 14.4 oz of alcohol per week . He reports that he does not use drugs.  Allergies:  Allergies  Allergen Reactions  . Other Other (See Comments)    IV contrast- Renal  issues    Medications: I have reviewed the patient's current medications.  Results for orders placed or performed during the hospital encounter of 12/15/15 (from the past 48 hour(s))  CBC with Differential/Platelet     Status: Abnormal   Collection Time: 12/15/15 11:10 PM  Result Value Ref Range   WBC 6.6 4.0 - 10.5 K/uL   RBC 2.60 (L) 4.22 - 5.81 MIL/uL   Hemoglobin 8.1 (L) 13.0 - 17.0 g/dL   HCT 24.6 (L) 39.0 - 52.0 %   MCV 94.6 78.0 - 100.0 fL   MCH 31.2 26.0 - 34.0 pg   MCHC 32.9 30.0 - 36.0 g/dL   RDW 13.6 11.5 - 15.5 %   Platelets 276 150 - 400 K/uL   Neutrophils Relative % 68 %   Neutro Abs 4.5 1.7 - 7.7 K/uL   Lymphocytes Relative 18 %   Lymphs Abs 1.2 0.7 - 4.0 K/uL   Monocytes Relative 10 %   Monocytes Absolute 0.7 0.1 - 1.0 K/uL   Eosinophils Relative 3 %   Eosinophils Absolute 0.2 0.0 - 0.7 K/uL   Basophils Relative 1 %   Basophils Absolute 0.1 0.0 - 0.1 K/uL  Comprehensive metabolic panel     Status: Abnormal   Collection Time: 12/15/15 11:10 PM  Result Value Ref Range   Sodium 133 (L) 135 - 145 mmol/L   Potassium 3.5 3.5 - 5.1 mmol/L   Chloride 102 101 - 111 mmol/L   CO2  26 22 - 32 mmol/L   Glucose, Bld 104 (H) 65 - 99 mg/dL   BUN 10 6 - 20 mg/dL   Creatinine, Ser 2.47 (H) 0.61 - 1.24 mg/dL   Calcium 7.4 (L) 8.9 - 10.3 mg/dL   Total Protein 5.7 (L) 6.5 - 8.1 g/dL   Albumin 2.5 (L) 3.5 - 5.0 g/dL   AST 19 15 - 41 U/L   ALT 11 (L) 17 - 63 U/L   Alkaline Phosphatase 67 38 - 126 U/L   Total Bilirubin 0.4 0.3 - 1.2 mg/dL   GFR calc non Af Amer 28 (L) >60 mL/min   GFR calc Af Amer 32 (L) >60 mL/min    Comment: (NOTE) The eGFR has been calculated using the CKD EPI equation. This calculation has not been validated in all clinical situations. eGFR's persistently <60 mL/min signify possible Chronic Kidney Disease.    Anion gap 5 5 - 15  Troponin I     Status: Abnormal   Collection Time: 12/15/15 11:10 PM  Result Value Ref Range   Troponin I 0.04 (HH) <0.03  ng/mL    Comment: CRITICAL RESULT CALLED TO, READ BACK BY AND VERIFIED WITH:  POINDEXTER,M @ 0016 ON 12/16/15 BY JUW   Brain natriuretic peptide     Status: Abnormal   Collection Time: 12/15/15 11:10 PM  Result Value Ref Range   B Natriuretic Peptide 2,072.0 (H) 0.0 - 100.0 pg/mL  Troponin I     Status: Abnormal   Collection Time: 12/16/15  4:58 AM  Result Value Ref Range   Troponin I 0.04 (HH) <0.03 ng/mL    Comment: CRITICAL VALUE NOTED.  VALUE IS CONSISTENT WITH PREVIOUSLY REPORTED AND CALLED VALUE.  TSH     Status: None   Collection Time: 12/16/15  4:58 AM  Result Value Ref Range   TSH 0.758 0.350 - 4.500 uIU/mL    Dg Chest 2 View  Result Date: 12/16/2015 CLINICAL DATA:  55 year old male with shortness of breath. EXAM: CHEST  2 VIEW COMPARISON:  Chest radiograph dated 12/04/2015 FINDINGS: There is diffuse interstitial prominence with Kerley B-lines compatible with interstitial edema. Pneumonia is not excluded. There is no focal consolidation, or pneumothorax. Small bilateral pleural effusions. The Cardiac silhouette is within normal limits. Right IJ dialysis catheter with tip over central SVC. No acute osseous pathology. IMPRESSION: Diffuse interstitial edema and small bilateral pleural effusions. No focal consolidation. Electronically Signed   By: Anner Crete M.D.   On: 12/16/2015 00:16    Review of Systems  Constitutional: Negative for chills and fever.  Respiratory: Positive for cough, shortness of breath and wheezing.   Cardiovascular: Positive for orthopnea, leg swelling and PND.  Gastrointestinal: Negative for nausea and vomiting.   Blood pressure (!) 151/88, pulse 78, temperature 97 F (36.1 C), temperature source Oral, resp. rate (!) 22, height '6\' 2"'  (1.88 m), weight 81.6 kg (179 lb 14.3 oz), SpO2 99 %. Physical Exam  Constitutional: No distress.  Neck: JVD present.  Cardiovascular: Normal rate and regular rhythm.   Murmur heard. Respiratory: No respiratory  distress. He has wheezes. He has rales.  GI: He exhibits no distension. There is no tenderness. There is no rebound.  Musculoskeletal: He exhibits edema.    Assessment/Plan: Problem #1 difficulty in breathing: Most likely secondary to uncontrolled salt and fluid intake. This was complicated by possible hypotension during dialysis and difficulty in ultrafiltration. Problem #2 renal failure: As stated above. Patient on dialysis. He doesn't have any nausea or vomiting.  Problem #3 anemia: His hemoglobin is low Problem #4. Metabolic bone disease: His calcium is range phosphorus is not available. Problem #5 hypertension: His blood pressure this morning is high Problem #6 history of PE Problem #7 history of muscular dystrophy. Plan: We'll dialyze patient for 4 hours today and remove about 3 L if his blood pressure tolerates. 2] we'll evaluate his condition tomorrow and possibly give him another dialysis. 3] we'll use Epogen 10,000 units IV after each dialysis 4] patient advised to decrease his salt and fluid intake 5] we'll check his renal panel and CBC in the morning.  Noris Kulinski S 12/16/2015, 8:20 AM

## 2015-12-16 NOTE — ED Notes (Signed)
Pt carried outside by wheelchair while still on monitor. O2 sats remained @ 96% without O2. Pt returned to room & back on the stretcher.

## 2015-12-17 ENCOUNTER — Inpatient Hospital Stay (HOSPITAL_COMMUNITY): Payer: BLUE CROSS/BLUE SHIELD

## 2015-12-17 DIAGNOSIS — R0600 Dyspnea, unspecified: Secondary | ICD-10-CM

## 2015-12-17 DIAGNOSIS — Z992 Dependence on renal dialysis: Secondary | ICD-10-CM

## 2015-12-17 DIAGNOSIS — N186 End stage renal disease: Secondary | ICD-10-CM

## 2015-12-17 DIAGNOSIS — I10 Essential (primary) hypertension: Secondary | ICD-10-CM

## 2015-12-17 LAB — CBC
HCT: 27.9 % — ABNORMAL LOW (ref 39.0–52.0)
HEMOGLOBIN: 9.5 g/dL — AB (ref 13.0–17.0)
MCH: 31.7 pg (ref 26.0–34.0)
MCHC: 34.1 g/dL (ref 30.0–36.0)
MCV: 93 fL (ref 78.0–100.0)
Platelets: 341 10*3/uL (ref 150–400)
RBC: 3 MIL/uL — AB (ref 4.22–5.81)
RDW: 13.4 % (ref 11.5–15.5)
WBC: 7.8 10*3/uL (ref 4.0–10.5)

## 2015-12-17 LAB — RENAL FUNCTION PANEL
ANION GAP: 9 (ref 5–15)
Albumin: 2.9 g/dL — ABNORMAL LOW (ref 3.5–5.0)
BUN: 8 mg/dL (ref 6–20)
CALCIUM: 8.2 mg/dL — AB (ref 8.9–10.3)
CHLORIDE: 97 mmol/L — AB (ref 101–111)
CO2: 29 mmol/L (ref 22–32)
Creatinine, Ser: 2.51 mg/dL — ABNORMAL HIGH (ref 0.61–1.24)
GFR calc non Af Amer: 27 mL/min — ABNORMAL LOW (ref >60)
GFR, EST AFRICAN AMERICAN: 32 mL/min — AB (ref >60)
GLUCOSE: 112 mg/dL — AB (ref 65–99)
Phosphorus: 3 mg/dL (ref 2.5–4.6)
Potassium: 3.9 mmol/L (ref 3.5–5.1)
SODIUM: 135 mmol/L (ref 135–145)

## 2015-12-17 LAB — BASIC METABOLIC PANEL
ANION GAP: 6 (ref 5–15)
BUN: 8 mg/dL (ref 6–20)
CALCIUM: 8 mg/dL — AB (ref 8.9–10.3)
CHLORIDE: 98 mmol/L — AB (ref 101–111)
CO2: 30 mmol/L (ref 22–32)
CREATININE: 2.49 mg/dL — AB (ref 0.61–1.24)
GFR calc Af Amer: 32 mL/min — ABNORMAL LOW (ref >60)
GFR calc non Af Amer: 27 mL/min — ABNORMAL LOW (ref >60)
GLUCOSE: 113 mg/dL — AB (ref 65–99)
Potassium: 4 mmol/L (ref 3.5–5.1)
Sodium: 134 mmol/L — ABNORMAL LOW (ref 135–145)

## 2015-12-17 LAB — ECHOCARDIOGRAM COMPLETE
HEIGHTINCHES: 74 in
WEIGHTICAEL: 2733.7 [oz_av]

## 2015-12-17 NOTE — Progress Notes (Signed)
Patient is refusing to keep heart monitor and leads on. Puts them on and then removes them. Sitting in room in corner by window in no distress and very pleasant mood, but will not stay in the bed and connected to the monitor. Continues to walk around unit against advice of nursing staff who is constantly reminding him he needs to wear his heart monitor and that it is the doctor's orders to do so.

## 2015-12-17 NOTE — Progress Notes (Signed)
Discharge instructions including F/U appts and meds discussed with pt. All questions answered. IV removed no complications. Pt taken out with belongings via [place] by RN.

## 2015-12-17 NOTE — Care Management Note (Signed)
Case Management Note  Patient Details  Name: DSHAUN DEETS MRN: [id number] Date of Birth: 03-Nov-1960  Subjective/Objective:                  Pt is from home, lives with his wife ind is with ADL's. Pt is on HD three days a week. Pt drives himself to appointments. Pt has PCP and insurance with medication coverage.   Action/Plan: Pt discharging home today with self care. No CM needs.   Expected Discharge Date:  12/20/15               Expected Discharge Plan:  Home/Self Care  In-House Referral:  NA  Discharge planning Services  CM Consult  Post Acute Care Choice:  NA Choice offered to:  NA  DME Arranged:    DME Agency:     HH Arranged:    HH Agency:     Status of Service:     If discussed at [employer] of Stay Meetings, dates discussed:    Additional Comments:  Sherald Barge, RN 12/17/2015, 3:36 PM

## 2015-12-17 NOTE — Progress Notes (Signed)
*  PRELIMINARY RESULTS* Echocardiogram 2D Echocardiogram has been performed.  Steven Sellers 12/17/2015, 9:30 AM

## 2015-12-17 NOTE — Progress Notes (Signed)
Subjective: Interval History: has no complaint of difficulty in breathing or orthopnea patient presently feels much better..  Objective: Vital signs in last 24 hours: Temp:  [97 F (36.1 C)-98.8 F (37.1 C)] 97.8 F (36.6 C) (08/07 0802) Pulse Rate:  [64-80] 71 (08/07 0000) Resp:  [15-20] 18 (08/07 0000) BP: (147-183)/(62-95) 156/84 (08/07 0800) SpO2:  [93 %-100 %] 94 % (08/07 0000) Weight:  [77.5 kg (170 lb 13.7 oz)-81.8 kg (180 lb 5.4 oz)] 77.5 kg (170 lb 13.7 oz) (08/07 0500) Weight change: 0.153 kg (5.4 oz)  Intake/Output from previous day: 08/06 0701 - 08/07 0700 In: 240 [P.O.:240] Out: 3655 [Urine:200] Intake/Output this shift: No intake/output data recorded.  General appearance: alert, cooperative and no distress Resp: clear to auscultation bilaterally Cardio: regular rate and rhythm, S1, S2 normal, no murmur, click, rub or gallop GI: soft, non-tender; bowel sounds normal; no masses,  no organomegaly Extremities: edema 1+ edema  Lab Results:  Recent Labs  12/15/15 2310 12/17/15 0422  WBC 6.6 7.8  HGB 8.1* 9.5*  HCT 24.6* 27.9*  PLT 276 341   BMET:  Recent Labs  12/15/15 2310 12/17/15 0422  NA 133* 135  134*  K 3.5 3.9  4.0  CL 102 97*  98*  CO2 26 29  30   GLUCOSE 104* 112*  113*  BUN 10 8  8   CREATININE 2.47* 2.51*  2.49*  CALCIUM 7.4* 8.2*  8.0*   No results for input(s): PTH in the last 72 hours. Iron Studies: No results for input(s): IRON, TIBC, TRANSFERRIN, FERRITIN in the last 72 hours.  Studies/Results: Dg Chest 2 View  Result Date: 12/16/2015 CLINICAL DATA:  55 year old male with shortness of breath. EXAM: CHEST  2 VIEW COMPARISON:  Chest radiograph dated 12/04/2015 FINDINGS: There is diffuse interstitial prominence with Kerley B-lines compatible with interstitial edema. Pneumonia is not excluded. There is no focal consolidation, or pneumothorax. Small bilateral pleural effusions. The Cardiac silhouette is within normal limits. Right IJ  dialysis catheter with tip over central SVC. No acute osseous pathology. IMPRESSION: Diffuse interstitial edema and small bilateral pleural effusions. No focal consolidation. Electronically Signed   By: Anner Crete M.D.   On: 12/16/2015 00:16    I have reviewed the patient's current medications.  Assessment/Plan: Problem #1 difficulty in breathing most likely from fluid overload. He was status post dialysis yesterday and we are able to remove about 3 1/2 liters and patient feels much better. Still he has some edema. Presently he is asymptomatic. Problem #2 end-stage renal disease: Presently doesn't have any uremic signs and symptoms. His potassium is good Problem #3 metabolic bone disease: His calcium and phosphorus is range Problem #4 hypertension: His blood pressure is reasonably controlled Problem #5 anemia: His hemoglobin is below our target goal and patient is on Epogen Problem #6 history of muscular dystrophy Plan: We'll dialyze patient for 31/2hours today 2] we'll try to remove another 21/2 liters today and patient could be discharged to be followed as an outpatient.     LOS: 1 day   Elaine Roanhorse S 12/17/2015,8:28 AM

## 2015-12-17 NOTE — Progress Notes (Signed)
Patient requesting to walk around in unit. States he feels much better. Is using cane and ambulating fine with no difficulty or shortness of breath.

## 2015-12-17 NOTE — Discharge Summary (Signed)
Physician Discharge Summary  Steven Sellers [id number] DOB: June 07, 1960 DOA: 12/15/2015  PCP: Purvis Kilts, MD  Admit date: 12/15/2015 Discharge date: 12/17/2015  Time spent: 35 minutes  Recommendations for Outpatient Follow-up:  1. Follow up with Dr Chilton Greathouse as scheduled for your dialysis. 2. Follow up with PCP next week.    Discharge Diagnoses:  Principal Problem:   ESRD needing dialysis [facility]) Active Problems:   Essential hypertension   CHF (congestive heart failure) (HCC)   CKD (chronic kidney disease) requiring chronic dialysis (HCC)   SOB (shortness of breath)   Discharge Condition: Much improved with no SOB and lungs were clear on exam.   Diet recommendation: Renal and cardiac diet.   Filed Weights   12/16/15 0604 12/16/15 1510 12/17/15 0500  Weight: 81.6 kg (179 lb 14.3 oz) 81.8 kg (180 lb 5.4 oz) 77.5 kg (170 lb 13.7 oz)    History of present illness: Patient was admitted into the hospital for SOB, felt to be due to volume overload requiring urgent dialysis by Dr Shanon Brow.  As per her H and P:  " Steven Sellers is a 55 y.o. male with medical history significant of AKI recent placed on dialysis a month ago after presenting with AKI not responding to diuresis.  Pt does have h/o requiring dialysis several years ago after contrast exposure.  He has been on dialysis T/R/Sat.  He had a normal session Saturday.  He denies any fevers.  He is compliant with his medications and watches his salt intake.  He has chronic edema in his legs that has not improved or worsened in the last month.  He has dialysis catheter placed and has not undergone any evaluation for permanent shunt placement as the hopes are that his kidneys will improve and he can hopefully come off dialysis.  He makes urine, and responds to lasix normally.  Tonight he came in with more sob.  No cough.  No fevers.  He is fluid overloaded and referred for admission for the need of repeat dialysis session needed  today.  Hospital Course:  Patient with hx of prior AKI, CKD, HTN, hx of PE, MD, admitted  for volume overload, SOB, and was given IV lasix and NTP.  He was seen by Dr Chilton Greathouse and had 4 hours of dialysis the first day of admission, and he felt significantly better  After that, he had another dialysis session today.   He is not uremic, and K was 3.5. Hb was 8.1 grams and Cr was 2.5.  His BNP was in the 2K range, and CXR showed pulmonary edema with bilateral effusions but no infiltrate. After dialysis, he is no longer SOB, anxious to go home, and is stable for discharge.  He will continue follow up with Dr Chilton Greathouse for routine dialysis, and follow up with his PCP as scheduled.    Consultations:  Renal:  Dr Lafayette Dragon.   Discharge Exam: Vitals:   12/17/15 1430 12/17/15 1500  BP: (!) 178/97 (!) 180/94  Pulse: (!) 58 (!) 59  Resp:    Temp:      Discharge Instructions   Discharge Instructions    Diet - low sodium heart healthy    Complete by:  As directed   Discharge instructions    Complete by:  As directed   Avoid salty food.  Follow up with Dr Chilton Greathouse for your routine dialysis.  See your PCP as scheduled.   Increase activity slowly    Complete by:  As directed  Current Discharge Medication List    CONTINUE these medications which have NOT CHANGED   Details  acetaminophen (TYLENOL 8 HOUR) 650 MG CR tablet Take 650 mg by mouth every 8 (eight) hours as needed for pain.    diltiazem (TIAZAC) 180 MG 24 hr capsule Take 180 mg by mouth daily.  Refills: 0    furosemide (LASIX) 40 MG tablet Take 40 mg by mouth 2 (two) times daily. Refills: 0    hydrALAZINE (APRESOLINE) 50 MG tablet Take 1 tablet (50 mg total) by mouth every 8 (eight) hours. Qty: 90 tablet, Refills: 0    metoprolol (LOPRESSOR) 50 MG tablet Take 50 mg by mouth 2 (two) times daily.    sevelamer carbonate (RENVELA) 800 MG tablet Take 4 tablets (3,200 mg total) by mouth 3 (three) times daily with meals. Qty: 30  tablet, Refills: 1    metolazone (ZAROXOLYN) 5 MG tablet Take 5 mg by mouth 2 (two) times daily. Refills: 0       Allergies  Allergen Reactions  . Other Other (See Comments)    IV contrast- Renal issues      The results of significant diagnostics from this hospitalization (including imaging, microbiology, ancillary and laboratory) are listed below for reference.    Significant Diagnostic Studies: Dg Chest 2 View  Result Date: 12/16/2015 CLINICAL DATA:  55 year old male with shortness of breath. EXAM: CHEST  2 VIEW COMPARISON:  Chest radiograph dated 12/04/2015 FINDINGS: There is diffuse interstitial prominence with Kerley B-lines compatible with interstitial edema. Pneumonia is not excluded. There is no focal consolidation, or pneumothorax. Small bilateral pleural effusions. The Cardiac silhouette is within normal limits. Right IJ dialysis catheter with tip over central SVC. No acute osseous pathology. IMPRESSION: Diffuse interstitial edema and small bilateral pleural effusions. No focal consolidation. Electronically Signed   By: Anner Crete M.D.   On: 12/16/2015 00:16   [place] Intraoperative  Result Date: 12/04/2015 CLINICAL DATA:  Ultrasound was provided for use by the ordering physician, and a technical charge was applied by the performing facility.  No radiologist interpretation/professional services rendered.   US Renal  Result Date: 11/30/2015 CLINICAL DATA:  Acute kidney injury EXAM: RENAL / URINARY TRACT ULTRASOUND COMPLETE COMPARISON:  MRI abdomen 04/07/2010 FINDINGS: Right Kidney: Length: 10.7 cm.  No mass or hydronephrosis. Left Kidney: Length: 10.5 cm.  No mass or hydronephrosis. Bladder: Within normal limits. IMPRESSION: Negative renal ultrasound. Electronically Signed   By: Julian Hy M.D.   On: 11/30/2015 15:36   [place] Ue Vein Mapping  Result Date: 12/04/2015 CLINICAL DATA:  55 year old male with end-stage renal disease in need of hemodialysis. Left upper extremity  venous mapping study. EXAM: LEFT UPPER EXTREMITY VENOUS DOPPLER ULTRASOUND TECHNIQUE: Gray-scale sonography with color Doppler and duplex ultrasound were performed to evaluate the upper extremity deep venous system. COMPARISON:  Left upper extremity DVT study performed [id number] FINDINGS: Cephalic Vein: Ranges in size from 3.4-5.3 mm. Approximately 5-8 mm deep to the skin surface. Basilic Vein: Ranges in size from 3.1 - 4.3 mm in the upper arm to 1.5 - 3 mm in the forearm. Approximately 4- 7 mm deep to the skin surface. Brachial Veins: Ranges in size from 1.8-3.4 mm. Approximately 9-11 mm deep to the skin surface. Radial artery:  3.2 mm.  Normal triphasic flow. Brachial artery:  4.2-5.4 mm.  Normal triphasic arterial flow. IMPRESSION: Left upper extremity arteriovenous mapping as above. Electronically Signed   By: Jacqulynn Cadet M.D.   On: 12/04/2015 16:22  US Venous  Img Upper Uni Left  Result Date: 12/02/2015 CLINICAL DATA:  55 year old male with left upper extremity swelling. EXAM: LEFT UPPER EXTREMITY VENOUS DOPPLER ULTRASOUND TECHNIQUE: Gray-scale sonography with graded compression, as well as color Doppler and duplex ultrasound were performed to evaluate the upper extremity deep venous system from the level of the subclavian vein and including the jugular, axillary, basilic, radial, ulnar and upper cephalic vein. Spectral Doppler was utilized to evaluate flow at rest and with distal augmentation maneuvers. COMPARISON:  None. FINDINGS: Contralateral Subclavian Vein: Respiratory phasicity is normal and symmetric with the symptomatic side. No evidence of thrombus. Normal compressibility. Internal Jugular Vein: No evidence of thrombus. Normal compressibility, respiratory phasicity and response to augmentation. Subclavian Vein: No evidence of thrombus. Normal compressibility, respiratory phasicity and response to augmentation. Axillary Vein: No evidence of thrombus. Normal compressibility, respiratory  phasicity and response to augmentation. Cephalic Vein: No evidence of thrombus. Normal compressibility, respiratory phasicity and response to augmentation. Basilic Vein: No evidence of thrombus. Normal compressibility, respiratory phasicity and response to augmentation. Brachial Veins: No evidence of thrombus. Normal compressibility, respiratory phasicity and response to augmentation. Radial Veins: No evidence of thrombus. Normal compressibility, respiratory phasicity and response to augmentation. Ulnar Veins: No evidence of thrombus. Normal compressibility, respiratory phasicity and response to augmentation. Venous Reflux:  None visualized. Other Findings:  None visualized. IMPRESSION: No evidence of deep venous thrombosis. Electronically Signed   By: Jacqulynn Cadet M.D.   On: 12/02/2015 11:12  [place] Upper Ext Art Left Ltd  Result Date: 12/04/2015 CLINICAL DATA:  55 year old male with end-stage renal disease in need of hemodialysis. Left upper extremity venous mapping study. EXAM: LEFT UPPER EXTREMITY VENOUS DOPPLER ULTRASOUND TECHNIQUE: Gray-scale sonography with color Doppler and duplex ultrasound were performed to evaluate the upper extremity deep venous system. COMPARISON:  Left upper extremity DVT study performed [id number] FINDINGS: Cephalic Vein: Ranges in size from 3.4-5.3 mm. Approximately 5-8 mm deep to the skin surface. Basilic Vein: Ranges in size from 3.1 - 4.3 mm in the upper arm to 1.5 - 3 mm in the forearm. Approximately 4- 7 mm deep to the skin surface. Brachial Veins: Ranges in size from 1.8-3.4 mm. Approximately 9-11 mm deep to the skin surface. Radial artery:  3.2 mm.  Normal triphasic flow. Brachial artery:  4.2-5.4 mm.  Normal triphasic arterial flow. IMPRESSION: Left upper extremity arteriovenous mapping as above. Electronically Signed   By: Jacqulynn Cadet M.D.   On: 12/04/2015 16:22  Dg Chest Port 1 View  Result Date: 12/04/2015 CLINICAL DATA:  Hemodialysis catheter placement EXAM:  PORTABLE CHEST 1 VIEW COMPARISON:  None. FINDINGS: Right dialysis catheter has been placed with the tip near the confluence of the innominate veins. No pneumothorax. IMPRESSION: Left lower lobe atelectasis or infiltrate. Mild bronchitic changes. Right dialysis catheter tip near the confluence of the innominate veins. Electronically Signed   By: Rolm Baptise M.D.   On: 12/04/2015 11:21  Dg C-arm 1-60 Min-no Report  Result Date: 12/04/2015 CLINICAL DATA: insertion of tunneled dialysis catheter C-ARM 1-60 MINUTES Fluoroscopy was utilized by the requesting physician.  No radiographic interpretation.    Microbiology: Recent Results (from the past 240 hour(s))  MRSA PCR Screening     Status: None   Collection Time: 12/16/15  5:58 AM  Result Value Ref Range Status   MRSA by PCR NEGATIVE NEGATIVE Final    Comment:        The GeneXpert MRSA Assay (FDA approved for NASAL specimens only), is one component of a comprehensive  MRSA colonization surveillance program. It is not intended to diagnose MRSA infection nor to guide or monitor treatment for MRSA infections.      Labs: Basic Metabolic Panel:  Recent Labs Lab 12/15/15 2310 12/17/15 0422  NA 133* 135  134*  K 3.5 3.9  4.0  CL 102 97*  98*  CO2 26 29  30   GLUCOSE 104* 112*  113*  BUN 10 8  8   CREATININE 2.47* 2.51*  2.49*  CALCIUM 7.4* 8.2*  8.0*  PHOS  --  3.0   Liver Function Tests:  Recent Labs Lab 12/15/15 2310 12/17/15 0422  AST 19  --   ALT 11*  --   ALKPHOS 67  --   BILITOT 0.4  --   PROT 5.7*  --   ALBUMIN 2.5* 2.9*   No results for input(s): LIPASE, AMYLASE in the last 168 hours. No results for input(s): AMMONIA in the last 168 hours. CBC:  Recent Labs Lab 12/15/15 2310 12/17/15 0422  WBC 6.6 7.8  NEUTROABS 4.5  --   HGB 8.1* 9.5*  HCT 24.6* 27.9*  MCV 94.6 93.0  PLT 276 341   Cardiac Enzymes:  Recent Labs Lab 12/15/15 2310 12/16/15 0458 12/16/15 1042 12/16/15 1629  TROPONINI 0.04*  0.04* 0.03* 0.04*   BNP: BNP (last 3 results)  Recent Labs  12/15/15 2310  BNP 2,072.0*    Signed:  Bryttney Netzer MD. Rosalita Chessman.  [facility] 12/17/2015, 3:27 PM

## 2015-12-24 ENCOUNTER — Encounter (HOSPITAL_COMMUNITY): Payer: Self-pay

## 2015-12-24 ENCOUNTER — Encounter (HOSPITAL_COMMUNITY)
Admission: RE | Admit: 2015-12-24 | Discharge: 2015-12-24 | Disposition: A | Discharge status: Home / Self Care | Payer: BLUE CROSS/BLUE SHIELD | Source: Ambulatory Visit | Attending: Surgery | Admitting: Surgery

## 2015-12-24 NOTE — H&P (Signed)
SURGICAL HISTORY & PHYSICAL   HISTORY OF PRESENT ILLNESS (HPI):  55 y.o. Right-handed male presented upon referral to the ED for abnormal Cr (9.41 vs 1.87 when last checked almost 8 months prior) with significant bilateral lower extremity edema x few weeks, decreased urination, and shortness of breath. Patient previously required hemodialysis once 6 years ago after he received IV contrast for a PE, after which he was lost to nephrology follow-up. During his recent admission, a Right femoral vein non-tunneled catheter was inserted via which patient was dialyzed, after which a Right IJ tunneled hemodialysis catheter was inserted for subsequent outpatient hemodialysis.  When seen for follow-up, his tunneled Right IJ catheter was reported to be functioning without difficulty, though patient reported not enough fluid was removed during dialysis, prompting admission for additional hemodialysis and fluid removal, after which he felt much better and was able to breathe easier. Patient otherwise denied fever/chills, CP, or SOB after additional fluid was dialyzed/removed, and patient denies any history of upper extremity swelling or prior long-term indwelling central venous catheters.  PAST MEDICAL HISTORY (PMH):  Past Medical History:  Diagnosis Date  . Acute renal failure (ARF) (HCC)    Several episodes of HD in 2012  . Alcohol use ([place])   . Chronic kidney disease   . Hypertension   . Muscular dystrophy ([place])   . Pulmonary embolism ([place]) 2011  . Tobacco use   . Vision abnormalities     Reviewed. Otherwise negative.   PAST SURGICAL HISTORY ([place]):  Past Surgical History:  Procedure Laterality Date  . CARDIAC CATHETERIZATION  2002   normal coronary arteries  . CENTRAL VENOUS CATHETER INSERTION Right 12/04/2015   Procedure: INSERTION OF TUNNELED HEMODIALYSIS CATHETER RIGHT INTERNAL JUGULAR;  Surgeon: Vickie Epley, MD;  Location: [facility];  Service: General;  Laterality: Right;    Reviewed.  Otherwise negative.   MEDICATIONS:  Prior to Admission medications   Medication Sig Start Date End Date Taking? Authorizing Provider  acetaminophen (TYLENOL 8 HOUR) 650 MG CR tablet Take 650 mg by mouth every 8 (eight) hours as needed for pain.    Historical Provider, MD  diltiazem (TIAZAC) 180 MG 24 hr capsule Take 180 mg by mouth daily.  11/28/15   Historical Provider, MD  furosemide (LASIX) 40 MG tablet Take 40 mg by mouth 2 (two) times daily. 12/11/15   Historical Provider, MD  hydrALAZINE (APRESOLINE) 50 MG tablet Take 1 tablet (50 mg total) by mouth every 8 (eight) hours. 12/05/15   Theodis Blaze, MD  metolazone (ZAROXOLYN) 5 MG tablet Take 5 mg by mouth 2 (two) times daily. 12/07/15   Historical Provider, MD  metoprolol (LOPRESSOR) 50 MG tablet Take 50 mg by mouth 2 (two) times daily.    Historical Provider, MD  sevelamer carbonate (RENVELA) 800 MG tablet Take 4 tablets (3,200 mg total) by mouth 3 (three) times daily with meals. 12/05/15   Theodis Blaze, MD     ALLERGIES:  Allergies  Allergen Reactions  . Other Other (See Comments)    IV contrast- Renal issues     SOCIAL HISTORY:  Social History   Social History  . Marital status: Married    Spouse name: N/A  . Number of children: N/A  . Years of education: N/A   Occupational History  . Not on file.   Social History Main Topics  . Smoking status: Current Every Day Smoker    Packs/day: 1.00    Types: Cigarettes    Start date:  05/12/1974  . Smokeless tobacco: Never Used  . Alcohol use 14.4 oz/week    24 Standard drinks or equivalent per week     Comment: occ  . Drug use: No  . Sexual activity: Not on file   Other Topics Concern  . Not on file   Social History Narrative  . No narrative on file    The patient currently resides (home / rehab facility / nursing home): Home  The patient normally is (ambulatory / bedbound) : Ambulatory   FAMILY HISTORY:  Family History  Problem Relation Age of Onset  . Hypertension  Mother   . Heart attack Father     Otherwise negative.   REVIEW OF SYSTEMS:  Constitutional: denies any other weight loss, fever, chills, or sweats  Eyes: denies any other vision changes, history of eye injury  ENT: denies sore throat, hearing problems  Respiratory: denies shortness of breath, wheezing  Cardiovascular: denies chest pain, palpitations  Gastrointestinal: denies N/V, diarrhea  Musculoskeletal: denies any other joint pains or cramps  Skin: Denies any other rashes or skin discolorations  Neurological: denies any other headache, dizziness, weakness  Psychiatric: denies any other depression, anxiety   All other review of systems were otherwise negative.  VITAL SIGNS AS OF [id number]:  Systolic [id number]: Diastolic 74: Heart Rate 65: Temp 37C (Temporal) Height 188 cm   Weight 81.65 kg   BMI: 23.11 kg/m  PHYSICAL EXAM:  Constitutional:  -- Normal body habitus  -- Awake, alert, and oriented x3  Eyes:  -- Pupils equally round and reactive to light  -- No scleral icterus  Ear, nose, throat:  -- No jugular venous distension  Pulmonary:  -- No crackles  -- Equal breath sounds bilaterally  Cardiovascular:  -- S1, S2 present  -- No pericardial rubs  Gastrointestinal:  -- Abdomen soft, nontender, nondistended, no guarding/rebound  -- No abdominal masses appreciated, pulsatile or otherwise  Musculoskeletal / Integumentary:  -- Wounds or skin discoloration: None except catheter site as described below -- Extremities: B/L UE and LE FROM, hands and feet warm, no edema -- Right IJ/chest tunneled hemodialysis catheter well-secured without erythema or drainage, dressing c/d/i  Neurologic:  -- Motor function: Intact and symmetric -- Sensation: Intact and symmetric  Pulse/Doppler Exam: (p=palpable; d=doppler signals; 0=none)    Right   Left   Brach  p   p   Rad  p   p    Labs:  CBC:  Lab Results  Component Value Date   WBC 7.8 12/17/2015   RBC 3.00 (L) 12/17/2015   BMP:   Lab Results  Component Value Date   GLUCOSE 113 (H) 12/17/2015   GLUCOSE 112 (H) 12/17/2015   CO2 30 12/17/2015   CO2 29 12/17/2015   BUN 8 12/17/2015   BUN 8 12/17/2015   CREATININE 2.49 (H) 12/17/2015   CREATININE 2.51 (H) 12/17/2015   CALCIUM 8.0 (L) 12/17/2015   CALCIUM 8.2 (L) 12/17/2015     Imaging studies:  Left upper extremity venous duplex mapping for hemodialysis access planning (12/04/2015): There is no evidence of acute deep vein thrombosis in the upper extremity. The patent cephalic vein in the forearm measures between 3.4 mm and 3.8 mm with no tributary(ies) noted in the forearm, while the patent cephalic vein in the arm measures between 4.1 mm and 5.3 mm in diameter and is 3.2 mm deep with no tributary(ies) noted in the mid-arm. There was an IV site noted in the Left antecubital fossa. The patent  basilic vein in the arm measures between 3.1 mm and 4.3 mm in diameter and is 5.3 mm deep with no tributary(ies) in the mid-arm. The axillary, subclavian, internal jugular, and brachiocephalic veins all appear patent. The brachial artery bifurcates 1 cm distal to the antecubital fossa with a diameter of 4.9 mm and triphasic waveforms. The patent Left radial artery measures 3.2 mm.  Assessment/Plan:  55 y.o. male with persistent volume overload relieved with hemodialysis via functional Right IJ tunneled hemodialysis catheter, complicated by pertinent comorbidities including HTN, muscular dystrophy, and tobacco abuse.         - results of LUE vein mapping duplex discussed with patient       - all risks, benefits, and alternatives to creation of AVF discussed with patient       - will plan for outpatient elective creation of LUE radial-cephalic AVF       - surgical follow-up 2 weeks after planned surgery       - importance of smoking cessation reiterated  All of the above findings and recommendations were discussed with the patient and his family, and all of his and family's questions  were answered to their expressed satisfaction.  -- [name], MD, [place]: [place] [facility] Office: [phone]

## 2015-12-26 ENCOUNTER — Ambulatory Visit (HOSPITAL_COMMUNITY)
Admission: RE | Admit: 2015-12-26 | Discharge: 2015-12-26 | Disposition: A | Discharge status: Home / Self Care | Payer: BLUE CROSS/BLUE SHIELD | Source: Ambulatory Visit | Attending: Surgery | Admitting: Surgery

## 2015-12-26 ENCOUNTER — Encounter (HOSPITAL_COMMUNITY)
Admission: RE | Disposition: A | Discharge status: Home / Self Care | Payer: Self-pay | Source: Ambulatory Visit | Attending: Surgery

## 2015-12-26 ENCOUNTER — Ambulatory Visit (HOSPITAL_COMMUNITY): Payer: BLUE CROSS/BLUE SHIELD | Admitting: Anesthesiology

## 2015-12-26 DIAGNOSIS — G710 Muscular dystrophy: Secondary | ICD-10-CM | POA: Diagnosis not present

## 2015-12-26 DIAGNOSIS — I509 Heart failure, unspecified: Secondary | ICD-10-CM | POA: Insufficient documentation

## 2015-12-26 DIAGNOSIS — N186 End stage renal disease: Secondary | ICD-10-CM | POA: Diagnosis not present

## 2015-12-26 DIAGNOSIS — I132 Hypertensive heart and chronic kidney disease with heart failure and with stage 5 chronic kidney disease, or end stage renal disease: Secondary | ICD-10-CM | POA: Insufficient documentation

## 2015-12-26 DIAGNOSIS — Z86711 Personal history of pulmonary embolism: Secondary | ICD-10-CM | POA: Insufficient documentation

## 2015-12-26 DIAGNOSIS — Z79899 Other long term (current) drug therapy: Secondary | ICD-10-CM | POA: Diagnosis not present

## 2015-12-26 DIAGNOSIS — F17210 Nicotine dependence, cigarettes, uncomplicated: Secondary | ICD-10-CM | POA: Insufficient documentation

## 2015-12-26 HISTORY — PX: AV FISTULA PLACEMENT: SHX1204

## 2015-12-26 LAB — GLUCOSE, CAPILLARY
GLUCOSE-CAPILLARY: 95 mg/dL (ref 65–99)
Glucose-Capillary: 77 mg/dL (ref 65–99)
Glucose-Capillary: 85 mg/dL (ref 65–99)

## 2015-12-26 SURGERY — ARTERIOVENOUS (AV) FISTULA CREATION
Anesthesia: Monitor Anesthesia Care | Site: Arm Upper | Laterality: Left

## 2015-12-26 MED ORDER — MIDAZOLAM HCL 5 MG/5ML IJ SOLN
INTRAMUSCULAR | Status: DC | PRN
  Administered 2015-12-26 (×2): 1 mg via INTRAVENOUS

## 2015-12-26 MED ORDER — HEPARIN SODIUM (PORCINE) 1000 UNIT/ML IJ SOLN
INTRAMUSCULAR | Status: DC | PRN
  Administered 2015-12-26: 3000 [IU] via INTRAVENOUS

## 2015-12-26 MED ORDER — HYDROMORPHONE HCL 1 MG/ML IJ SOLN: 0.2500 mg | INTRAMUSCULAR | Status: DC | PRN

## 2015-12-26 MED ORDER — PROPOFOL 10 MG/ML IV BOLUS
INTRAVENOUS | Status: AC
  Filled 2015-12-26: qty 20

## 2015-12-26 MED ORDER — CEFAZOLIN SODIUM-DEXTROSE 2-4 GM/100ML-% IV SOLN
INTRAVENOUS | Status: AC
  Filled 2015-12-26: qty 100

## 2015-12-26 MED ORDER — HYDRALAZINE HCL 20 MG/ML IJ SOLN: 5.0000 mg | Freq: Once | INTRAMUSCULAR | Status: DC

## 2015-12-26 MED ORDER — DEXTROSE 50 % IV SOLN
INTRAVENOUS | Status: AC
  Filled 2015-12-26: qty 50

## 2015-12-26 MED ORDER — SODIUM CHLORIDE 0.9 % IV SOLN
INTRAVENOUS | Status: DC | PRN
  Administered 2015-12-26: 8 mL

## 2015-12-26 MED ORDER — PROPOFOL 500 MG/50ML IV EMUL
INTRAVENOUS | Status: DC | PRN
  Administered 2015-12-26 (×3): via INTRAVENOUS
  Administered 2015-12-26: 50 ug/kg/min via INTRAVENOUS

## 2015-12-26 MED ORDER — SODIUM CHLORIDE 0.9 % IV SOLN
Freq: Once | INTRAVENOUS | Status: DC
  Filled 2015-12-26: qty 1.2

## 2015-12-26 MED ORDER — FENTANYL CITRATE (PF) 100 MCG/2ML IJ SOLN
INTRAMUSCULAR | Status: AC
  Filled 2015-12-26: qty 2

## 2015-12-26 MED ORDER — BUPIVACAINE HCL 0.5 % IJ SOLN
INTRAMUSCULAR | Status: DC | PRN
  Administered 2015-12-26: 10 mL

## 2015-12-26 MED ORDER — SODIUM CHLORIDE 0.9 % IV SOLN
INTRAVENOUS | Status: DC | PRN
  Administered 2015-12-26: 07:00:00 via INTRAVENOUS

## 2015-12-26 MED ORDER — CEFAZOLIN SODIUM-DEXTROSE 2-4 GM/100ML-% IV SOLN
2.0000 g | INTRAVENOUS | Status: AC
  Administered 2015-12-26: 2 g via INTRAVENOUS

## 2015-12-26 MED ORDER — LACTATED RINGERS IV SOLN: INTRAVENOUS | Status: DC

## 2015-12-26 MED ORDER — BUPIVACAINE HCL (PF) 0.5 % IJ SOLN
INTRAMUSCULAR | Status: AC
  Filled 2015-12-26: qty 30

## 2015-12-26 MED ORDER — CHLORHEXIDINE GLUCONATE CLOTH 2 % EX PADS: 6.0000 | MEDICATED_PAD | Freq: Once | CUTANEOUS | Status: DC

## 2015-12-26 MED ORDER — FENTANYL CITRATE (PF) 100 MCG/2ML IJ SOLN
25.0000 ug | INTRAMUSCULAR | Status: DC | PRN
Start: 2015-12-26 — End: 2015-12-26
  Administered 2015-12-26: 25 ug via INTRAVENOUS

## 2015-12-26 MED ORDER — HYDRALAZINE HCL 20 MG/ML IJ SOLN
INTRAMUSCULAR | Status: AC
  Filled 2015-12-26: qty 1

## 2015-12-26 MED ORDER — DEXTROSE 50 % IV SOLN
12.5000 g | Freq: Once | INTRAVENOUS | Status: AC
  Administered 2015-12-26: 12.5 g via INTRAVENOUS

## 2015-12-26 MED ORDER — MIDAZOLAM HCL 2 MG/2ML IJ SOLN
1.0000 mg | INTRAMUSCULAR | Status: DC | PRN
  Administered 2015-12-26: 2 mg via INTRAVENOUS

## 2015-12-26 MED ORDER — ONDANSETRON HCL 4 MG/2ML IJ SOLN
4.0000 mg | Freq: Once | INTRAMUSCULAR | Status: AC
  Administered 2015-12-26: 4 mg via INTRAVENOUS

## 2015-12-26 MED ORDER — MIDAZOLAM HCL 2 MG/2ML IJ SOLN
INTRAMUSCULAR | Status: AC
  Filled 2015-12-26: qty 2

## 2015-12-26 MED ORDER — LIDOCAINE HCL (PF) 1 % IJ SOLN
INTRAMUSCULAR | Status: AC
Start: 2015-12-26 — End: 2015-12-26
  Filled 2015-12-26: qty 30

## 2015-12-26 MED ORDER — ONDANSETRON HCL 4 MG/2ML IJ SOLN
INTRAMUSCULAR | Status: AC
  Filled 2015-12-26: qty 2

## 2015-12-26 MED ORDER — HEPARIN SODIUM (PORCINE) 1000 UNIT/ML IJ SOLN
INTRAMUSCULAR | Status: AC
  Filled 2015-12-26: qty 3

## 2015-12-26 SURGICAL SUPPLY — 41 items
ADH SKN CLS APL DERMABOND .7 (GAUZE/BANDAGES/DRESSINGS) ×1
BAG HAMPER (MISCELLANEOUS) ×2 IMPLANT
CATH EMB 2FR 60CM (CATHETERS) IMPLANT
CATH EMB 3FR 40CM (CATHETERS) IMPLANT
CATH EMB 4FR 40CM (CATHETERS) IMPLANT
CHLORAPREP W/TINT 26ML (MISCELLANEOUS) ×4 IMPLANT
CLIP TI MEDIUM 6 (CLIP) IMPLANT
CLIP TI WIDE RED SMALL 6 (CLIP) ×1 IMPLANT
CLOTH BEACON ORANGE TIMEOUT ST (SAFETY) ×2 IMPLANT
COVER LIGHT HANDLE STERIS (MISCELLANEOUS) ×4 IMPLANT
DECANTER SPIKE VIAL GLASS SM (MISCELLANEOUS) ×4 IMPLANT
DERMABOND ADVANCED (GAUZE/BANDAGES/DRESSINGS) ×1
DERMABOND ADVANCED .7 DNX12 (GAUZE/BANDAGES/DRESSINGS) ×1 IMPLANT
DRAPE ORTHO 2.5IN SPLIT 77X108 (DRAPES) ×1 IMPLANT
DRAPE ORTHO SPLIT 77X108 STRL (DRAPES) ×2
DRAPE PROXIMA HALF (DRAPES) ×2 IMPLANT
GLOVE BIOGEL PI IND STRL 7.0 (GLOVE) ×1 IMPLANT
GLOVE BIOGEL PI IND STRL 7.5 (GLOVE) ×1 IMPLANT
GLOVE BIOGEL PI INDICATOR 7.0 (GLOVE) ×2
GLOVE BIOGEL PI INDICATOR 7.5 (GLOVE) ×1
GLOVE ECLIPSE 6.5 STRL STRAW (GLOVE) ×3 IMPLANT
GLOVE ECLIPSE 7.0 STRL STRAW (GLOVE) ×2 IMPLANT
GLOVE EXAM NITRILE PF MED BLUE (GLOVE) ×1 IMPLANT
GOWN STRL REUS W/TWL LRG LVL3 (GOWN DISPOSABLE) ×4 IMPLANT
HEMOSTAT SURGICEL 4X8 (HEMOSTASIS) IMPLANT
IV CATH 18G SAFETY (IV SOLUTION) ×2 IMPLANT
KIT BLADEGUARD II DBL (SET/KITS/TRAYS/PACK) ×2 IMPLANT
KIT ROOM TURNOVER APOR (KITS) ×2 IMPLANT
MANIFOLD NEPTUNE II (INSTRUMENTS) ×2 IMPLANT
MARKER SKIN DUAL TIP RULER LAB (MISCELLANEOUS) ×4 IMPLANT
PACK CV ACCESS (CUSTOM PROCEDURE TRAY) ×2 IMPLANT
PAD ARMBOARD 7.5X6 YLW CONV (MISCELLANEOUS) ×2 IMPLANT
SET BASIN LINEN APH (SET/KITS/TRAYS/PACK) ×2 IMPLANT
SPONGE SURGIFOAM ABS GEL 100 (HEMOSTASIS) IMPLANT
SUT PROLENE 6 0 C 1 30 (SUTURE) ×1 IMPLANT
SUT PROLENE 7 0 BV 1 (SUTURE) IMPLANT
SUT VIC AB 3-0 SH 27 (SUTURE) ×2
SUT VIC AB 3-0 SH 27X BRD (SUTURE) ×1 IMPLANT
SUT VIC AB 4-0 PS2 27 (SUTURE) ×2 IMPLANT
VESSEL LOOPS MAXI RED (MISCELLANEOUS) ×1 IMPLANT
VESSEL LOOPS MINI BLUE (MISCELLANEOUS) ×2 IMPLANT

## 2015-12-26 NOTE — Discharge Instructions (Addendum)
In addition to included general post-operative instructions for Creation of an Arterio-Venous Fistula for hemodialysis access,  Diet: Resume home heart healthy Renal diet.   Activity: No heavy lifting (children, pets, laundry) or strenuous activity until follow-up, but light activity and walking are encouraged. Do not drive or drink alcohol if pain or taking narcotic pain medications.   Wound care: 2 days after surgery (Friday, 8/18), may get incision wet with soapy water and pat dry (do not rub incisions), but no baths or submerging incision underwater until follow-up. Still must keep Right tunneled IJ hemodialysis catheter site clean and dry.  Medications: Resume all home medications. For mild to moderate pain: acetaminophen (Tylenol). Narcotic pain medications, if prescribed, can be used for severe pain, though may cause nausea, constipation, and drowsiness. Do not combine Tylenol and Percocet within a 6 hour period as Percocet contains Tylenol. If you do not need the narcotic pain medication, you do not need to fill the prescription.  Call office [phone]) at any time if any questions, worsening pain, fevers/chills, bleeding, drainage from incision site, or other concerns.  Call office to make an appointment for 2 weeks follow up. [phone]  PATIENT INSTRUCTIONS POST-ANESTHESIA  IMMEDIATELY FOLLOWING SURGERY:  Do not drive or operate machinery for the first twenty four hours after surgery.  Do not make any important decisions for twenty four hours after surgery or while taking narcotic pain medications or sedatives.  If you develop intractable nausea and vomiting or a severe headache please notify your doctor immediately.  FOLLOW-UP:  Please make an appointment with your surgeon as instructed. You do not need to follow up with anesthesia unless specifically instructed to do so.  WOUND CARE INSTRUCTIONS (if applicable):  Keep a dry clean dressing on the anesthesia/puncture wound site  if there is drainage.  Once the wound has quit draining you may leave it open to air.  Generally you should leave the bandage intact for twenty four hours unless there is drainage.  If the epidural site drains for more than 36-48 hours please call the anesthesia department.  QUESTIONS?:  Please feel free to call your physician or the hospital operator if you have any questions, and they will be happy to assist you.

## 2015-12-26 NOTE — Anesthesia Preprocedure Evaluation (Signed)
Anesthesia Evaluation  Patient identified by MRN, date of birth, ID band Patient awake    Reviewed: Allergy & Precautions, NPO status , Patient's Chart, lab work & pertinent test results  History of Anesthesia Complications (+) PROLONGED EMERGENCE  Airway Mallampati: II  TM Distance: >3 FB     Dental  (+) Partial Upper   Pulmonary shortness of breath, Current Smoker,    breath sounds clear to auscultation       Cardiovascular hypertension, Pt. on home beta blockers and Pt. on medications +CHF   Rhythm:Regular Rate:Normal     Neuro/Psych  Neuromuscular disease ( Limb-girdle muscular dystrophy )    GI/Hepatic negative GI ROS,   Endo/Other    Renal/GU ARF and CRFRenal disease     Musculoskeletal   Abdominal   Peds  Hematology  (+) anemia ,   Anesthesia Other Findings   Reproductive/Obstetrics                             Anesthesia Physical Anesthesia Plan  ASA: III  Anesthesia Plan: MAC   Post-op Pain Management:    Induction: Intravenous  Airway Management Planned: Simple Face Mask  Additional Equipment:   Intra-op Plan:   Post-operative Plan:   Informed Consent: I have reviewed the patients History and Physical, chart, labs and discussed the procedure including the risks, benefits and alternatives for the proposed anesthesia with the patient or authorized representative who has indicated his/her understanding and acceptance.     Plan Discussed with:   Anesthesia Plan Comments:         Anesthesia Quick Evaluation

## 2015-12-26 NOTE — Interval H&P Note (Signed)
History and Physical Interval Note:  12/26/2015 7:23 AM  Steven Sellers  has presented today for surgery, with the diagnosis of end stage renal disease  The various methods of treatment have been discussed with the patient and family. After consideration of risks, benefits and other options for treatment, the patient has consented to  Procedure(s): ARTERIOVENOUS (AV) FISTULA CREATION LEFT UPPER EXTREMITY (Left) as a surgical intervention .  The patient's history has been reviewed, patient examined, no change in status, stable for surgery.  I have reviewed the patient's chart and labs.  Questions were answered to the patient's satisfaction.     Vickie Epley

## 2015-12-26 NOTE — Anesthesia Postprocedure Evaluation (Signed)
Anesthesia Post Note  Patient: Steven Sellers  Procedure(s) Performed: Procedure(s) (LRB): CREATION OF LEFT RADIAL-CEPHALIC ARTERIOVENOUS FISTULA FOR HEMODIALYSIS (Left)  Patient location during evaluation: PACU Anesthesia Type: MAC Level of consciousness: awake, oriented and patient cooperative Pain management: pain level controlled Vital Signs Assessment: post-procedure vital signs reviewed and stable Respiratory status: spontaneous breathing, nonlabored ventilation, respiratory function stable and patient connected to face mask oxygen Cardiovascular status: blood pressure returned to baseline and stable Postop Assessment: no signs of nausea or vomiting Anesthetic complications: no    Last Vitals:  Vitals:   12/26/15 0730 12/26/15 0735  BP: (!) 191/109 (!) 172/89  Resp: 13 12  Temp:      Last Pain:  Vitals:   12/26/15 0704  TempSrc: Oral                 Syan Cullimore J

## 2015-12-26 NOTE — Op Note (Signed)
SURGICAL OPERATIVE REPORT   DATE OF PROCEDURE: 12/26/2015   ATTENDING SURGEON: Corene Cornea E. Rosana Hoes, MD   ANESTHESIA: Local with light IV sedation  PRE-OPERATIVE DIAGNOSIS: End-stage renal disease requiring durable hemodialysis access   POST-OPERATIVE DIAGNOSIS: End-stage renal disease requiring durable hemodialysis access   PROCEDURE(S): Creation of Left upper extremity radial-cephalic AV fistula at the wrist  INTRAOPERATIVE FINDINGS: Somewhat small radial artery and cephalic vein suitable for creation of AV fistula for autogenous hemodialysis access, few small branches off cephalic vein including one 3-4 cm from the anastomosis towards the patient's elbow/shoulder  INTRAOPERATIVE FLUIDS: 225 mL crystalloid   ESTIMATED BLOOD LOSS: Minimal (<30 mL)   URINE OUTPUT: No foley   SPECIMENS: None   IMPLANTS: None   DRAINS: None   COMPLICATIONS: None apparent   CONDITION AT COMPLETION: Hemodynamically stable awake   PULSE/DOPPLER EXAM AT END OF PROCEDURE:   (p=palpable; d=doppler signals; 0=none)     Right   Left   AVF    easily palpable non-pulsatile thrill   Brach  p   p   Rad  p   p   DISPOSITION: PACU   INDICATION(S) FOR PROCEDURE:  Patient is a Right-handed 55 y.o. male with end-stage renal disease currently undergoing hemodialysis via a Right IJ tunneled catheter requiring durable hemodialysis access. Ultrasound vein mapping suggested Left forearm cephalic vein suitable for creation of an autogenous AV fistula. Duplex vein mapping was repeated upon administration of anesthesia with essentially the same findings. All risks, benefits, and alternatives to above elective procedures were discussed with the patient, who elected to proceed, and informed consent was accordingly obtained at that time.   DETAILS OF PROCEDURE:  Patient was brought to the operating suite and appropriately identified. Light IV sedation was administered along with peri-operative prophylactic IV  antibiotics. In supine position, operative site was prepped and draped in usual sterile fashion, and following a brief time out, initial 2 - 3 cm longitudinal skin incision was made between the radial artery and cephalic vein at the patient's wrist using a #15 blade scalpel and extended deep through subcutaneous tissues and fascia using electrocaudery and sharp dissection, which were used to circumferentially dissect the visualized cephalic vein free from surrounding tissues. Attention was then directed to the radial artery, over which fascia was incised, and the artery was exposed and circumfererntially dissected free of surrounding tissue. Anterior vein surface was marked, and two branches of the distally exposed vein were individually ligated with 3-0 silk suture ties and divided, after which the proximal vein was confirmed to flush easily with a palpable non-pulsatile thrill during flush and lay adjacent to the artery without twisting or kinks. The vein wall between the two distal venous branches was cut using Potts scissors to create a combined hood for the anastomosis.  3000 Units of heparin were administered intravenously by anesthesiologist, and silastic vessel loops along with atraumatic vascular clamps were used to secure proximal and distal control of the artery, and an approximately 6 mm arteriotomy was created. Distal end of the vein was spatulated, and a running continuous anastomosis was performed between artery and vein using 6-0 Prolene suture from the apex and base of the arteriotomy. Prior to completion of anastomosis, outflow was backward bled and inflow was forward bled, anastomosis was flushed with heparinized saline, and anastomotic sutures were tied. Hemostasis was achieved with topical hemostatic agents as needed, and distal pulses and a thrill over the AV fistula outflow were confirmed. All instrument and sponge counts were confirmed,  and incisions were reapproximated in layers with 3-0  Vicryl suture for soft tissue, followed by running subcuticular 4-0 Vicryl suture for skin. Skin was cleaned, dried, and skin glue was applied. Patient was then safely able to be awakened and transferred to PACU for post-operative monitoring and care.   I was present for all aspects of procedure, and there were no intra-operative complications apparent.

## 2015-12-26 NOTE — Transfer of Care (Signed)
Immediate Anesthesia Transfer of Care Note  Patient: Steven Sellers  Procedure(s) Performed: Procedure(s): CREATION OF LEFT RADIAL-CEPHALIC ARTERIOVENOUS FISTULA FOR HEMODIALYSIS (Left)  Patient Location: PACU  Anesthesia Type:MAC  Level of Consciousness: awake and patient cooperative  Airway & Oxygen Therapy: Patient Spontanous Breathing and Patient connected to face mask oxygen  Post-op Assessment: Report given to RN, Post -op Vital signs reviewed and stable and Patient moving all extremities  Post vital signs: Reviewed and stable  Last Vitals:  Vitals:   12/26/15 0730 12/26/15 0735  BP: (!) 191/109 (!) 172/89  Resp: 13 12  Temp:      Last Pain:  Vitals:   12/26/15 0704  TempSrc: Oral      Patients Stated Pain Goal: 6 ([id number])  Complications: No apparent anesthesia complications

## 2015-12-31 ENCOUNTER — Encounter (HOSPITAL_COMMUNITY): Payer: Self-pay | Admitting: Surgery

## 2016-01-19 ENCOUNTER — Emergency Department (HOSPITAL_COMMUNITY): Payer: BLUE CROSS/BLUE SHIELD

## 2016-01-19 ENCOUNTER — Emergency Department (HOSPITAL_COMMUNITY)
Admission: EM | Admit: 2016-01-19 | Discharge: 2016-01-19 | Disposition: A | Discharge status: Home / Self Care | Payer: BLUE CROSS/BLUE SHIELD | Attending: Emergency Medicine | Admitting: Emergency Medicine

## 2016-01-19 ENCOUNTER — Encounter (HOSPITAL_COMMUNITY): Payer: Self-pay

## 2016-01-19 DIAGNOSIS — F17210 Nicotine dependence, cigarettes, uncomplicated: Secondary | ICD-10-CM | POA: Diagnosis not present

## 2016-01-19 DIAGNOSIS — I509 Heart failure, unspecified: Secondary | ICD-10-CM | POA: Insufficient documentation

## 2016-01-19 DIAGNOSIS — S90811A Abrasion, right foot, initial encounter: Secondary | ICD-10-CM

## 2016-01-19 DIAGNOSIS — W01198A Fall on same level from slipping, tripping and stumbling with subsequent striking against other object, initial encounter: Secondary | ICD-10-CM | POA: Insufficient documentation

## 2016-01-19 DIAGNOSIS — W19XXXA Unspecified fall, initial encounter: Secondary | ICD-10-CM

## 2016-01-19 DIAGNOSIS — S80212A Abrasion, left knee, initial encounter: Secondary | ICD-10-CM | POA: Diagnosis not present

## 2016-01-19 DIAGNOSIS — S80211A Abrasion, right knee, initial encounter: Secondary | ICD-10-CM | POA: Diagnosis not present

## 2016-01-19 DIAGNOSIS — S01111A Laceration without foreign body of right eyelid and periocular area, initial encounter: Secondary | ICD-10-CM | POA: Diagnosis not present

## 2016-01-19 DIAGNOSIS — I132 Hypertensive heart and chronic kidney disease with heart failure and with stage 5 chronic kidney disease, or end stage renal disease: Secondary | ICD-10-CM | POA: Insufficient documentation

## 2016-01-19 DIAGNOSIS — Z79899 Other long term (current) drug therapy: Secondary | ICD-10-CM | POA: Diagnosis not present

## 2016-01-19 DIAGNOSIS — S0990XA Unspecified injury of head, initial encounter: Secondary | ICD-10-CM | POA: Diagnosis present

## 2016-01-19 DIAGNOSIS — Y999 Unspecified external cause status: Secondary | ICD-10-CM | POA: Insufficient documentation

## 2016-01-19 DIAGNOSIS — Y929 Unspecified place or not applicable: Secondary | ICD-10-CM | POA: Insufficient documentation

## 2016-01-19 DIAGNOSIS — Y92009 Unspecified place in unspecified non-institutional (private) residence as the place of occurrence of the external cause: Secondary | ICD-10-CM

## 2016-01-19 DIAGNOSIS — N186 End stage renal disease: Secondary | ICD-10-CM | POA: Diagnosis not present

## 2016-01-19 DIAGNOSIS — Y9301 Activity, walking, marching and hiking: Secondary | ICD-10-CM | POA: Diagnosis not present

## 2016-01-19 DIAGNOSIS — S0083XA Contusion of other part of head, initial encounter: Secondary | ICD-10-CM

## 2016-01-19 MED ORDER — LIDOCAINE-EPINEPHRINE-TETRACAINE (LET) SOLUTION
3.0000 mL | Freq: Once | NASAL | Status: AC
  Administered 2016-01-19: 3 mL via TOPICAL
  Filled 2016-01-19: qty 3

## 2016-01-19 MED ORDER — BUPIVACAINE HCL (PF) 0.5 % IJ SOLN
10.0000 mL | Freq: Once | INTRAMUSCULAR | Status: AC
  Administered 2016-01-19: 10 mL
  Filled 2016-01-19: qty 30

## 2016-01-19 NOTE — ED Provider Notes (Signed)
[facility] DEPT Provider Note   CSN: [id number] Arrival date & time: 01/19/16  0106  Time seen 02:15 AM   History   Chief Complaint Chief Complaint  Patient presents with  . Fall    HPI Steven Sellers is a 55 y.o. male.  HPI patient is a dialysis patient and gets dialysis on Tuesday, Thursday, and Saturday (later this morning), he goes to [facility]. He states about 12:30 this morning he was walking out on his porch and he tripped because his flip-flops were too loose and fell hitting his head on the porch swing. He denies loss of consciousness. He complains of swelling to the right side of his head, laceration of his right upper eyelid, abrasions to his knees, elbows and the toes of his right foot. However he states the areas that have abrasions are not painful. He denies nausea, vomiting, blurred vision, or neck pain. He states he crawled into the house and then his wife helped him get up. He states his immunizations are up-to-date.    PCP Dr Hilma Favors Nephrology Dr Lowanda Foster  Past Medical History:  Diagnosis Date  . Acute renal failure (ARF) (HCC)    Several episodes of HD in 2012  . Alcohol use ([place])   . Chronic kidney disease   . Hypertension   . Muscular dystrophy ([place])   . Pulmonary embolism ([place]) 2011  . Tobacco use   . Vision abnormalities     Patient Active Problem List   Diagnosis Date Noted  . CHF (congestive heart failure) ([place]) 12/16/2015  . CKD (chronic kidney disease) requiring chronic dialysis ([place]) 12/16/2015  . SOB (shortness of breath) 12/16/2015  . Central venous catheter in place   . Hyponatremia 11/30/2015  . Anemia 11/30/2015  . ESRD needing dialysis ([place]) 11/30/2015  . Acute renal failure (ARF) ([place]) 11/30/2015  . Hyperkalemia 05/12/2015  . CKD (chronic kidney disease) stage 3, GFR 30-59 ml/min 05/12/2015  . 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 Muscle atrophy 04/25/2015  . Arm weakness 04/25/2015  . Leg weakness 04/25/2015  .  Dysesthesia 04/25/2015  . Essential hypertension 05/21/2010  . PULMONARY EMBOLISM 05/21/2010  . PLEURAL EFFUSION 05/21/2010  . RENAL FAILURE, ACUTE 05/21/2010    Past Surgical History:  Procedure Laterality Date  . AV FISTULA PLACEMENT Left 12/26/2015   Procedure: CREATION OF LEFT RADIAL-CEPHALIC ARTERIOVENOUS FISTULA FOR HEMODIALYSIS;  Surgeon: Vickie Epley, MD;  Location: [facility];  Service: Vascular;  Laterality: Left;  . CARDIAC CATHETERIZATION  2002   normal coronary arteries  . CENTRAL VENOUS CATHETER INSERTION Right 12/04/2015   Procedure: INSERTION OF TUNNELED HEMODIALYSIS CATHETER RIGHT INTERNAL JUGULAR;  Surgeon: Vickie Epley, MD;  Location: [facility];  Service: General;  Laterality: Right;       Home Medications    Prior to Admission medications   Medication Sig Start Date End Date Taking? Authorizing Provider  acetaminophen (TYLENOL 8 HOUR) 650 MG CR tablet Take 650 mg by mouth every 8 (eight) hours as needed for pain.    Historical Provider, MD  diltiazem (TIAZAC) 180 MG 24 hr capsule Take 180 mg by mouth daily.  11/28/15   Historical Provider, MD  furosemide (LASIX) 40 MG tablet Take 40 mg by mouth 2 (two) times daily. 12/11/15   Historical Provider, MD  hydrALAZINE (APRESOLINE) 50 MG tablet Take 1 tablet (50 mg total) by mouth every 8 (eight) hours. 12/05/15   Theodis Blaze, MD  metolazone (ZAROXOLYN) 5 MG tablet Take 5 mg by  mouth 2 (two) times daily. 12/07/15   Historical Provider, MD  metoprolol (LOPRESSOR) 50 MG tablet Take 50 mg by mouth 2 (two) times daily.    Historical Provider, MD  sevelamer carbonate (RENVELA) 800 MG tablet Take 4 tablets (3,200 mg total) by mouth 3 (three) times daily with meals. 12/05/15   Theodis Blaze, MD    Family History Family History  Problem Relation Age of Onset  . Hypertension Mother   . Heart attack Father     Social History Social History  Substance Use Topics  . Smoking status: Current Every Day Smoker    Packs/day: 1.00      Types: Cigarettes    Start date: 05/12/1974  . Smokeless tobacco: Never Used  . Alcohol use 14.4 oz/week    24 Standard drinks or equivalent per week     Comment: occ  lives at home Lives with spouse On disabililty   Allergies   Other   Review of Systems Review of Systems  All other systems reviewed and are negative.    Physical Exam Updated Vital Signs BP 159/93 (BP Location: Right Arm)   Pulse (!) 58   Temp 97.7 F (36.5 C) (Oral)   Resp 18   Ht 6\' 2"  (1.88 m)   Wt 173 lb (78.5 kg)   SpO2 97%   BMI 22.21 kg/m   Physical Exam  Constitutional: He is oriented to person, place, and time. He appears well-developed and well-nourished.  Non-toxic appearance. He does not appear ill. No distress.  HENT:  Head: Normocephalic.  Right Ear: External ear normal.  Left Ear: External ear normal.  Nose: Nose normal. No mucosal edema or rhinorrhea.  Mouth/Throat: Oropharynx is clear and moist and mucous membranes are normal. No dental abscesses or uvula swelling.  Patient has moderate swelling of his right forehead and around his right eyelids. He's noted to have a 2 cm laceration of his medial right upper eyelid that is bleeding. His globe appears intact.  Eyes: Conjunctivae and EOM are normal. Pupils are equal, round, and reactive to light.  Neck: Normal range of motion and full passive range of motion without pain. Neck supple.  Cardiovascular: Normal rate, regular rhythm and normal heart sounds.  Exam reveals no gallop and no friction rub.   No murmur heard. Pulmonary/Chest: Effort normal and breath sounds normal. No respiratory distress. He has no wheezes. He has no rhonchi. He has no rales. He exhibits no tenderness and no crepitus.  Ribs are nontender to stressing  Abdominal: Soft. Normal appearance and bowel sounds are normal. He exhibits no distension. There is no tenderness. There is no rebound and no guarding.  Musculoskeletal: Normal range of motion. He exhibits no  edema or tenderness.  Moves all extremities well.   Neurological: He is alert and oriented to person, place, and time. He has normal strength. No cranial nerve deficit.  Skin: Skin is warm, dry and intact. No rash noted. No erythema. No pallor.  Patient has a small superficial abrasion near his left elbow without pain on range of motion or joint effusion.  He is noted to have abrasions on the anterior aspect of both knees however he has no pain on range of motion, no joint effusions.  He is noted to have abrasions over his distal toes on the right foot without swelling or deformity. They are not actively bleeding.  Psychiatric: He has a normal mood and affect. His speech is normal and behavior is normal. His mood  appears not anxious.  Nursing note and vitals reviewed.             ED Treatments / Results  Labs (all labs ordered are listed, but only abnormal results are displayed) Labs Reviewed - No data to display  EKG  EKG Interpretation None       Radiology Ct Head Wo Contrast  Ct Maxillofacial Wo Cm  Result Date: 01/19/2016 CLINICAL DATA:  Initial evaluation for acute trauma, fall. Hit head on porch swing. EXAM: CT HEAD WITHOUT CONTRAST CT MAXILLOFACIAL WITHOUT CONTRAST TECHNIQUE: Multidetector CT imaging of the head and maxillofacial structures were performed using the standard protocol without intravenous contrast. Multiplanar CT image reconstructions of the maxillofacial structures were also generated. COMPARISON:  None. FINDINGS: CT HEAD FINDINGS Large hematoma present at the end right forehead/periorbital region. Scalp soft tissues otherwise unremarkable. Mastoid air cells are clear. Calvarium intact. Mild age-related cerebral atrophy with chronic microvascular ischemic disease. No acute intracranial hemorrhage. No evidence for large vessel territory infarct. No mass lesion, midline shift or mass effect. No hydrocephalus. No extra-axial fluid collection. CT MAXILLOFACIAL  FINDINGS Large contusion at the right forehead/periorbital region. No other appreciable soft tissue swelling within the face. Globes intact. No retro-orbital hematoma or other pathology. Bony orbits intact without evidence orbital floor fracture. Zygomatic arches intact. No acute maxillary fracture. Pterygoid plates intact. No acute nasal bone fracture. Nasal septum mildly bowed to the right but intact. Mandible intact. Mandibular condyles normally situated. Poor dentition noted. Visualized upper cervical spine grossly intact. Paranasal sinuses are clear. IMPRESSION: 1. Large soft tissue hematoma at the right forehead and periorbital region. Intact globes with no retro-orbital pathology. 2. No acute intracranial process. 3. No acute maxillofacial fracture identified. Electronically Signed   By: Jeannine Boga M.D.   On: 01/19/2016 03:17    Procedures Procedures (including critical care time)  Medications Ordered in ED Medications  bupivacaine (MARCAINE) 0.5 % injection 10 mL (not administered)  lidocaine-EPINEPHrine-tetracaine (LET) solution (3 mLs Topical Given 01/19/16 0258)     Initial Impression / Assessment and Plan / ED Course  I have reviewed the triage vital signs and the nursing notes.  Pertinent labs & imaging results that were available during my care of the patient were reviewed by me and considered in my medical decision making (see chart for details).  Clinical Course   CT of the head and maxillofacial bones was ordered to look for fracture. I did not x-ray the other extremities that had abrasions because he had no pain with range of motion or palpation.  When I repaired his laceration I put in just enough sutures to close the wound well with good approximation and to stop the bleeding which was mainly from the most medial aspect of the laceration. We discussed wound care and that he needed to have his sutures removed in 3-5 days. He will also was given a head injury  sheet.  LACERATION REPAIR Performed by: [place] by: Janice Norrie Consent: Verbal consent obtained. Risks and benefits: risks, benefits and alternatives were discussed Consent given by: patient Patient identity confirmed: provided demographic data Prepped and Draped in normal sterile fashion Wound explored  Laceration Location: medial right upper eyelid  Laceration Length: 2 cm  No Foreign Bodies seen or palpated  Anesthesia: local infiltration and LET  Local anesthetic: 0.5% marcaine  Anesthetic total: 2 ml  Irrigation method: syringe Amount of cleaning: standard  Skin closure: 6-0 nylon  Number of sutures: 4  Technique:  simple interrupted  Patient tolerance: Patient tolerated the procedure well with no immediate complications.   Final Clinical Impressions(s) / ED Diagnoses   Final diagnoses:  Fall at home, initial encounter  Contusion of face, initial encounter  Laceration, eyelid, right, initial encounter  Abrasion, knee, right, initial encounter  Abrasion, knee, left, initial encounter  Abrasion of foot or toe, right, initial encounter   Plan discharge  Rolland Porter, MD, Barbette Or, MD 01/19/16 [phone]

## 2016-01-19 NOTE — ED Notes (Signed)
Patient verbalizes understanding of discharge instructions, home care and follow up care. Patient out of department at this time. 

## 2016-01-19 NOTE — Discharge Instructions (Signed)
Keep your wounds clean and dry. Wash them daily with soap and water. Pa dry. Use antibiotic ointment on the wounds. The sutures in your right upper eyelid need to be removed in the next 3-5 days. Use ice packs to keep the swelling down. You will get more bruising around your right eye as the swelling in your forehead improves. Return to the emergency department for any signs of infection in your wounds or if you have any problems listed on the head injury sheet. Talk to dialysis about cutting down on your heparin dose today in dialysis.

## 2016-01-19 NOTE — ED Triage Notes (Signed)
Patient states his flip flop got caught in his deck and he fell hitting his head on a swing. Patient presents with swelling, and bruising to right forehead and small abrasions noted to nose, right eye lid, right foot and knee. Patient denies LOC. N&HR1

## 2016-03-04 ENCOUNTER — Other Ambulatory Visit (HOSPITAL_COMMUNITY): Payer: Self-pay | Admitting: Surgery

## 2016-03-04 DIAGNOSIS — N289 Disorder of kidney and ureter, unspecified: Secondary | ICD-10-CM

## 2016-03-07 ENCOUNTER — Ambulatory Visit (HOSPITAL_COMMUNITY)
Admission: RE | Admit: 2016-03-07 | Discharge: 2016-03-07 | Disposition: A | Discharge status: Home / Self Care | Payer: BLUE CROSS/BLUE SHIELD | Source: Ambulatory Visit | Attending: Surgery | Admitting: Surgery

## 2016-03-07 DIAGNOSIS — N186 End stage renal disease: Secondary | ICD-10-CM | POA: Insufficient documentation

## 2016-03-07 DIAGNOSIS — N289 Disorder of kidney and ureter, unspecified: Secondary | ICD-10-CM

## 2016-04-22 ENCOUNTER — Ambulatory Visit (HOSPITAL_COMMUNITY)
Admission: RE | Admit: 2016-04-22 | Discharge: 2016-04-22 | Disposition: A | Discharge status: Home / Self Care | Payer: BLUE CROSS/BLUE SHIELD | Source: Ambulatory Visit | Attending: Surgery | Admitting: Surgery

## 2016-04-22 ENCOUNTER — Other Ambulatory Visit (HOSPITAL_COMMUNITY): Payer: Self-pay | Admitting: Surgery

## 2016-04-22 DIAGNOSIS — T829XXA Unspecified complication of cardiac and vascular prosthetic device, implant and graft, initial encounter: Secondary | ICD-10-CM | POA: Diagnosis present

## 2016-04-25 ENCOUNTER — Encounter (HOSPITAL_COMMUNITY): Payer: Self-pay

## 2016-04-25 ENCOUNTER — Encounter (HOSPITAL_COMMUNITY)
Admission: RE | Admit: 2016-04-25 | Discharge: 2016-04-25 | Disposition: A | Discharge status: Home / Self Care | Payer: BLUE CROSS/BLUE SHIELD | Source: Ambulatory Visit | Attending: Surgery | Admitting: Surgery

## 2016-04-27 NOTE — H&P (Addendum)
SURGICAL HISTORY & PHYSICAL  HISTORY OF PRESENT ILLNESS (HPI):  55 y.o. male presents for follow-up 4 months after creation of a Left radial-cephalic AV fistula. When seen 9/27, HD access center was instructed to use his AV fistula for hemodialysis, and patient was made a follow-up appointment for removal of his tunneled Right IJ hemodialysis catheter. Since that time, his fistula has not been successfully accessed, and his follow-up appointments were all cancelled. AV fistula duplex 10/27 demonstrated a patent LUE AV fistula. Patient presents today for reassessment of AV fistula. Patient denies LUE peri-incisional or finger tip pain, fever/chills, CP, or SOB.  PAST MEDICAL HISTORY (PMH):  Past Medical History:  Diagnosis Date  . Acute renal failure (ARF) (HCC)    Several episodes of HD in 2012  . Alcohol use   . Chronic kidney disease   . Hypertension   . Muscular dystrophy ([place])   . Pulmonary embolism ([place]) 2011  . Tobacco use   . Vision abnormalities     Reviewed. Otherwise negative.   PAST SURGICAL HISTORY ([place]):  Past Surgical History:  Procedure Laterality Date  . AV FISTULA PLACEMENT Left 12/26/2015   Procedure: CREATION OF LEFT RADIAL-CEPHALIC ARTERIOVENOUS FISTULA FOR HEMODIALYSIS;  Surgeon: Vickie Epley, MD;  Location: [facility];  Service: Vascular;  Laterality: Left;  . CARDIAC CATHETERIZATION  2002   normal coronary arteries  . CENTRAL VENOUS CATHETER INSERTION Right 12/04/2015   Procedure: INSERTION OF TUNNELED HEMODIALYSIS CATHETER RIGHT INTERNAL JUGULAR;  Surgeon: Vickie Epley, MD;  Location: [facility];  Service: General;  Laterality: Right;    Reviewed. Otherwise negative.   MEDICATIONS:  Prior to Admission medications   Medication Sig Start Date End Date Taking? Authorizing Provider  acetaminophen (TYLENOL) 500 MG tablet Take 1,000 mg by mouth every 6 (six) hours as needed for mild pain or moderate pain.   Yes Historical Provider, MD  diltiazem (TIAZAC) 180 MG  24 hr capsule Take 180 mg by mouth daily.  11/28/15  Yes Historical Provider, MD  furosemide (LASIX) 20 MG tablet Take 40 mg by mouth every evening.   Yes Historical Provider, MD  metoprolol (LOPRESSOR) 50 MG tablet Take 25 mg by mouth 2 (two) times daily.    Yes Historical Provider, MD  sevelamer carbonate (RENVELA) 2.4 g PACK Take 2.4 g by mouth See admin instructions. Takes only if eating Dairy products (1 pack per 2 oz of water) 04/04/16  Yes Historical Provider, MD  zolpidem (AMBIEN) 10 MG tablet Take 1 tablet by mouth at bedtime. 03/23/16  Yes Historical Provider, MD     ALLERGIES:  Allergies  Allergen Reactions  . Other Other (See Comments)    IV contrast- Renal issues     SOCIAL HISTORY:  Social History   Social History  . Marital status: Married    Spouse name: N/A  . Number of children: N/A  . Years of education: N/A   Occupational History  . Not on file.   Social History Main Topics  . Smoking status: Current Every Day Smoker    Packs/day: 1.00    Types: Cigarettes    Start date: 05/12/1974  . Smokeless tobacco: Never Used  . Alcohol use 14.4 oz/week    24 Standard drinks or equivalent per week     Comment: occ  . Drug use: No  . Sexual activity: Not on file   Other Topics Concern  . Not on file   Social History Narrative  . No narrative on file  The patient currently resides (home / rehab facility / nursing home): Home  The patient normally is (ambulatory / bedbound) : Ambulatory   FAMILY HISTORY:  Family History  Problem Relation Age of Onset  . Hypertension Mother   . Heart attack Father    Otherwise negative.   REVIEW OF SYSTEMS:  Constitutional: denies any other weight loss, fever, chills, or sweats  Eyes: denies any other vision changes, history of eye injury  ENT: denies sore throat, hearing problems  Respiratory: denies shortness of breath, wheezing  Cardiovascular: denies chest pain, palpitations  Gastrointestinal: denies abdominal pain,  N/V, or diarrhea  Genitourinary: denies burning with urination or urinary frequency Musculoskeletal: denies any other joint pains or cramps Skin: Denies any other rashes or skin discolorations  Neurological: denies any other headache, dizziness, weakness  Psychiatric: denies any other depression, anxiety   All other review of systems were otherwise negative.  VITAL SIGNS as of 91/63/8466:  Systolic 599: Diastolic 94: Heart Rate 64: Temp 36.39C (Temporal) Height 188CM: Weight 72.12KG:  BMI 20.41 kg/m2  PHYSICAL EXAM:  Constitutional:  -- Normal body habitus  -- Awake, alert, and oriented x3  Eyes:  -- Pupils equally round and reactive to light  -- No scleral icterus  Ear, nose, throat:  -- No jugular venous distension  Pulmonary:  -- No crackles  -- Equal breath sounds bilaterally -- Breathing non-labored at rest Cardiovascular:  -- S1, S2 present  -- No pericardial rubs  Gastrointestinal:  -- Abdomen soft, nontender, nondistended, no guarding/rebound  -- No abdominal masses appreciated, pulsatile or otherwise  Musculoskeletal and Integumentary:  -- Wounds or skin discoloration: Left distal forearm/wrist incision healing well, fistula outflow with easily palpable completely non-pulsatile thrill, tunneled Right IJ hemodialysis catheter well-secured without erythema or drainage -- Extremities: B/L UE and LE FROM, hands and feet warm, no edema  Neurologic:  -- Motor function: Intact and symmetric -- Sensation: Intact and symmetric  Pulse/Doppler Exam: (p=palpable; d=doppler signals; 0=none)    Right   Left   AVF  easily palpable completely non-pulsatile thrill  Brach  p   p   Rad  p   p   Imaging studies:  Left upper extremity AV fistula duplex ultrasound (04/22/2016) - personally interpreted while study was performed at [facility] Left radial-cephalic AV fistula appears patent and of adequate caliber with 3 large venous outflow branches in the forearm that may be diverting flow  from the AV fistula or which the access RN's may be attempting to cannulate   Assessment/Plan: (ICD-10's: N42.75) 55 year old Male doing well, though HD access RN's having difficulty accessing maturing Left radial-cephalic AV fistula for ESRD/hemodialysis, attributable to large forearm venous outflow side-branches, still using tunneled Right IJ HD catheter, following creation of LUE AVF and insertion of tunneled Right IJ hemodialysis catheter for dialysis.        - okay for hemodialysis RN to use Left radial-cephalic AVF for dialysis       - all risks, benefits, and alternatives to ultrasound-guided Left forearm AV fistula venous outflow branch ligation were discussed with the patient and his family, who together elect to proceed, and informed consent was accordingly obtained at that time. will plan for ultrasound-guided AVF branch ligation asap to facilitate use of LUE AVF for HD.       - will plan to remove tunneled Right IJ hemodialysis catheter after AVF used successfully for hemodialysis x3       - importance of smoking cessation reiterated and  discussed       - patient advised to call if any questions or issues  All of the above findings and recommendations were discussed with the patient and his daughter, and all of his and family's questions were answered to their expressed satisfaction.  -- [name], MD, [place]: [facility] Office: [phone]

## 2016-04-28 ENCOUNTER — Encounter (HOSPITAL_COMMUNITY): Payer: Self-pay | Admitting: *Deleted

## 2016-04-28 ENCOUNTER — Ambulatory Visit (HOSPITAL_COMMUNITY): Payer: BLUE CROSS/BLUE SHIELD | Admitting: Anesthesiology

## 2016-04-28 ENCOUNTER — Ambulatory Visit (HOSPITAL_COMMUNITY)
Admission: RE | Admit: 2016-04-28 | Discharge: 2016-04-28 | Disposition: A | Discharge status: Home / Self Care | Payer: BLUE CROSS/BLUE SHIELD | Source: Ambulatory Visit | Attending: Surgery | Admitting: Surgery

## 2016-04-28 ENCOUNTER — Encounter (HOSPITAL_COMMUNITY)
Admission: RE | Disposition: A | Discharge status: Home / Self Care | Payer: Self-pay | Source: Ambulatory Visit | Attending: Surgery

## 2016-04-28 DIAGNOSIS — G710 Muscular dystrophy: Secondary | ICD-10-CM | POA: Insufficient documentation

## 2016-04-28 DIAGNOSIS — Z86711 Personal history of pulmonary embolism: Secondary | ICD-10-CM | POA: Diagnosis not present

## 2016-04-28 DIAGNOSIS — I120 Hypertensive chronic kidney disease with stage 5 chronic kidney disease or end stage renal disease: Secondary | ICD-10-CM | POA: Diagnosis present

## 2016-04-28 DIAGNOSIS — N186 End stage renal disease: Secondary | ICD-10-CM | POA: Insufficient documentation

## 2016-04-28 DIAGNOSIS — F17210 Nicotine dependence, cigarettes, uncomplicated: Secondary | ICD-10-CM | POA: Diagnosis not present

## 2016-04-28 DIAGNOSIS — Z79899 Other long term (current) drug therapy: Secondary | ICD-10-CM | POA: Diagnosis not present

## 2016-04-28 HISTORY — PX: AV FISTULA PLACEMENT: SHX1204

## 2016-04-28 SURGERY — ARTERIOVENOUS (AV) FISTULA CREATION
Anesthesia: Monitor Anesthesia Care | Site: Arm Lower | Laterality: Left

## 2016-04-28 MED ORDER — ARTIFICIAL TEARS OP OINT
TOPICAL_OINTMENT | OPHTHALMIC | Status: AC
  Filled 2016-04-28: qty 3.5

## 2016-04-28 MED ORDER — FENTANYL CITRATE (PF) 100 MCG/2ML IJ SOLN
INTRAMUSCULAR | Status: AC
  Filled 2016-04-28: qty 2

## 2016-04-28 MED ORDER — LIDOCAINE HCL (PF) 1 % IJ SOLN
INTRAMUSCULAR | Status: AC
  Filled 2016-04-28: qty 30

## 2016-04-28 MED ORDER — SODIUM CHLORIDE 0.9 % IV SOLN
INTRAVENOUS | Status: DC | PRN
  Administered 2016-04-28: 500 mL

## 2016-04-28 MED ORDER — HYDRALAZINE HCL 20 MG/ML IJ SOLN
INTRAMUSCULAR | Status: AC
  Filled 2016-04-28: qty 1

## 2016-04-28 MED ORDER — MIDAZOLAM HCL 2 MG/2ML IJ SOLN
1.0000 mg | INTRAMUSCULAR | Status: DC | PRN
  Administered 2016-04-28: 2 mg via INTRAVENOUS

## 2016-04-28 MED ORDER — CHLORHEXIDINE GLUCONATE CLOTH 2 % EX PADS: 6.0000 | MEDICATED_PAD | Freq: Once | CUTANEOUS | Status: DC

## 2016-04-28 MED ORDER — MIDAZOLAM HCL 2 MG/2ML IJ SOLN
INTRAMUSCULAR | Status: AC
  Filled 2016-04-28: qty 2

## 2016-04-28 MED ORDER — FENTANYL CITRATE (PF) 100 MCG/2ML IJ SOLN: 25.0000 ug | INTRAMUSCULAR | Status: DC | PRN

## 2016-04-28 MED ORDER — LACTATED RINGERS IV SOLN: INTRAVENOUS | Status: DC

## 2016-04-28 MED ORDER — SODIUM CHLORIDE 0.9 % IV SOLN
Freq: Once | INTRAVENOUS | Status: DC
  Filled 2016-04-28: qty 1.2

## 2016-04-28 MED ORDER — HYDRALAZINE HCL 20 MG/ML IJ SOLN
5.0000 mg | Freq: Once | INTRAMUSCULAR | Status: AC
  Administered 2016-04-28: 5 mg via INTRAVENOUS

## 2016-04-28 MED ORDER — ONDANSETRON HCL 4 MG/2ML IJ SOLN
4.0000 mg | Freq: Once | INTRAMUSCULAR | Status: AC
  Administered 2016-04-28: 4 mg via INTRAVENOUS

## 2016-04-28 MED ORDER — CEFAZOLIN SODIUM-DEXTROSE 2-4 GM/100ML-% IV SOLN
2.0000 g | INTRAVENOUS | Status: AC
  Administered 2016-04-28: 2 g via INTRAVENOUS

## 2016-04-28 MED ORDER — SODIUM CHLORIDE 0.9 % IV SOLN
INTRAVENOUS | Status: DC
  Administered 2016-04-28: 08:00:00 via INTRAVENOUS

## 2016-04-28 MED ORDER — FENTANYL CITRATE (PF) 100 MCG/2ML IJ SOLN
25.0000 ug | INTRAMUSCULAR | Status: AC | PRN
  Administered 2016-04-28 (×2): 25 ug via INTRAVENOUS

## 2016-04-28 MED ORDER — ONDANSETRON HCL 4 MG/2ML IJ SOLN
INTRAMUSCULAR | Status: AC
  Filled 2016-04-28: qty 2

## 2016-04-28 MED ORDER — LIDOCAINE HCL 1 % IJ SOLN
INTRAMUSCULAR | Status: DC | PRN
  Administered 2016-04-28: 3 mL via INTRAMUSCULAR
  Administered 2016-04-28: 2 mL via INTRAMUSCULAR

## 2016-04-28 MED ORDER — BUPIVACAINE HCL (PF) 0.5 % IJ SOLN
INTRAMUSCULAR | Status: AC
  Filled 2016-04-28: qty 30

## 2016-04-28 MED ORDER — PROPOFOL 10 MG/ML IV BOLUS
INTRAVENOUS | Status: AC
  Filled 2016-04-28: qty 20

## 2016-04-28 MED ORDER — CEFAZOLIN SODIUM-DEXTROSE 2-4 GM/100ML-% IV SOLN
INTRAVENOUS | Status: AC
  Filled 2016-04-28: qty 100

## 2016-04-28 MED ORDER — MIDAZOLAM HCL 5 MG/5ML IJ SOLN
INTRAMUSCULAR | Status: DC | PRN
Start: 2016-04-28 — End: 2016-04-28
  Administered 2016-04-28 (×2): 1 mg via INTRAVENOUS

## 2016-04-28 MED ORDER — PROPOFOL 500 MG/50ML IV EMUL
INTRAVENOUS | Status: DC | PRN
  Administered 2016-04-28: 50 ug/kg/min via INTRAVENOUS

## 2016-04-28 SURGICAL SUPPLY — 27 items
ADH SKN CLS APL DERMABOND .7 (GAUZE/BANDAGES/DRESSINGS) ×1
BAG HAMPER (MISCELLANEOUS) ×2 IMPLANT
CHLORAPREP W/TINT 26ML (MISCELLANEOUS) ×4 IMPLANT
CLOTH BEACON ORANGE TIMEOUT ST (SAFETY) ×2 IMPLANT
COVER LIGHT HANDLE STERIS (MISCELLANEOUS) ×4 IMPLANT
DECANTER SPIKE VIAL GLASS SM (MISCELLANEOUS) ×4 IMPLANT
DERMABOND ADVANCED (GAUZE/BANDAGES/DRESSINGS) ×1
DERMABOND ADVANCED .7 DNX12 (GAUZE/BANDAGES/DRESSINGS) ×1 IMPLANT
DRAPE PROXIMA HALF (DRAPES) ×2 IMPLANT
GLOVE BIOGEL PI IND STRL 7.0 (GLOVE) ×1 IMPLANT
GLOVE BIOGEL PI IND STRL 7.5 (GLOVE) ×1 IMPLANT
GLOVE BIOGEL PI INDICATOR 7.0 (GLOVE) ×1
GLOVE BIOGEL PI INDICATOR 7.5 (GLOVE) ×1
GLOVE ECLIPSE 6.5 STRL STRAW (GLOVE) ×3 IMPLANT
GLOVE ECLIPSE 7.0 STRL STRAW (GLOVE) ×2 IMPLANT
GOWN STRL REUS W/TWL LRG LVL3 (GOWN DISPOSABLE) ×4 IMPLANT
KIT BLADEGUARD II DBL (SET/KITS/TRAYS/PACK) ×2 IMPLANT
KIT ROOM TURNOVER APOR (KITS) ×2 IMPLANT
MANIFOLD NEPTUNE II (INSTRUMENTS) ×2 IMPLANT
MARKER SKIN DUAL TIP RULER LAB (MISCELLANEOUS) ×4 IMPLANT
PACK BASIC LIMB (CUSTOM PROCEDURE TRAY) ×1 IMPLANT
PAD ARMBOARD 7.5X6 YLW CONV (MISCELLANEOUS) ×2 IMPLANT
SET BASIN LINEN APH (SET/KITS/TRAYS/PACK) ×2 IMPLANT
STOCKINETTE IMPERVIOUS LG (DRAPES) ×2 IMPLANT
SUT MNCRL AB 4-0 PS2 18 (SUTURE) ×2 IMPLANT
SUT VICRYL AB 3-0 FS1 BRD 27IN (SUTURE) ×2 IMPLANT
SYR CONTROL 10ML LL (SYRINGE) ×2 IMPLANT

## 2016-04-28 NOTE — Anesthesia Procedure Notes (Signed)
Procedure Name: MAC Date/Time: 04/28/2016 9:04 AM Performed by: Andree Elk, Olivya Sobol A Pre-anesthesia Checklist: Patient identified, Timeout performed, Emergency Drugs available, Suction available and Patient being monitored Oxygen Delivery Method: Simple face mask

## 2016-04-28 NOTE — Interval H&P Note (Signed)
History and Physical Interval Note:  04/28/2016 8:58 AM  Steven Sellers  has presented today for surgery, with the diagnosis of left forearm AV fistula disfunction  The various methods of treatment have been discussed with the patient and family. After consideration of risks, benefits and other options for treatment, the patient has consented to  Procedure(s): REVISION OF LEFT RADIAL CEPHALIC ARTERIOVENOUS (AV) FISTULA  FOR DURABLE HEMODIALYSIS (Left) as a surgical intervention .  The patient's history has been reviewed, patient examined, no change in status, stable for surgery.  I have reviewed the patient's chart and labs.  Questions were answered to the patient's satisfaction.     Vickie Epley

## 2016-04-28 NOTE — Op Note (Signed)
SURGICAL OPERATIVE REPORT   DATE OF PROCEDURE: 04/28/2016   ATTENDING SURGEON: Corene Cornea E. Rosana Hoes, MD   ANESTHESIA: Local with light IV sedation  PRE-OPERATIVE DIAGNOSIS: End-stage renal disease with difficulty accessing Left radial-cephalic AV fistula (EXH-37: T82.858S)  POST-OPERATIVE DIAGNOSIS: End-stage renal disease with difficulty accessing Left radial-cephalic AV fistula (JIR-67: T82.858S)  PROCEDURE(S): Revision of Left upper extremity radial-cephalic AV fistula including venous outflow branch ligation without thrombectomy (cpt: 89381)  INTRAOPERATIVE FINDINGS: Patent matured Left radial-cephalic AV fistula with two parallel cephalic vein outflow branches of similar sizes (the medial branch measuring slightly larger diameter along its length) and re-converging just distal to patient's antecubital fossa, several other tiny far less significant venous branches noted from both forearm cephalic vein branches  INTRAOPERATIVE FLUIDS: 200 mL crystalloid   ESTIMATED BLOOD LOSS: Minimal (<20 mL)   URINE OUTPUT: No foley   SPECIMENS: None   IMPLANTS: None   DRAINS: None   COMPLICATIONS: None apparent   CONDITION AT COMPLETION: Hemodynamically stable and awake  PULSE/DOPPLER EXAM AT END OF PROCEDURE:   (p=palpable; d=doppler signals; 0=none)     Right   Left   AVF    easily palpable and stronger non-pulsatile thrill  Brach  p   p   Rad  p   p   DISPOSITION: PACU   INDICATION(S) FOR PROCEDURE:  Patient is a Right-handed 55 y.o. male with end-stage renal disease currently undergoing hemodialysis via a Right IJ tunneled hemodialysis catheter. A Left wrist radial-cephalic AV fistula was created 12/2015, but despite an easily palpable non-pulsatile thrill, outpatient hemodialysis access center reports being unable to consistently use the AV fistula for hemodialysis. AV fistula duplex ultrasound 10/27 demonstrated a patent AV fistula without any mention of additional diverting venous  outflow branches. Physical exam suggested two parallel forearm cephalic venous outflow branches with decreased thrill at the site of bifurcation. AV fistula duplex was accordingly repeated and demonstrated a patent Left radial-cephalic AV fistula of adequate caliber with 3 large venous outflow branches in the forearm diverting flow from the AV fistula or which the access RN's may inadvertantly be attempting to cannulate. All risks, benefits, and alternatives to above elective procedure were discussed with the patient, who elected to proceed, and informed consent was obtained at that time. Duplex ultrasound was repeated upon administration of anesthesia with essentially the same findings and used to mark the course of the cephalic vein branches.  DETAILS OF PROCEDURE:  Patient was brought to the operating suite and appropriately identified. Light IV sedation was administered with peri-operative prophylactic IV antibiotics. In supine position with patient's Left arm extended, operative site was prepped and draped in the usual sterile fashion. Following a brief time out, a 1 cm skin incision was made perpendicular to the smaller of two mid-forearm cephalic vein outflow branches using a #15 blade scalpel, and the cephalic venous outflow branch was carefully dissected free from surrounding tissues. The venous branch was then compressed, and the slightly larger parallel cephalic venous outflow branch was confirmed to have a stronger, yet still completely non-pulsatile, thrill. Two 2-0 silk ties were used to ligate the smaller of the two forearm cephalic vein outflow branches without dividing the branch. Hemostasis was confirmed, and distal pulses and a thrill over the AV fistula outflow were confirmed. A stronger still completely non-pulsatile thrill overlying the larger forearm cephalic vein branch was once more confirmed, as was hemostasis. The operative site was then irrigated with warm sterile saline and dried, and  all instrument and sponge  counts were confirmed. Incision was then re-approximated in layers using buried interrupted 3-0 Vicryl suture for dermis. Additional local anesthetic was then injected, and a running subcuticular 4-0 Monocryl suture was used to re-approximate epidermis. Skin was cleaned, dried, and sterile Dermabond skin glue was applied. Patient was then safely able to be transferred to PACU for post-operative monitoring and care.   I was present for all aspects of procedure, and there were no intra-operative complications apparent.

## 2016-04-28 NOTE — Discharge Instructions (Signed)
In addition to included general post-operative instructions for ligation of Left radial-cephalic AV fistula venous branch (revision of AV fistula),  Diet: Resume home heart healthy Renal diet.   Activity: No heavy lifting >20 pounds (children, pets, laundry, garbage) or strenuous activity until follow-up, but light activity and walking are encouraged. Do not drive or drink alcohol if taking narcotic pain medications.  Wound care: 2 days after surgery (Wednesday, 12/20), may get incision wet with soapy water and pat dry (do not rub incisions), but no baths or submerging incision underwater until follow-up and no showers (do not get tunneled hemodialysis catheter wet) while tunneled hemodialysis catheter remains indwelling.   Medications: Resume all home medications. For mild to moderate pain: acetaminophen (Tylenol). Narcotic pain medications, if prescribed, can be used for severe pain, though may cause nausea, constipation, and drowsiness. Do not combine Tylenol and Percocet within a 6 hour period as Percocet contains Tylenol. If you do not need the narcotic pain medication, you do not need to fill the prescription.  Call office [phone]) at any time if any questions, worsening pain, fevers/chills, bleeding, drainage from incision site, or other concerns. If hemodialysis access center is able to successfully dialyze via the Left radial-cephalic AV fistula at least x3, please call office to reschedule follow-up appointment for Thursday, 1/4 to include removal of tunneled hemodialysis catheter.

## 2016-04-28 NOTE — Anesthesia Postprocedure Evaluation (Signed)
Anesthesia Post Note  Patient: Steven Sellers  Procedure(s) Performed: Procedure(s) (LRB): REVISION OF LEFT RADIAL CEPHALIC ARTERIOVENOUS (AV) FISTULA  FOR DURABLE HEMODIALYSIS (Left)  Patient location during evaluation: PACU Anesthesia Type: MAC Level of consciousness: awake and alert and oriented Pain management: pain level controlled Vital Signs Assessment: post-procedure vital signs reviewed and stable Respiratory status: spontaneous breathing and patient connected to nasal cannula oxygen Cardiovascular status: blood pressure returned to baseline Postop Assessment: no signs of nausea or vomiting Anesthetic complications: no    Last Vitals:  Vitals:   04/28/16 0825 04/28/16 0900  BP: (!) 178/90 (!) 181/91  Pulse:    Resp: 14 (!) 35  Temp:      Last Pain:  Vitals:   04/28/16 0755  TempSrc: Oral                 ADAMS, AMY A

## 2016-04-28 NOTE — Transfer of Care (Signed)
Immediate Anesthesia Transfer of Care Note  Patient: Steven Sellers  Procedure(s) Performed: Procedure(s): REVISION OF LEFT RADIAL CEPHALIC ARTERIOVENOUS (AV) FISTULA  FOR DURABLE HEMODIALYSIS (Left)  Patient Location: PACU  Anesthesia Type:MAC  Level of Consciousness: awake, alert , oriented and patient cooperative  Airway & Oxygen Therapy: Patient Spontanous Breathing and Patient connected to nasal cannula oxygen  Post-op Assessment: Report given to RN and Post -op Vital signs reviewed and stable  Post vital signs: Reviewed and stable  Last Vitals:  Vitals:   04/28/16 0825 04/28/16 0900  BP: (!) 178/90 (!) 181/91  Pulse:    Resp: 14 (!) 35  Temp:      Last Pain:  Vitals:   04/28/16 0755  TempSrc: Oral      Patients Stated Pain Goal: 9 (43/53/91 2258)  Complications: No apparent anesthesia complications

## 2016-04-28 NOTE — Anesthesia Preprocedure Evaluation (Signed)
Anesthesia Evaluation  Patient identified by MRN, date of birth, ID band Patient awake    Reviewed: Allergy & Precautions, NPO status , Patient's Chart, lab work & pertinent test results  History of Anesthesia Complications (+) PROLONGED EMERGENCE  Airway Mallampati: II  TM Distance: >3 FB     Dental  (+) Partial Upper   Pulmonary shortness of breath, Current Smoker,    breath sounds clear to auscultation       Cardiovascular hypertension, Pt. on home beta blockers and Pt. on medications +CHF   Rhythm:Regular Rate:Normal     Neuro/Psych  Neuromuscular disease ( Limb-girdle muscular dystrophy )    GI/Hepatic negative GI ROS, (+)     substance abuse  alcohol use,   Endo/Other    Renal/GU ESRF and DialysisRenal disease     Musculoskeletal   Abdominal   Peds  Hematology  (+) anemia ,   Anesthesia Other Findings   Reproductive/Obstetrics                             Anesthesia Physical Anesthesia Plan  ASA: III  Anesthesia Plan: MAC   Post-op Pain Management:    Induction: Intravenous  Airway Management Planned: Simple Face Mask  Additional Equipment:   Intra-op Plan:   Post-operative Plan:   Informed Consent: I have reviewed the patients History and Physical, chart, labs and discussed the procedure including the risks, benefits and alternatives for the proposed anesthesia with the patient or authorized representative who has indicated his/her understanding and acceptance.     Plan Discussed with:   Anesthesia Plan Comments:         Anesthesia Quick Evaluation

## 2016-04-30 ENCOUNTER — Encounter (HOSPITAL_COMMUNITY): Payer: Self-pay | Admitting: Surgery

## 2016-04-30 LAB — POCT I-STAT, CHEM 8
BUN: 26 mg/dL — ABNORMAL HIGH (ref 6–20)
CALCIUM ION: 1.09 mmol/L — AB (ref 1.15–1.40)
Chloride: 101 mmol/L (ref 101–111)
Creatinine, Ser: 6.1 mg/dL — ABNORMAL HIGH (ref 0.61–1.24)
Glucose, Bld: 79 mg/dL (ref 65–99)
HEMATOCRIT: 37 % — AB (ref 39.0–52.0)
HEMOGLOBIN: 12.6 g/dL — AB (ref 13.0–17.0)
Potassium: 4.4 mmol/L (ref 3.5–5.1)
SODIUM: 141 mmol/L (ref 135–145)
TCO2: 29 mmol/L (ref 0–100)

## 2016-05-19 ENCOUNTER — Inpatient Hospital Stay (HOSPITAL_COMMUNITY)
Admission: EM | Admit: 2016-05-19 | Discharge: 2016-05-22 | DRG: 640 | Disposition: A | Discharge status: Home / Self Care | Payer: BLUE CROSS/BLUE SHIELD | Attending: Internal Medicine | Admitting: Internal Medicine

## 2016-05-19 ENCOUNTER — Encounter (HOSPITAL_COMMUNITY): Payer: Self-pay | Admitting: *Deleted

## 2016-05-19 ENCOUNTER — Emergency Department (HOSPITAL_COMMUNITY): Payer: BLUE CROSS/BLUE SHIELD

## 2016-05-19 DIAGNOSIS — E8889 Other specified metabolic disorders: Secondary | ICD-10-CM | POA: Diagnosis present

## 2016-05-19 DIAGNOSIS — G35 Multiple sclerosis: Secondary | ICD-10-CM | POA: Diagnosis present

## 2016-05-19 DIAGNOSIS — J9811 Atelectasis: Secondary | ICD-10-CM | POA: Diagnosis present

## 2016-05-19 DIAGNOSIS — D631 Anemia in chronic kidney disease: Secondary | ICD-10-CM | POA: Diagnosis present

## 2016-05-19 DIAGNOSIS — Z86711 Personal history of pulmonary embolism: Secondary | ICD-10-CM

## 2016-05-19 DIAGNOSIS — R7881 Bacteremia: Secondary | ICD-10-CM | POA: Diagnosis present

## 2016-05-19 DIAGNOSIS — J111 Influenza due to unidentified influenza virus with other respiratory manifestations: Secondary | ICD-10-CM | POA: Diagnosis present

## 2016-05-19 DIAGNOSIS — J9601 Acute respiratory failure with hypoxia: Secondary | ICD-10-CM | POA: Diagnosis present

## 2016-05-19 DIAGNOSIS — E8770 Fluid overload, unspecified: Secondary | ICD-10-CM | POA: Diagnosis present

## 2016-05-19 DIAGNOSIS — B9689 Other specified bacterial agents as the cause of diseases classified elsewhere: Secondary | ICD-10-CM | POA: Diagnosis present

## 2016-05-19 DIAGNOSIS — N186 End stage renal disease: Secondary | ICD-10-CM | POA: Diagnosis present

## 2016-05-19 DIAGNOSIS — R0902 Hypoxemia: Secondary | ICD-10-CM

## 2016-05-19 DIAGNOSIS — F17210 Nicotine dependence, cigarettes, uncomplicated: Secondary | ICD-10-CM | POA: Diagnosis present

## 2016-05-19 DIAGNOSIS — Z992 Dependence on renal dialysis: Secondary | ICD-10-CM

## 2016-05-19 DIAGNOSIS — Z789 Other specified health status: Secondary | ICD-10-CM | POA: Diagnosis not present

## 2016-05-19 DIAGNOSIS — G710 Muscular dystrophy: Secondary | ICD-10-CM | POA: Diagnosis present

## 2016-05-19 DIAGNOSIS — E871 Hypo-osmolality and hyponatremia: Secondary | ICD-10-CM | POA: Diagnosis present

## 2016-05-19 DIAGNOSIS — I509 Heart failure, unspecified: Secondary | ICD-10-CM | POA: Diagnosis present

## 2016-05-19 DIAGNOSIS — Z9115 Patient's noncompliance with renal dialysis: Secondary | ICD-10-CM | POA: Diagnosis not present

## 2016-05-19 DIAGNOSIS — R0602 Shortness of breath: Secondary | ICD-10-CM | POA: Diagnosis not present

## 2016-05-19 DIAGNOSIS — I132 Hypertensive heart and chronic kidney disease with heart failure and with stage 5 chronic kidney disease, or end stage renal disease: Secondary | ICD-10-CM | POA: Diagnosis present

## 2016-05-19 DIAGNOSIS — E875 Hyperkalemia: Secondary | ICD-10-CM | POA: Diagnosis present

## 2016-05-19 DIAGNOSIS — J810 Acute pulmonary edema: Secondary | ICD-10-CM | POA: Diagnosis not present

## 2016-05-19 DIAGNOSIS — Z09 Encounter for follow-up examination after completed treatment for conditions other than malignant neoplasm: Secondary | ICD-10-CM

## 2016-05-19 LAB — CBC
HCT: 38.6 % — ABNORMAL LOW (ref 39.0–52.0)
HCT: 31.2 % — ABNORMAL LOW (ref 39.0–52.0)
Hemoglobin: 12.3 g/dL — ABNORMAL LOW (ref 13.0–17.0)
Hemoglobin: 9.6 g/dL — ABNORMAL LOW (ref 13.0–17.0)
MCH: 30.4 pg (ref 26.0–34.0)
MCH: 29.4 pg (ref 26.0–34.0)
MCHC: 31.9 g/dL (ref 30.0–36.0)
MCHC: 30.8 g/dL (ref 30.0–36.0)
MCV: 95.5 fL (ref 78.0–100.0)
MCV: 95.4 fL (ref 78.0–100.0)
PLATELETS: 200 10*3/uL (ref 150–400)
PLATELETS: 147 10*3/uL — AB (ref 150–400)
RBC: 4.04 MIL/uL — ABNORMAL LOW (ref 4.22–5.81)
RBC: 3.27 MIL/uL — ABNORMAL LOW (ref 4.22–5.81)
RDW: 16.3 % — AB (ref 11.5–15.5)
RDW: 15.7 % — AB (ref 11.5–15.5)
WBC: 8.2 10*3/uL (ref 4.0–10.5)
WBC: 6.7 10*3/uL (ref 4.0–10.5)

## 2016-05-19 LAB — CREATININE, SERUM
CREATININE: 9.74 mg/dL — AB (ref 0.61–1.24)
GFR calc Af Amer: 6 mL/min — ABNORMAL LOW (ref >60)
GFR calc non Af Amer: 5 mL/min — ABNORMAL LOW (ref >60)

## 2016-05-19 LAB — BASIC METABOLIC PANEL
Anion gap: 17 — ABNORMAL HIGH (ref 5–15)
BUN: 43 mg/dL — AB (ref 6–20)
CALCIUM: 8.4 mg/dL — AB (ref 8.9–10.3)
CHLORIDE: 100 mmol/L — AB (ref 101–111)
CO2: 22 mmol/L (ref 22–32)
Creatinine, Ser: 9.55 mg/dL — ABNORMAL HIGH (ref 0.61–1.24)
GFR calc Af Amer: 6 mL/min — ABNORMAL LOW (ref >60)
GFR calc non Af Amer: 5 mL/min — ABNORMAL LOW (ref >60)
Glucose, Bld: 97 mg/dL (ref 65–99)
Potassium: 5.2 mmol/L — ABNORMAL HIGH (ref 3.5–5.1)
SODIUM: 139 mmol/L (ref 135–145)

## 2016-05-19 LAB — TROPONIN I
TROPONIN I: 0.06 ng/mL — AB (ref <0.03)
Troponin I: 0.04 ng/mL (ref <0.03)
Troponin I: 0.05 ng/mL (ref <0.03)
Troponin I: 0.09 ng/mL (ref <0.03)

## 2016-05-19 MED ORDER — FUROSEMIDE 40 MG PO TABS
40.0000 mg | ORAL_TABLET | Freq: Every evening | ORAL | Status: DC
  Administered 2016-05-20 – 2016-05-22 (×3): 40 mg via ORAL
  Filled 2016-05-19 (×3): qty 1

## 2016-05-19 MED ORDER — HEPARIN SODIUM (PORCINE) 1000 UNIT/ML DIALYSIS
1000.0000 [IU] | INTRAMUSCULAR | Status: DC | PRN
  Administered 2016-05-19 – 2016-05-20 (×2): 1000 [IU] via INTRAVENOUS_CENTRAL
  Filled 2016-05-19 (×3): qty 1

## 2016-05-19 MED ORDER — HEPARIN SODIUM (PORCINE) 5000 UNIT/ML IJ SOLN
5000.0000 [IU] | Freq: Three times a day (TID) | INTRAMUSCULAR | Status: DC
  Administered 2016-05-19 – 2016-05-22 (×8): 5000 [IU] via SUBCUTANEOUS
  Filled 2016-05-19 (×9): qty 1

## 2016-05-19 MED ORDER — ZOLPIDEM TARTRATE 5 MG PO TABS
10.0000 mg | ORAL_TABLET | Freq: Every day | ORAL | Status: DC
  Administered 2016-05-19 – 2016-05-21 (×3): 10 mg via ORAL
  Filled 2016-05-19 (×3): qty 2

## 2016-05-19 MED ORDER — ACETAMINOPHEN 325 MG PO TABS
650.0000 mg | ORAL_TABLET | Freq: Once | ORAL | Status: AC
Start: 2016-05-19 — End: 2016-05-19
  Administered 2016-05-19: 650 mg via ORAL
  Filled 2016-05-19: qty 2

## 2016-05-19 MED ORDER — NITROGLYCERIN 2 % TD OINT
1.0000 [in_us] | TOPICAL_OINTMENT | Freq: Once | TRANSDERMAL | Status: AC
  Administered 2016-05-19: 1 [in_us] via TOPICAL
  Filled 2016-05-19: qty 1

## 2016-05-19 MED ORDER — ACETAMINOPHEN 500 MG PO TABS
1000.0000 mg | ORAL_TABLET | Freq: Four times a day (QID) | ORAL | Status: DC | PRN
  Administered 2016-05-19: 1000 mg via ORAL
  Administered 2016-05-19: 975 mg via ORAL
  Administered 2016-05-20 – 2016-05-21 (×4): 1000 mg via ORAL
  Filled 2016-05-19 (×5): qty 2

## 2016-05-19 MED ORDER — DILTIAZEM HCL ER COATED BEADS 180 MG PO CP24
180.0000 mg | ORAL_CAPSULE | Freq: Every day | ORAL | Status: DC
  Administered 2016-05-19: 180 mg via ORAL
  Filled 2016-05-19 (×4): qty 1

## 2016-05-19 MED ORDER — LIDOCAINE HCL (PF) 1 % IJ SOLN: 5.0000 mL | INTRAMUSCULAR | Status: DC | PRN

## 2016-05-19 MED ORDER — IPRATROPIUM BROMIDE 0.02 % IN SOLN
0.5000 mg | Freq: Once | RESPIRATORY_TRACT | Status: AC
  Administered 2016-05-19: 0.5 mg via RESPIRATORY_TRACT
  Filled 2016-05-19: qty 2.5

## 2016-05-19 MED ORDER — HYDRALAZINE HCL 20 MG/ML IJ SOLN
10.0000 mg | INTRAMUSCULAR | Status: DC | PRN
  Administered 2016-05-19 – 2016-05-21 (×6): 10 mg via INTRAVENOUS
  Filled 2016-05-19 (×5): qty 1

## 2016-05-19 MED ORDER — LIDOCAINE-PRILOCAINE 2.5-2.5 % EX CREA: 1.0000 "application " | TOPICAL_CREAM | CUTANEOUS | Status: DC | PRN

## 2016-05-19 MED ORDER — ALBUTEROL SULFATE (2.5 MG/3ML) 0.083% IN NEBU
5.0000 mg | INHALATION_SOLUTION | Freq: Once | RESPIRATORY_TRACT | Status: AC
  Administered 2016-05-19: 5 mg via RESPIRATORY_TRACT
  Filled 2016-05-19: qty 6

## 2016-05-19 MED ORDER — LEVOFLOXACIN IN D5W 750 MG/150ML IV SOLN: 750.0000 mg | Freq: Once | INTRAVENOUS | Status: AC

## 2016-05-19 MED ORDER — SODIUM CHLORIDE 0.9 % IV SOLN: 100.0000 mL | INTRAVENOUS | Status: DC | PRN

## 2016-05-19 MED ORDER — ALTEPLASE 2 MG IJ SOLR
2.0000 mg | Freq: Once | INTRAMUSCULAR | Status: DC | PRN
  Filled 2016-05-19: qty 2

## 2016-05-19 MED ORDER — ONDANSETRON HCL 4 MG/2ML IJ SOLN
4.0000 mg | Freq: Once | INTRAMUSCULAR | Status: AC
  Administered 2016-05-19: 4 mg via INTRAVENOUS
  Filled 2016-05-19: qty 2

## 2016-05-19 MED ORDER — SEVELAMER CARBONATE 2.4 G PO PACK
2.4000 g | PACK | Freq: Three times a day (TID) | ORAL | Status: DC | PRN
  Filled 2016-05-19 (×3): qty 1

## 2016-05-19 MED ORDER — METOPROLOL TARTRATE 25 MG PO TABS
25.0000 mg | ORAL_TABLET | Freq: Every day | ORAL | Status: DC
  Administered 2016-05-19 – 2016-05-22 (×4): 25 mg via ORAL
  Filled 2016-05-19 (×5): qty 1

## 2016-05-19 MED ORDER — PENTAFLUOROPROP-TETRAFLUOROETH EX AERO: 1.0000 "application " | INHALATION_SPRAY | CUTANEOUS | Status: DC | PRN

## 2016-05-19 MED ORDER — LEVOFLOXACIN IN D5W 500 MG/100ML IV SOLN
500.0000 mg | INTRAVENOUS | Status: DC
  Administered 2016-05-21: 500 mg via INTRAVENOUS
  Filled 2016-05-19: qty 100

## 2016-05-19 NOTE — ED Notes (Signed)
Per dr Wynetta Emery, will advise dr Karleen Hampshire to  [facility] to get dialysis orders. Advised dr Wynetta Emery to let dr Karleen Hampshire know that dialysis nurse is waiting on his orders to be dialyzed.

## 2016-05-19 NOTE — ED Notes (Addendum)
CRITICAL VALUE ALERT  Critical value received:  Troponin 0.06  Date of notification:  05/19/16  Time of notification:  5993  Critical value read back:Yes.    Nurse who received alert:  c Rosalva Neary rn  MD notified (1st page):  Dr Karleen Hampshire  Time of first page:  1200  MD notified (2nd page):  Time of second page:  Responding MD:  Dr Karleen Hampshire  Time MD responded:  [phone]

## 2016-05-19 NOTE — Consult Note (Signed)
Reason for Consult: CHF and end-stage renal disease Referring Physician: Dr. Hosie Poisson  Steven Sellers is an 56 y.o. male.  HPI: He is a patient who has history of hypertension, muscular dystrophy, end-stage renal disease on maintenance hemodialysis presently came with complaints of difficulty breathing, orthopnea since last night. His last dialysis was on Thursday. Patient misses dialysis on Saturday thinking that she can't make it until next treatment. Presently he denies any nausea or vomiting. Patient also denies any fevers chills or sweating. He has some cough with little sputum production.  Past Medical History:  Diagnosis Date  . Acute renal failure (ARF) (HCC)    Several episodes of HD in 2012  . Alcohol use   . Chronic kidney disease   . Hypertension   . Muscular dystrophy ([place])   . Pulmonary embolism ([place]) 2011  . Tobacco use   . Vision abnormalities     Past Surgical History:  Procedure Laterality Date  . AV FISTULA PLACEMENT Left 12/26/2015   Procedure: CREATION OF LEFT RADIAL-CEPHALIC ARTERIOVENOUS FISTULA FOR HEMODIALYSIS;  Surgeon: Vickie Epley, MD;  Location: [facility];  Service: Vascular;  Laterality: Left;  . AV FISTULA PLACEMENT Left 04/28/2016   Procedure: REVISION OF LEFT RADIAL CEPHALIC ARTERIOVENOUS (AV) FISTULA  FOR DURABLE HEMODIALYSIS;  Surgeon: Vickie Epley, MD;  Location: [facility];  Service: Vascular;  Laterality: Left;  . CARDIAC CATHETERIZATION  2002   normal coronary arteries  . CENTRAL VENOUS CATHETER INSERTION Right 12/04/2015   Procedure: INSERTION OF TUNNELED HEMODIALYSIS CATHETER RIGHT INTERNAL JUGULAR;  Surgeon: Vickie Epley, MD;  Location: [facility];  Service: General;  Laterality: Right;    Family History  Problem Relation Age of Onset  . Hypertension Mother   . Heart attack Father     Social History:  reports that he has been smoking Cigarettes.  He started smoking about 42 years ago. He has been smoking about 1.00 pack per day. He  has never used smokeless tobacco. He reports that he drinks about 14.4 oz of alcohol per week . He reports that he does not use drugs.  Allergies:  Allergies  Allergen Reactions  . Other Other (See Comments)    IV contrast- Renal issues    Medications: I have reviewed the patient's current medications.  Results for orders placed or performed during the hospital encounter of 05/19/16 (from the past 48 hour(s))  Basic metabolic panel     Status: Abnormal   Collection Time: 05/19/16  1:50 AM  Result Value Ref Range   Sodium 139 135 - 145 mmol/L   Potassium 5.2 (H) 3.5 - 5.1 mmol/L   Chloride 100 (L) 101 - 111 mmol/L   CO2 22 22 - 32 mmol/L   Glucose, Bld 97 65 - 99 mg/dL   BUN 43 (H) 6 - 20 mg/dL   Creatinine, Ser 9.55 (H) 0.61 - 1.24 mg/dL   Calcium 8.4 (L) 8.9 - 10.3 mg/dL   GFR calc non Af Amer 5 (L) >60 mL/min   GFR calc Af Amer 6 (L) >60 mL/min    Comment: (NOTE) The eGFR has been calculated using the CKD EPI equation. This calculation has not been validated in all clinical situations. eGFR's persistently <60 mL/min signify possible Chronic Kidney Disease.    Anion gap 17 (H) 5 - 15  CBC     Status: Abnormal   Collection Time: 05/19/16  1:50 AM  Result Value Ref Range   WBC 8.2 4.0 - 10.5 K/uL  RBC 4.04 (L) 4.22 - 5.81 MIL/uL   Hemoglobin 12.3 (L) 13.0 - 17.0 g/dL   HCT 38.6 (L) 39.0 - 52.0 %   MCV 95.5 78.0 - 100.0 fL   MCH 30.4 26.0 - 34.0 pg   MCHC 31.9 30.0 - 36.0 g/dL   RDW 16.3 (H) 11.5 - 15.5 %   Platelets 200 150 - 400 K/uL  Troponin I     Status: Abnormal   Collection Time: 05/19/16  1:50 AM  Result Value Ref Range   Troponin I 0.04 (HH) <0.03 ng/mL    Comment: CRITICAL RESULT CALLED TO, READ BACK BY AND VERIFIED WITH:  WILSON,S @ 0231 ON 05/19/16 BY JUW   CBC     Status: Abnormal   Collection Time: 05/19/16  5:39 AM  Result Value Ref Range   WBC 6.7 4.0 - 10.5 K/uL   RBC 3.27 (L) 4.22 - 5.81 MIL/uL   Hemoglobin 9.6 (L) 13.0 - 17.0 g/dL    Comment:  DELTA CHECK NOTED   HCT 31.2 (L) 39.0 - 52.0 %   MCV 95.4 78.0 - 100.0 fL   MCH 29.4 26.0 - 34.0 pg   MCHC 30.8 30.0 - 36.0 g/dL   RDW 15.7 (H) 11.5 - 15.5 %   Platelets 147 (L) 150 - 400 K/uL  Creatinine, serum     Status: Abnormal   Collection Time: 05/19/16  5:39 AM  Result Value Ref Range   Creatinine, Ser 9.74 (H) 0.61 - 1.24 mg/dL   GFR calc non Af Amer 5 (L) >60 mL/min   GFR calc Af Amer 6 (L) >60 mL/min    Comment: (NOTE) The eGFR has been calculated using the CKD EPI equation. This calculation has not been validated in all clinical situations. eGFR's persistently <60 mL/min signify possible Chronic Kidney Disease.   Troponin I     Status: Abnormal   Collection Time: 05/19/16  5:39 AM  Result Value Ref Range   Troponin I 0.05 (HH) <0.03 ng/mL    Comment: CRITICAL RESULT CALLED TO, READ BACK BY AND VERIFIED WITH: WILSON,S AT 6:25AM ON 05/19/16 BY FESTERMAN,C   Troponin I     Status: Abnormal   Collection Time: 05/19/16 11:11 AM  Result Value Ref Range   Troponin I 0.06 (HH) <0.03 ng/mL    Comment: CRITICAL RESULT CALLED TO, READ BACK BY AND VERIFIED WITH: EDWARDS,C AT 11:55AM ON 05/19/16 BY FESTERMAN,C     Dg Chest Portable 1 View  Result Date: 05/19/2016 CLINICAL DATA:  Shortness of breath beginning about 1.5 hours ago. Missed dialysis on Saturday due to upper respiratory infection symptoms. EXAM: PORTABLE CHEST 1 VIEW COMPARISON:  12/15/2015 FINDINGS: Normal heart size. Pulmonary vascularity is normal. Diffuse interstitial and perihilar airspace disease consistent with edema. Multifocal pneumonia not excluded. No focal consolidation. No blunting of costophrenic angles. No pneumothorax. No blunting of costophrenic angles. No pneumothorax. Mediastinal contours appear intact. Right-sided central venous catheter with tip over the mid SVC region. IMPRESSION: Perihilar airspace and diffuse interstitial infiltrates likely representing edema but can't exclude multifocal pneumonia  in the appropriate clinical setting. No focal consolidation. Electronically Signed   By: Lucienne Capers M.D.   On: 05/19/2016 02:08    Review of Systems  Constitutional: Negative for chills and fever.  HENT: Positive for congestion.   Respiratory: Positive for sputum production, shortness of breath and wheezing.   Cardiovascular: Positive for orthopnea. Negative for chest pain and palpitations.  Gastrointestinal: Negative for nausea and vomiting.   Blood  pressure (!) 196/118, pulse 74, temperature 97.9 F (36.6 C), temperature source Oral, resp. rate 19, height 6' 2" (1.88 m), weight 74.8 kg (165 lb), SpO2 97 %. Physical Exam  Constitutional: He is oriented to person, place, and time. No distress.  Eyes: No scleral icterus.  Neck: JVD present.  Cardiovascular: Normal rate and regular rhythm.   Respiratory: He has wheezes. He has rales.  GI: He exhibits no distension. There is no tenderness.  Musculoskeletal: He exhibits no edema.  Neurological: He is alert and oriented to person, place, and time.    Assessment/Plan: Problem #1 fluid overload: This is a combination of noncompliance with salt and fluid intake, declining urine output, missing his dialysis. Problem #2 end-stage renal disease: Status post hemodialysis on Thursday. Presently doesn't have any nausea or vomiting. His potassium is high normal. Problem #3 hypertension: His blood pressure is very high. At this moment seems to be better. Problem #4 anemia: His hemoglobin is within our target goal Problem #5 metabolic bone disease: His calcium is in drainage. Phosphorus at this moment is not available. Problem #6 history of PE Problem #7 history of multiple sclerosis Plan: We'll make arrangements for patient to get dialysis today for 4 hours We will remove 2-6/2 L his systolic blood pressure remains above 90 Will possibly dialyze him again in the morning depending on his clinical condition. We'll check his renal panel in the  morning.  , S 05/19/2016, 2:03 PM

## 2016-05-19 NOTE — ED Notes (Signed)
CRITICAL VALUE ALERT  Critical value received:  Trop 0.04  Date of notification:  05/19/2016  Time of notification:  3220  Critical value read back:Yes.    Nurse who received alert:  Iona Coach  MD notified (1st page):  knapp  Time of first page:  0232  Time MD responded:  [phone]

## 2016-05-19 NOTE — ED Notes (Signed)
Pharmacy aware of cardizem

## 2016-05-19 NOTE — ED Notes (Signed)
Secretary called to nephrologist office again to return call to hospitalist for dialysis orders.  RN spoke with hospitalist who has also tried to page and call nephrology.

## 2016-05-19 NOTE — ED Triage Notes (Signed)
Pt c/o waking up sob tonight, states that he missed dialysis this past Saturday due to being sick with uri symptoms,

## 2016-05-19 NOTE — ED Notes (Signed)
Pt changed to high flow O2 nasal canula w/ humidification.

## 2016-05-19 NOTE — H&P (Signed)
History and Physical    Steven Sellers HMC:[id number] DOB: 05-29-1960 DOA: 05/19/2016  PCP: Purvis Kilts, MD  Patient coming from: Home.    Chief Complaint:   Missed dialysis yesterday.  SOB today.   HPI: Steven Sellers is an 56 y.o. male with hx of ESRD on HD ( Dr Hinda Lenis, T Th Sat--[name]), Hx of HTN, hx of PE, tobacco abuse, recent AVF revision, Muscular dystrophy, missed dialysis Sat as he was not feeling well, presented to the ER feeling SOB.  CXR showed pulmonary edema, cannot exclude multifocal PNA. His original BP was 230/110.  He was given nebs, and NTP along with oxygen, and he felt better.  Further work up included a K of 5.2, Cr of 9.5, and Hb of 12.3 g per dL, and EKG Showed NSR with no acute ST T changes.  His troponin was 0.04.  He felt better and his BP improved to 160/90.  Nephrology consulted by EDP and Dr Mercy Moore recommended to keep him here at [facility] and arrange for dialysis later today.  Hospitalist was asked to admit him for same.  He has no CP, fever, chills or coughs.   ED Course:  See above.  Rewiew of Systems:  Constitutional: Negative for malaise, fever and chills. No significant weight loss or weight gain Eyes: Negative for eye pain, redness and discharge, diplopia, visual changes, or flashes of light. ENMT: Negative for ear pain, hoarseness, nasal congestion, sinus pressure and sore throat. No headaches; tinnitus, drooling, or problem swallowing. Cardiovascular: Negative for chest pain, palpitations, diaphoresis, and peripheral edema. ; No orthopnea, PND Respiratory: Negative for cough, hemoptysis, wheezing and stridor. No pleuritic chestpain. Gastrointestinal: Negative for diarrhea, constipation,  melena, blood in stool, hematemesis, jaundice and rectal bleeding.    Genitourinary: Negative for frequency, dysuria, incontinence,flank pain and hematuria; Musculoskeletal: Negative for back pain and neck pain. Negative for swelling and trauma.;  Skin: .  Negative for pruritus, rash, abrasions, bruising and skin lesion.; ulcerations Neuro: Negative for headache, lightheadedness and neck stiffness. Negative for weakness, altered level of consciousness , altered mental status, extremity weakness, burning feet, involuntary movement, seizure and syncope.  Psych: negative for anxiety, depression, insomnia, tearfulness, panic attacks, hallucinations, paranoia, suicidal or homicidal ideation    Past Medical History:  Diagnosis Date  . Acute renal failure (ARF) (HCC)    Several episodes of HD in 2012  . Alcohol use   . Chronic kidney disease   . Hypertension   . Muscular dystrophy ([place])   . Pulmonary embolism ([place]) 2011  . Tobacco use   . Vision abnormalities     Past Surgical History:  Procedure Laterality Date  . AV FISTULA PLACEMENT Left 12/26/2015   Procedure: CREATION OF LEFT RADIAL-CEPHALIC ARTERIOVENOUS FISTULA FOR HEMODIALYSIS;  Surgeon: Vickie Epley, MD;  Location: [facility];  Service: Vascular;  Laterality: Left;  . AV FISTULA PLACEMENT Left 04/28/2016   Procedure: REVISION OF LEFT RADIAL CEPHALIC ARTERIOVENOUS (AV) FISTULA  FOR DURABLE HEMODIALYSIS;  Surgeon: Vickie Epley, MD;  Location: [facility];  Service: Vascular;  Laterality: Left;  . CARDIAC CATHETERIZATION  2002   normal coronary arteries  . CENTRAL VENOUS CATHETER INSERTION Right 12/04/2015   Procedure: INSERTION OF TUNNELED HEMODIALYSIS CATHETER RIGHT INTERNAL JUGULAR;  Surgeon: Vickie Epley, MD;  Location: [facility];  Service: General;  Laterality: Right;     reports that he has been smoking Cigarettes.  He started smoking about 42 years ago. He has been smoking about 1.00 pack  per day. He has never used smokeless tobacco. He reports that he drinks about 14.4 oz of alcohol per week . He reports that he does not use drugs.  Allergies  Allergen Reactions  . Other Other (See Comments)    IV contrast- Renal issues    Family History  Problem Relation Age of Onset  .  Hypertension Mother   . Heart attack Father      Prior to Admission medications   Medication Sig Start Date End Date Taking? Authorizing Provider  acetaminophen (TYLENOL) 500 MG tablet Take 1,000 mg by mouth every 6 (six) hours as needed for mild pain or moderate pain.    Historical Provider, MD  diltiazem (TIAZAC) 180 MG 24 hr capsule Take 180 mg by mouth daily.  11/28/15   Historical Provider, MD  furosemide (LASIX) 20 MG tablet Take 40 mg by mouth every evening.    Historical Provider, MD  metoprolol (LOPRESSOR) 50 MG tablet Take 25 mg by mouth daily.     Historical Provider, MD  sevelamer carbonate (RENVELA) 2.4 g PACK Take 2.4 g by mouth See admin instructions. Takes only if eating Dairy products (1 pack per 2 oz of water) 04/04/16   Historical Provider, MD  zolpidem (AMBIEN) 10 MG tablet Take 1 tablet by mouth at bedtime. 03/23/16   Historical Provider, MD    Physical Exam: Vitals:   05/19/16 0218 05/19/16 0230 05/19/16 0300 05/19/16 0330  BP:  194/96 171/96 163/99  Pulse:  62 (!) 58 (!) 58  Resp:  19 17 17   Temp:      TempSrc:      SpO2: 95% 96% 98% 97%  Weight:      Height:          Constitutional: NAD, calm, comfortable Vitals:   05/19/16 0218 05/19/16 0230 05/19/16 0300 05/19/16 0330  BP:  194/96 171/96 163/99  Pulse:  62 (!) 58 (!) 58  Resp:  19 17 17   Temp:      TempSrc:      SpO2: 95% 96% 98% 97%  Weight:      Height:       Eyes: PERRL, lids and conjunctivae normal ENMT: Mucous membranes are moist. Posterior pharynx clear of any exudate or lesions.Normal dentition.  Neck: normal, supple, no masses, no thyromegaly Respiratory: clear to auscultation bilaterally, no wheezing, bilateral rales, right greater than left.  Normal respiratory effort. No accessory muscle use.  Cardiovascular: Regular rate and rhythm, no murmurs / rubs / gallops. No extremity edema. 2+ pedal pulses. No carotid bruits.  Abdomen: no tenderness, no masses palpated. No hepatosplenomegaly.  Bowel sounds positive.  Musculoskeletal: no clubbing / cyanosis. No joint deformity upper and lower extremities. Good ROM, no contractures. Normal muscle tone.  Skin: no rashes, lesions, ulcers. No induration Neurologic: CN 2-12 grossly intact. Sensation intact, DTR normal. Strength 5/5 in all 4.  Psychiatric: Normal judgment and insight. Alert and oriented x 3. Normal mood.     Labs on Admission: I have personally reviewed following labs and imaging studies  CBC:  Recent Labs Lab 05/19/16 0150  WBC 8.2  HGB 12.3*  HCT 38.6*  MCV 95.5  PLT 161   Basic Metabolic Panel:  Recent Labs Lab 05/19/16 0150  NA 139  K 5.2*  CL 100*  CO2 22  GLUCOSE 97  BUN 43*  CREATININE 9.55*  CALCIUM 8.4*    Recent Labs Lab 05/19/16 0150  TROPONINI 0.04*   BNP (last 3 results) Urine analysis:  Component Value Date/Time   COLORURINE YELLOW 11/30/2015 1707   APPEARANCEUR HAZY (A) 11/30/2015 1707   LABSPEC 1.015 11/30/2015 1707   PHURINE 6.0 11/30/2015 1707   GLUCOSEU 250 (A) 11/30/2015 1707   HGBUR LARGE (A) 11/30/2015 1707   BILIRUBINUR NEGATIVE 11/30/2015 1707   KETONESUR NEGATIVE 11/30/2015 1707   PROTEINUR >300 (A) 11/30/2015 1707   UROBILINOGEN 0.2 04/11/2010 2336   NITRITE NEGATIVE 11/30/2015 1707   LEUKOCYTESUR NEGATIVE 11/30/2015 1707   Radiological Exams on Admission: Dg Chest Portable 1 View  Result Date: 05/19/2016 CLINICAL DATA:  Shortness of breath beginning about 1.5 hours ago. Missed dialysis on Saturday due to upper respiratory infection symptoms. EXAM: PORTABLE CHEST 1 VIEW COMPARISON:  12/15/2015 FINDINGS: Normal heart size. Pulmonary vascularity is normal. Diffuse interstitial and perihilar airspace disease consistent with edema. Multifocal pneumonia not excluded. No focal consolidation. No blunting of costophrenic angles. No pneumothorax. No blunting of costophrenic angles. No pneumothorax. Mediastinal contours appear intact. Right-sided central venous  catheter with tip over the mid SVC region. IMPRESSION: Perihilar airspace and diffuse interstitial infiltrates likely representing edema but can't exclude multifocal pneumonia in the appropriate clinical setting. No focal consolidation. Electronically Signed   By: Lucienne Capers M.D.   On: 05/19/2016 02:08    EKG: Independently reviewed.  Assessment/Plan Principal Problem:   ESRD needing dialysis ([place]) Active Problems:   Hyperkalemia   Central venous catheter in place   SOB (shortness of breath)    PLAN:   ESRD on chronic HD, requiring dialysis:  Will admit OBS, consult nephrology in the am for dialysis (Continue with NTP and oxygen.  After dialysis, reassess for potential discharge today.  He has troponin of 0.04 with unremarkable EKG and no chest pain, and it s unlikely to be ACS, but we will obtain serial troponins.    Hyperkalemia:  Only mildly elevated.  Proceed with dialysis.    HTN:  BP is elevated, but has improved.  Suspect will get better still after dialysis.     DVT prophylaxis: SUBQ Heparin.  Code Status: FULL CODE>  Family Communication: Wife and daughter at bedside.  Disposition Plan: To home  Consults called: Nephrology.  Admission status: OBS.    Liridona Mashaw MD FACP. [facility]  If 7PM-7AM, please contact night-coverage [email or web address] Password [email or web address]  05/19/2016, 4:04 AM

## 2016-05-19 NOTE — ED Notes (Addendum)
CRITICAL VALUE ALERT  Critical value received:  Trop 0.05  Date of notification:  05/19/2016  Time of notification:  0623  Critical value read back:Yes.    Nurse who received alert:  Iona Coach  MD notified (1st page):  Truman Hayward 1992    Time MD responded:  [phone]

## 2016-05-19 NOTE — ED Provider Notes (Signed)
[facility] DEPT Provider Note   CSN: [id number] Arrival date & time: 05/19/16  0127  Time seen 01:48 AM  History   Chief Complaint Chief Complaint  Patient presents with  . Shortness of Breath    HPI Steven Sellers is a 56 y.o. male.  HPI  patient has end-stage renal disease and states he normally goes to dialysis on Tuesday, Thursday, and Saturday. He missed Saturday, January 6 because of URI symptoms. He states he was feeling fine until about an hour and a half ago when he got up from sleep and then he was unable to lay back down due to shortness of breath. He denies any swelling of his extremities or abdominal swelling. He denies any chest pain. He states he did get diaphoretic. He states sitting up makes the breathing better, laying down makes it worse. He denies feeling any rattling when he breathes. He denies any fever. He states he was going to try to wait and call the dialysis center earlier this morning to see if they could work him in today however he felt too bad.  PCP Dr Hilma Favors [facility]  Past Medical History:  Diagnosis Date  . Acute renal failure (ARF) (HCC)    Several episodes of HD in 2012  . Alcohol use   . Chronic kidney disease   . Hypertension   . Muscular dystrophy ([place])   . Pulmonary embolism ([place]) 2011  . Tobacco use   . Vision abnormalities     Patient Active Problem List   Diagnosis Date Noted  . CHF (congestive heart failure) ([place]) 12/16/2015  . CKD (chronic kidney disease) requiring chronic dialysis ([place]) 12/16/2015  . SOB (shortness of breath) 12/16/2015  . Central venous catheter in place   . Hyponatremia 11/30/2015  . Anemia 11/30/2015  . ESRD needing dialysis ([place]) 11/30/2015  . Acute renal failure (ARF) ([place]) 11/30/2015  . Hyperkalemia 05/12/2015  . CKD (chronic kidney disease) stage 3, GFR 30-59 ml/min 05/12/2015  . 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 Muscle atrophy 04/25/2015  . Arm weakness 04/25/2015  .  Leg weakness 04/25/2015  . Dysesthesia 04/25/2015  . Essential hypertension 05/21/2010  . PULMONARY EMBOLISM 05/21/2010  . PLEURAL EFFUSION 05/21/2010  . RENAL FAILURE, ACUTE 05/21/2010    Past Surgical History:  Procedure Laterality Date  . AV FISTULA PLACEMENT Left 12/26/2015   Procedure: CREATION OF LEFT RADIAL-CEPHALIC ARTERIOVENOUS FISTULA FOR HEMODIALYSIS;  Surgeon: Vickie Epley, MD;  Location: [facility];  Service: Vascular;  Laterality: Left;  . AV FISTULA PLACEMENT Left 04/28/2016   Procedure: REVISION OF LEFT RADIAL CEPHALIC ARTERIOVENOUS (AV) FISTULA  FOR DURABLE HEMODIALYSIS;  Surgeon: Vickie Epley, MD;  Location: [facility];  Service: Vascular;  Laterality: Left;  . CARDIAC CATHETERIZATION  2002   normal coronary arteries  . CENTRAL VENOUS CATHETER INSERTION Right 12/04/2015   Procedure: INSERTION OF TUNNELED HEMODIALYSIS CATHETER RIGHT INTERNAL JUGULAR;  Surgeon: Vickie Epley, MD;  Location: [facility];  Service: General;  Laterality: Right;       Home Medications    Prior to Admission medications   Medication Sig Start Date End Date Taking? Authorizing Provider  acetaminophen (TYLENOL) 500 MG tablet Take 1,000 mg by mouth every 6 (six) hours as needed for mild pain or moderate pain.    Historical Provider, MD  diltiazem (TIAZAC) 180 MG 24 hr capsule Take 180 mg by mouth daily.  11/28/15   Historical Provider, MD  furosemide (LASIX) 20 MG tablet Take 40  mg by mouth every evening.    Historical Provider, MD  metoprolol (LOPRESSOR) 50 MG tablet Take 25 mg by mouth daily.     Historical Provider, MD  sevelamer carbonate (RENVELA) 2.4 g PACK Take 2.4 g by mouth See admin instructions. Takes only if eating Dairy products (1 pack per 2 oz of water) 04/04/16   Historical Provider, MD  zolpidem (AMBIEN) 10 MG tablet Take 1 tablet by mouth at bedtime. 03/23/16   Historical Provider, MD    Family History Family History  Problem Relation Age of Onset  . Hypertension Mother   .  Heart attack Father     Social History Social History  Substance Use Topics  . Smoking status: Current Every Day Smoker    Packs/day: 1.00    Types: Cigarettes    Start date: 05/12/1974  . Smokeless tobacco: Never Used  . Alcohol use 14.4 oz/week    24 Standard drinks or equivalent per week     Comment: occ  disability for ESRD Denies drinking to me Smokes 1/2 ppd   Allergies   Other   Review of Systems Review of Systems  All other systems reviewed and are negative.    Physical Exam Updated Vital Signs BP (!) 231/110   Pulse 77   Temp 97.9 F (36.6 C) (Oral)   Resp 26   Ht 6\' 2"  (1.88 m)   Wt 165 lb (74.8 kg)   SpO2 90%   BMI 21.18 kg/m   Vital signs normal    Physical Exam  Constitutional: He is oriented to person, place, and time. He appears well-developed and well-nourished.  Non-toxic appearance. He does not appear ill. He appears distressed.  HENT:  Head: Normocephalic and atraumatic.  Right Ear: External ear normal.  Left Ear: External ear normal.  Nose: Nose normal. No mucosal edema or rhinorrhea.  Mouth/Throat: Oropharynx is clear and moist and mucous membranes are normal. No dental abscesses or uvula swelling.  Eyes: Conjunctivae and EOM are normal. Pupils are equal, round, and reactive to light.  Neck: Normal range of motion and full passive range of motion without pain. Neck supple.  Cardiovascular: Normal rate, regular rhythm and normal heart sounds.  Exam reveals no gallop and no friction rub.   No murmur heard. Pulmonary/Chest: Accessory muscle usage present. Tachypnea noted. No respiratory distress. He has decreased breath sounds. He has no wheezes. He has no rhonchi. He has no rales. He exhibits no tenderness and no crepitus.  Abdominal: Soft. Normal appearance and bowel sounds are normal. He exhibits no distension. There is no tenderness. There is no rebound and no guarding.  Musculoskeletal: Normal range of motion. He exhibits no edema or  tenderness.  Moves all extremities well.   Neurological: He is alert and oriented to person, place, and time. He has normal strength. No cranial nerve deficit.  Skin: Skin is warm, dry and intact. No rash noted. No erythema. No pallor.  Psychiatric: He has a normal mood and affect. His speech is normal and behavior is normal. His mood appears not anxious.  Nursing note and vitals reviewed.    ED Treatments / Results  Labs (all labs ordered are listed, but only abnormal results are displayed) Results for orders placed or performed during the hospital encounter of 59/56/38  Basic metabolic panel  Result Value Ref Range   Sodium 139 135 - 145 mmol/L   Potassium 5.2 (H) 3.5 - 5.1 mmol/L   Chloride 100 (L) 101 - 111 mmol/L  CO2 22 22 - 32 mmol/L   Glucose, Bld 97 65 - 99 mg/dL   BUN 43 (H) 6 - 20 mg/dL   Creatinine, Ser 9.55 (H) 0.61 - 1.24 mg/dL   Calcium 8.4 (L) 8.9 - 10.3 mg/dL   GFR calc non Af Amer 5 (L) >60 mL/min   GFR calc Af Amer 6 (L) >60 mL/min   Anion gap 17 (H) 5 - 15  CBC  Result Value Ref Range   WBC 8.2 4.0 - 10.5 K/uL   RBC 4.04 (L) 4.22 - 5.81 MIL/uL   Hemoglobin 12.3 (L) 13.0 - 17.0 g/dL   HCT 38.6 (L) 39.0 - 52.0 %   MCV 95.5 78.0 - 100.0 fL   MCH 30.4 26.0 - 34.0 pg   MCHC 31.9 30.0 - 36.0 g/dL   RDW 16.3 (H) 11.5 - 15.5 %   Platelets 200 150 - 400 K/uL  Troponin I  Result Value Ref Range   Troponin I 0.04 (HH) <0.03 ng/mL  CBC  Result Value Ref Range   WBC 6.7 4.0 - 10.5 K/uL   RBC 3.27 (L) 4.22 - 5.81 MIL/uL   Hemoglobin 9.6 (L) 13.0 - 17.0 g/dL   HCT 31.2 (L) 39.0 - 52.0 %   MCV 95.4 78.0 - 100.0 fL   MCH 29.4 26.0 - 34.0 pg   MCHC 30.8 30.0 - 36.0 g/dL   RDW 15.7 (H) 11.5 - 15.5 %   Platelets 147 (L) 150 - 400 K/uL    Laboratory interpretation all normal except Chronic renal failure, mildly elevated potassium, mild anemia   EKG  EKG Interpretation None     ED ECG REPORT   Date: 05/19/2016  Rate: 71  Rhythm: normal sinus rhythm   QRS Axis: normal  Intervals: normal  ST/T Wave abnormalities: Some peaking of T waves  Conduction Disutrbances:IRBBB  Narrative Interpretation: LAE  Old EKG Reviewed: none available  I have personally reviewed the EKG tracing and agree with the computerized printout as noted.   Radiology Dg Chest Portable 1 View  Result Date: 05/19/2016 CLINICAL DATA:  Shortness of breath beginning about 1.5 hours ago. Missed dialysis on Saturday due to upper respiratory infection symptoms. EXAM: PORTABLE CHEST 1 VIEW COMPARISON:  12/15/2015 FINDINGS: Normal heart size. Pulmonary vascularity is normal. Diffuse interstitial and perihilar airspace disease consistent with edema. Multifocal pneumonia not excluded. No focal consolidation. No blunting of costophrenic angles. No pneumothorax. No blunting of costophrenic angles. No pneumothorax. Mediastinal contours appear intact. Right-sided central venous catheter with tip over the mid SVC region. IMPRESSION: Perihilar airspace and diffuse interstitial infiltrates likely representing edema but can't exclude multifocal pneumonia in the appropriate clinical setting. No focal consolidation. Electronically Signed   By: Lucienne Capers M.D.   On: 05/19/2016 02:08    Procedures Procedures (including critical care time)  Medications Ordered in ED Medications  nitroGLYCERIN (NITROGLYN) 2 % ointment 1 inch (1 inch Topical Given 05/19/16 0213)  acetaminophen (TYLENOL) tablet 650 mg (650 mg Oral Given 05/19/16 0215)  albuterol (PROVENTIL) (2.5 MG/3ML) 0.083% nebulizer solution 5 mg (5 mg Nebulization Given 05/19/16 0218)  ipratropium (ATROVENT) nebulizer solution 0.5 mg (0.5 mg Nebulization Given 05/19/16 0218)     Initial Impression / Assessment and Plan / ED Course  I have reviewed the triage vital signs and the nursing notes.  Pertinent labs & imaging results that were available during my care of the patient were reviewed by me and considered in my medical decision making  (see chart for  details).  Clinical Course    Patient was given albuterol nebulizer with Atrovent for his shortness of breath and also in case he has hyperkalemia. Nitroglycerin paste was placed on his chest for his hypertension. On review of his prior vital signs his blood pressure at its best is in the 180/95 range.  After reviewing his laboratory and chest x-ray results patient was felt to need dialysis however it will be unavailable for several hours here.  03:25 AM Dr Mercy Moore, nephrologist at [facility], states he feels patient can be admitted here since he is oxygenating well with [place] oxygen and get dialysis in the morning.   03:49 AM Dr Marin Comment, hospitalist will admit  Final Clinical Impressions(s) / ED Diagnoses   Final diagnoses:  ESRD on hemodialysis ([place])  Acute pulmonary edema ([place])  Hypoxia    Plan admission for dialysis later this morning  Rolland Porter, MD, Barbette Or, MD 05/19/16 [phone]

## 2016-05-19 NOTE — Progress Notes (Signed)
Steven Sellers is an 56 y.o. male with hx of ESRD on HD ( Dr Hinda Lenis, T Th Sat--[name]), Hx of HTN, hx of PE, tobacco abuse, recent AVF revision, Muscular dystrophy, missed dialysis Sat as he was not feeling well, presented to the ER feeling SOB.  CXR showed pulmonary edema, cannot exclude multifocal PNA. Admitted for observation for HD. Nephrology consulted.  Accelerated Hypertension: prn hydralazine ordered.  Continue to monitor.   Hosie Poisson, MD [phone]

## 2016-05-19 NOTE — ED Notes (Signed)
Talked with dr Karleen Hampshire and she stated she will call nephro

## 2016-05-19 NOTE — ED Notes (Signed)
Pt states he got up this morning & walked around and he became SOB. Pt says he missed dialysis on Saturday.

## 2016-05-20 ENCOUNTER — Inpatient Hospital Stay (HOSPITAL_COMMUNITY): Payer: BLUE CROSS/BLUE SHIELD

## 2016-05-20 ENCOUNTER — Other Ambulatory Visit: Payer: Self-pay

## 2016-05-20 LAB — RENAL FUNCTION PANEL
Albumin: 3.4 g/dL — ABNORMAL LOW (ref 3.5–5.0)
Anion gap: 15 (ref 5–15)
BUN: 38 mg/dL — AB (ref 6–20)
CALCIUM: 8.1 mg/dL — AB (ref 8.9–10.3)
CO2: 25 mmol/L (ref 22–32)
CREATININE: 6.88 mg/dL — AB (ref 0.61–1.24)
Chloride: 93 mmol/L — ABNORMAL LOW (ref 101–111)
GFR, EST AFRICAN AMERICAN: 9 mL/min — AB (ref >60)
GFR, EST NON AFRICAN AMERICAN: 8 mL/min — AB (ref >60)
Glucose, Bld: 105 mg/dL — ABNORMAL HIGH (ref 65–99)
Phosphorus: 6.1 mg/dL — ABNORMAL HIGH (ref 2.5–4.6)
Potassium: 4.4 mmol/L (ref 3.5–5.1)
SODIUM: 133 mmol/L — AB (ref 135–145)

## 2016-05-20 LAB — CBC
HCT: 33.3 % — ABNORMAL LOW (ref 39.0–52.0)
Hemoglobin: 11.1 g/dL — ABNORMAL LOW (ref 13.0–17.0)
MCH: 31.2 pg (ref 26.0–34.0)
MCHC: 33.3 g/dL (ref 30.0–36.0)
MCV: 93.5 fL (ref 78.0–100.0)
PLATELETS: 166 10*3/uL (ref 150–400)
RBC: 3.56 MIL/uL — AB (ref 4.22–5.81)
RDW: 15.8 % — AB (ref 11.5–15.5)
WBC: 5.7 10*3/uL (ref 4.0–10.5)

## 2016-05-20 LAB — BLOOD CULTURE ID PANEL (REFLEXED)
Acinetobacter baumannii: NOT DETECTED
CANDIDA GLABRATA: NOT DETECTED
CANDIDA TROPICALIS: NOT DETECTED
Candida albicans: NOT DETECTED
Candida krusei: NOT DETECTED
Candida parapsilosis: NOT DETECTED
ENTEROBACTER CLOACAE COMPLEX: NOT DETECTED
ENTEROCOCCUS SPECIES: NOT DETECTED
Enterobacteriaceae species: NOT DETECTED
Escherichia coli: NOT DETECTED
HAEMOPHILUS INFLUENZAE: NOT DETECTED
KLEBSIELLA PNEUMONIAE: NOT DETECTED
Klebsiella oxytoca: NOT DETECTED
LISTERIA MONOCYTOGENES: NOT DETECTED
METHICILLIN RESISTANCE: DETECTED — AB
Neisseria meningitidis: NOT DETECTED
PROTEUS SPECIES: NOT DETECTED
Pseudomonas aeruginosa: NOT DETECTED
STAPHYLOCOCCUS AUREUS BCID: NOT DETECTED
STAPHYLOCOCCUS SPECIES: DETECTED — AB
STREPTOCOCCUS AGALACTIAE: NOT DETECTED
STREPTOCOCCUS SPECIES: NOT DETECTED
Serratia marcescens: NOT DETECTED
Streptococcus pneumoniae: NOT DETECTED
Streptococcus pyogenes: NOT DETECTED

## 2016-05-20 LAB — HEPATITIS B SURFACE ANTIGEN: HEP B S AG: NEGATIVE

## 2016-05-20 LAB — HEPATITIS B CORE ANTIBODY, TOTAL: Hep B Core Total Ab: NEGATIVE

## 2016-05-20 LAB — HEPATITIS B SURFACE ANTIBODY, QUANTITATIVE: Hepatitis B-Post: <3.1 m[IU]/mL — ABNORMAL LOW (ref >9.9)

## 2016-05-20 MED ORDER — METOPROLOL TARTRATE 50 MG PO TABS
ORAL_TABLET | ORAL | Status: AC
  Administered 2016-05-20: 25 mg via ORAL
  Filled 2016-05-20: qty 1

## 2016-05-20 MED ORDER — ALTEPLASE 2 MG IJ SOLR
2.0000 mg | Freq: Once | INTRAMUSCULAR | Status: DC | PRN
  Filled 2016-05-20: qty 2

## 2016-05-20 MED ORDER — AMLODIPINE BESYLATE 5 MG PO TABS: 10.0000 mg | ORAL_TABLET | Freq: Every day | ORAL | Status: DC

## 2016-05-20 MED ORDER — SODIUM CHLORIDE 0.9 % IV SOLN: 100.0000 mL | INTRAVENOUS | Status: DC | PRN

## 2016-05-20 MED ORDER — VANCOMYCIN HCL 10 G IV SOLR
1500.0000 mg | Freq: Once | INTRAVENOUS | Status: AC
  Administered 2016-05-20: 1500 mg via INTRAVENOUS
  Filled 2016-05-20: qty 1500

## 2016-05-20 MED ORDER — ONDANSETRON HCL 4 MG/2ML IJ SOLN
4.0000 mg | Freq: Four times a day (QID) | INTRAMUSCULAR | Status: DC | PRN
  Administered 2016-05-20: 4 mg via INTRAVENOUS
  Filled 2016-05-20: qty 2

## 2016-05-20 MED ORDER — HEPARIN SODIUM (PORCINE) 1000 UNIT/ML IJ SOLN
INTRAMUSCULAR | Status: AC
  Filled 2016-05-20: qty 1

## 2016-05-20 MED ORDER — HYDRALAZINE HCL 25 MG PO TABS
50.0000 mg | ORAL_TABLET | Freq: Three times a day (TID) | ORAL | Status: DC
  Administered 2016-05-20 – 2016-05-22 (×6): 50 mg via ORAL
  Filled 2016-05-20 (×6): qty 2

## 2016-05-20 MED ORDER — HEPARIN SODIUM (PORCINE) 1000 UNIT/ML DIALYSIS
1000.0000 [IU] | INTRAMUSCULAR | Status: DC | PRN
  Filled 2016-05-20: qty 1

## 2016-05-20 MED ORDER — HYDRALAZINE HCL 20 MG/ML IJ SOLN
INTRAMUSCULAR | Status: AC
  Administered 2016-05-20: 10 mg via INTRAVENOUS
  Filled 2016-05-20: qty 1

## 2016-05-20 MED ORDER — DILTIAZEM HCL 60 MG PO TABS
60.0000 mg | ORAL_TABLET | Freq: Three times a day (TID) | ORAL | Status: DC
Start: 2016-05-20 — End: 2016-05-22
  Administered 2016-05-20 – 2016-05-22 (×7): 60 mg via ORAL
  Filled 2016-05-20 (×7): qty 1

## 2016-05-20 MED ORDER — SODIUM CHLORIDE 0.9 % IV SOLN
INTRAVENOUS | Status: AC
  Filled 2016-05-20: qty 1500

## 2016-05-20 NOTE — Progress Notes (Signed)
Subjective: Interval History: has no complaint of difficulty in breathing. His feeling much better. He denies any nausea or vomiting..  Objective: Vital signs in last 24 hours: Temp:  [98.4 F (36.9 C)-101.7 F (38.7 C)] 99.9 F (37.7 C) (01/09 0554) Pulse Rate:  [51-93] 75 (01/09 0647) Resp:  [17-21] 19 (01/08 2239) BP: (151-221)/(75-130) 151/75 (01/09 0647) SpO2:  [92 %-100 %] 97 % (01/09 0945) Weight:  [70.6 kg (155 lb 11.2 oz)-74.8 kg (164 lb 14.5 oz)] 70.6 kg (155 lb 11.2 oz) (01/08 1946) Weight change: -0.043 kg (-1.5 oz)  Intake/Output from previous day: 01/08 0701 - 01/09 0700 In: -  Out: 4000  Intake/Output this shift: Total I/O In: 240 [P.O.:240] Out: -   General appearance: alert, cooperative and no distress Resp: diminished breath sounds bilaterally Cardio: regular rate and rhythm GI: soft, non-tender; bowel sounds normal; no masses,  no organomegaly Extremities: edema No edema  Lab Results:  Recent Labs  05/19/16 0150 05/19/16 0539  WBC 8.2 6.7  HGB 12.3* 9.6*  HCT 38.6* 31.2*  PLT 200 147*   BMET:  Recent Labs  05/19/16 0150 05/19/16 0539  NA 139  --   K 5.2*  --   CL 100*  --   CO2 22  --   GLUCOSE 97  --   BUN 43*  --   CREATININE 9.55* 9.74*  CALCIUM 8.4*  --    No results for input(Sellers): PTH in the last 72 hours. Iron Studies: No results for input(Sellers): IRON, TIBC, TRANSFERRIN, FERRITIN in the last 72 hours.  Studies/Results: Dg Chest Portable 1 View  Result Date: 05/19/2016 CLINICAL DATA:  Shortness of breath beginning about 1.5 hours ago. Missed dialysis on Saturday due to upper respiratory infection symptoms. EXAM: PORTABLE CHEST 1 VIEW COMPARISON:  12/15/2015 FINDINGS: Normal heart size. Pulmonary vascularity is normal. Diffuse interstitial and perihilar airspace disease consistent with edema. Multifocal pneumonia not excluded. No focal consolidation. No blunting of costophrenic angles. No pneumothorax. No blunting of costophrenic angles.  No pneumothorax. Mediastinal contours appear intact. Right-sided central venous catheter with tip over the mid SVC region. IMPRESSION: Perihilar airspace and diffuse interstitial infiltrates likely representing edema but can't exclude multifocal pneumonia in the appropriate clinical setting. No focal consolidation. Electronically Signed   By: Lucienne Capers M.D.   On: 05/19/2016 02:08    I have reviewed the patient'Sellers current medications.  Assessment/Plan: Problem #1 difficulty breathing: Most likely from fluid overload. Patient was dialyzed yesterday with 4 L removal presently he is feeling much better. Problem #2 end-stage renal disease: His status post hemodialysis yesterday. Patient presently does not have any uremic signs or symptoms. Problem #3 hypertension: His blood pressure seems to be somewhat better Problem #4 anemia: His hemoglobin has declined and is below our target goal. Problem #5. Metabolic bone disease: His calcium is in range Problem #6 history of multiple sclerosis Problem #7history of PE    LOS: 1 day   Steven Sellers 05/20/2016,10:13 AM

## 2016-05-20 NOTE — Progress Notes (Signed)
Pharmacy Antibiotic Note  Steven Sellers is a 56 y.o. male admitted on 05/19/2016 with bacteremia.  Pharmacy has been consulted for Vancomycin dosing.  Plan: Vancomycin 1500mg  IV x 1. Monitor labs, micro and vitals.  Pre-Hemodialysis Vancomycin level goal range =15-25 mcg/ml F/U HD schedule plans in AM for further dosing.  Height: 6\' 2"  (188 cm) Weight: 155 lb 10.3 oz (70.6 kg) IBW/kg (Calculated) : 82.2  Temp (24hrs), Avg:99.8 F (37.7 C), Min:98.4 F (36.9 C), Max:101.7 F (38.7 C)   Recent Labs Lab 05/19/16 0150 05/19/16 0539 05/20/16 1137  WBC 8.2 6.7 5.7  CREATININE 9.55* 9.74* 6.88*    Estimated Creatinine Clearance: 12 mL/min (by C-G formula based on SCr of 6.88 mg/dL (H)).    Allergies  Allergen Reactions  . Other Other (See Comments)    IV contrast- Renal issues    Antimicrobials this admission: Vanc 1/9 >>   Dose adjustments this admission: ESRD  Microbiology results: 1/9 BCx: Gram (+) Cocci  Thank you for allowing pharmacy to be a part of this patient's care.  Pricilla Larsson 05/20/2016 6:10 PM

## 2016-05-20 NOTE — Progress Notes (Signed)
PROGRESS NOTE    Steven Sellers  GUY:[id number] DOB: 01-17-1961 DOA: 05/19/2016 PCP: Purvis Kilts, MD    Brief Narrative: Ardean Larsen an 56 y.o.malewith hx of ESRD on HD ( Dr Hinda Lenis, T Th Sat--[name]), Hx of HTN, hx of PE, tobacco abuse, recent AVF revision, Muscular dystrophy, missed dialysis Sat as he was not feeling well, presented to the ER feeling SOB. CXR showed pulmonary edema, cannot exclude multifocal PNA. Admitted for observation for HD. Nephrology consulted.   Assessment & Plan:   Principal Problem:   ESRD needing dialysis ([place]) Active Problems:   Hyperkalemia   Central venous catheter in place   SOB (shortness of breath)   Acute respiratory failure with hypoxia requiring 4 liters of [place] oxygen:  Probably from fluid overload vs atypical pneumonia.  Resolved with HD . Weaned her off  oxygen.  Started on Levaquin for pneumonia.  Febrile on 1/8, wbc count normal.  Repeat CXR ordered showed Improvement in aeration. No definite residual pulmonary edema. Residual streaky atelectasis or infiltrate right infrahilar region   ESRD on HD: Further management as per renal.    Anemia : normocytic. Stable.   Gram positive bacteremia:  Started on IV vancomycin. Await further culture report.   Hypertension: sub optimal.  Resume home meds and started her on hydralazine.    DVT prophylaxis: lovenox.  Code Status: full code.  Family Communication: family at bedside.  Disposition Plan: pending further evaluation.    Consultants:   Nephrology.    Procedures: none.    Antimicrobials: levaquin from 1/8, vancomycin 1/9   Subjective: Breathing improved.   Objective: Vitals:   05/20/16 1334 05/20/16 1400 05/20/16 1430 05/20/16 1435  BP: (!) 200/113 (!) 196/109 (!) 207/107 (!) 188/99  Pulse: 89 97 81 81  Resp:    20  Temp:    99.4 F (37.4 C)  TempSrc:    Oral  SpO2:      Weight:      Height:        Intake/Output Summary (Last 24 hours) at  05/20/16 1524 Last data filed at 05/20/16 1430  Gross per 24 hour  Intake              240 ml  Output             5400 ml  Net            -5160 ml   Filed Weights   05/19/16 1415 05/19/16 1946 05/20/16 1115  Weight: 74.8 kg (164 lb 14.5 oz) 70.6 kg (155 lb 11.2 oz) 70.6 kg (155 lb 10.3 oz)    Examination:  General exam: Appears calm and comfortable  Respiratory system: Clear to auscultation. Respiratory effort normal. Cardiovascular system: S1 & S2 heard, RRR. No JVD, murmurs, rubs, gallops or clicks. No pedal edema. Gastrointestinal system: Abdomen is nondistended, soft and nontender. No organomegaly or masses felt. Normal bowel sounds heard. Central nervous system: Alert and oriented. No focal neurological deficits. Extremities: Symmetric 5 x 5 power. Skin: No rashes, lesions or ulcers     Data Reviewed: I have personally reviewed following labs and imaging studies  CBC:  Recent Labs Lab 05/19/16 0150 05/19/16 0539 05/20/16 1137  WBC 8.2 6.7 5.7  HGB 12.3* 9.6* 11.1*  HCT 38.6* 31.2* 33.3*  MCV 95.5 95.4 93.5  PLT 200 147* 563   Basic Metabolic Panel:  Recent Labs Lab 05/19/16 0150 05/19/16 0539 05/20/16 1137  NA 139  --  133*  K  5.2*  --  4.4  CL 100*  --  93*  CO2 22  --  25  GLUCOSE 97  --  105*  BUN 43*  --  38*  CREATININE 9.55* 9.74* 6.88*  CALCIUM 8.4*  --  8.1*  PHOS  --   --  6.1*   GFR: Estimated Creatinine Clearance: 12 mL/min (by C-G formula based on SCr of 6.88 mg/dL (H)). Liver Function Tests:  Recent Labs Lab 05/20/16 1137  ALBUMIN 3.4*   No results for input(s): LIPASE, AMYLASE in the last 168 hours. No results for input(s): AMMONIA in the last 168 hours. Coagulation Profile: No results for input(s): INR, PROTIME in the last 168 hours. Cardiac Enzymes:  Recent Labs Lab 05/19/16 0150 05/19/16 0539 05/19/16 1111 05/19/16 1732  TROPONINI 0.04* 0.05* 0.06* 0.09*   BNP (last 3 results) No results for input(s): PROBNP in the  last 8760 hours. HbA1C: No results for input(s): HGBA1C in the last 72 hours. CBG: No results for input(s): GLUCAP in the last 168 hours. Lipid Profile: No results for input(s): CHOL, HDL, LDLCALC, TRIG, CHOLHDL, LDLDIRECT in the last 72 hours. Thyroid Function Tests: No results for input(s): TSH, T4TOTAL, FREET4, T3FREE, THYROIDAB in the last 72 hours. Anemia Panel: No results for input(s): VITAMINB12, FOLATE, FERRITIN, TIBC, IRON, RETICCTPCT in the last 72 hours. Sepsis Labs: No results for input(s): PROCALCITON, LATICACIDVEN in the last 168 hours.  Recent Results (from the past 240 hour(s))  Culture, blood (Routine X 2) w Reflex to ID Panel     Status: None (Preliminary result)   Collection Time: 05/19/16  5:42 PM  Result Value Ref Range Status   Specimen Description ARTERY  Final   Special Requests BOTTLES DRAWN AEROBIC AND ANAEROBIC 6CC  Final   Culture NO GROWTH < 24 HOURS  Final   Report Status PENDING  Incomplete  Culture, blood (Routine X 2) w Reflex to ID Panel     Status: None (Preliminary result)   Collection Time: 05/19/16  5:48 PM  Result Value Ref Range Status   Specimen Description BLOOD VENOUS STICK  Final   Special Requests BOTTLES DRAWN AEROBIC AND ANAEROBIC 6CC  Final   Culture NO GROWTH < 24 HOURS  Final   Report Status PENDING  Incomplete         Radiology Studies: Dg Chest Portable 1 View  Result Date: 05/19/2016 CLINICAL DATA:  Shortness of breath beginning about 1.5 hours ago. Missed dialysis on Saturday due to upper respiratory infection symptoms. EXAM: PORTABLE CHEST 1 VIEW COMPARISON:  12/15/2015 FINDINGS: Normal heart size. Pulmonary vascularity is normal. Diffuse interstitial and perihilar airspace disease consistent with edema. Multifocal pneumonia not excluded. No focal consolidation. No blunting of costophrenic angles. No pneumothorax. No blunting of costophrenic angles. No pneumothorax. Mediastinal contours appear intact. Right-sided central  venous catheter with tip over the mid SVC region. IMPRESSION: Perihilar airspace and diffuse interstitial infiltrates likely representing edema but can't exclude multifocal pneumonia in the appropriate clinical setting. No focal consolidation. Electronically Signed   By: Lucienne Capers M.D.   On: 05/19/2016 02:08        Scheduled Meds: . diltiazem  60 mg Oral Q8H  . furosemide  40 mg Oral QPM  . heparin  5,000 Units Subcutaneous Q8H  . hydrALAZINE  50 mg Oral Q8H  . [START ON 05/21/2016] levofloxacin (LEVAQUIN) IV  500 mg Intravenous Q48H  . metoprolol  25 mg Oral Daily  . zolpidem  10 mg Oral QHS  Continuous Infusions:   LOS: 1 day    Time spent: 30 minutes.     Hosie Poisson, MD [facility] Pager [phone]  If 7PM-7AM, please contact night-coverage [email or web address] Password [email or web address] 05/20/2016, 3:24 PM

## 2016-05-20 NOTE — Progress Notes (Signed)
Rec'd called from lab...blood cultures positive for Staphylococcus species and Methicillin resistance. Send text page to MD.

## 2016-05-20 NOTE — Progress Notes (Signed)
CRITICAL VALUE ALERT  Critical value received:  Anerobic Blood Culture growing gram + cocci  Date of notification:  05/20/16  Time of notification: 2353  Critical value read back:Yes.    Nurse who received alert:  Alma Downs, RN  MD notified (1st page):  Dr. Karleen Hampshire  Time of first page:  [phone]  MD notified (2nd page):  Time of second page:  Responding MD:   Time MD responded:

## 2016-05-21 ENCOUNTER — Inpatient Hospital Stay (HOSPITAL_COMMUNITY): Payer: BLUE CROSS/BLUE SHIELD

## 2016-05-21 DIAGNOSIS — Z992 Dependence on renal dialysis: Secondary | ICD-10-CM

## 2016-05-21 DIAGNOSIS — N186 End stage renal disease: Secondary | ICD-10-CM

## 2016-05-21 LAB — URINALYSIS, ROUTINE W REFLEX MICROSCOPIC
BILIRUBIN URINE: NEGATIVE
GLUCOSE, UA: NEGATIVE mg/dL
KETONES UR: NEGATIVE mg/dL
Leukocytes, UA: NEGATIVE
Nitrite: NEGATIVE
PH: 8 (ref 5.0–8.0)
Specific Gravity, Urine: 1.02 (ref 1.005–1.030)

## 2016-05-21 LAB — URINALYSIS, MICROSCOPIC (REFLEX): WBC, UA: NONE SEEN WBC/hpf (ref 0–5)

## 2016-05-21 LAB — INFLUENZA PANEL BY PCR (TYPE A & B)
INFLBPCR: POSITIVE — AB
Influenza A By PCR: NEGATIVE

## 2016-05-21 MED ORDER — VANCOMYCIN HCL IN DEXTROSE 750-5 MG/150ML-% IV SOLN
750.0000 mg | INTRAVENOUS | Status: DC
  Filled 2016-05-21: qty 150

## 2016-05-21 MED ORDER — LABETALOL HCL 5 MG/ML IV SOLN
20.0000 mg | Freq: Once | INTRAVENOUS | Status: AC
  Administered 2016-05-21: 20 mg via INTRAVENOUS
  Filled 2016-05-21: qty 4

## 2016-05-21 NOTE — Progress Notes (Addendum)
Pharmacy Antibiotic Note / BCID note  Steven Sellers is a 56 y.o. male admitted on 05/19/2016 with bacteremia.  Pharmacy has been consulted for Vancomycin dosing.  Plan: Vancomycin 1500mg  IV x 1 then 750mg  IV after each HD Monitor labs, micro and vitals.  Pre-Hemodialysis Vancomycin level goal range =15-25 mcg/ml F/U HD schedule plans for clarification.  Height: 6\' 2"  (188 cm) Weight: 154 lb 6.4 oz (70 kg) IBW/kg (Calculated) : 82.2  Temp (24hrs), Avg:100.4 F (38 C), Min:98.6 F (37 C), Max:102.6 F (39.2 C)   Recent Labs Lab 05/19/16 0150 05/19/16 0539 05/20/16 1137  WBC 8.2 6.7 5.7  CREATININE 9.55* 9.74* 6.88*    Estimated Creatinine Clearance: 11.9 mL/min (by C-G formula based on SCr of 6.88 mg/dL (H)).    Allergies  Allergen Reactions  . Other Other (See Comments)    IV contrast- Renal issues   Antimicrobials this admission: Vanc 1/9 >>  Levaquin 1/10 >>  Dose adjustments this admission: ESRD  Microbiology results: 1/9 BCx: Gram (+) Cocci  Recent Results (from the past 240 hour(s))  Culture, blood (Routine X 2) w Reflex to ID Panel     Status: Abnormal (Preliminary result)   Collection Time: 05/19/16  5:42 PM  Result Value Ref Range Status   Specimen Description BLOOD DIALYSIS CATH SITE  Final   Special Requests BOTTLES DRAWN AEROBIC AND ANAEROBIC 6CC  Final   Culture  Setup Time   Final    GRAM POSITIVE COCCI Performed at [facility] Gram Stain Report Called to,Read Back By and Verified With: DICKERSON,M [id number] ON 1.9.18 BY BAYSE,L ANAEROBIC BOTTLE ONLY CRITICAL RESULT CALLED TO, READ BACK BY AND VERIFIED WITH: [name] 2020 05/20/16 A BROWNING Performed at [place] (COAGULASE NEGATIVE) (A)  Final   Report Status PENDING  Incomplete  Blood Culture ID Panel (Reflexed)     Status: Abnormal   Collection Time: 05/19/16  5:42 PM  Result Value Ref Range Status   Enterococcus species NOT DETECTED  NOT DETECTED Final   Listeria monocytogenes NOT DETECTED NOT DETECTED Final   Staphylococcus species DETECTED (A) NOT DETECTED Final    Comment: CRITICAL RESULT CALLED TO, READ BACK BY AND VERIFIED WITH: [name] 2020 05/20/16 A BROWNING    Staphylococcus aureus NOT DETECTED NOT DETECTED Final   Methicillin resistance DETECTED (A) NOT DETECTED Final    Comment: CRITICAL RESULT CALLED TO, READ BACK BY AND VERIFIED WITH: [name] 2020 05/20/16 A BROWNING    Streptococcus species NOT DETECTED NOT DETECTED Final   Streptococcus agalactiae NOT DETECTED NOT DETECTED Final   Streptococcus pneumoniae NOT DETECTED NOT DETECTED Final   Streptococcus pyogenes NOT DETECTED NOT DETECTED Final   Acinetobacter baumannii NOT DETECTED NOT DETECTED Final   Enterobacteriaceae species NOT DETECTED NOT DETECTED Final   Enterobacter cloacae complex NOT DETECTED NOT DETECTED Final   Escherichia coli NOT DETECTED NOT DETECTED Final   Klebsiella oxytoca NOT DETECTED NOT DETECTED Final   Klebsiella pneumoniae NOT DETECTED NOT DETECTED Final   Proteus species NOT DETECTED NOT DETECTED Final   Serratia marcescens NOT DETECTED NOT DETECTED Final   Haemophilus influenzae NOT DETECTED NOT DETECTED Final   Neisseria meningitidis NOT DETECTED NOT DETECTED Final   Pseudomonas aeruginosa NOT DETECTED NOT DETECTED Final   Candida albicans NOT DETECTED NOT DETECTED Final   Candida glabrata NOT DETECTED NOT DETECTED Final   Candida krusei NOT DETECTED NOT DETECTED Final  Candida parapsilosis NOT DETECTED NOT DETECTED Final   Candida tropicalis NOT DETECTED NOT DETECTED Final    Comment: Performed at [facility]  Culture, blood (Routine X 2) w Reflex to ID Panel     Status: None (Preliminary result)   Collection Time: 05/19/16  5:48 PM  Result Value Ref Range Status   Specimen Description BLOOD VENOUS STICK  Final   Special Requests BOTTLES DRAWN AEROBIC AND ANAEROBIC 6CC  Final   Culture NO GROWTH  2 DAYS  Final   Report Status PENDING  Incomplete   Thank you for allowing pharmacy to be a part of this patient's care.  Hart Robinsons A 05/21/2016 12:36 PM

## 2016-05-21 NOTE — Progress Notes (Signed)
Steven Sellers  MRN: [id number]  DOB/AGE: 01-15-1961 56 y.o.  Primary Care Physician:Steven Sellers, Steven Coder, MD  Admit date: 05/19/2016  Chief Complaint:  Chief Complaint  Patient presents with  . Shortness of Breath    S-Pt presented on  05/19/2016 with  Chief Complaint  Patient presents with  . Shortness of Breath  .    Pt today feels better  Meds . diltiazem  60 mg Oral Q8H  . furosemide  40 mg Oral QPM  . heparin  5,000 Units Subcutaneous Q8H  . hydrALAZINE  50 mg Oral Q8H  . levofloxacin (LEVAQUIN) IV  500 mg Intravenous Q48H  . metoprolol  25 mg Oral Daily  . [START ON 05/22/2016] vancomycin  750 mg Intravenous Q T,Th,Sa-HD  . zolpidem  10 mg Oral QHS      Physical Exam: Vital signs in last 24 hours: Temp:  [98.6 F (37 C)-102.6 F (39.2 C)] 99.1 F (37.3 C) (01/10 0626) Pulse Rate:  [63-102] 76 (01/10 1132) Resp:  [18-20] 18 (01/10 0430) BP: (135-207)/(66-113) 148/66 (01/10 1132) SpO2:  [85 %-100 %] 95 % (01/10 0626) Weight:  [154 lb 6.4 oz (70 kg)] 154 lb 6.4 oz (70 kg) (01/10 0430) Weight change: -9 lb 4.2 oz (-4.2 kg) Last BM Date: 05/19/16  Intake/Output from previous day: 01/09 0701 - 01/10 0700 In: 240 [P.O.:240] Out: 1400  Total I/O In: 240 [P.O.:240] Out: -    Physical Exam: General- pt is awake,alert, oriented to time place and person Resp- No acute REsp distress, CTA B/L NO Rhonchi CVS- S1S2 regular in rate and rhythm GIT- BS+, soft, NT, ND EXT- trace LE Edema, NO Cyanosis Access- right permacath                Left AVF present   Lab Results: CBC  Recent Labs  05/19/16 0539 05/20/16 1137  WBC 6.7 5.7  HGB 9.6* 11.1*  HCT 31.2* 33.3*  PLT 147* 166    BMET  Recent Labs  05/19/16 0150 05/19/16 0539 05/20/16 1137  NA 139  --  133*  K 5.2*  --  4.4  CL 100*  --  93*  CO2 22  --  25  GLUCOSE 97  --  105*  BUN 43*  --  38*  CREATININE 9.55* 9.74* 6.88*  CALCIUM 8.4*  --  8.1*    MICRO Recent Results (from the past 240  hour(s))  Culture, blood (Routine X 2) w Reflex to ID Panel     Status: Abnormal (Preliminary result)   Collection Time: 05/19/16  5:42 PM  Result Value Ref Range Status   Specimen Description BLOOD DIALYSIS CATH SITE  Final   Special Requests BOTTLES DRAWN AEROBIC AND ANAEROBIC 6CC  Final   Culture  Setup Time   Final    GRAM POSITIVE COCCI Performed at [facility] Gram Stain Report Called to,Read Back By and Verified With: Steven Sellers,M [id number] ON 1.9.18 BY Steven Sellers,L ANAEROBIC BOTTLE ONLY CRITICAL RESULT CALLED TO, READ BACK BY AND VERIFIED WITHBarth Kirks RN 2020 05/20/16 A BROWNING Performed at [place] (COAGULASE NEGATIVE) (A)  Final   Report Status PENDING  Incomplete  Blood Culture ID Panel (Reflexed)     Status: Abnormal   Collection Time: 05/19/16  5:42 PM  Result Value Ref Range Status   Enterococcus species NOT DETECTED NOT DETECTED Final   Listeria monocytogenes NOT DETECTED NOT DETECTED Final   Staphylococcus species DETECTED (A)  NOT DETECTED Final    Comment: CRITICAL RESULT CALLED TO, READ BACK BY AND VERIFIED WITH: Steven Kirks RN [id number] 05/20/16 A BROWNING    Staphylococcus aureus NOT DETECTED NOT DETECTED Final   Methicillin resistance DETECTED (A) NOT DETECTED Final    Comment: CRITICAL RESULT CALLED TO, READ BACK BY AND VERIFIED WITH: Steven Kirks RN 2020 05/20/16 A BROWNING    Streptococcus species NOT DETECTED NOT DETECTED Final   Streptococcus agalactiae NOT DETECTED NOT DETECTED Final   Streptococcus pneumoniae NOT DETECTED NOT DETECTED Final   Streptococcus pyogenes NOT DETECTED NOT DETECTED Final   Acinetobacter baumannii NOT DETECTED NOT DETECTED Final   Enterobacteriaceae species NOT DETECTED NOT DETECTED Final   Enterobacter cloacae complex NOT DETECTED NOT DETECTED Final   Escherichia coli NOT DETECTED NOT DETECTED Final   Klebsiella oxytoca NOT DETECTED NOT DETECTED Final   Klebsiella pneumoniae NOT  DETECTED NOT DETECTED Final   Proteus species NOT DETECTED NOT DETECTED Final   Serratia marcescens NOT DETECTED NOT DETECTED Final   Haemophilus influenzae NOT DETECTED NOT DETECTED Final   Neisseria meningitidis NOT DETECTED NOT DETECTED Final   Pseudomonas aeruginosa NOT DETECTED NOT DETECTED Final   Candida albicans NOT DETECTED NOT DETECTED Final   Candida glabrata NOT DETECTED NOT DETECTED Final   Candida krusei NOT DETECTED NOT DETECTED Final   Candida parapsilosis NOT DETECTED NOT DETECTED Final   Candida tropicalis NOT DETECTED NOT DETECTED Final    Comment: Performed at [facility]  Culture, blood (Routine X 2) w Reflex to ID Panel     Status: None (Preliminary result)   Collection Time: 05/19/16  5:48 PM  Result Value Ref Range Status   Specimen Description BLOOD VENOUS STICK  Final   Special Requests BOTTLES DRAWN AEROBIC AND ANAEROBIC 6CC  Final   Culture NO GROWTH 2 DAYS  Final   Report Status PENDING  Incomplete      Lab Results  Component Value Date   CALCIUM 8.1 (L) 05/20/2016   CAION 1.09 (L) 04/28/2016   PHOS 6.1 (H) 05/20/2016   Albumin 3.4 Corrected calcium 8.1+ .4 = 8.5     Impression: 1)Renal  ESRD on HD                Pt is on Tues/Thursday/Saturday schedule                Pt was dialyzed yesterday                NO need of HD today                    2)HTN  Medication- On Diuretics On Calcium Channel Blockers On Beta blockers On Vasodilators-  3)Anemia In ESRD the target for HGb is 9--11     hgb at goal  4)CKD Mineral-Bone Disorder Phosphorus not at goal.      On BInders  Calcium is at goal.( after correcting for low albumin)  5)Fldui overload-admitted with fluid verload Pt had HD Now better  6)Electrolytes  Hyperkalemic   Now better  Hyponatremic   7)Acid base Co2 at goal  8) ID-Pt has blood cultures positive for Coagulase negative    On  IV abx    Will follow    Plan:  Will continue current care. Will  dialyze in  am     Steven Sellers S 05/21/2016, 11:54 AM

## 2016-05-21 NOTE — Progress Notes (Signed)
PROGRESS NOTE                                                                                                                                                                                                             Patient Demographics:    Steven Sellers, is a 56 y.o. male, DOB - 1961/04/09, QZR:[id number]  Admit date - 05/19/2016   Admitting Physician Orvan Falconer, MD  Outpatient Primary MD for the patient is Purvis Kilts, MD  LOS - 2  Chief Complaint  Patient presents with  . Shortness of Breath       Brief Narrative   Steven Sellers is an 56 y.o. male with hx of ESRD on HD ( Dr Hinda Lenis, T Th Sat--[name]), Hx of HTN, hx of PE, tobacco abuse, recent AVF revision, Muscular dystrophy, missed dialysis Sat as he was not feeling well, presented to the ER feeling SOB, was diagnosed with fluid overload due to missed dialysis was diuresed. Now in the hospital for low-grade fevers.   Subjective:    Steven Sellers today has, No headache, No chest pain, No abdominal pain - No Nausea, No new weakness tingling or numbness, No Cough - SOB.     Assessment  & Plan :     1. Acute hypoxic respiratory failure due to fluid overload from missed dialysis. This is not CHF. He was dialyzed axis fluid was removed he is symptom-free now.  2. ESRD. On Tuesday, Thursday and Saturday schedule. Renal following back on schedule.  3. Essential hypertension. Blood pressure is labile, currently stable on combination of hydralazine, diltiazem, beta blocker, will add as needed IV hydralazine.  4. Low-grade fever on 05/20/2016. No clear cause, but cultures appeared to be contaminated with coag-negative staph, he does not have body aches or cough so I doubt he has flu, will check 2 view chest x-ray, UA as he still makes some urine. He was started on Levaquin yesterday which I will continue for now.   Family Communication  :  wife  Code Status :   Full  Diet : Diet renal/carb modified with fluid restriction Diet-HS Snack? Nothing; Room service appropriate? Yes; Fluid consistency: Thin   Disposition Plan  :  Home -1-2 days  Consults  :  Renal  Procedures  :  HD  DVT Prophylaxis  :  Heparin    Lab Results  Component Value Date   PLT 166 05/20/2016    Inpatient Medications  Scheduled Meds: . diltiazem  60 mg Oral Q8H  . furosemide  40 mg Oral QPM  . heparin  5,000 Units Subcutaneous Q8H  . hydrALAZINE  50 mg Oral Q8H  . levofloxacin (LEVAQUIN) IV  500 mg Intravenous Q48H  . metoprolol  25 mg Oral Daily  . [START ON 05/22/2016] vancomycin  750 mg Intravenous Q T,Th,Sa-HD  . zolpidem  10 mg Oral QHS   Continuous Infusions: PRN Meds:.sodium chloride, acetaminophen, alteplase, heparin, heparin, hydrALAZINE, lidocaine (PF), lidocaine-prilocaine, ondansetron (ZOFRAN) IV, pentafluoroprop-tetrafluoroeth, sevelamer carbonate  Antibiotics  :    Anti-infectives    Start     Dose/Rate Route Frequency Ordered Stop   05/22/16 1600  vancomycin (VANCOCIN) IVPB 750 mg/150 ml premix     750 mg 150 mL/hr over 60 Minutes Intravenous Every T-Th-Sa (Hemodialysis) 05/21/16 0744     05/21/16 1630  levofloxacin (LEVAQUIN) IVPB 500 mg     500 mg 100 mL/hr over 60 Minutes Intravenous Every 48 hours 05/19/16 1609     05/20/16 1830  vancomycin (VANCOCIN) 1,500 mg in sodium chloride 0.9 % 500 mL IVPB     1,500 mg 250 mL/hr over 120 Minutes Intravenous  Once 05/20/16 1821 05/20/16 2341   05/19/16 1615  levofloxacin (LEVAQUIN) IVPB 750 mg     750 mg 100 mL/hr over 90 Minutes Intravenous  Once 05/19/16 1613 05/20/16 0853         Objective:   Vitals:   05/21/16 0259 05/21/16 0341 05/21/16 0430 05/21/16 0626  BP: (!) 192/99 (!) 178/93 (!) 142/69 135/74  Pulse: (!) 102  77 72  Resp:   18   Temp: (!) 102 F (38.9 C) (!) 102.6 F (39.2 C) 99.8 F (37.7 C) 99.1 F (37.3 C)  TempSrc: Oral Oral Oral Oral  SpO2: 97%  99% 95%  Weight:   70  kg (154 lb 6.4 oz)   Height:        Wt Readings from Last 3 Encounters:  05/21/16 70 kg (154 lb 6.4 oz)  04/28/16 76.2 kg (168 lb)  01/19/16 78.5 kg (173 lb)     Intake/Output Summary (Last 24 hours) at 05/21/16 0946 Last data filed at 05/21/16 0300  Gross per 24 hour  Intake                0 ml  Output             1400 ml  Net            -1400 ml     Physical Exam  Awake Alert, Oriented X 3, No new F.N deficits, Normal affect [place].AT,PERRAL Supple Neck,No JVD, No cervical lymphadenopathy appriciated.  Symmetrical Chest wall movement, Good air movement bilaterally, CTAB RRR,No Gallops,Rubs or new Murmurs, No Parasternal Heave +ve B.Sounds, Abd Soft, No tenderness, No organomegaly appriciated, No rebound - guarding or rigidity. No Cyanosis, Clubbing or edema, No new Rash or bruise      Data Review:    CBC  Recent Labs Lab 05/19/16 0150 05/19/16 0539 05/20/16 1137  WBC 8.2 6.7 5.7  HGB 12.3* 9.6* 11.1*  HCT 38.6* 31.2* 33.3*  PLT 200 147* 166  MCV 95.5 95.4 93.5  MCH 30.4 29.4 31.2  MCHC 31.9 30.8 33.3  RDW 16.3* 15.7* 15.8*    Chemistries   Recent Labs Lab 05/19/16 0150 05/19/16 0539 05/20/16 1137  NA  139  --  133*  K 5.2*  --  4.4  CL 100*  --  93*  CO2 22  --  25  GLUCOSE 97  --  105*  BUN 43*  --  38*  CREATININE 9.55* 9.74* 6.88*  CALCIUM 8.4*  --  8.1*   ------------------------------------------------------------------------------------------------------------------ No results for input(s): CHOL, HDL, LDLCALC, TRIG, CHOLHDL, LDLDIRECT in the last 72 hours.  Lab Results  Component Value Date   HGBA1C 5.3 12/01/2015   ------------------------------------------------------------------------------------------------------------------ No results for input(s): TSH, T4TOTAL, T3FREE, THYROIDAB in the last 72 hours.  Invalid input(s):  FREET3 ------------------------------------------------------------------------------------------------------------------ No results for input(s): VITAMINB12, FOLATE, FERRITIN, TIBC, IRON, RETICCTPCT in the last 72 hours.  Coagulation profile No results for input(s): INR, PROTIME in the last 168 hours.  No results for input(s): DDIMER in the last 72 hours.  Cardiac Enzymes  Recent Labs Lab 05/19/16 0539 05/19/16 1111 05/19/16 1732  TROPONINI 0.05* 0.06* 0.09*   ------------------------------------------------------------------------------------------------------------------    Component Value Date/Time   BNP 2,072.0 (H) 12/15/2015 2310    Micro Results Recent Results (from the past 240 hour(s))  Culture, blood (Routine X 2) w Reflex to ID Panel     Status: None (Preliminary result)   Collection Time: 05/19/16  5:42 PM  Result Value Ref Range Status   Specimen Description BLOOD DIALYSIS CATH SITE  Final   Special Requests BOTTLES DRAWN AEROBIC AND ANAEROBIC 6CC  Final   Culture  Setup Time   Final    GRAM POSITIVE COCCI Performed at [facility] Gram Stain Report Called to,Read Back By and Verified With: DICKERSON,M [id number] ON 1.9.18 BY BAYSE,L ANAEROBIC BOTTLE ONLY Organism ID to follow CRITICAL RESULT CALLED TO, READ BACK BY AND VERIFIED WITH: [name] 2020 05/20/16 A BROWNING Performed at [facility]    Culture NO GROWTH < 24 HOURS  Final   Report Status PENDING  Incomplete  Blood Culture ID Panel (Reflexed)     Status: Abnormal   Collection Time: 05/19/16  5:42 PM  Result Value Ref Range Status   Enterococcus species NOT DETECTED NOT DETECTED Final   Listeria monocytogenes NOT DETECTED NOT DETECTED Final   Staphylococcus species DETECTED (A) NOT DETECTED Final    Comment: CRITICAL RESULT CALLED TO, READ BACK BY AND VERIFIED WITH: [name] 2020 05/20/16 A BROWNING    Staphylococcus aureus NOT DETECTED NOT DETECTED Final   Methicillin  resistance DETECTED (A) NOT DETECTED Final    Comment: CRITICAL RESULT CALLED TO, READ BACK BY AND VERIFIED WITH: [name] 2020 05/20/16 A BROWNING    Streptococcus species NOT DETECTED NOT DETECTED Final   Streptococcus agalactiae NOT DETECTED NOT DETECTED Final   Streptococcus pneumoniae NOT DETECTED NOT DETECTED Final   Streptococcus pyogenes NOT DETECTED NOT DETECTED Final   Acinetobacter baumannii NOT DETECTED NOT DETECTED Final   Enterobacteriaceae species NOT DETECTED NOT DETECTED Final   Enterobacter cloacae complex NOT DETECTED NOT DETECTED Final   Escherichia coli NOT DETECTED NOT DETECTED Final   Klebsiella oxytoca NOT DETECTED NOT DETECTED Final   Klebsiella pneumoniae NOT DETECTED NOT DETECTED Final   Proteus species NOT DETECTED NOT DETECTED Final   Serratia marcescens NOT DETECTED NOT DETECTED Final   Haemophilus influenzae NOT DETECTED NOT DETECTED Final   Neisseria meningitidis NOT DETECTED NOT DETECTED Final   Pseudomonas aeruginosa NOT DETECTED NOT DETECTED Final   Candida albicans NOT DETECTED NOT DETECTED Final   Candida glabrata NOT DETECTED NOT DETECTED Final  Candida krusei NOT DETECTED NOT DETECTED Final   Candida parapsilosis NOT DETECTED NOT DETECTED Final   Candida tropicalis NOT DETECTED NOT DETECTED Final    Comment: Performed at [facility]  Culture, blood (Routine X 2) w Reflex to ID Panel     Status: None (Preliminary result)   Collection Time: 05/19/16  5:48 PM  Result Value Ref Range Status   Specimen Description BLOOD VENOUS STICK  Final   Special Requests BOTTLES DRAWN AEROBIC AND ANAEROBIC 6CC  Final   Culture NO GROWTH < 24 HOURS  Final   Report Status PENDING  Incomplete    Radiology Reports Dg Chest 2 View  Result Date: 05/20/2016 CLINICAL DATA:  Difficulty breathing EXAM: CHEST  2 VIEW COMPARISON:  05/19/2016 FINDINGS: Cardiomediastinal silhouette is stable. There is improvement in aeration without definite pulmonary edema.  Residual streaky atelectasis or infiltrate right infrahilar region. Stable double lumen right IJ catheter position. IMPRESSION: Improvement in aeration. No definite residual pulmonary edema. Residual streaky atelectasis or infiltrate right infrahilar region. Electronically Signed   By: Lahoma Crocker M.D.   On: 05/20/2016 15:44   [place] Dialysis Access  Result Date: 04/23/2016 CLINICAL DATA:  Difficult access of left radiocephalic AV fistula for dialysis EXAM: ULTRASOUND DIALYSIS ACCESS TECHNIQUE: Multiple grayscale and color Doppler ultrasound images are obtained of the patient's left upper arm AV fistula. COMPARISON:  03/07/2016 FINDINGS: Radial artery proximal to anastomosis is unremarkable. The fistula near the wrist with the cephalic vein is patent. The outflow cephalic vein is patent, with multiple competing branches identified in the forearm, measuring 2.4-3.6 mm diameter. IMPRESSION: 1. Patent left forearm radiocephalic AV fistula. Multiple competing branches from the primary outflow vein in the forearm may limit maturation of the primary outflow. 2. If there is clinical concern of central venous stenosis or occlusion, consider diagnostic fistulogram. Electronically Signed   By: Lucrezia Europe M.D.   On: 04/23/2016 10:35   Dg Chest Portable 1 View  Result Date: 05/19/2016 CLINICAL DATA:  Shortness of breath beginning about 1.5 hours ago. Missed dialysis on Saturday due to upper respiratory infection symptoms. EXAM: PORTABLE CHEST 1 VIEW COMPARISON:  12/15/2015 FINDINGS: Normal heart size. Pulmonary vascularity is normal. Diffuse interstitial and perihilar airspace disease consistent with edema. Multifocal pneumonia not excluded. No focal consolidation. No blunting of costophrenic angles. No pneumothorax. No blunting of costophrenic angles. No pneumothorax. Mediastinal contours appear intact. Right-sided central venous catheter with tip over the mid SVC region. IMPRESSION: Perihilar airspace and diffuse  interstitial infiltrates likely representing edema but can't exclude multifocal pneumonia in the appropriate clinical setting. No focal consolidation. Electronically Signed   By: Lucienne Capers M.D.   On: 05/19/2016 02:08    Time Spent in minutes  30   SINGH,PRASHANT K M.D on 05/21/2016 at 9:46 AM  Between 7am to 7pm - Pager - [phone]  After 7pm go to [email or web address] - password [facility]  [facility] -  Office  [phone]

## 2016-05-22 LAB — CBC
HCT: 32 % — ABNORMAL LOW (ref 39.0–52.0)
Hemoglobin: 10.4 g/dL — ABNORMAL LOW (ref 13.0–17.0)
MCH: 30.4 pg (ref 26.0–34.0)
MCHC: 32.5 g/dL (ref 30.0–36.0)
MCV: 93.6 fL (ref 78.0–100.0)
Platelets: 133 10*3/uL — ABNORMAL LOW (ref 150–400)
RBC: 3.42 MIL/uL — ABNORMAL LOW (ref 4.22–5.81)
RDW: 16 % — AB (ref 11.5–15.5)
WBC: 4 10*3/uL (ref 4.0–10.5)

## 2016-05-22 LAB — CULTURE, BLOOD (ROUTINE X 2)

## 2016-05-22 MED ORDER — OSELTAMIVIR PHOSPHATE 30 MG PO CAPS: 30.0000 mg | ORAL_CAPSULE | ORAL | 0 refills | Status: AC

## 2016-05-22 MED ORDER — OSELTAMIVIR PHOSPHATE 30 MG PO CAPS
30.0000 mg | ORAL_CAPSULE | ORAL | Status: DC
  Filled 2016-05-22: qty 1

## 2016-05-22 MED ORDER — OSELTAMIVIR PHOSPHATE 75 MG PO CAPS: 75.0000 mg | ORAL_CAPSULE | Freq: Two times a day (BID) | ORAL | 0 refills | Status: DC

## 2016-05-22 MED ORDER — OSELTAMIVIR PHOSPHATE 75 MG PO CAPS
75.0000 mg | ORAL_CAPSULE | Freq: Two times a day (BID) | ORAL | Status: DC
Start: 2016-05-22 — End: 2016-05-22

## 2016-05-22 MED ORDER — OSELTAMIVIR PHOSPHATE 30 MG PO CAPS
30.0000 mg | ORAL_CAPSULE | ORAL | Status: AC
  Administered 2016-05-22: 30 mg via ORAL
  Filled 2016-05-22: qty 1

## 2016-05-22 NOTE — Progress Notes (Signed)
Patient with orders to be discharge home. Discharge instructions given, patient verbalized understanding. Prescriptions given. Patient stable. Patient left in private vehicle with wife.

## 2016-05-22 NOTE — Progress Notes (Signed)
Subjective: Interval History: Patient offers no complaints. He denies any difficulty breathing.  Objective: Vital signs in last 24 hours: Temp:  [98.3 F (36.8 C)-98.7 F (37.1 C)] 98.3 F (36.8 C) (01/11 0500) Pulse Rate:  [67-76] 67 (01/11 0500) Resp:  [18-20] 20 (01/11 0500) BP: (128-150)/(66-78) 150/75 (01/11 0500) SpO2:  [91 %-97 %] 91 % (01/11 0500) Weight:  [72.4 kg (159 lb 9.8 oz)] 72.4 kg (159 lb 9.8 oz) (01/11 0500) Weight change: 1.8 kg (3 lb 15.5 oz)  Intake/Output from previous day: 01/10 0701 - 01/11 0700 In: 720 [P.O.:720] Out: -  Intake/Output this shift: No intake/output data recorded.  General appearance: alert, cooperative and no distress Resp: diminished breath sounds bilaterally Cardio: regular rate and rhythm GI: soft, non-tender; bowel sounds normal; no masses,  no organomegaly Extremities: edema No edema  Lab Results:  Recent Labs  05/20/16 1137 05/22/16 0558  WBC 5.7 4.0  HGB 11.1* 10.4*  HCT 33.3* 32.0*  PLT 166 133*   BMET:   Recent Labs  05/20/16 1137  NA 133*  K 4.4  CL 93*  CO2 25  GLUCOSE 105*  BUN 38*  CREATININE 6.88*  CALCIUM 8.1*   No results for input(s): PTH in the last 72 hours. Iron Studies: No results for input(s): IRON, TIBC, TRANSFERRIN, FERRITIN in the last 72 hours.  Studies/Results: Dg Chest 2 View  Result Date: 05/21/2016 CLINICAL DATA:  Shortness of breath, low-grade fever EXAM: CHEST  2 VIEW COMPARISON:  05/20/2016 FINDINGS: Right dialysis catheter remains in place, unchanged. Heart and mediastinal contours are within normal limits. No focal opacities or effusions. No acute bony abnormality. IMPRESSION: No active cardiopulmonary disease. Electronically Signed   By: Rolm Baptise M.D.   On: 05/21/2016 10:51   Dg Chest 2 View  Result Date: 05/20/2016 CLINICAL DATA:  Difficulty breathing EXAM: CHEST  2 VIEW COMPARISON:  05/19/2016 FINDINGS: Cardiomediastinal silhouette is stable. There is improvement in  aeration without definite pulmonary edema. Residual streaky atelectasis or infiltrate right infrahilar region. Stable double lumen right IJ catheter position. IMPRESSION: Improvement in aeration. No definite residual pulmonary edema. Residual streaky atelectasis or infiltrate right infrahilar region. Electronically Signed   By: Lahoma Crocker M.D.   On: 05/20/2016 15:44    I have reviewed the patient's current medications.  Assessment/Plan: Problem #1 difficulty breathing: Patient has improved significantly after dialysis. Presently seems to be more comfortable. Problem #2 end-stage renal disease: His status post hemodialysis on Tuesday. Presently his potassium is normal and he doesn't have any uremic signs and  symptoms. Problem #3 hypertension: His blood pressure seems to be somewhat better Problem #4 anemia: His hemoglobin is stable. Presently is within our target goal. Problem #5. Metabolic bone disease: His calcium is in range but his phosphorus is high. Presently he is on a binder. Problem #6 history of multiple sclerosis Problem #7history of PE  Problem #8 blood culture positive for gram positive cocci. Presently is on antibiotics. Plan: We'll make arrangements for patient to get dialysis today We'll check his renal panel in the morning. We'll continue his other treatment as before.   LOS: 3 days   Delano Frate S 05/22/2016,9:58 AM

## 2016-05-22 NOTE — Discharge Instructions (Signed)
Follow with Primary MD Purvis Kilts, MD in 7 days   Get CBC, CMP, 2 view Chest X ray checked  by Primary MD or SNF MD in 5-7 days ( we routinely change or add medications that can affect your baseline labs and fluid status, therefore we recommend that you get the mentioned basic workup next visit with your PCP, your PCP may decide not to get them or add new tests based on their clinical decision)   Activity: As tolerated with Full fall precautions use walker/cane & assistance as needed   Disposition Home     Diet:   Diet renal/carb modified with fluid restriction  .   Check your Weight same time everyday, if you gain over 2 pounds, or you develop in leg swelling, experience more shortness of breath or chest pain, call your Primary MD immediately. Follow Cardiac Low Salt Diet and 1.5 lit/day fluid restriction.   On your next visit with your primary care physician please Get Medicines reviewed and adjusted.   Please request your Prim.MD to go over all Hospital Tests and Procedure/Radiological results at the follow up, please get all Hospital records sent to your Prim MD by signing hospital release before you go home.   If you experience worsening of your admission symptoms, develop shortness of breath, life threatening emergency, suicidal or homicidal thoughts you must seek medical attention immediately by calling 911 or calling your MD immediately  if symptoms less severe.  You Must read complete instructions/literature along with all the possible adverse reactions/side effects for all the Medicines you take and that have been prescribed to you. Take any new Medicines after you have completely understood and accpet all the possible adverse reactions/side effects.   Do not drive, operate heavy machinery, perform activities at heights, swimming or participation in water activities or provide baby sitting services if your were admitted for syncope or siezures until you have seen by  Primary MD or a Neurologist and advised to do so again.  Do not drive when taking Pain medications.    Do not take more than prescribed Pain, Sleep and Anxiety Medications  Special Instructions: If you have smoked or chewed Tobacco  in the last 2 yrs please stop smoking, stop any regular Alcohol  and or any Recreational drug use.  Wear Seat belts while driving.   Please note  You were cared for by a hospitalist during your hospital stay. If you have any questions about your discharge medications or the care you received while you were in the hospital after you are discharged, you can call the unit and asked to speak with the hospitalist on call if the hospitalist that took care of you is not available. Once you are discharged, your primary care physician will handle any further medical issues. Please note that NO REFILLS for any discharge medications will be authorized once you are discharged, as it is imperative that you return to your primary care physician (or establish a relationship with a primary care physician if you do not have one) for your aftercare needs so that they can reassess your need for medications and monitor your lab values.

## 2016-05-22 NOTE — Discharge Summary (Addendum)
Steven Sellers TGY:[id number] DOB: 01/28/1961 DOA: 05/19/2016  PCP: Steven Kilts, MD  Admit date: 05/19/2016  Discharge date: 05/22/2016  Admitted From: Home   Disposition:  Home   Recommendations for Outpatient Follow-up:   Follow up with PCP in 1-2 weeks  PCP Please obtain BMP/CBC, 2 view CXR in 1week,  (see Discharge instructions)   PCP Please follow up on the following pending results: None   Home Health: None   Equipment/Devices: None  Consultations: Renal Discharge Condition: Stable   CODE STATUS: Full   Diet Recommendation: Diet renal/carb modified with 1.5 L per day fluid restriction    Chief Complaint  Patient presents with  . Shortness of Breath     Brief history of present illness from the day of admission and additional interim summary    Steven Sellers an 56 y.o.malewith hx of ESRD on HD ( Dr Hinda Lenis, T Th Sat--[name]), Hx of HTN, hx of PE, tobacco abuse, recent AVF revision, Muscular dystrophy, missed dialysis Sat as he was not feeling well, presented to the ER feeling SOB, was diagnosed with fluid overload due to missed dialysis was diuresed. Now in the hospital for low-grade fevers.  Hospital issues addressed     1. Acute hypoxic respiratory failure due to fluid overload from missed dialysis. This is not CHF. He was dialyzed extra fluid was removed he is symptom-free now.  2. ESRD. On Tuesday, Thursday and Saturday schedule. Renal following back on schedule.  3. Essential hypertension. Blood pressure is labile, currently stable on combination of hydralazine, diltiazem, beta blocker, follow with PCP.  4. Low-grade fever on 05/20/2016. Due to FLU, he started Tamiflu stable, 1 out of 2 blood cultures showed contamination with coag-negative staph.   Discharge diagnosis      Principal Problem:   ESRD needing dialysis [facility]) Active Problems:   Hyperkalemia   Central venous catheter in place   SOB (shortness of breath)    Discharge instructions    Discharge Instructions    Discharge instructions    Complete by:  As directed    Follow with Primary MD Steven Kilts, MD in 7 days   Get CBC, CMP, 2 view Chest X ray checked  by Primary MD or SNF MD in 5-7 days ( we routinely change or add medications that can affect your baseline labs and fluid status, therefore we recommend that you get the mentioned basic workup next visit with your PCP, your PCP may decide not to get them or add new tests based on their clinical decision)   Activity: As tolerated with Full fall precautions use walker/cane & assistance as needed   Disposition Home     Diet:   Diet renal/carb modified with fluid restriction  .   Check your Weight same time everyday, if you gain over 2 pounds, or you develop in leg swelling, experience more shortness of breath or chest pain, call your Primary MD immediately. Follow Cardiac Low Salt Diet and 1.5 lit/day fluid restriction.   On your  next visit with your primary care physician please Get Medicines reviewed and adjusted.   Please request your Prim.MD to go over all Hospital Tests and Procedure/Radiological results at the follow up, please get all Hospital records sent to your Prim MD by signing hospital release before you go home.   If you experience worsening of your admission symptoms, develop shortness of breath, life threatening emergency, suicidal or homicidal thoughts you must seek medical attention immediately by calling 911 or calling your MD immediately  if symptoms less severe.  You Must read complete instructions/literature along with all the possible adverse reactions/side effects for all the Medicines you take and that have been prescribed to you. Take any new Medicines after you have completely understood and accpet  all the possible adverse reactions/side effects.   Do not drive, operate heavy machinery, perform activities at heights, swimming or participation in water activities or provide baby sitting services if your were admitted for syncope or siezures until you have seen by Primary MD or a Neurologist and advised to do so again.  Do not drive when taking Pain medications.    Do not take more than prescribed Pain, Sleep and Anxiety Medications  Special Instructions: If you have smoked or chewed Tobacco  in the last 2 yrs please stop smoking, stop any regular Alcohol  and or any Recreational drug use.  Wear Seat belts while driving.   Please note  You were cared for by a hospitalist during your hospital stay. If you have any questions about your discharge medications or the care you received while you were in the hospital after you are discharged, you can call the unit and asked to speak with the hospitalist on call if the hospitalist that took care of you is not available. Once you are discharged, your primary care physician will handle any further medical issues. Please note that NO REFILLS for any discharge medications will be authorized once you are discharged, as it is imperative that you return to your primary care physician (or establish a relationship with a primary care physician if you do not have one) for your aftercare needs so that they can reassess your need for medications and monitor your lab values.   Increase activity slowly    Complete by:  As directed       Discharge Medications   Allergies as of 05/22/2016      Reactions   Other Other (See Comments)   IV contrast- Renal issues      Medication List    TAKE these medications   acetaminophen 500 MG tablet Commonly known as:  TYLENOL Take 1,000 mg by mouth every 6 (six) hours as needed for mild pain or moderate pain.   diltiazem 180 MG 24 hr capsule Commonly known as:  TIAZAC Take 180 mg by mouth at bedtime.    furosemide 20 MG tablet Commonly known as:  LASIX Take 40 mg by mouth every evening.   metoprolol 50 MG tablet Commonly known as:  LOPRESSOR Take 25 mg by mouth at bedtime.   oseltamivir 30 MG capsule Commonly known as:  TAMIFLU Take 1 capsule (30 mg total) by mouth Every Tuesday,Thursday,and Saturday with dialysis.   sevelamer carbonate 2.4 g Pack Commonly known as:  RENVELA Take 2.4 g by mouth See admin instructions. Takes only if eating Dairy products (1 pack per 2 oz of water)       Follow-up Information    Steven Kilts, MD. Schedule an appointment as soon  as possible for a visit in 1 week(s).   Specialty:  Family Medicine Contact information: [address] [place]  [address] [phone]           Major procedures and Radiology Reports - PLEASE review detailed and final reports thoroughly  -         Dg Chest 2 View  Result Date: 05/21/2016 CLINICAL DATA:  Shortness of breath, low-grade fever EXAM: CHEST  2 VIEW COMPARISON:  05/20/2016 FINDINGS: Right dialysis catheter remains in place, unchanged. Heart and mediastinal contours are within normal limits. No focal opacities or effusions. No acute bony abnormality. IMPRESSION: No active cardiopulmonary disease. Electronically Signed   By: Rolm Baptise M.D.   On: 05/21/2016 10:51   Dg Chest 2 View  Result Date: 05/20/2016 CLINICAL DATA:  Difficulty breathing EXAM: CHEST  2 VIEW COMPARISON:  05/19/2016 FINDINGS: Cardiomediastinal silhouette is stable. There is improvement in aeration without definite pulmonary edema. Residual streaky atelectasis or infiltrate right infrahilar region. Stable double lumen right IJ catheter position. IMPRESSION: Improvement in aeration. No definite residual pulmonary edema. Residual streaky atelectasis or infiltrate right infrahilar region. Electronically Signed   By: Lahoma Crocker M.D.   On: 05/20/2016 15:44   [place] Dialysis Access  Result Date: 04/23/2016 CLINICAL DATA:   Difficult access of left radiocephalic AV fistula for dialysis EXAM: ULTRASOUND DIALYSIS ACCESS TECHNIQUE: Multiple grayscale and color Doppler ultrasound images are obtained of the patient's left upper arm AV fistula. COMPARISON:  03/07/2016 FINDINGS: Radial artery proximal to anastomosis is unremarkable. The fistula near the wrist with the cephalic vein is patent. The outflow cephalic vein is patent, with multiple competing branches identified in the forearm, measuring 2.4-3.6 mm diameter. IMPRESSION: 1. Patent left forearm radiocephalic AV fistula. Multiple competing branches from the primary outflow vein in the forearm may limit maturation of the primary outflow. 2. If there is clinical concern of central venous stenosis or occlusion, consider diagnostic fistulogram. Electronically Signed   By: Lucrezia Europe M.D.   On: 04/23/2016 10:35   Dg Chest Portable 1 View  Result Date: 05/19/2016 CLINICAL DATA:  Shortness of breath beginning about 1.5 hours ago. Missed dialysis on Saturday due to upper respiratory infection symptoms. EXAM: PORTABLE CHEST 1 VIEW COMPARISON:  12/15/2015 FINDINGS: Normal heart size. Pulmonary vascularity is normal. Diffuse interstitial and perihilar airspace disease consistent with edema. Multifocal pneumonia not excluded. No focal consolidation. No blunting of costophrenic angles. No pneumothorax. No blunting of costophrenic angles. No pneumothorax. Mediastinal contours appear intact. Right-sided central venous catheter with tip over the mid SVC region. IMPRESSION: Perihilar airspace and diffuse interstitial infiltrates likely representing edema but can't exclude multifocal pneumonia in the appropriate clinical setting. No focal consolidation. Electronically Signed   By: Lucienne Capers M.D.   On: 05/19/2016 02:08    Micro Results     Recent Results (from the past 240 hour(s))  Culture, blood (Routine X 2) w Reflex to ID Panel     Status: Abnormal   Collection Time: 05/19/16  5:42  PM  Result Value Ref Range Status   Specimen Description BLOOD DIALYSIS CATH SITE  Final   Special Requests BOTTLES DRAWN AEROBIC AND ANAEROBIC 6CC  Final   Culture  Setup Time   Final    GRAM POSITIVE COCCI Performed at [facility] Gram Stain Report Called to,Read Back By and Verified With: DICKERSON,M [id number] ON 1.9.18 BY [place],L ANAEROBIC BOTTLE ONLY CRITICAL RESULT CALLED TO, READ BACK BY AND VERIFIED WITHBarth Kirks RN [phone] 05/20/16  A BROWNING    Culture (A)  Final    STAPHYLOCOCCUS SPECIES (COAGULASE NEGATIVE) THE SIGNIFICANCE OF ISOLATING THIS ORGANISM FROM A SINGLE SET OF BLOOD CULTURES WHEN MULTIPLE SETS ARE DRAWN IS UNCERTAIN. PLEASE NOTIFY THE MICROBIOLOGY DEPARTMENT WITHIN ONE WEEK IF SPECIATION AND SENSITIVITIES ARE REQUIRED. Performed at [facility]    Report Status 05/22/2016 FINAL  Final  Blood Culture ID Panel (Reflexed)     Status: Abnormal   Collection Time: 05/19/16  5:42 PM  Result Value Ref Range Status   Enterococcus species NOT DETECTED NOT DETECTED Final   Listeria monocytogenes NOT DETECTED NOT DETECTED Final   Staphylococcus species DETECTED (A) NOT DETECTED Final    Comment: CRITICAL RESULT CALLED TO, READ BACK BY AND VERIFIED WITH: [name] 2020 05/20/16 A BROWNING    Staphylococcus aureus NOT DETECTED NOT DETECTED Final   Methicillin resistance DETECTED (A) NOT DETECTED Final    Comment: CRITICAL RESULT CALLED TO, READ BACK BY AND VERIFIED WITH: [name] 2020 05/20/16 A BROWNING    Streptococcus species NOT DETECTED NOT DETECTED Final   Streptococcus agalactiae NOT DETECTED NOT DETECTED Final   Streptococcus pneumoniae NOT DETECTED NOT DETECTED Final   Streptococcus pyogenes NOT DETECTED NOT DETECTED Final   Acinetobacter baumannii NOT DETECTED NOT DETECTED Final   Enterobacteriaceae species NOT DETECTED NOT DETECTED Final   Enterobacter cloacae complex NOT DETECTED NOT DETECTED Final   Escherichia coli NOT DETECTED NOT  DETECTED Final   Klebsiella oxytoca NOT DETECTED NOT DETECTED Final   Klebsiella pneumoniae NOT DETECTED NOT DETECTED Final   Proteus species NOT DETECTED NOT DETECTED Final   Serratia marcescens NOT DETECTED NOT DETECTED Final   Haemophilus influenzae NOT DETECTED NOT DETECTED Final   Neisseria meningitidis NOT DETECTED NOT DETECTED Final   Pseudomonas aeruginosa NOT DETECTED NOT DETECTED Final   Candida albicans NOT DETECTED NOT DETECTED Final   Candida glabrata NOT DETECTED NOT DETECTED Final   Candida krusei NOT DETECTED NOT DETECTED Final   Candida parapsilosis NOT DETECTED NOT DETECTED Final   Candida tropicalis NOT DETECTED NOT DETECTED Final    Comment: Performed at [facility]  Culture, blood (Routine X 2) w Reflex to ID Panel     Status: None (Preliminary result)   Collection Time: 05/19/16  5:48 PM  Result Value Ref Range Status   Specimen Description BLOOD VENOUS STICK  Final   Special Requests BOTTLES DRAWN AEROBIC AND ANAEROBIC [place]  Final   Culture NO GROWTH 3 DAYS  Final   Report Status PENDING  Incomplete    Today   Subjective    Dillyn Menna today has no headache,no chest abdominal pain,no new weakness tingling or numbness, feels much better wants to go home today    Objective   Blood pressure (!) 150/75, pulse 67, temperature 98.3 F (36.8 C), temperature source Oral, resp. rate 20, height 6\' 2"  (1.88 m), weight 72.4 kg (159 lb 9.8 oz), SpO2 91 %.   Intake/Output Summary (Last 24 hours) at 05/22/16 1057 Last data filed at 05/22/16 0900  Gross per 24 hour  Intake              720 ml  Output                0 ml  Net              720 ml    Exam Awake Alert, Oriented x 3, No new F.N deficits, Normal affect [place].AT,PERRAL  Supple Neck,No JVD, No cervical lymphadenopathy appriciated.  Symmetrical Chest wall movement, Good air movement bilaterally, CTAB RRR,No Gallops,Rubs or new Murmurs, No Parasternal Heave +ve B.Sounds, Abd Soft, Non tender, No  organomegaly appriciated, No rebound -guarding or rigidity. No Cyanosis, Clubbing or edema, No new Rash or bruise   Data Review   CBC w Diff:  Lab Results  Component Value Date   WBC 4.0 05/22/2016   HGB 10.4 (L) 05/22/2016   HCT 32.0 (L) 05/22/2016   HCT 29.3 (L) 12/01/2015   PLT 133 (L) 05/22/2016   LYMPHOPCT 18 12/15/2015   MONOPCT 10 12/15/2015   EOSPCT 3 12/15/2015   BASOPCT 1 12/15/2015    CMP:  Lab Results  Component Value Date   NA 133 (L) 05/20/2016   K 4.4 05/20/2016   CL 93 (L) 05/20/2016   CO2 25 05/20/2016   BUN 38 (H) 05/20/2016   CREATININE 6.88 (H) 05/20/2016   PROT 5.7 (L) 12/15/2015   ALBUMIN 3.4 (L) 05/20/2016   BILITOT 0.4 12/15/2015   ALKPHOS 67 12/15/2015   AST 19 12/15/2015   ALT 11 (L) 12/15/2015  .   Total Time in preparing paper work, data evaluation and todays exam - 35 minutes  Thurnell Lose M.D on 05/22/2016 at 10:57 AM  [facility]   Office  [phone]

## 2016-05-22 NOTE — Progress Notes (Signed)
PHARMACY NOTE:  ANTIMICROBIAL RENAL DOSAGE ADJUSTMENT  Current antimicrobial regimen includes a mismatch between antimicrobial dosage and estimated renal function.  As per policy approved by the Pharmacy & Therapeutics and Medical Executive Committees, the antimicrobial dosage will be adjusted accordingly.  Current antimicrobial dosage:  Tamiflu 75mg  po BID  Indication: flu  Renal Function:  ESRD requiring dialysis on T-Th-Sat  Estimated Creatinine Clearance: 12.3 mL/min (by C-G formula based on SCr of 6.88 mg/dL (H)). [x]      On intermittent HD, scheduled: []      On CRRT    Antimicrobial dosage has been changed to:  30mg  after each dialysis starting today x 5 days total  Additional comments:  Pt will get initial dose today then be treated x 5 days  Thank you for allowing pharmacy to be a part of this patient's care.  Ena Dawley, [facility] 05/22/2016 10:48 AM

## 2016-05-23 LAB — URINE CULTURE

## 2016-05-25 LAB — CULTURE, BLOOD (ROUTINE X 2): Culture: NO GROWTH

## 2016-06-19 ENCOUNTER — Other Ambulatory Visit: Payer: Self-pay

## 2016-06-19 DIAGNOSIS — N179 Acute kidney failure, unspecified: Secondary | ICD-10-CM

## 2016-06-20 ENCOUNTER — Encounter: Payer: Self-pay | Admitting: Vascular Surgery

## 2016-06-20 ENCOUNTER — Other Ambulatory Visit: Payer: Self-pay

## 2016-06-20 ENCOUNTER — Ambulatory Visit (INDEPENDENT_AMBULATORY_CARE_PROVIDER_SITE_OTHER): Payer: BLUE CROSS/BLUE SHIELD | Admitting: Vascular Surgery

## 2016-06-20 ENCOUNTER — Ambulatory Visit (INDEPENDENT_AMBULATORY_CARE_PROVIDER_SITE_OTHER)
Admission: RE | Admit: 2016-06-20 | Discharge: 2016-06-20 | Disposition: A | Discharge status: Home / Self Care | Payer: BLUE CROSS/BLUE SHIELD | Source: Ambulatory Visit | Attending: Vascular Surgery | Admitting: Vascular Surgery

## 2016-06-20 ENCOUNTER — Ambulatory Visit (HOSPITAL_COMMUNITY)
Admission: RE | Admit: 2016-06-20 | Discharge: 2016-06-20 | Disposition: A | Discharge status: Home / Self Care | Payer: BLUE CROSS/BLUE SHIELD | Source: Ambulatory Visit | Attending: Vascular Surgery | Admitting: Vascular Surgery

## 2016-06-20 VITALS — BP 183/96 | HR 47 | Temp 97.2°F | Resp 16 | Ht 74.0 in | Wt 162.0 lb

## 2016-06-20 DIAGNOSIS — N179 Acute kidney failure, unspecified: Secondary | ICD-10-CM | POA: Diagnosis not present

## 2016-06-20 DIAGNOSIS — N186 End stage renal disease: Secondary | ICD-10-CM | POA: Diagnosis not present

## 2016-06-20 DIAGNOSIS — Z992 Dependence on renal dialysis: Secondary | ICD-10-CM | POA: Diagnosis not present

## 2016-06-20 NOTE — Progress Notes (Signed)
Patient ID: Steven Sellers, male   DOB: 08-Feb-1961, 56 y.o.   MRN: [id number]  Reason for Consult: New Evaluation (New access evaluation. Referred by Dr. Lowanda Foster [phone].)   Referred by Fran Lowes, MD  Subjective:     HPI:  Steven Sellers is a 56 y.o. male with history of end-stage renal disease on dialysis Tuesdays and Thursdays and Saturdays via right IJ tunneled dialysis catheter. Last July he had placement of left radiocephalic AV fistula which has failed to mature. He did have ligation of side branches. He states he does have palpable thrill however the dialysis nurses cannot stick it. He is not having left hand pain. He has a chronically cold although this is not changed with his access being placed. His hand is fully functional on the left side. He has not had any other issues at this time other than discomfort and difficulty swallowing with his tunneled catheter in place.  Past Medical History:  Diagnosis Date  . Acute renal failure (ARF) (HCC)    Several episodes of HD in 2012  . Alcohol use   . Chronic kidney disease   . Hypertension   . Muscular dystrophy ([place])   . Pulmonary embolism ([place]) 2011  . Tobacco use   . Vision abnormalities    Family History  Problem Relation Age of Onset  . Hypertension Mother   . Heart attack Father    Past Surgical History:  Procedure Laterality Date  . AV FISTULA PLACEMENT Left 12/26/2015   Procedure: CREATION OF LEFT RADIAL-CEPHALIC ARTERIOVENOUS FISTULA FOR HEMODIALYSIS;  Surgeon: Vickie Epley, MD;  Location: [facility];  Service: Vascular;  Laterality: Left;  . AV FISTULA PLACEMENT Left 04/28/2016   Procedure: REVISION OF LEFT RADIAL CEPHALIC ARTERIOVENOUS (AV) FISTULA  FOR DURABLE HEMODIALYSIS;  Surgeon: Vickie Epley, MD;  Location: [facility];  Service: Vascular;  Laterality: Left;  . CARDIAC CATHETERIZATION  2002   normal coronary arteries  . CENTRAL VENOUS CATHETER INSERTION Right 12/04/2015   Procedure: INSERTION OF  TUNNELED HEMODIALYSIS CATHETER RIGHT INTERNAL JUGULAR;  Surgeon: Vickie Epley, MD;  Location: [facility];  Service: General;  Laterality: Right;    Short Social History:  Social History  Substance Use Topics  . Smoking status: Current Every Day Smoker    Packs/day: 0.50    Types: Cigarettes    Start date: 05/12/1974  . Smokeless tobacco: Never Used     Comment: 1/2 pk per day  . Alcohol use 14.4 oz/week    24 Standard drinks or equivalent per week     Comment: occ    Allergies  Allergen Reactions  . Other Other (See Comments)    IV contrast- Renal issues    Current Outpatient Prescriptions  Medication Sig Dispense Refill  . acetaminophen (TYLENOL) 500 MG tablet Take 1,000 mg by mouth every 6 (six) hours as needed for mild pain or moderate pain.    Marland Kitchen amLODipine (NORVASC) 5 MG tablet Take 5 mg by mouth daily.    . furosemide (LASIX) 20 MG tablet Take 40 mg by mouth every evening.    . metoprolol (LOPRESSOR) 50 MG tablet Take 25 mg by mouth at bedtime.     . sevelamer carbonate (RENVELA) 2.4 g PACK Take 2.4 g by mouth See admin instructions. Takes only if eating Dairy products (1 pack per 2 oz of water)  0   No current facility-administered medications for this visit.     Review of Systems  Constitutional:  Constitutional negative. HENT: Positive for trouble swallowing.  Eyes: Eyes negative.  Respiratory: Respiratory negative.  Cardiovascular: Cardiovascular negative.  GI: Gastrointestinal negative.  Musculoskeletal: Musculoskeletal negative.  Skin: Skin negative.  Neurological: Neurological negative. Hematologic: Hematologic/lymphatic negative.  Psychiatric: Psychiatric negative.        Objective:  Objective   Vitals:   06/20/16 1000  BP: (!) 183/96  Pulse: (!) 47  Resp: 16  Temp: 97.2 F (36.2 C)  TempSrc: Oral  SpO2: 100%  Weight: 162 lb (73.5 kg)  Height: 6\' 2"  (1.88 m)   Body mass index is 20.8 kg/m.  Physical Exam  Constitutional: He is oriented to  person, place, and time. He appears well-developed.  HENT:  Head: Normocephalic.  Eyes: EOM are normal.  Neck:  tdc in place  Cardiovascular: Normal rate.   Palpable left radial pulse  Pulmonary/Chest: Effort normal.  Abdominal: Soft.  Musculoskeletal: Normal range of motion. He exhibits no edema.  Left arm radiocephalic avf with palpable thrill  Neurological: He is alert and oriented to person, place, and time.  Skin: Skin is dry.  Psychiatric: He has a normal mood and affect. His behavior is normal. Thought content normal.    Data: Left upper arm with satisfactory cephalic vein for AV fistula. His brachial artery is 0.63 cm at the antecubital fossa with a triphasic waveform.     Assessment/Plan:    56yo male on dialysis via right IJ tunneled dialysis catheter. He has a left radial cephalic AV fistula which is failed to mature despite ligation of side branches. At this time I think it is prudent for a antecubital fossa cephalic vein turndown. He agrees with the plan continue dialysis via tunnel catheter and we will get him set up soon.     Waynetta Sandy MD [facility]

## 2016-06-24 ENCOUNTER — Encounter: Payer: Self-pay | Admitting: Nephrology

## 2016-07-03 ENCOUNTER — Encounter (HOSPITAL_COMMUNITY): Payer: Self-pay | Admitting: *Deleted

## 2016-07-06 NOTE — Anesthesia Preprocedure Evaluation (Addendum)
Anesthesia Evaluation  Patient identified by MRN, date of birth, ID band Patient awake    Reviewed: Allergy & Precautions, NPO status , Patient's Chart, lab work & pertinent test results  Airway Mallampati: II  TM Distance: >3 FB Neck ROM: Full    Dental  (+) Dental Advisory Given   Pulmonary Current Smoker,    breath sounds clear to auscultation       Cardiovascular hypertension, Pt. on medications and Pt. on home beta blockers  Rhythm:Regular Rate:Normal     Neuro/Psych  Neuromuscular disease    GI/Hepatic negative GI ROS, Neg liver ROS,   Endo/Other  negative endocrine ROS  Renal/GU ESRFRenal disease     Musculoskeletal   Abdominal   Peds  Hematology negative hematology ROS (+)   Anesthesia Other Findings   Reproductive/Obstetrics                            Anesthesia Physical Anesthesia Plan  ASA: III  Anesthesia Plan: MAC   Post-op Pain Management:    Induction: Intravenous  Airway Management Planned: Natural Airway and Simple Face Mask  Additional Equipment:   Intra-op Plan:   Post-operative Plan:   Informed Consent: I have reviewed the patients History and Physical, chart, labs and discussed the procedure including the risks, benefits and alternatives for the proposed anesthesia with the patient or authorized representative who has indicated his/her understanding and acceptance.   Dental advisory given  Plan Discussed with:   Anesthesia Plan Comments:        Anesthesia Quick Evaluation

## 2016-07-07 ENCOUNTER — Encounter (HOSPITAL_COMMUNITY): Payer: Self-pay | Admitting: Certified Registered Nurse Anesthetist

## 2016-07-07 ENCOUNTER — Ambulatory Visit (HOSPITAL_COMMUNITY): Payer: BLUE CROSS/BLUE SHIELD | Admitting: Anesthesiology

## 2016-07-07 ENCOUNTER — Ambulatory Visit (HOSPITAL_COMMUNITY)
Admission: RE | Admit: 2016-07-07 | Discharge: 2016-07-07 | Disposition: A | Discharge status: Home / Self Care | Payer: BLUE CROSS/BLUE SHIELD | Source: Ambulatory Visit | Attending: Vascular Surgery | Admitting: Vascular Surgery

## 2016-07-07 ENCOUNTER — Encounter (HOSPITAL_COMMUNITY)
Admission: RE | Disposition: A | Discharge status: Home / Self Care | Payer: Self-pay | Source: Ambulatory Visit | Attending: Vascular Surgery

## 2016-07-07 ENCOUNTER — Other Ambulatory Visit: Payer: Self-pay | Admitting: *Deleted

## 2016-07-07 DIAGNOSIS — Z79899 Other long term (current) drug therapy: Secondary | ICD-10-CM | POA: Diagnosis not present

## 2016-07-07 DIAGNOSIS — N186 End stage renal disease: Secondary | ICD-10-CM | POA: Diagnosis not present

## 2016-07-07 DIAGNOSIS — F17200 Nicotine dependence, unspecified, uncomplicated: Secondary | ICD-10-CM | POA: Insufficient documentation

## 2016-07-07 DIAGNOSIS — G710 Muscular dystrophy: Secondary | ICD-10-CM | POA: Diagnosis not present

## 2016-07-07 DIAGNOSIS — Z992 Dependence on renal dialysis: Secondary | ICD-10-CM | POA: Diagnosis not present

## 2016-07-07 DIAGNOSIS — F17210 Nicotine dependence, cigarettes, uncomplicated: Secondary | ICD-10-CM | POA: Diagnosis not present

## 2016-07-07 DIAGNOSIS — N185 Chronic kidney disease, stage 5: Secondary | ICD-10-CM | POA: Diagnosis not present

## 2016-07-07 DIAGNOSIS — Z86711 Personal history of pulmonary embolism: Secondary | ICD-10-CM | POA: Diagnosis not present

## 2016-07-07 DIAGNOSIS — I120 Hypertensive chronic kidney disease with stage 5 chronic kidney disease or end stage renal disease: Secondary | ICD-10-CM | POA: Insufficient documentation

## 2016-07-07 DIAGNOSIS — Z4931 Encounter for adequacy testing for hemodialysis: Secondary | ICD-10-CM

## 2016-07-07 HISTORY — PX: REVISON OF ARTERIOVENOUS FISTULA: SHX6074

## 2016-07-07 HISTORY — DX: End stage renal disease: N18.6

## 2016-07-07 LAB — POCT I-STAT 4, (NA,K, GLUC, HGB,HCT)
Glucose, Bld: 78 mg/dL (ref 65–99)
HEMATOCRIT: 37 % — AB (ref 39.0–52.0)
HEMOGLOBIN: 12.6 g/dL — AB (ref 13.0–17.0)
POTASSIUM: 4.3 mmol/L (ref 3.5–5.1)
SODIUM: 140 mmol/L (ref 135–145)

## 2016-07-07 SURGERY — REVISON OF ARTERIOVENOUS FISTULA
Anesthesia: Monitor Anesthesia Care | Site: Arm Upper | Laterality: Left

## 2016-07-07 MED ORDER — PROPOFOL 500 MG/50ML IV EMUL
INTRAVENOUS | Status: DC | PRN
  Administered 2016-07-07: 65 ug/kg/min via INTRAVENOUS

## 2016-07-07 MED ORDER — DEXTROSE 5 % IV SOLN
1.5000 g | INTRAVENOUS | Status: AC
  Administered 2016-07-07: 1.5 g via INTRAVENOUS
  Filled 2016-07-07: qty 1.5

## 2016-07-07 MED ORDER — 0.9 % SODIUM CHLORIDE (POUR BTL) OPTIME
TOPICAL | Status: DC | PRN
  Administered 2016-07-07: 1000 mL

## 2016-07-07 MED ORDER — LIDOCAINE HCL (PF) 1 % IJ SOLN
INTRAMUSCULAR | Status: DC | PRN
  Administered 2016-07-07: 15 mL

## 2016-07-07 MED ORDER — PHENYLEPHRINE 40 MCG/ML (10ML) SYRINGE FOR IV PUSH (FOR BLOOD PRESSURE SUPPORT)
PREFILLED_SYRINGE | INTRAVENOUS | Status: AC
Start: 2016-07-07 — End: ?
  Filled 2016-07-07: qty 10

## 2016-07-07 MED ORDER — ONDANSETRON HCL 4 MG/2ML IJ SOLN
INTRAMUSCULAR | Status: DC | PRN
  Administered 2016-07-07: 4 mg via INTRAVENOUS

## 2016-07-07 MED ORDER — CHLORHEXIDINE GLUCONATE CLOTH 2 % EX PADS: 6.0000 | MEDICATED_PAD | Freq: Once | CUTANEOUS | Status: DC

## 2016-07-07 MED ORDER — PROPOFOL 10 MG/ML IV BOLUS
INTRAVENOUS | Status: DC | PRN
  Administered 2016-07-07: 20 mg via INTRAVENOUS
  Administered 2016-07-07 (×2): 30 mg via INTRAVENOUS

## 2016-07-07 MED ORDER — PROPOFOL 10 MG/ML IV BOLUS
INTRAVENOUS | Status: AC
  Filled 2016-07-07: qty 20

## 2016-07-07 MED ORDER — SODIUM CHLORIDE 0.9 % IV SOLN
INTRAVENOUS | Status: DC
  Administered 2016-07-07 (×2): via INTRAVENOUS

## 2016-07-07 MED ORDER — STERILE WATER FOR IRRIGATION IR SOLN
Status: DC | PRN
  Administered 2016-07-07: 1000 mL

## 2016-07-07 MED ORDER — PROPOFOL 1000 MG/100ML IV EMUL
INTRAVENOUS | Status: AC
  Filled 2016-07-07: qty 200

## 2016-07-07 MED ORDER — FENTANYL CITRATE (PF) 100 MCG/2ML IJ SOLN: 25.0000 ug | INTRAMUSCULAR | Status: DC | PRN

## 2016-07-07 MED ORDER — OXYCODONE-ACETAMINOPHEN 5-325 MG PO TABS
1.0000 | ORAL_TABLET | Freq: Four times a day (QID) | ORAL | 0 refills | Status: DC | PRN
Start: 2016-07-07 — End: 2016-08-15

## 2016-07-07 MED ORDER — HEPARIN SOD (PORK) LOCK FLUSH 10 UNIT/ML IV SOLN: 20.0000 [IU] | INTRAVENOUS | Status: DC | PRN

## 2016-07-07 MED ORDER — PROMETHAZINE HCL 25 MG/ML IJ SOLN: 6.2500 mg | INTRAMUSCULAR | Status: DC | PRN

## 2016-07-07 MED ORDER — SODIUM CHLORIDE 0.9 % IV SOLN
INTRAVENOUS | Status: DC | PRN
  Administered 2016-07-07: 500 mL

## 2016-07-07 MED ORDER — ONDANSETRON HCL 4 MG/2ML IJ SOLN
INTRAMUSCULAR | Status: AC
  Filled 2016-07-07: qty 2

## 2016-07-07 MED ORDER — FENTANYL CITRATE (PF) 100 MCG/2ML IJ SOLN
INTRAMUSCULAR | Status: AC
  Filled 2016-07-07: qty 2

## 2016-07-07 MED ORDER — LIDOCAINE HCL (PF) 1 % IJ SOLN
INTRAMUSCULAR | Status: AC
  Filled 2016-07-07: qty 30

## 2016-07-07 MED ORDER — FENTANYL CITRATE (PF) 100 MCG/2ML IJ SOLN
INTRAMUSCULAR | Status: DC | PRN
  Administered 2016-07-07 (×2): 25 ug via INTRAVENOUS

## 2016-07-07 SURGICAL SUPPLY — 32 items
ADH SKN CLS APL DERMABOND .7 (GAUZE/BANDAGES/DRESSINGS) ×1
ARMBAND PINK RESTRICT EXTREMIT (MISCELLANEOUS) ×2 IMPLANT
CANISTER SUCT 3000ML PPV (MISCELLANEOUS) ×2 IMPLANT
CLIP TI MEDIUM 6 (CLIP) ×2 IMPLANT
CLIP TI WIDE RED SMALL 6 (CLIP) ×2 IMPLANT
COVER PROBE W GEL 5X96 (DRAPES) ×2 IMPLANT
DERMABOND ADVANCED (GAUZE/BANDAGES/DRESSINGS) ×1
DERMABOND ADVANCED .7 DNX12 (GAUZE/BANDAGES/DRESSINGS) ×1 IMPLANT
ELECT REM PT RETURN 9FT ADLT (ELECTROSURGICAL) ×2
ELECTRODE REM PT RTRN 9FT ADLT (ELECTROSURGICAL) ×1 IMPLANT
GLOVE BIO SURGEON STRL SZ 6.5 (GLOVE) ×1 IMPLANT
GLOVE BIO SURGEON STRL SZ7.5 (GLOVE) ×2 IMPLANT
GLOVE BIOGEL PI IND STRL 6.5 (GLOVE) IMPLANT
GLOVE BIOGEL PI IND STRL 7.0 (GLOVE) IMPLANT
GLOVE BIOGEL PI INDICATOR 6.5 (GLOVE) ×3
GLOVE BIOGEL PI INDICATOR 7.0 (GLOVE) ×1
GLOVE ECLIPSE 6.5 STRL STRAW (GLOVE) ×2 IMPLANT
GOWN STRL REUS W/ TWL LRG LVL3 (GOWN DISPOSABLE) ×2 IMPLANT
GOWN STRL REUS W/ TWL XL LVL3 (GOWN DISPOSABLE) ×1 IMPLANT
GOWN STRL REUS W/TWL LRG LVL3 (GOWN DISPOSABLE) ×4
GOWN STRL REUS W/TWL XL LVL3 (GOWN DISPOSABLE) ×2
KIT BASIN OR (CUSTOM PROCEDURE TRAY) ×2 IMPLANT
KIT ROOM TURNOVER OR (KITS) ×2 IMPLANT
NS IRRIG 1000ML POUR BTL (IV SOLUTION) ×2 IMPLANT
PACK CV ACCESS (CUSTOM PROCEDURE TRAY) ×2 IMPLANT
PAD ARMBOARD 7.5X6 YLW CONV (MISCELLANEOUS) ×4 IMPLANT
SUT MNCRL AB 4-0 PS2 18 (SUTURE) ×2 IMPLANT
SUT PROLENE 6 0 BV (SUTURE) ×3 IMPLANT
SUT VIC AB 3-0 SH 27 (SUTURE) ×2
SUT VIC AB 3-0 SH 27X BRD (SUTURE) ×1 IMPLANT
UNDERPAD 30X30 (UNDERPADS AND DIAPERS) ×2 IMPLANT
WATER STERILE IRR 1000ML POUR (IV SOLUTION) ×2 IMPLANT

## 2016-07-07 NOTE — Anesthesia Postprocedure Evaluation (Signed)
Anesthesia Post Note  Patient: Steven Sellers  Procedure(s) Performed: Procedure(s) (LRB): CREATION OF LEFT ARM BRACHIOCEPHALIC FISTULA (Left)  Patient location during evaluation: PACU Anesthesia Type: MAC Level of consciousness: awake and alert Pain management: pain level controlled Vital Signs Assessment: post-procedure vital signs reviewed and stable Respiratory status: spontaneous breathing, nonlabored ventilation, respiratory function stable and patient connected to nasal cannula oxygen Cardiovascular status: stable and blood pressure returned to baseline Anesthetic complications: no       Last Vitals:  Vitals:   07/07/16 0915 07/07/16 0930  BP: (!) 167/95 (!) 167/90  Pulse: 66 73  Resp: 16 (!) 21  Temp:  36.4 C    Last Pain:  Vitals:   07/07/16 0935  TempSrc:   PainSc: 0-No pain                 Tiajuana Amass

## 2016-07-07 NOTE — Transfer of Care (Signed)
Immediate Anesthesia Transfer of Care Note  Patient: Steven Sellers  Procedure(s) Performed: Procedure(s): CREATION OF LEFT ARM BRACHIOCEPHALIC FISTULA (Left)  Patient Location: PACU  Anesthesia Type:MAC  Level of Consciousness: awake, alert , oriented and patient cooperative  Airway & Oxygen Therapy: Patient Spontanous Breathing and Patient connected to nasal cannula oxygen  Post-op Assessment: Report given to RN and Post -op Vital signs reviewed and stable  Post vital signs: Reviewed and stable  Last Vitals:  Vitals:   07/07/16 0624  BP: (!) 208/100  Pulse: (!) 57  Resp: 16  Temp: 36.5 C    Last Pain:  Vitals:   07/07/16 0624  TempSrc: Oral      Patients Stated Pain Goal: 8 (35/45/62 5638)  Complications: No apparent anesthesia complications

## 2016-07-07 NOTE — Op Note (Signed)
    Patient name: Steven Sellers MRN: [id number] DOB: 1961/01/09 Sex: male  07/07/2016 Pre-operative Diagnosis: esrd Post-operative diagnosis:  Same Surgeon:  Erlene Quan C. Donzetta Matters, MD Assistant: Silva Bandy, PA Procedure Performed: left arm brachiocephalic av fistula  Indications:  56 year old male has a history of end-stage renal disease on dialysis right internal jugular catheter has a left radiocephalic AV fistula that has primary nonfunction. He is now indicated for conversion to an upper arm fistula.  Findings: There is a large cephalic vein measuring 4 mm in diameter and easily dilated to 3.5. The artery was very large 6 mm external diameter. At completion there was very strong thrill both at the antecubitum and at the wrist. There is also a palpable radial artery pulse at the wrist.   Procedure:  The patient was identified in the holding area and taken to the operating room where he was placed supine on the operative table Mac anesthesia induced sterilely prepped and draped in his left arm for access procedure. Timeout was called he was given antibiotics. Ultrasound was used to evaluate the left cephalic vein which is noted to be large and possible. 10 mL of 1% lidocaine with epinephrine was then injected below the antecubitum transverse incision was made we dissected out the vein proximally and distally. The artery was a significant distance from the vein so we did have to dissected distally on the arm branches were ligated between clips. We then turned our attention towards the artery. The brachial radialis fascia was divided the artery was dissected free and vessel loop placed around it. This time the vein was transected distally and tied off dilated up to 3.5 mm and flushed with heparinized saline and marked for orientation. The artery was then clamped distally and proximally opened longitudinally flushed with heparinized saline. The vein was then spatulated and sewn end-to-side with 6-0 Prolene  suture. Prior to releasing our clamps we allowed flushing maneuvers. We then completed our anastomosis released our clamps had a palpable thrill in the runoff vein as well as a palpable radial pulse at the wrist. Satisfied with this we obtained hemostasis in the wound closed in layers with Vicryl for Monocryl and Dermabond placed above this. Patient to be transferred to PACU in stable condition without immediate complication.   Armanii Pressnell C. Donzetta Matters, MD [facility] [place] Office: [phone] Pager: [phone]

## 2016-07-07 NOTE — H&P (Signed)
     History and Physical Update  The patient was interviewed and re-examined.  The patient's previous History and Physical has been reviewed and is unchanged from office visit. Plan for left arm cephalic vein turndown and ligation of radiocephalic avf.   Chabely Norby C. Donzetta Matters, MD [facility] [place] Office: [phone] Pager: [phone]   07/07/2016, 7:15 AM

## 2016-07-08 ENCOUNTER — Encounter (HOSPITAL_COMMUNITY): Payer: Self-pay | Admitting: Vascular Surgery

## 2016-07-08 ENCOUNTER — Telehealth: Payer: Self-pay | Admitting: Vascular Surgery

## 2016-07-08 NOTE — Telephone Encounter (Signed)
Sched lab 08/11/16 at 4:00 and MD 08/15/16 at 11:30. Lm on hm# to inform pt of appt.

## 2016-07-08 NOTE — Telephone Encounter (Signed)
-----   Message from Mena Goes, RN sent at 07/07/2016 11:13 AM EST ----- Regarding: schedule 4-6 weeks w/ duplex   ----- Message ----- From: Alvia Grove, PA-C Sent: 07/07/2016   8:48 AM To: [facility] Charge Pool  S/p left brachial cephalic AVF 7/94/80  F/u with PA on [place] clinic day in 4-6 weeks with duplex.  Thanks Maudie Mercury

## 2016-08-05 ENCOUNTER — Encounter: Payer: Self-pay | Admitting: Vascular Surgery

## 2016-08-11 ENCOUNTER — Ambulatory Visit (HOSPITAL_COMMUNITY)
Admission: RE | Admit: 2016-08-11 | Discharge: 2016-08-11 | Disposition: A | Discharge status: Home / Self Care | Payer: BLUE CROSS/BLUE SHIELD | Source: Ambulatory Visit | Attending: Surgery | Admitting: Surgery

## 2016-08-11 DIAGNOSIS — Z4931 Encounter for adequacy testing for hemodialysis: Secondary | ICD-10-CM | POA: Insufficient documentation

## 2016-08-11 DIAGNOSIS — N186 End stage renal disease: Secondary | ICD-10-CM | POA: Insufficient documentation

## 2016-08-15 ENCOUNTER — Encounter: Payer: Self-pay | Admitting: Vascular Surgery

## 2016-08-15 ENCOUNTER — Ambulatory Visit (INDEPENDENT_AMBULATORY_CARE_PROVIDER_SITE_OTHER): Payer: Self-pay | Admitting: Vascular Surgery

## 2016-08-15 VITALS — BP 144/84 | HR 52 | Temp 97.2°F | Resp 16 | Ht 74.0 in | Wt 159.0 lb

## 2016-08-15 DIAGNOSIS — Z992 Dependence on renal dialysis: Secondary | ICD-10-CM

## 2016-08-15 DIAGNOSIS — N186 End stage renal disease: Secondary | ICD-10-CM

## 2016-08-15 NOTE — Progress Notes (Signed)
Subjective:     Patient ID: LOUIS IVERY, male   DOB: 1961/03/15, 56 y.o.   MRN: [id number]  HPI 56-year-old male follows up from recent placement of left brachiocephalic AV fistula. He previously had a radiocephalic and this was converted at the upper arm. He states that his hand does have some pale discoloration but is not hurting him and is not numb. He is currently on dialysis via catheter. He has no complaints related to today's visit.   Review of Systems No complaints    Objective:   Physical Exam [id number] Palpable left radial pulse with thrill in wrist fistula Grip strength 5/5 on left Left upper arm avf with palpable thrill       Assessment/plan     Additional male who is approximately 6 weeks out from conversion of left wrist to left upper arm cephalic vein AV fistula. At dialysis access duplex he does have elevated velocities at his anastomosis but there is a easily palpable thrill is vein is very superficial with adequate size and good flow volumes. Having said this should be able to be cannulated in 1 month. After has been used a few times we can get his catheter out. If there are issues with cannulation he may need fistulogram with retrograde balloon dilation of his anastomosis. He is understanding of all this and can follow-up on a when necessary basis.       Brandon C. Donzetta Matters, MD [facility] [place] Office: [phone] Pager: [phone]

## 2016-09-09 DIAGNOSIS — R569 Unspecified convulsions: Secondary | ICD-10-CM

## 2016-09-09 HISTORY — DX: Unspecified convulsions: R56.9

## 2016-09-25 ENCOUNTER — Emergency Department (HOSPITAL_COMMUNITY): Payer: BLUE CROSS/BLUE SHIELD

## 2016-09-25 ENCOUNTER — Encounter (HOSPITAL_COMMUNITY): Payer: Self-pay

## 2016-09-25 ENCOUNTER — Observation Stay (HOSPITAL_COMMUNITY)
Admission: EM | Admit: 2016-09-25 | Discharge: 2016-09-27 | Disposition: A | Discharge status: Home / Self Care | Payer: BLUE CROSS/BLUE SHIELD | Attending: Family Medicine | Admitting: Family Medicine

## 2016-09-25 DIAGNOSIS — R202 Paresthesia of skin: Secondary | ICD-10-CM | POA: Insufficient documentation

## 2016-09-25 DIAGNOSIS — G71039 Limb girdle muscular dystrophy, unspecified: Secondary | ICD-10-CM

## 2016-09-25 DIAGNOSIS — I509 Heart failure, unspecified: Secondary | ICD-10-CM | POA: Diagnosis not present

## 2016-09-25 DIAGNOSIS — Z91041 Radiographic dye allergy status: Secondary | ICD-10-CM | POA: Insufficient documentation

## 2016-09-25 DIAGNOSIS — R2689 Other abnormalities of gait and mobility: Secondary | ICD-10-CM | POA: Insufficient documentation

## 2016-09-25 DIAGNOSIS — Z86711 Personal history of pulmonary embolism: Secondary | ICD-10-CM | POA: Insufficient documentation

## 2016-09-25 DIAGNOSIS — E871 Hypo-osmolality and hyponatremia: Secondary | ICD-10-CM | POA: Diagnosis not present

## 2016-09-25 DIAGNOSIS — Z992 Dependence on renal dialysis: Secondary | ICD-10-CM | POA: Insufficient documentation

## 2016-09-25 DIAGNOSIS — M6281 Muscle weakness (generalized): Principal | ICD-10-CM | POA: Insufficient documentation

## 2016-09-25 DIAGNOSIS — E875 Hyperkalemia: Secondary | ICD-10-CM | POA: Diagnosis not present

## 2016-09-25 DIAGNOSIS — D649 Anemia, unspecified: Secondary | ICD-10-CM | POA: Insufficient documentation

## 2016-09-25 DIAGNOSIS — R29898 Other symptoms and signs involving the musculoskeletal system: Secondary | ICD-10-CM | POA: Diagnosis not present

## 2016-09-25 DIAGNOSIS — G710 Muscular dystrophy: Secondary | ICD-10-CM | POA: Insufficient documentation

## 2016-09-25 DIAGNOSIS — N186 End stage renal disease: Secondary | ICD-10-CM

## 2016-09-25 DIAGNOSIS — G7109 Other specified muscular dystrophies: Secondary | ICD-10-CM | POA: Diagnosis present

## 2016-09-25 DIAGNOSIS — I132 Hypertensive heart and chronic kidney disease with heart failure and with stage 5 chronic kidney disease, or end stage renal disease: Secondary | ICD-10-CM | POA: Diagnosis not present

## 2016-09-25 DIAGNOSIS — R531 Weakness: Secondary | ICD-10-CM

## 2016-09-25 DIAGNOSIS — R569 Unspecified convulsions: Secondary | ICD-10-CM | POA: Diagnosis not present

## 2016-09-25 DIAGNOSIS — F17210 Nicotine dependence, cigarettes, uncomplicated: Secondary | ICD-10-CM | POA: Diagnosis not present

## 2016-09-25 DIAGNOSIS — I10 Essential (primary) hypertension: Secondary | ICD-10-CM | POA: Diagnosis not present

## 2016-09-25 HISTORY — DX: Anemia, unspecified: D64.9

## 2016-09-25 HISTORY — DX: Tobacco use: Z72.0

## 2016-09-25 LAB — ETHANOL: Alcohol, Ethyl (B): <5 mg/dL (ref <5)

## 2016-09-25 LAB — COMPREHENSIVE METABOLIC PANEL
ALBUMIN: 4.2 g/dL (ref 3.5–5.0)
ALT: 12 U/L — AB (ref 17–63)
ANION GAP: 12 (ref 5–15)
AST: 16 U/L (ref 15–41)
Alkaline Phosphatase: 88 U/L (ref 38–126)
BILIRUBIN TOTAL: 0.7 mg/dL (ref 0.3–1.2)
BUN: 8 mg/dL (ref 6–20)
CO2: 30 mmol/L (ref 22–32)
CREATININE: 3.11 mg/dL — AB (ref 0.61–1.24)
Calcium: 8.9 mg/dL (ref 8.9–10.3)
Chloride: 99 mmol/L — ABNORMAL LOW (ref 101–111)
GFR calc Af Amer: 24 mL/min — ABNORMAL LOW (ref >60)
GFR calc non Af Amer: 21 mL/min — ABNORMAL LOW (ref >60)
GLUCOSE: 105 mg/dL — AB (ref 65–99)
Potassium: 3.6 mmol/L (ref 3.5–5.1)
Sodium: 141 mmol/L (ref 135–145)
TOTAL PROTEIN: 7.5 g/dL (ref 6.5–8.1)

## 2016-09-25 LAB — DIFFERENTIAL
Basophils Absolute: 0 10*3/uL (ref 0.0–0.1)
Basophils Relative: 0 %
EOS PCT: 0 %
Eosinophils Absolute: 0 10*3/uL (ref 0.0–0.7)
LYMPHS ABS: 1.9 10*3/uL (ref 0.7–4.0)
LYMPHS PCT: 33 %
Monocytes Absolute: 0.3 10*3/uL (ref 0.1–1.0)
Monocytes Relative: 5 %
NEUTROS ABS: 3.6 10*3/uL (ref 1.7–7.7)
NEUTROS PCT: 62 %

## 2016-09-25 LAB — CBC
HCT: 41 % (ref 39.0–52.0)
HEMOGLOBIN: 13.2 g/dL (ref 13.0–17.0)
MCH: 30.2 pg (ref 26.0–34.0)
MCHC: 32.2 g/dL (ref 30.0–36.0)
MCV: 93.8 fL (ref 78.0–100.0)
Platelets: 161 10*3/uL (ref 150–400)
RBC: 4.37 MIL/uL (ref 4.22–5.81)
RDW: 13.6 % (ref 11.5–15.5)
WBC: 5.9 10*3/uL (ref 4.0–10.5)

## 2016-09-25 LAB — PROTIME-INR
INR: 0.91
Prothrombin Time: 12.3 seconds (ref 11.4–15.2)

## 2016-09-25 LAB — TROPONIN I
TROPONIN I: 0.44 ng/mL — AB (ref <0.03)
TROPONIN I: 0.42 ng/mL — AB (ref <0.03)
Troponin I: 0.04 ng/mL (ref <0.03)

## 2016-09-25 LAB — APTT: aPTT: 49 seconds — ABNORMAL HIGH (ref 24–36)

## 2016-09-25 MED ORDER — ZOLPIDEM TARTRATE 5 MG PO TABS
5.0000 mg | ORAL_TABLET | Freq: Every day | ORAL | Status: DC
  Administered 2016-09-25 – 2016-09-26 (×2): 5 mg via ORAL
  Filled 2016-09-25 (×2): qty 1

## 2016-09-25 MED ORDER — HEPARIN SODIUM (PORCINE) 5000 UNIT/ML IJ SOLN
5000.0000 [IU] | Freq: Three times a day (TID) | INTRAMUSCULAR | Status: DC
  Administered 2016-09-26 – 2016-09-27 (×4): 5000 [IU] via SUBCUTANEOUS
  Filled 2016-09-25 (×4): qty 1

## 2016-09-25 MED ORDER — AMLODIPINE BESYLATE 5 MG PO TABS
10.0000 mg | ORAL_TABLET | Freq: Every day | ORAL | Status: DC
  Administered 2016-09-25 – 2016-09-27 (×3): 10 mg via ORAL
  Filled 2016-09-25 (×4): qty 2

## 2016-09-25 MED ORDER — METOPROLOL TARTRATE 25 MG PO TABS
25.0000 mg | ORAL_TABLET | Freq: Every day | ORAL | Status: DC
  Administered 2016-09-25 – 2016-09-26 (×2): 25 mg via ORAL
  Filled 2016-09-25 (×2): qty 1

## 2016-09-25 MED ORDER — ASPIRIN 325 MG PO TABS
325.0000 mg | ORAL_TABLET | Freq: Every day | ORAL | Status: DC
  Administered 2016-09-25 – 2016-09-27 (×3): 325 mg via ORAL
  Filled 2016-09-25 (×3): qty 1

## 2016-09-25 MED ORDER — ASPIRIN 300 MG RE SUPP: 300.0000 mg | Freq: Every day | RECTAL | Status: DC

## 2016-09-25 MED ORDER — SODIUM CHLORIDE 0.9 % IV SOLN
INTRAVENOUS | Status: AC
  Administered 2016-09-25: 18:00:00 via INTRAVENOUS

## 2016-09-25 MED ORDER — SEVELAMER CARBONATE 800 MG PO TABS
2400.0000 mg | ORAL_TABLET | Freq: Two times a day (BID) | ORAL | Status: DC
  Filled 2016-09-25: qty 3

## 2016-09-25 MED ORDER — STROKE: EARLY STAGES OF RECOVERY BOOK
Freq: Once | Status: DC
  Filled 2016-09-25: qty 1

## 2016-09-25 MED ORDER — ACETAMINOPHEN 650 MG RE SUPP: 650.0000 mg | RECTAL | Status: DC | PRN

## 2016-09-25 MED ORDER — SENNOSIDES-DOCUSATE SODIUM 8.6-50 MG PO TABS: 1.0000 | ORAL_TABLET | Freq: Every evening | ORAL | Status: DC | PRN

## 2016-09-25 MED ORDER — ACETAMINOPHEN 325 MG PO TABS: 650.0000 mg | ORAL_TABLET | ORAL | Status: DC | PRN

## 2016-09-25 MED ORDER — ACETAMINOPHEN 160 MG/5ML PO SOLN: 650.0000 mg | ORAL | Status: DC | PRN

## 2016-09-25 MED ORDER — FUROSEMIDE 40 MG PO TABS
40.0000 mg | ORAL_TABLET | Freq: Every evening | ORAL | Status: DC
  Administered 2016-09-25 – 2016-09-26 (×2): 40 mg via ORAL
  Filled 2016-09-25 (×2): qty 1

## 2016-09-25 NOTE — Progress Notes (Addendum)
CRITICAL VALUE ALERT  Critical value received: tropi 0.44  Date of notification:  09/25/16  Time of notification:  0029  Critical value read back: yes  Nurse who received alert:  LFoote, LPN  MD notified (1st page):  kadolph,MD  Time of first page:  [phone]  MD notified (2nd page):  Time of second page:  Responding MD: Adair Patter  Time MD responded: [phone]

## 2016-09-25 NOTE — ED Triage Notes (Signed)
Pt received dialysis for 5 hours today and when they were flushing his port with heparin at 1105, he had numbness in left arm and both legs.  Reports numbness in legs has improved but still having left arm numbness and weakness.  Also reports pt unsteady on his feet and says that is not normal for pt.  CBG with EMS was 116.

## 2016-09-25 NOTE — ED Notes (Signed)
HD pt, rarely voids.

## 2016-09-25 NOTE — ED Provider Notes (Signed)
[facility] DEPT Provider Note   CSN: [id number] Arrival date & time: 09/25/16  1140   An emergency department physician performed an initial assessment on this suspected stroke patient at 1146.  History   Chief Complaint Chief Complaint  Patient presents with  . Code Stroke    HPI Steven Sellers is a 56 y.o. male.  HPI  Pt was seen at 1245. Per EMS and pt report, c/o sudden onset and slow resolution of one episode of LUE "weakness" and LUE and bilat LE's "numbness" that occurred at 1105 today PTA. Pt states he was finishing HD when they flushed his port, and then his symptoms began. Pt also states he was "unsteady on his feet" which is not normal for him. Pt states his bilat LE's numbness has improved, as well as his LUE weakness, but his LUE continues to feel "numb." Denies CP/SOB, no back pain, no facial droop, no slurred speech.    Past Medical History:  Diagnosis Date  . Acute renal failure (ARF) (HCC)    Several episodes of HD in 2012  . Alcohol use   . ESRD (end stage renal disease) ([place])    Hemodialysis TTHS- [facility] in [place]  . Hypertension   . Muscular dystrophy ([place])   . 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 Pulmonary embolism ([place]) 2011  . Tobacco use   . Vision abnormalities     Patient Active Problem List   Diagnosis Date Noted  . Left arm weakness 09/25/2016  . CHF (congestive heart failure) ([place]) 12/16/2015  . CKD (chronic kidney disease) requiring chronic dialysis ([place]) 12/16/2015  . SOB (shortness of breath) 12/16/2015  . Central venous catheter in place   . Hyponatremia 11/30/2015  . Anemia 11/30/2015  . ESRD needing dialysis ([place]) 11/30/2015  . Acute renal failure (ARF) ([place]) 11/30/2015  . Hyperkalemia 05/12/2015  . CKD (chronic kidney disease) stage 3, GFR 30-59 ml/min 05/12/2015  . 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 Muscle atrophy 04/25/2015  . Arm weakness 04/25/2015  . Leg weakness 04/25/2015  . Dysesthesia  04/25/2015  . Essential hypertension 05/21/2010  . PULMONARY EMBOLISM 05/21/2010  . PLEURAL EFFUSION 05/21/2010  . RENAL FAILURE, ACUTE 05/21/2010    Past Surgical History:  Procedure Laterality Date  . AV FISTULA PLACEMENT Left 12/26/2015   Procedure: CREATION OF LEFT RADIAL-CEPHALIC ARTERIOVENOUS FISTULA FOR HEMODIALYSIS;  Surgeon: Vickie Epley, MD;  Location: [facility];  Service: Vascular;  Laterality: Left;  . AV FISTULA PLACEMENT Left 04/28/2016   Procedure: REVISION OF LEFT RADIAL CEPHALIC ARTERIOVENOUS (AV) FISTULA  FOR DURABLE HEMODIALYSIS;  Surgeon: Vickie Epley, MD;  Location: [facility];  Service: Vascular;  Laterality: Left;  . CARDIAC CATHETERIZATION  2002   normal coronary arteries  . CENTRAL VENOUS CATHETER INSERTION Right 12/04/2015   Procedure: INSERTION OF TUNNELED HEMODIALYSIS CATHETER RIGHT INTERNAL JUGULAR;  Surgeon: Vickie Epley, MD;  Location: [facility];  Service: General;  Laterality: Right;  . REVISON OF ARTERIOVENOUS FISTULA Left 07/07/2016   Procedure: CREATION OF LEFT ARM BRACHIOCEPHALIC FISTULA;  Surgeon: Waynetta Sandy, MD;  Location: [facility];  Service: Vascular;  Laterality: Left;       Home Medications    Prior to Admission medications   Medication Sig Start Date End Date Taking? Authorizing Provider  acetaminophen (TYLENOL) 500 MG tablet Take 1,000 mg by mouth every 6 (six) hours as needed for mild pain or moderate pain.   Yes [provider]  albuterol (PROVENTIL  HFA;VENTOLIN HFA) 108 (90 Base) MCG/ACT inhaler Inhale 2 puffs into the lungs every 6 (six) hours as needed for wheezing or shortness of breath.   Yes [provider]  amLODipine (NORVASC) 5 MG tablet Take 10 mg by mouth daily.    Yes [provider]  furosemide (LASIX) 20 MG tablet Take 40 mg by mouth every evening.   Yes [provider]  metoprolol (LOPRESSOR) 50 MG tablet Take 25 mg by mouth at bedtime.    Yes [provider]  sevelamer  carbonate (RENVELA) 2.4 g PACK Take 2.4 g by mouth 2 (two) times daily with a meal. Sometimes 3 times a day with large meals 04/04/16  Yes [provider]  zolpidem (AMBIEN) 10 MG tablet Take 5 mg by mouth at bedtime.  05/23/16  Yes [provider]    Family History Family History  Problem Relation Age of Onset  . Hypertension Mother   . Heart attack Father     Social History Social History  Substance Use Topics  . Smoking status: Current Every Day Smoker    Packs/day: 0.50    Years: 40.00    Types: Cigarettes    Start date: 05/12/1974  . Smokeless tobacco: Never Used     Comment: 1/2 pk per day  . Alcohol use No     Comment: occ     Allergies   Other   Review of Systems Review of Systems ROS: Statement: All systems negative except as marked or noted in the HPI; Constitutional: Negative for fever and chills. ; ; Eyes: Negative for eye pain, redness and discharge. ; ; ENMT: Negative for ear pain, hoarseness, nasal congestion, sinus pressure and sore throat. ; ; Cardiovascular: Negative for chest pain, palpitations, diaphoresis, dyspnea and peripheral edema. ; ; Respiratory: Negative for cough, wheezing and stridor. ; ; Gastrointestinal: Negative for nausea, vomiting, diarrhea, abdominal pain, blood in stool, hematemesis, jaundice and rectal bleeding. . ; ; Genitourinary: Negative for dysuria, flank pain and hematuria. ; ; Musculoskeletal: Negative for back pain and neck pain. Negative for swelling and trauma.; ; Skin: Negative for pruritus, rash, abrasions, blisters, bruising and skin lesion.; ; Neuro: +paresthesias, extremity weakness. Negative for headache, lightheadedness and neck stiffness. Negative for weakness, altered level of consciousness, altered mental status, involuntary movement, seizure and syncope.       Physical Exam Updated Vital Signs BP (!) 172/90   Pulse 63   Temp 97.9 F (36.6 C)   Resp 12   Ht 6\' 2"  (1.88 m)   Wt 159 lb (72.1 kg)    SpO2 96%   BMI 20.41 kg/m   Physical Exam 1250: Physical examination:  Nursing notes reviewed; Vital signs and O2 SAT reviewed;  Constitutional: Well developed, Well nourished, Well hydrated, In no acute distress; Head:  Normocephalic, atraumatic; Eyes: EOMI, PERRL, No scleral icterus; ENMT: Mouth and pharynx normal, Mucous membranes moist; Neck: Supple, Full range of motion, No lymphadenopathy; Cardiovascular: Regular rate and rhythm, No gallop; Respiratory: Breath sounds clear & equal bilaterally, No wheezes.  Speaking full sentences with ease, Normal respiratory effort/excursion; Chest: Nontender, Movement normal; Abdomen: Soft, Nontender, Nondistended, Normal bowel sounds; Extremities: Pulses normal, No tenderness, Mild LUE edema, No calf edema or asymmetry.; Neuro: AA&Ox3, Major CN grossly intact. No facial droop. Speech clear. No gross focal motor or sensory deficits in extremities.; Skin: Color normal, Warm, Dry.   ED Treatments / Results  Labs (all labs ordered are listed, but only abnormal results are displayed)  EKG  EKG Interpretation  Date/Time:  Thursday Sep 25 2016 11:45:07 EDT Ventricular Rate:  75 PR Interval:    QRS Duration: 117 QT Interval:  430 QTC Calculation: 481 R Axis:   41 Text Interpretation:  Sinus rhythm Probable left atrial enlargement Nonspecific intraventricular conduction delay Baseline wander Artifact When compared with ECG of 12/15/2015 Artifact is now Present Otherwise no significant change Confirmed by [facility]  MD, Nunzio Cory [phone]) on 09/25/2016 12:40:25 PM       Radiology   Procedures Procedures (including critical care time)  Medications Ordered in ED Medications - No data to display   Initial Impression / Assessment and Plan / ED Course  I have reviewed the triage vital signs and the nursing notes.  Pertinent labs & imaging results that were available during my care of the patient were reviewed by me and considered in my medical decision  making (see chart for details).  MDM Reviewed: previous chart, nursing note and vitals Reviewed previous: labs and ECG Interpretation: labs, ECG and CT scan Total time providing critical care: 30-74 minutes. This excludes time spent performing separately reportable procedures and services. Consults: neurology and admitting MD   CRITICAL CARE Performed by: Alfonzo Feller Total critical care time: 35 minutes Critical care time was exclusive of separately billable procedures and treating other patients. Critical care was necessary to treat or prevent imminent or life-threatening deterioration. Critical care was time spent personally by me on the following activities: development of treatment plan with patient and/or surrogate as well as nursing, discussions with consultants, evaluation of patient's response to treatment, examination of patient, obtaining history from patient or surrogate, ordering and performing treatments and interventions, ordering and review of laboratory studies, ordering and review of radiographic studies, pulse oximetry and re-evaluation of patient's condition.   Results for orders placed or performed during the hospital encounter of 09/25/16  Ethanol  Result Value Ref Range   Alcohol, Ethyl (B) <5 <5 mg/dL  Protime-INR  Result Value Ref Range   Prothrombin Time 12.3 11.4 - 15.2 seconds   INR 0.91   APTT  Result Value Ref Range   aPTT 49 (H) 24 - 36 seconds  CBC  Result Value Ref Range   WBC 5.9 4.0 - 10.5 K/uL   RBC 4.37 4.22 - 5.81 MIL/uL   Hemoglobin 13.2 13.0 - 17.0 g/dL   HCT 41.0 39.0 - 52.0 %   MCV 93.8 78.0 - 100.0 fL   MCH 30.2 26.0 - 34.0 pg   MCHC 32.2 30.0 - 36.0 g/dL   RDW 13.6 11.5 - 15.5 %   Platelets 161 150 - 400 K/uL  Differential  Result Value Ref Range   Neutrophils Relative % 62 %   Neutro Abs 3.6 1.7 - 7.7 K/uL   Lymphocytes Relative 33 %   Lymphs Abs 1.9 0.7 - 4.0 K/uL   Monocytes Relative 5 %   Monocytes Absolute 0.3 0.1 -  1.0 K/uL   Eosinophils Relative 0 %   Eosinophils Absolute 0.0 0.0 - 0.7 K/uL   Basophils Relative 0 %   Basophils Absolute 0.0 0.0 - 0.1 K/uL  Comprehensive metabolic panel  Result Value Ref Range   Sodium 141 135 - 145 mmol/L   Potassium 3.6 3.5 - 5.1 mmol/L   Chloride 99 (L) 101 - 111 mmol/L   CO2 30 22 - 32 mmol/L   Glucose, Bld 105 (H) 65 - 99 mg/dL   BUN 8 6 - 20 mg/dL   Creatinine, Ser 3.11 (  H) 0.61 - 1.24 mg/dL   Calcium 8.9 8.9 - 10.3 mg/dL   Total Protein 7.5 6.5 - 8.1 g/dL   Albumin 4.2 3.5 - 5.0 g/dL   AST 16 15 - 41 U/L   ALT 12 (L) 17 - 63 U/L   Alkaline Phosphatase 88 38 - 126 U/L   Total Bilirubin 0.7 0.3 - 1.2 mg/dL   GFR calc non Af Amer 21 (L) >60 mL/min   GFR calc Af Amer 24 (L) >60 mL/min   Anion gap 12 5 - 15  Troponin I  Result Value Ref Range   Troponin I 0.04 (HH) <0.03 ng/mL   Ct Head Wo Contrast Result Date: 09/25/2016 CLINICAL DATA:  Code stroke. Right-sided numbness and weakness. Dizziness EXAM: CT HEAD WITHOUT CONTRAST TECHNIQUE: Contiguous axial images were obtained from the base of the skull through the vertex without intravenous contrast. COMPARISON:  CT head 01/19/2016 FINDINGS: Brain: No evidence of acute infarction, hemorrhage, hydrocephalus, extra-axial collection or mass lesion/mass effect. Vascular: Negative for hyperdense vessel Skull: Negative Sinuses/Orbits: Negative Other: None IMPRESSION: Negative CT head Aspects score 10 These results were called by telephone at the time of interpretation on 09/25/2016 at 11:59 am to Dr. Francine Graven , who verbally acknowledged these results. Electronically Signed   By: Franchot Gallo M.D.   On: 09/25/2016 11:59    1210:  Code Stroke called on pt's arrival. [facility] Neuro MD has evaluated pt: states atypical symptoms have fully resolved, doubts TIA, but in pt with risk factors admit for TIA workup.   1250:  Troponin chronically elevated. BUN/Cr c/w ESRD on HD. Dx and testing d/w pt and family.  Questions  answered.  Verb understanding, agreeable to admit.   T/C to [facility] Dr. Adair Patter, case discussed, including:  HPI, pertinent PM/SHx, VS/PE, dx testing, ED course and treatment:  Agreeable to admit.   Final Clinical Impressions(s) / ED Diagnoses   Final diagnoses:  Paresthesias  Left arm weakness    New Prescriptions New Prescriptions   No medications on file     Francine Graven, DO 09/30/16 1610

## 2016-09-25 NOTE — ED Notes (Signed)
CRITICAL VALUE ALERT  Critical value received:  Troponin 0.04  Date of notification:  07/26/16  Time of notification:  7182  Critical value read back:Yes.    Nurse who received alert:  Madelyn Flavors, BSN  MD notified (1st page):  Dr Thurnell Garbe  Time of first page:  [phone]

## 2016-09-25 NOTE — H&P (Signed)
History and Physical    BRAHM BARBEAU KDX:[id number] DOB: 03/22/1961 DOA: 09/25/2016  PCP: Sharilyn Sites, MD  Patient coming from:  Dialysis center  Chief Complaint: left arm weakness  HPI: Steven Sellers is a 56 y.o. 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  Patient reports he was receiving heparin in his catheter and reports the tech pushed the heparin very fast and he felt very loopy.  About 5 minutes later he reported he felt like his arms, legs and back fell asleep and felt all tingling. He sensation began in his left arm and then spread to his other arm and legs bilaterally as well his back.  No changes in vision at that time.  He reports he was unable to move his left arm or fingers at that time.  He was able to move his other extremities at time of event but extremities felt a tingling sensation.  EMS was called while patient was at dialysis.  ED Course: Patient was seen and evaluated by Dr. Burnard Leigh.  Teleneurology was consulted and stated this did not sound like a TIA.  Patient symptoms had resolved by time he reached the emergency department.   Review of Systems: As per HPI otherwise 10 point review of systems negative. No chest pain, chest pressure, no shortness of breath, no recent trauma to the extremities, no change in vision, facial droop, change in sensation on face.   Past Medical History:  Diagnosis Date  . Acute renal failure (ARF) (HCC)    Several episodes of HD in 2012  . Alcohol use   . ESRD (end stage renal disease) ([place])    Hemodialysis [facility]- [facility] in [place]  . Hypertension   . Muscular dystrophy ([place])   . 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 Pulmonary embolism ([place]) 2011  . Tobacco use   . Vision abnormalities     Past Surgical History:  Procedure Laterality Date  . AV FISTULA PLACEMENT Left 12/26/2015   Procedure: CREATION OF  LEFT RADIAL-CEPHALIC ARTERIOVENOUS FISTULA FOR HEMODIALYSIS;  Surgeon: Vickie Epley, MD;  Location: [facility];  Service: Vascular;  Laterality: Left;  . AV FISTULA PLACEMENT Left 04/28/2016   Procedure: REVISION OF LEFT RADIAL CEPHALIC ARTERIOVENOUS (AV) FISTULA  FOR DURABLE HEMODIALYSIS;  Surgeon: Vickie Epley, MD;  Location: [facility];  Service: Vascular;  Laterality: Left;  . CARDIAC CATHETERIZATION  2002   normal coronary arteries  . CENTRAL VENOUS CATHETER INSERTION Right 12/04/2015   Procedure: INSERTION OF TUNNELED HEMODIALYSIS CATHETER RIGHT INTERNAL JUGULAR;  Surgeon: Vickie Epley, MD;  Location: [facility];  Service: General;  Laterality: Right;  . REVISON OF ARTERIOVENOUS FISTULA Left 07/07/2016   Procedure: CREATION OF LEFT ARM BRACHIOCEPHALIC FISTULA;  Surgeon: Waynetta Sandy, MD;  Location: [place];  Service: Vascular;  Laterality: Left;     reports that he has been smoking Cigarettes.  He started smoking about 42 years ago. He has a 20.00 pack-year smoking history. He has never used smokeless tobacco. He reports that he does not drink alcohol or use drugs.  Allergies  Allergen Reactions  . Other Other (See Comments)    IV contrast- Renal issues    Family History  Problem Relation Age of Onset  . Hypertension Mother   . Heart attack Father      Prior to Admission medications   Medication  Sig Start Date End Date Taking? Authorizing Provider  acetaminophen (TYLENOL) 500 MG tablet Take 1,000 mg by mouth every 6 (six) hours as needed for mild pain or moderate pain.   Yes [provider]  albuterol (PROVENTIL HFA;VENTOLIN HFA) 108 (90 Base) MCG/ACT inhaler Inhale 2 puffs into the lungs every 6 (six) hours as needed for wheezing or shortness of breath.   Yes [provider]  amLODipine (NORVASC) 5 MG tablet Take 10 mg by mouth daily.    Yes [provider]  furosemide (LASIX) 20 MG tablet Take 40 mg by mouth every evening.   Yes [provider]  metoprolol (LOPRESSOR) 50 MG tablet Take 25 mg by mouth at bedtime.    Yes [provider]  sevelamer carbonate (RENVELA) 2.4 g PACK Take 2.4 g by mouth 2 (two) times daily with a meal. Sometimes 3 times a day with large meals 04/04/16  Yes [provider]  zolpidem (AMBIEN) 10 MG tablet Take 5 mg by mouth at bedtime.  05/23/16  Yes [provider]    Physical Exam: Vitals:   09/25/16 1400 09/25/16 1415 09/25/16 1430 09/25/16 1603  BP: (!) 178/89 (!) 156/90 (!) 168/98 (!) 159/78  Pulse: 62 64 68 (!) 56  Resp: 14 16 17 18   Temp:    98.5 F (36.9 C)  TempSrc:    Oral  SpO2: 95% 95% 96% 97%  Weight:    69.4 kg (153 lb)  Height:    6\' 2"  (1.88 m)      Constitutional: NAD, calm, comfortable Vitals:   09/25/16 1400 09/25/16 1415 09/25/16 1430 09/25/16 1603  BP: (!) 178/89 (!) 156/90 (!) 168/98 (!) 159/78  Pulse: 62 64 68 (!) 56  Resp: 14 16 17 18   Temp:    98.5 F (36.9 C)  TempSrc:    Oral  SpO2: 95% 95% 96% 97%  Weight:    69.4 kg (153 lb)  Height:    6\' 2"  (1.88 m)   Eyes: PERRL, lids and conjunctivae normal ENMT: Mucous membranes are moist. Posterior pharynx clear of any exudate or lesions.Normal dentition.  Neck: normal, supple, no masses, no thyromegaly Respiratory: clear to auscultation bilaterally, no wheezing, no crackles. Normal respiratory effort. No accessory muscle use.  Cardiovascular: Regular rate and rhythm, no murmurs / rubs / gallops. No extremity edema. 2+ pedal pulses. No carotid bruits.  Abdomen: no tenderness, no masses palpated. No hepatosplenomegaly. Bowel sounds positive.  Musculoskeletal: no clubbing / cyanosis. No joint deformity upper and lower extremities. Good ROM, no contractures. Normal muscle tone.  Skin: no rashes, lesions, ulcers. No induration Neurologic: CN 2-12 grossly intact. Sensation intact, DTR normal. Strength 5/5 in all 4.  Psychiatric: Normal judgment and insight. Alert and oriented x 3.  Normal mood.   Labs on Admission: I have personally reviewed following labs and imaging studies  CBC:  Recent Labs Lab 09/25/16 1148  WBC 5.9  NEUTROABS 3.6  HGB 13.2  HCT 41.0  MCV 93.8  PLT 258   Basic Metabolic Panel:  Recent Labs Lab 09/25/16 1148  NA 141  K 3.6  CL 99*  CO2 30  GLUCOSE 105*  BUN 8  CREATININE 3.11*  CALCIUM 8.9   GFR: Estimated Creatinine Clearance: 26 mL/min (A) (by C-G formula based on SCr of 3.11 mg/dL (H)). Liver Function Tests:  Recent Labs Lab 09/25/16 1148  AST 16  ALT 12*  ALKPHOS 88  BILITOT 0.7  PROT 7.5  ALBUMIN 4.2  No results for input(s): LIPASE, AMYLASE in the last 168 hours. No results for input(s): AMMONIA in the last 168 hours. Coagulation Profile:  Recent Labs Lab 09/25/16 1148  INR 0.91   Cardiac Enzymes:  Recent Labs Lab 09/25/16 1150  TROPONINI 0.04*   BNP (last 3 results) No results for input(s): PROBNP in the last 8760 hours. HbA1C: No results for input(s): HGBA1C in the last 72 hours. CBG: No results for input(s): GLUCAP in the last 168 hours. Lipid Profile: No results for input(s): CHOL, HDL, LDLCALC, TRIG, CHOLHDL, LDLDIRECT in the last 72 hours. Thyroid Function Tests: No results for input(s): TSH, T4TOTAL, FREET4, T3FREE, THYROIDAB in the last 72 hours. Anemia Panel: No results for input(s): VITAMINB12, FOLATE, FERRITIN, TIBC, IRON, RETICCTPCT in the last 72 hours. Urine analysis:    Component Value Date/Time   COLORURINE YELLOW 05/21/2016 [place] 05/21/2016 0941   LABSPEC 1.020 05/21/2016 0941   PHURINE 8.0 05/21/2016 0941   GLUCOSEU NEGATIVE 05/21/2016 0941   HGBUR LARGE (A) 05/21/2016 0941   BILIRUBINUR NEGATIVE 05/21/2016 0941   KETONESUR NEGATIVE 05/21/2016 0941   PROTEINUR >300 (A) 05/21/2016 0941   UROBILINOGEN 0.2 04/11/2010 2336   NITRITE NEGATIVE 05/21/2016 0941   LEUKOCYTESUR NEGATIVE 05/21/2016 0941   Sepsis Labs:  !!!!!!!!!!!!!!!!!!!!!!!!!!!!!!!!!!!!!!!!!!!! @LABRCNTIP (procalcitonin:4,lacticidven:4) )No results found for this or any previous visit (from the past 240 hour(s)).   Radiological Exams on Admission: Ct Head Wo Contrast  Result Date: 09/25/2016 CLINICAL DATA:  Code stroke. Right-sided numbness and weakness. Dizziness EXAM: CT HEAD WITHOUT CONTRAST TECHNIQUE: Contiguous axial images were obtained from the base of the skull through the vertex without intravenous contrast. COMPARISON:  CT head 01/19/2016 FINDINGS: Brain: No evidence of acute infarction, hemorrhage, hydrocephalus, extra-axial collection or mass lesion/mass effect. Vascular: Negative for hyperdense vessel Skull: Negative Sinuses/Orbits: Negative Other: None IMPRESSION: Negative CT head Aspects score 10 These results were called by telephone at the time of interpretation on 09/25/2016 at 11:59 am to Dr. Francine Graven , who verbally acknowledged these results. Electronically Signed   By: Franchot Gallo M.D.   On: 09/25/2016 11:59    EKG: Independently reviewed. Baseline wander, NSR  Assessment/Plan Active Problems:   Essential hypertension   Limb-girdle muscular dystrophy (HCC)   Anemia   ESRD needing dialysis (HCC)   CKD (chronic kidney disease) requiring chronic dialysis (HCC)   Left arm weakness     Left arm weakness - TIA pathway - patient refusing MRI; discussed at length with patient and his wife that without an MRI could not definitively evaluate for ischemic stroke but patient declined MRI repeatedly stating he had to be "knocked out" previously for MRI.  Offered medication to help with claustrophobia but patient declined.  He did not want medication to assist with getting the MRI and then later stated he would not even allow general anesthesia to get MRI.  Wife did try to persuade patient but he voiced he would not even try to get MRI due to past experiences.  Secondary to outcome of this conversation, no MRI/ MRA was  ordered - will get echocardiogram, carotid ultrasound - FLP and HgA1c ordered - aspirin given - CT head negative for intracranial abnormality - sx's resolved - PT consult  ESRD - dialysis T, Th, S - will let nephrology know patient is here if he stays until Saturday  Anemia - H/H stable  Limb girdle muscular dystrophy - stable  HTN - can restart home medications    DVT prophylaxis: heparin  Code Status: Full Code  Family Communication: Wife is bedside Disposition Plan: Likely discharge back to previous home environment Consults called: Neurology Admission status:  Observation, telemetry   Loretha Stapler MD [facility] Pager [phone]  If 7PM-7AM, please contact night-coverage [email or web address] Password [facility]  09/25/2016, 4:45 PM

## 2016-09-26 ENCOUNTER — Observation Stay (HOSPITAL_COMMUNITY): Payer: BLUE CROSS/BLUE SHIELD

## 2016-09-26 ENCOUNTER — Observation Stay (HOSPITAL_BASED_OUTPATIENT_CLINIC_OR_DEPARTMENT_OTHER): Payer: BLUE CROSS/BLUE SHIELD

## 2016-09-26 ENCOUNTER — Encounter (HOSPITAL_COMMUNITY): Payer: Self-pay | Admitting: Cardiology

## 2016-09-26 ENCOUNTER — Other Ambulatory Visit (HOSPITAL_COMMUNITY): Payer: BLUE CROSS/BLUE SHIELD

## 2016-09-26 DIAGNOSIS — R569 Unspecified convulsions: Secondary | ICD-10-CM | POA: Diagnosis not present

## 2016-09-26 DIAGNOSIS — I340 Nonrheumatic mitral (valve) insufficiency: Secondary | ICD-10-CM | POA: Diagnosis not present

## 2016-09-26 DIAGNOSIS — I248 Other forms of acute ischemic heart disease: Secondary | ICD-10-CM | POA: Diagnosis not present

## 2016-09-26 DIAGNOSIS — D649 Anemia, unspecified: Secondary | ICD-10-CM | POA: Diagnosis not present

## 2016-09-26 DIAGNOSIS — N186 End stage renal disease: Secondary | ICD-10-CM | POA: Diagnosis not present

## 2016-09-26 DIAGNOSIS — Z992 Dependence on renal dialysis: Secondary | ICD-10-CM | POA: Diagnosis not present

## 2016-09-26 DIAGNOSIS — I10 Essential (primary) hypertension: Secondary | ICD-10-CM | POA: Diagnosis not present

## 2016-09-26 LAB — COMPREHENSIVE METABOLIC PANEL
ALBUMIN: 3.5 g/dL (ref 3.5–5.0)
ALT: 13 U/L — ABNORMAL LOW (ref 17–63)
ANION GAP: 13 (ref 5–15)
AST: 21 U/L (ref 15–41)
Alkaline Phosphatase: 75 U/L (ref 38–126)
BUN: 16 mg/dL (ref 6–20)
CHLORIDE: 103 mmol/L (ref 101–111)
CO2: 25 mmol/L (ref 22–32)
Calcium: 8.9 mg/dL (ref 8.9–10.3)
Creatinine, Ser: 5.22 mg/dL — ABNORMAL HIGH (ref 0.61–1.24)
GFR calc Af Amer: 13 mL/min — ABNORMAL LOW (ref >60)
GFR calc non Af Amer: 11 mL/min — ABNORMAL LOW (ref >60)
Glucose, Bld: 78 mg/dL (ref 65–99)
POTASSIUM: 4.3 mmol/L (ref 3.5–5.1)
SODIUM: 141 mmol/L (ref 135–145)
Total Bilirubin: 0.8 mg/dL (ref 0.3–1.2)
Total Protein: 6.4 g/dL — ABNORMAL LOW (ref 6.5–8.1)

## 2016-09-26 LAB — CBC WITH DIFFERENTIAL/PLATELET
BASOS ABS: 0.1 10*3/uL (ref 0.0–0.1)
BASOS PCT: 1 %
EOS ABS: 0.2 10*3/uL (ref 0.0–0.7)
EOS PCT: 2 %
HEMATOCRIT: 36.8 % — AB (ref 39.0–52.0)
Hemoglobin: 11.8 g/dL — ABNORMAL LOW (ref 13.0–17.0)
LYMPHS PCT: 17 %
Lymphs Abs: 1.4 10*3/uL (ref 0.7–4.0)
MCH: 30.2 pg (ref 26.0–34.0)
MCHC: 32.1 g/dL (ref 30.0–36.0)
MCV: 94.1 fL (ref 78.0–100.0)
MONO ABS: 0.5 10*3/uL (ref 0.1–1.0)
Monocytes Relative: 6 %
NEUTROS ABS: 6.1 10*3/uL (ref 1.7–7.7)
Neutrophils Relative %: 75 %
PLATELETS: 158 10*3/uL (ref 150–400)
RBC: 3.91 MIL/uL — ABNORMAL LOW (ref 4.22–5.81)
RDW: 13.7 % (ref 11.5–15.5)
WBC: 8.2 10*3/uL (ref 4.0–10.5)

## 2016-09-26 LAB — LIPID PANEL
Cholesterol: 128 mg/dL (ref 0–200)
HDL: 42 mg/dL (ref >40)
LDL Cholesterol: 66 mg/dL (ref 0–99)
Total CHOL/HDL Ratio: 3 RATIO
Triglycerides: 99 mg/dL (ref <150)
VLDL: 20 mg/dL (ref 0–40)

## 2016-09-26 LAB — ECHOCARDIOGRAM COMPLETE
Height: 74 in
Weight: 2448 oz

## 2016-09-26 LAB — MAGNESIUM: Magnesium: 2.2 mg/dL (ref 1.7–2.4)

## 2016-09-26 LAB — TROPONIN I: TROPONIN I: 0.29 ng/mL — AB (ref <0.03)

## 2016-09-26 LAB — HIV ANTIBODY (ROUTINE TESTING W REFLEX): HIV SCREEN 4TH GENERATION: NONREACTIVE

## 2016-09-26 LAB — GLUCOSE, CAPILLARY: GLUCOSE-CAPILLARY: 102 mg/dL — AB (ref 65–99)

## 2016-09-26 LAB — MRSA PCR SCREENING: MRSA by PCR: NEGATIVE

## 2016-09-26 MED ORDER — LEVETIRACETAM 500 MG/5ML IV SOLN
INTRAVENOUS | Status: AC
  Filled 2016-09-26: qty 10

## 2016-09-26 MED ORDER — MIDAZOLAM HCL 2 MG/2ML IJ SOLN
2.0000 mg | Freq: Once | INTRAMUSCULAR | Status: AC
  Administered 2016-09-26: 2 mg via INTRAVENOUS

## 2016-09-26 MED ORDER — LORAZEPAM 2 MG/ML IJ SOLN
2.0000 mg | INTRAMUSCULAR | Status: DC | PRN
  Administered 2016-09-26: 1 mg via INTRAVENOUS
  Administered 2016-09-26: 2 mg via INTRAVENOUS
  Filled 2016-09-26: qty 1

## 2016-09-26 MED ORDER — LORAZEPAM 2 MG/ML IJ SOLN
INTRAMUSCULAR | Status: AC
  Administered 2016-09-26: 2 mg via INTRAVENOUS
  Filled 2016-09-26: qty 1

## 2016-09-26 MED ORDER — SEVELAMER CARBONATE 2.4 G PO PACK
2.4000 g | PACK | Freq: Two times a day (BID) | ORAL | Status: DC
  Administered 2016-09-26 – 2016-09-27 (×3): 2.4 g via ORAL
  Filled 2016-09-26 (×7): qty 1

## 2016-09-26 MED ORDER — MIDAZOLAM HCL 2 MG/2ML IJ SOLN
INTRAMUSCULAR | Status: AC
  Administered 2016-09-26: 2 mg via INTRAVENOUS
  Filled 2016-09-26: qty 2

## 2016-09-26 MED ORDER — SODIUM CHLORIDE 0.9 % IV SOLN
1000.0000 mg | Freq: Once | INTRAVENOUS | Status: AC
  Administered 2016-09-26: 1000 mg via INTRAVENOUS
  Filled 2016-09-26: qty 10

## 2016-09-26 NOTE — Progress Notes (Signed)
PROGRESS NOTE    Steven Sellers  YQM:[id number] DOB: [birth year] DOA: 09/25/2016 PCP: Sharilyn Sites, MD    Brief Narrative:  Steven Sellers is a 56 y.o. 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  Patient reports he was receiving heparin in his catheter and reports the tech pushed the heparin very fast and he felt very loopy.  About 5 minutes later he reported he felt like his arms, legs and back fell asleep and felt all tingling. He sensation began in his left arm and then spread to his other arm and legs bilaterally as well his back.  No changes in vision at that time.  He reports he was unable to move his left arm or fingers at that time.  He was able to move his other extremities at time of event but extremities felt a tingling sensation.  EMS was called while patient was at dialysis.  Patient had returned to baseline by time he was brought to the emergency room.  He underwent CT scan of the head which was negative.  He refused MRI.  Early am on 5/18 he was found to be seizing.  He does not have a history of seizures.  He was given a dose of keppra and ativan.  Neurology was consulted however Dr. Merlene Laughter not available.  Dr. Shon Hale, neurologist at [facility], called and case was discussed.   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 The vertebral arteries remain patent with normal antegrade flow. - FLP showing LDL of 66, triglyceride of 99 -  HgA1c pending - aspirin given - CT head negative for intracranial abnormality - MRI pending - PT suggesting outpatient PT  Seizure - first seizure - patient finally agreed to get MRI with ativan - will need to follow up on MRI results - if MRI abnormal will need 3 months of Keppra of 500mg  BID and additional 250mg  after dialysis - EEG ordered but patient at MRI when tech arrived and could not stay therefore EEG could not be performed   ESRD - dialysis T, Th, S - nephrology consulted  Anemia - H/H stable  Limb girdle muscular dystrophy - patient seen previously by neurology at [facility] - will encourage patient to follow up at [facility] outpatient  HTN - can restart home medications    DVT prophylaxis: heparin  Code Status: Full Code  Family Communication: Wife is bedside Disposition Plan: Likely discharge back to previous home environment   Consultants:   Cardiology  Neurology (via telephone)  Nephrology  Procedures:   Echocardiogram  Antimicrobials:   None    Subjective: Patient had seizure overnight- per his wife this is a first time seizure.  He was given Keppra, Versed and Ativan.  Patient drowsy this am.  This afternoon patient agreed to MRI with ativan.  Patient does not remember any of the events surrounding seizures.  Objective: Vitals:   09/26/16 0430 09/26/16 0445 09/26/16 0500 09/26/16 5284  BP: 138/81 118/72 120/68 115/73  Pulse: 70 63 60 60  Resp: 17 14 14 14   Temp:      TempSrc:      SpO2: 99% 98% 98% 99%  Weight:      Height:        Intake/Output Summary (Last 24 hours) at 09/26/16 0804 Last data filed at 09/26/16 0000  Gross per 24 hour  Intake           249.17 ml  Output                0 ml  Net           249.17 ml   Filed Weights   09/25/16 1146 09/25/16 1603  Weight: 72.1 kg (159 lb) 69.4 kg (153 lb)    Examination:  General exam: Appears calm and comfortable  Respiratory system: Clear to auscultation.  Respiratory effort normal. Cardiovascular system: S1 & S2 heard, RRR. No JVD, murmurs, rubs, gallops or clicks. No pedal edema. Dialysis catheter in place in the right neck Gastrointestinal system: Abdomen is nondistended, soft and nontender. No organomegaly or masses felt. Normal bowel sounds heard. Central nervous system: CN II- XII intact, no weakness or deficits noted Extremities: Symmetric 5 x 5 power.  Left arm with palpable thrill,swelling and ecchymosis of left arm Skin: scattered ecchymoses Psychiatry: mood and affect appropriate, insight intact    Data Reviewed: I have personally reviewed following labs and imaging studies  CBC:  Recent Labs Lab 09/25/16 1148 09/26/16 0438  WBC 5.9 8.2  NEUTROABS 3.6 6.1  HGB 13.2 11.8*  HCT 41.0 36.8*  MCV 93.8 94.1  PLT 161 132   Basic Metabolic Panel:  Recent Labs Lab 09/25/16 1148 09/26/16 0438  NA 141 141  K 3.6 4.3  CL 99* 103  CO2 30 25  GLUCOSE 105* 78  BUN 8 16  CREATININE 3.11* 5.22*  CALCIUM 8.9 8.9  MG  --  2.2   GFR: Estimated Creatinine Clearance: 15.5 mL/min (A) (by C-G formula based on SCr of 5.22 mg/dL (H)). Liver Function Tests:  Recent Labs Lab 09/25/16 1148 09/26/16 0438  AST 16 21  ALT 12* 13*  ALKPHOS 88 75  BILITOT 0.7 0.8  PROT 7.5 6.4*  ALBUMIN 4.2 3.5   No results for input(s): LIPASE, AMYLASE in the last 168 hours. No results for input(s): AMMONIA in the last 168 hours. Coagulation Profile:  Recent Labs Lab 09/25/16 1148  INR 0.91   Cardiac Enzymes:  Recent Labs Lab 09/25/16 1150 09/25/16 1641 09/25/16 2240 09/26/16 0438  TROPONINI 0.04* 0.44* 0.42* 0.29*   BNP (last 3 results) No results for input(s): PROBNP in the last 8760 hours. HbA1C: No results for input(s): HGBA1C in the last 72 hours. CBG:  Recent Labs Lab 09/26/16 0313  GLUCAP 102*   Lipid Profile:  Recent Labs  09/26/16 0438  CHOL 128  HDL 42  LDLCALC 66  TRIG 99  CHOLHDL 3.0   Thyroid  Function Tests: No results for input(s): TSH, T4TOTAL, FREET4, T3FREE, THYROIDAB in the last 72 hours. Anemia Panel: No results for input(s): VITAMINB12, FOLATE, FERRITIN, TIBC, IRON, RETICCTPCT in the last 72 hours. Sepsis Labs: No results for input(s): PROCALCITON, LATICACIDVEN in the last 168 hours.  No results found for this or any previous visit (from the past 240 hour(s)).       Radiology Studies: Ct Head Wo Contrast  Result Date: 09/26/2016 CLINICAL DATA:  Weakness. Agitation kidney fusion. Patient refuses MRI.  EXAM: CT HEAD WITHOUT CONTRAST TECHNIQUE: Contiguous axial images were obtained from the base of the skull through the vertex without intravenous contrast. COMPARISON:  09/25/2016 CT FINDINGS: Brain: No evidence of acute infarction, hemorrhage, hydrocephalus, extra-axial collection or mass lesion/mass effect. Vascular: No hyperdense vessel or unexpected calcification. Skull: Normal. Negative for fracture or focal lesion. Sinuses/Orbits: No acute finding. Other: None. IMPRESSION: No significant change from recent comparison. No acute intracranial abnormality. Electronically Signed   By: Ashley Royalty M.D.   On: 09/26/2016 04:25   Ct Head Wo Contrast  Result Date: 09/25/2016 CLINICAL DATA:  Code stroke. Right-sided numbness and weakness. Dizziness EXAM: CT HEAD WITHOUT CONTRAST TECHNIQUE: Contiguous axial images were obtained from the base of the skull through the vertex without intravenous contrast. COMPARISON:  CT head 01/19/2016 FINDINGS: Brain: No evidence of acute infarction, hemorrhage, hydrocephalus, extra-axial collection or mass lesion/mass effect. Vascular: Negative for hyperdense vessel Skull: Negative Sinuses/Orbits: Negative Other: None IMPRESSION: Negative CT head Aspects score 10 These results were called by telephone at the time of interpretation on 09/25/2016 at 11:59 am to Dr. Francine Graven , who verbally acknowledged these results. Electronically Signed   By:  Franchot Gallo M.D.   On: 09/25/2016 11:59        Scheduled Meds: .  stroke: mapping our early stages of recovery book   Does not apply Once  . amLODipine  10 mg Oral Daily  . aspirin  300 mg Rectal Daily   Or  . aspirin  325 mg Oral Daily  . furosemide  40 mg Oral QPM  . heparin  5,000 Units Subcutaneous Q8H  . metoprolol tartrate  25 mg Oral QHS  . sevelamer carbonate  2.4 g Oral BID WC  . zolpidem  5 mg Oral QHS   Continuous Infusions:   LOS: 0 days    Time spent: 30 minutes    Loretha Stapler, MD [facility] Pager [phone]  If 7PM-7AM, please contact night-coverage [email or web address] Password [email or web address] 09/26/2016, 8:04 AM

## 2016-09-26 NOTE — Care Management Note (Signed)
Case Management Note  Patient Details  Name: Steven Sellers MRN: [id number] Date of Birth: 1960/07/17  Subjective/Objective: Adm with left arm weakness. From home, usually ind with ADL's. Has cane if needed. Dialysis is T, TH, S schedule. Recommended by PT for OP PT. Patient declines.   Aslo recommended for RW. Patient agreeable, no preference on provider.                Action/Plan:Linda [place] of [facility] notified and will obtain order from chart. RW to be delivered to room. Patient told to notify CM if he changes his mind regarding OP PT.    Expected Discharge Date:       09/27/2016           Expected Discharge Plan:  Home/Self Care  In-House Referral:     Discharge planning Services  CM Consult  Post Acute Care Choice:  Durable Medical Equipment Choice offered to:  Patient  DME Arranged:  [name] rolling DME Agency:  [place]:    [facility] Agency:     Status of Service:  In process, will continue to follow  If discussed at Long Length of Stay Meetings, dates discussed:    Additional Comments:  Cherry Turlington, [name], RN 09/26/2016, 1:11 PM

## 2016-09-26 NOTE — Progress Notes (Signed)
OT Cancellation Note  Patient Details Name: Steven Sellers MRN: [id number] DOB: [birth year]   Cancelled Treatment:     Reason evaluation not completed: Pt unavailable. OT attempted evaluation, pt receiving echocardiogram this am. Will attempt evaluation at a later time.   Guadelupe Sabin, OTR/L  [phone] 09/26/2016, 8:51 AM

## 2016-09-26 NOTE — Progress Notes (Signed)
At 0305 telemetry notified of pt's pulse increased to 160, pt was found to be having a seizure. No seizure history noted. Seizure lasted approximately 2 minutes before pt went into postictal state. MD made aware of situation. Vitals and CBG obtained and pt was placed on 2 liters O2. Pt became combative and soft restraints were placed for safety of pt and staff. Pt was given 2 mg IV ativan and 2 mg IV versed per verbal order from Dr. Myna Hidalgo. Immediately following episode pt was unable to answer any questions. Stat head CT ordered and performed and pt taken to stepdown unit. Within a few minutes of arrival to stepdown pt was verbal again and able to answer questions. Pt's wife made aware of situation.

## 2016-09-26 NOTE — Significant Event (Signed)
Patient admitted yesterday from dialysis with paresthesias and weakness, had negative head CT and refused MRI. He had reportedly been doing well per nursing until he was noted to have generalized seizure. There does not appear to be any history of seizures. Glucose was just over 100.  Following the episode, the patient has been confused and agitated, swinging his arms and legs and posing a danger to himself and staff. He was given 2 mg Ativan and placed in soft restraints for safety. There is no facial asymmetry and he is moving all of his extremities. He is not answering questions. Stat head CT will be performed, EEG ordered, and neurology asked to evaluate. Nursing has not followed his family of the status change. He will be transferred to stepdown unit for closer monitoring.

## 2016-09-26 NOTE — Progress Notes (Signed)
SLP Cancellation Note  Patient Details Name: Steven Sellers MRN: [id number] DOB: 08-26-1960   Cancelled treatment:       Reason Eval/Treat Not Completed: SLP screened, no needs identified, will sign off. Spoke with RN and pt. RN reports that pt seems to be at baseline with speech/ language/ cognitive skills and that wife had felt the same earlier today. Awoke pt from nap; he appeared tired but answered questions appropriately/ clearly and stated has not noticed changes in language or cognitive skills. Head CTs negative. SLP will sign off at this time. Please re-consult if needs arise.    Kern Reap, MA, CCC-SLP 09/26/2016, 10:33 AM

## 2016-09-26 NOTE — Progress Notes (Signed)
Family notified of patient condition. Wife present at hospital and questions answered.

## 2016-09-26 NOTE — Progress Notes (Signed)
Arrived at pt's room for EEG. Pt went to MRI. Pt is unavailable.

## 2016-09-26 NOTE — Plan of Care (Signed)
Problem: Education: Goal: Knowledge of disease or condition will improve Outcome: Progressing Educated patient on importance of managing blood pressure, and taking an anticoagulant. Also educated patient on the importance of obtaining a MRI, EEG, echo, and carotid doppler study. Patient and family verbalized understanding.

## 2016-09-26 NOTE — Progress Notes (Signed)
SLP Cancellation Note  Patient Details Name: RAFORD BRISSETT MRN: [id number] DOB: [birth year]   Cancelled treatment:       Reason Eval/Treat Not Completed: Patient at procedure or test/unavailable. Will reattempt as time allows.   Kern Reap, MA, CCC-SLP 09/26/2016, 8:46 AM

## 2016-09-26 NOTE — Consult Note (Signed)
Cardiology Consultation:   Patient ID: Steven Sellers; [id number]; 1960-09-13   Admit date: 09/25/2016 Date of Consult: 09/26/2016  Primary Care Provider: Sharilyn Sites, MD Primary Cardiologist: Dr. Harrington Challenger   Patient Profile:   Steven Sellers is a 56 y.o. male with history of remote PE (2011), HTN, anemia, ESRD on HD TTS (started HD 2017, prior AKI after contrast in 2011), limb gerdle muscular dystrophy, habitual alcohol use, tobacco use whom we are asked to see for elevated troponin by Dr. Adair Patter.    History of Present Illness:   Per review of chart the patient was evaluated in 05/2015 by Dr. Harrington Challenger for episodes of near syncope not clearly cardiac in etiology. 2D Echo 05/2015 showed EF 55%, grade 1 DD, mild LAE. Event monitor was unremarkable although he did not have episodes while wearing this.   He was admitted yesterday with parasthesias. He was at dialysis. After seeing the heparin being pushed very fast, he felt loopy. About 5 minutes later he felt his arms, leg, and back "falling asleep" - sensation began in left arm and spread to the rest of his body. He felt he was unable to move his left arm or fingers at that time. EMS was called. CT head was negative for acute stroke yesterday and today. Patient declined MRI due to prior issues with claustrophobia, even after being offered sedation. Overnight telemetry notified that patient's pulse was reportedly 160, nurse found patient to be actively seizing for 2 minutes before he went into post-ictal state. After seizure he became combatize requiring Ativan and Versed. Within several minutes he regained full consciousness and awareness. We do not have telemetry available for review as he was transferred from floor to stepdown thereafter, 2D echo and carotid US pending. Hgb 13.2->11.8, K 3.6, Cr 3.11-> 5.22, LDL 66, HIV nonreactive, EtOH <5, troponins drawn 0.04->0.44->0.42->0.29.  VSS except patient is moderately hypertensive at times up to peak of  178/89 (one outlier of 233/91 at time of seizure).  At baseline he has "good days and not so good days " - bad days are characterized by feeling drained from dialysis. He does not ambulate with any assistive devices. His MD limits his ability to raise his arms over his head. He denies any recent syncope. No recent or prior chest pain/pressure, SOB, LEE, orthopnea, palpitations. He still feels like his left arm feels somewhat strange. Telemetry since transfer shows NSR. He denies any family history of heart disease.   Past Medical History:  Diagnosis Date  . Alcohol use   . Anemia   . ESRD (end stage renal disease) ([place])    Hemodialysis TTHS- [facility] in [place]  . Hypertension   . 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 Pulmonary embolism ([place]) 2011  . Tobacco abuse   . Vision abnormalities     Past Surgical History:  Procedure Laterality Date  . AV FISTULA PLACEMENT Left 12/26/2015   Procedure: CREATION OF LEFT RADIAL-CEPHALIC ARTERIOVENOUS FISTULA FOR HEMODIALYSIS;  Surgeon: Vickie Epley, MD;  Location: [facility];  Service: Vascular;  Laterality: Left;  . AV FISTULA PLACEMENT Left 04/28/2016   Procedure: REVISION OF LEFT RADIAL CEPHALIC ARTERIOVENOUS (AV) FISTULA  FOR DURABLE HEMODIALYSIS;  Surgeon: Vickie Epley, MD;  Location: [facility];  Service: Vascular;  Laterality: Left;  . CARDIAC CATHETERIZATION  2002   normal coronary arteries  . CENTRAL VENOUS CATHETER INSERTION Right 12/04/2015   Procedure: INSERTION OF TUNNELED HEMODIALYSIS CATHETER RIGHT INTERNAL JUGULAR;  Surgeon: Corene Cornea  Yolanda Bonine, MD;  Location: [facility];  Service: General;  Laterality: Right;  . REVISON OF ARTERIOVENOUS FISTULA Left 07/07/2016   Procedure: CREATION OF LEFT ARM BRACHIOCEPHALIC FISTULA;  Surgeon: Waynetta Sandy, MD;  Location: [place];  Service: Vascular;  Laterality: Left;     Inpatient Medications: Scheduled Meds: .  stroke: mapping our early stages of recovery book   Does not apply Once    . amLODipine  10 mg Oral Daily  . aspirin  300 mg Rectal Daily   Or  . aspirin  325 mg Oral Daily  . furosemide  40 mg Oral QPM  . heparin  5,000 Units Subcutaneous Q8H  . metoprolol tartrate  25 mg Oral QHS  . sevelamer carbonate  2.4 g Oral BID WC  . zolpidem  5 mg Oral QHS   Continuous Infusions:  PRN Meds: acetaminophen **OR** acetaminophen (TYLENOL) oral liquid 160 mg/5 mL **OR** acetaminophen, LORazepam, senna-docusate  Allergies:    Allergies  Allergen Reactions  . Other Other (See Comments)    IV contrast- Renal issues    Social History:   Social History   Social History  . Marital status: Married    Spouse name: N/A  . Number of children: N/A  . Years of education: N/A   Occupational History  . Not on file.   Social History Main Topics  . Smoking status: Current Every Day Smoker    Packs/day: 0.50    Years: 40.00    Types: Cigarettes    Start date: 05/12/1974  . Smokeless tobacco: Never Used     Comment: 1/2 pk per day  . Alcohol use No     Comment: occ  . Drug use: No  . Sexual activity: Not on file   Other Topics Concern  . Not on file   Social History Narrative  . No narrative on file    Family History:   The patient's family history includes Heart attack in his father; Hypertension in his mother.  ROS:  Please see the history of present illness. All other ROS reviewed and negative.     Physical Exam/Data:   Vitals:   09/26/16 0730 09/26/16 0745 09/26/16 0800 09/26/16 0830  BP: (!) 156/86 (!) 165/99 (!) 139/93   Pulse: 71 73 71   Resp: 14 13 13    Temp:      TempSrc:      SpO2: 96% 96% 98% 100%  Weight:      Height:        Intake/Output Summary (Last 24 hours) at 09/26/16 0859 Last data filed at 09/26/16 0000  Gross per 24 hour  Intake           249.17 ml  Output                0 ml  Net           249.17 ml   Filed Weights   09/25/16 1146 09/25/16 1603  Weight: 159 lb (72.1 kg) 153 lb (69.4 kg)   Body mass index is 19.64  kg/m.  General: Well developed, well nourished WM in no acute distress. Head: Normocephalic, atraumatic, sclera non-icteric, no xanthomas, nares are without discharge Neck: Negative for carotid bruits. JVD not elevated. Lungs: Clear bilaterally to auscultation without wheezes, rales, or rhonchi. Breathing is unlabored. Heart: RRR with S1 S2. No murmurs, rubs, or gallops appreciated. Abdomen: Soft, non-tender, non-distended with normoactive bowel sounds. No hepatomegaly. No rebound/guarding. No obvious abdominal masses. Msk:  Strength and tone appear normal for age. Extremities: No clubbing or cyanosis. No edema.  Distal pedal pulses are 2+ and equal bilaterally. Neuro: Alert and oriented X 3. No facial asymmetry. No focal deficit. Moves all extremities spontaneously but patient seems to move left arm awkwardly and with guarded posture. Psych:  Responds to questions appropriately with a normal affect.  EKG:  The EKG was personally reviewed and demonstrates NSR 75bpm, NSIVCD, TWI avL, no acute changes  Relevant CV Studies:  2D Echo 12/2015 Study Conclusions - Left ventricle: The cavity size was normal. Wall thickness was   increased in a pattern of mild LVH. Systolic function was normal.   The estimated ejection fraction was in the range of 55% to 60%.   Diastolic function is abnormal, indeterminate grade. Wall motion   was normal; there were no regional wall motion abnormalities. - Aortic valve: Mildly calcified annulus. Trileaflet; mildly   thickened leaflets. Valve area (VTI): 2.63 cm^2. Valve area   (Vmax): 2.56 cm^2. Valve area (Vmean): 2.31 cm^2. - Mitral valve: There was mild regurgitation. - Left atrium: The atrium was moderately dilated. - Atrial septum: No defect or patent foramen ovale was identified. - Pulmonary arteries: Systolic pressure was moderately increased.   PA peak pressure: 56 mm Hg (S). - Technically adequate study.  Laboratory Data:  Chemistry Recent  Labs Lab 09/25/16 1148 09/26/16 0438  NA 141 141  K 3.6 4.3  CL 99* 103  CO2 30 25  GLUCOSE 105* 78  BUN 8 16  CREATININE 3.11* 5.22*  CALCIUM 8.9 8.9  GFRNONAA 21* 11*  GFRAA 24* 13*  ANIONGAP 12 13     Recent Labs Lab 09/25/16 1148 09/26/16 0438  PROT 7.5 6.4*  ALBUMIN 4.2 3.5  AST 16 21  ALT 12* 13*  ALKPHOS 88 75  BILITOT 0.7 0.8   Hematology Recent Labs Lab 09/25/16 1148 09/26/16 0438  WBC 5.9 8.2  RBC 4.37 3.91*  HGB 13.2 11.8*  HCT 41.0 36.8*  MCV 93.8 94.1  MCH 30.2 30.2  MCHC 32.2 32.1  RDW 13.6 13.7  PLT 161 158   Cardiac Enzymes Recent Labs Lab 09/25/16 1150 09/25/16 1641 09/25/16 2240 09/26/16 0438  TROPONINI 0.04* 0.44* 0.42* 0.29*   No results for input(s): TROPIPOC in the last 168 hours.   Radiology/Studies:  Ct Head Wo Contrast  Result Date: 09/26/2016 CLINICAL DATA:  Weakness. Agitation kidney fusion. Patient refuses MRI. EXAM: CT HEAD WITHOUT CONTRAST TECHNIQUE: Contiguous axial images were obtained from the base of the skull through the vertex without intravenous contrast. COMPARISON:  09/25/2016 CT FINDINGS: Brain: No evidence of acute infarction, hemorrhage, hydrocephalus, extra-axial collection or mass lesion/mass effect. Vascular: No hyperdense vessel or unexpected calcification. Skull: Normal. Negative for fracture or focal lesion. Sinuses/Orbits: No acute finding. Other: None. IMPRESSION: No significant change from recent comparison. No acute intracranial abnormality. Electronically Signed   By: Ashley Royalty M.D.   On: 09/26/2016 04:25   Ct Head Wo Contrast  Result Date: 09/25/2016 CLINICAL DATA:  Code stroke. Right-sided numbness and weakness. Dizziness EXAM: CT HEAD WITHOUT CONTRAST TECHNIQUE: Contiguous axial images were obtained from the base of the skull through the vertex without intravenous contrast. COMPARISON:  CT head 01/19/2016 FINDINGS: Brain: No evidence of acute infarction, hemorrhage, hydrocephalus, extra-axial  collection or mass lesion/mass effect. Vascular: Negative for hyperdense vessel Skull: Negative Sinuses/Orbits: Negative Other: None IMPRESSION: Negative CT head Aspects score 10 These results were called by telephone at the time of interpretation on 09/25/2016  at 11:59 am to Dr. Francine Graven , who verbally acknowledged these results. Electronically Signed   By: Franchot Gallo M.D.   On: 09/25/2016 11:59    Assessment and Plan:   1. Elevated troponin - not clearly representative of ACS as he has not had any cardiac symptoms to correlate - no recent CP, SOB, or anginal-equivalent type limitation. EKG nonacute. He remains CP free. His troponin came back elevated prior to his seizure but raises the question of whether there was a primary neuro event driving this nonspecific elevation. He has been started on ASA. Consider changing Lopressor to Toprol as he is only on this once a day, unless he is on once-daily dosing for BP/HD reasons. Will await echo to help guide further decision making.  2.  Seizure - preceded by parasthesias yesterday, then full blown seizure and post-ictal state overnight. New onset. Further evaluation per IM. Consider neurology workup. Chart also indicates he has h/o ETOH so may consider CIWA protocol while inpatient. Of note, nurse note from last night indicates that telemetry had notified of patient's pulse increased to 160 at the time of seizure. Telemetry unavailable for review as he was transferred from floor to ICU. There is no report of any ventricular arrhythmia in overnight physician's cross cover note. Unclear if this represented artifact from seizure, sinus tach or other arrhythmia. He's maintained NSR since transfer so would continue to monitor on telemetry.  3. HTN - management per primary team.  Signed, Charlie Pitter, PA-C  09/26/2016 8:59 AM   Attending note:  Patient seen and examined. I reviewed records and discussed the case with Ms. Purcell Mouton. I agree with her  assessment. Patient presents with recent weakness and paresthesias during hemodialysis session yesterday, more recently has had newly documented seizures. Neurological workup is underway, head CT without acute findings. We are asked to evaluate the patient secondary to mildly abnormal troponin I levels in the absence of chest pain or documented arrhythmia so far. It is reported in the chart at the patient's heart rate was "160" at the time of one of his seizures, although telemetry is not available from this time. No specific arrhythmias have been uncovered based on available monitoring. Patient's cardiac history includes previous workup by Dr. Harrington Challenger last year for near-syncope, and it monitor was unremarkable in the absence of symptoms. He also underwent previous cardiac catheterization several years ago demonstrating normal coronary arteries. LVEF was normal range at 55-60% by echocardiogram last August.  On examination this morning he is awake, complains of no chest pain or breathlessness, no palpitations. Blood pressure fluctuating from normal to mildly elevated, heart rate in the 70s to 80s in sinus rhythm. Lungs exhibit clear breath sounds, cardiac exam with RRR and no significant gallop or rub, no distal pitting edema. Lab work shows peak troponin I 0.44. ECG shows sinus rhythm with IVCD and possible left atrial enlargement.   Elevated troponin I level, likely reflective of demand ischemia in the setting of physiologic stressors including recent seizures. Patient does not report chest pain, ECG shows no acute ST segment changes. No definite arrhythmias by available monitoring. At this point would recommend repeat echocardiogram to ensure stability in LVEF, mainly to exclude new cardiomyopathy or wall motion abnormalities that would require further workup. If LVEF remains normal, doubt that further ischemic workup will be needed at this time. Would continue telemetry monitoring to exclude any arrhythmias.  Continue with neurological evaluation.  Satira Sark, M.D., F.A.C.C.

## 2016-09-26 NOTE — Care Management (Signed)
    Durable Medical Equipment        Start     Ordered   09/26/16 1310  For home use only DME Walker rolling  Once    Question:  Patient needs a walker to treat with the following condition  Answer:  Weakness   09/26/16 1310

## 2016-09-26 NOTE — Progress Notes (Signed)
*  PRELIMINARY RESULTS* Echocardiogram 2D Echocardiogram has been performed.  Leavy Cella 09/26/2016, 9:12 AM

## 2016-09-26 NOTE — Evaluation (Signed)
Physical Therapy Evaluation Patient Details Name: Steven Sellers MRN: [id number] DOB: Jul 14, 1960 Today's Date: 09/26/2016   History of Present Illness  y.o. male with medical history significant of pulmonary embolism (7 years ago), HTN,  Anemia, Limb Girdle Muscular Dystrophy, ESRD on dialysis (T, Th, S) presents to the ED from dialysis today.  Patient reports he was receiving heparin in his catheter and reports the tech pushed the heparin very fast and he felt very loopy.  About 5 minutes later he reported he felt like his arms, legs and back fell asleep and felt all tingling. He sensation began in his left arm and then spread to his other arm and legs bilaterally as well his back.  No changes in vision at that time.  He reports he was unable to move his left arm or fingers at that time.  He was able to move his other extremities at time of event but extremities felt a tingling sensation.  EMS was called while patient was at dialysis.  Head CT is (-).  Early morning of 5/18, pt had a seizure and went into post-ictal state.  Repeat Head CT (-), and pt was transferred to the ICU.      Clinical Impression  Pt received in bed, and was agreeable to PT evaluation.  Pt expressed that he is normally independent with ambulation, ADL's, and IADL's.  During today's PT evaluation, he demonstrates generalized weakness, and decrease in balance.  He required Min A for sit<>Stand and Min guard with RW for gait x 258ft due to LOB.  He states that his L UE and LE feel heavy, however he declines any numbness on that side.  Pt is recommended for OPPT to improve balance, and strength.  He is also recommended for a RW upon d/c due to current balance deficits.      Follow Up Recommendations Outpatient PT    Equipment Recommendations  Rolling walker with 5" wheels    Recommendations for Other Services       Precautions / Restrictions Precautions Precautions: None Restrictions Weight Bearing Restrictions: No       Mobility  Bed Mobility Overal bed mobility: Modified Independent                Transfers Overall transfer level: Needs assistance Equipment used: Rolling walker (2 wheeled) Transfers: Sit to/from Stand Sit to Stand: Min assist            Ambulation/Gait Ambulation/Gait assistance: Min guard Ambulation Distance (Feet): 200 Feet Assistive device: Rolling walker (2 wheeled) Gait Pattern/deviations: Step-through pattern;Decreased step length - left;Trunk flexed     General Gait Details: Posture is notable for head forward and kyphotic position, and pt demonstrated 2 moderate LOB during ambulation.   He was able to ambulate the first 46ft without the RW, however he demonstrated high guarded positioning, and was unsteady, therefore RW given, which improved his speed, fluency, and stability of gait.   Stairs            Wheelchair Mobility    Modified Rankin (Stroke Patients Only)       Balance Overall balance assessment: Needs assistance Sitting-balance support: Bilateral upper extremity supported;Feet supported Sitting balance-Leahy Scale: Good     Standing balance support: Bilateral upper extremity supported Standing balance-Leahy Scale: Fair                               Pertinent Vitals/Pain Pain Assessment: No/denies pain  Home Living   Living Arrangements: Spouse/significant other Available Help at Discharge: Available 24 hours/day Type of Home: House Home Access: Stairs to enter   [employer] of Steps: 2 Home Layout: One level Home Equipment: Toilet riser;Cane - single point      Prior Function Level of Independence: Independent with assistive device(s)   Gait / Transfers Assistance Needed: Pt would occasionally use the cane for ambulation, but mostly is independent.    ADL's / Homemaking Assistance Needed: independent  Comments: driving     Hand Dominance   Dominant Hand: Right    Extremity/Trunk  Assessment   Upper Extremity Assessment Upper Extremity Assessment: Generalized weakness (B chronic shoulder weaknes related to his MD.  Able to flex to ~90*)    Lower Extremity Assessment Lower Extremity Assessment: Generalized weakness (B hip weakness noted during functional mobility)       Communication   Communication: No difficulties  Cognition Arousal/Alertness: Awake/alert Behavior During Therapy: WFL for tasks assessed/performed Overall Cognitive Status: Within Functional Limits for tasks assessed                                        General Comments      Exercises     Assessment/Plan    PT Assessment Patient needs continued PT services  PT Problem List Decreased strength;Decreased activity tolerance;Decreased balance;Decreased mobility       PT Treatment Interventions DME instruction;Gait training;Functional mobility training;Therapeutic activities;Therapeutic exercise;Balance training;Patient/family education;Neuromuscular re-education    PT Goals (Current goals can be found in the Care Plan section)  Acute Rehab PT Goals Patient Stated Goal: To go home PT Goal Formulation: With patient Time For Goal Achievement: 10/03/16 Potential to Achieve Goals: Good    Frequency Min 3X/week   Barriers to discharge        Co-evaluation               AM-PAC PT "6 Clicks" Daily Activity  Outcome Measure Difficulty turning over in bed (including adjusting bedclothes, sheets and blankets)?: None Difficulty moving from lying on back to sitting on the side of the bed? : None Difficulty sitting down on and standing up from a chair with arms (e.g., wheelchair, bedside commode, etc,.)?: A Little Help needed moving to and from a bed to chair (including a wheelchair)?: A Little Help needed walking in hospital room?: A Little Help needed climbing 3-5 steps with a railing? : A Little 6 Click Score: 20    End of Session Equipment Utilized During  Treatment: Gait belt Activity Tolerance: Patient tolerated treatment well Patient left: in chair;with call bell/phone within reach Nurse Communication: Mobility status (mobility sheet left up in the pt's room. ) PT Visit Diagnosis: Muscle weakness (generalized) (M62.81);Other abnormalities of gait and mobility (R26.89);Other symptoms and signs involving the nervous system (R29.898)    Time: 1107-1130 PT Time Calculation (min) (ACUTE ONLY): 23 min   Charges:   PT Evaluation $PT Eval Low Complexity: 1 Procedure PT Treatments $Gait Training: 8-22 mins   PT G Codes:   PT G-Codes **NOT FOR INPATIENT CLASS** Functional Assessment Tool Used: AM-PAC 6 Clicks Basic Mobility;Clinical judgement Functional Limitation: Mobility: Walking and moving around Mobility: Walking and Moving Around Current Status (S0109): At least 20 percent but less than 40 percent impaired, limited or restricted Mobility: Walking and Moving Around Goal Status [phone]): At least 1 percent but less than 20 percent impaired, limited  or restricted    Tacy Learn, PT, DPT X: [phone]

## 2016-09-26 NOTE — Consult Note (Signed)
Steven Sellers MRN: [id number] DOB/AGE: 1960-11-11 56 y.o. Primary Care Physician:Golding, Jenny Reichmann, MD Admit date: 09/25/2016 Chief Complaint:  Chief Complaint  Patient presents with  . Code Stroke   HPI: Pt is a 56 y.o. male with medical history significant of pulmonary embolism , HTN,  Anemia, ESRD on dialysis (Tuesday, Thursday, Saturday who was sent to ER with c/o  Left arm weakness.  HPI dates back to yesterday when he received heparin in his catheter and later  reported his arms, legs and back fell asleep and felt all tingling. Pt later complained  That this feeling that began in his left arm and then spread to his other arm and legs bilaterally as well his back. Pt offers no changes in vision at that time.  He reports he was unable to move his left arm or fingers at that time.  EMS was called and pt was sent to ER. In ER pt had CT scan which was negative. Pty later had witnessed seizure and was admitted for further care. Pt was initially refusing MRI but is now agreeable. NO c/o chest pain NO c/o fever/cough/chills NO c/o nausea/vomiting No c/o abdominal pain  Past Medical History:  Diagnosis Date  . Alcohol use   . Anemia   . ESRD (end stage renal disease) ([place])    Hemodialysis TTHS- [facility] in [place]  . Hypertension   . 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 Pulmonary embolism ([place]) 2011  . Tobacco abuse   . Vision abnormalities         Family History  Problem Relation Age of Onset  . Hypertension Mother     Social History:  reports that he has been smoking Cigarettes.  He started smoking about 42 years ago. He has a 20.00 pack-year smoking history. He has never used smokeless tobacco. He reports that he does not drink alcohol or use drugs.   Allergies:  Allergies  Allergen Reactions  . Other Other (See Comments)    IV contrast- Renal issues    Medications Prior to Admission  Medication Sig Dispense Refill  . acetaminophen (TYLENOL) 500 MG tablet  Take 1,000 mg by mouth every 6 (six) hours as needed for mild pain or moderate pain.    Marland Kitchen albuterol (PROVENTIL HFA;VENTOLIN HFA) 108 (90 Base) MCG/ACT inhaler Inhale 2 puffs into the lungs every 6 (six) hours as needed for wheezing or shortness of breath.    Marland Kitchen amLODipine (NORVASC) 5 MG tablet Take 10 mg by mouth daily.     . furosemide (LASIX) 20 MG tablet Take 40 mg by mouth every evening.    . metoprolol (LOPRESSOR) 50 MG tablet Take 25 mg by mouth at bedtime.     . sevelamer carbonate (RENVELA) 2.4 g PACK Take 2.4 g by mouth 2 (two) times daily with a meal. Sometimes 3 times a day with large meals  0  . zolpidem (AMBIEN) 10 MG tablet Take 5 mg by mouth at bedtime.   0       EHU:DJSHF from the symptoms mentioned above,there are no other symptoms referable to all systems reviewed.  .  stroke: mapping our early stages of recovery book   Does not apply Once  . amLODipine  10 mg Oral Daily  . aspirin  300 mg Rectal Daily   Or  . aspirin  325 mg Oral Daily  . furosemide  40 mg Oral QPM  . heparin  5,000 Units Subcutaneous Q8H  . metoprolol tartrate  25 mg Oral QHS  . sevelamer carbonate  2.4 g Oral BID WC  . zolpidem  5 mg Oral QHS       Physical Exam: Vital signs in last 24 hours: Temp:  [97.9 F (36.6 C)-98.8 F (37.1 C)] 98.8 F (37.1 C) (05/18 1300) Pulse Rate:  [56-80] 57 (05/18 1300) Resp:  [11-18] 15 (05/18 1300) BP: (115-233)/(68-99) 137/72 (05/18 1300) SpO2:  [94 %-100 %] 100 % (05/18 1300) Weight:  [153 lb (69.4 kg)] 153 lb (69.4 kg) (05/17 1603) Weight change:  Last BM Date: 09/25/16  Intake/Output from previous day: 05/17 0701 - 05/18 0700 In: 249.2 [P.O.:240; I.V.:9.2] Out: -  Total I/O In: 100 [P.O.:100] Out: -    Physical Exam: General- pt is awake,alert, oriented to time place and person Resp- No acute REsp distress,  NO Rhonchi CVS- S1S2 regular in rate and rhythm GIT- BS+, soft, NT, ND EXT- NO LE Edema, Cyanosis CNS- CN 2-12 grossly intact.  Moving all 4 extremities Psych- normal mood and affect Access- PC and   AVF   Lab Results: CBC  Recent Labs  09/25/16 1148 09/26/16 0438  WBC 5.9 8.2  HGB 13.2 11.8*  HCT 41.0 36.8*  PLT 161 158    BMET  Recent Labs  09/25/16 1148 09/26/16 0438  NA 141 141  K 3.6 4.3  CL 99* 103  CO2 30 25  GLUCOSE 105* 78  BUN 8 16  CREATININE 3.11* 5.22*  CALCIUM 8.9 8.9    MICRO Recent Results (from the past 240 hour(s))  MRSA PCR Screening     Status: None   Collection Time: 09/26/16  4:18 AM  Result Value Ref Range Status   MRSA by PCR NEGATIVE NEGATIVE Final    Comment:        The GeneXpert MRSA Assay (FDA approved for NASAL specimens only), is one component of a comprehensive MRSA colonization surveillance program. It is not intended to diagnose MRSA infection nor to guide or monitor treatment for MRSA infections.       Lab Results  Component Value Date   CALCIUM 8.9 09/26/2016   CAION 1.09 (L) 04/28/2016   PHOS 6.1 (H) 05/20/2016      Impression: 1)Renal  ESRD on HD                Pt is on Tues/Thursday/Saturday schedule                Pt was dialyzed yesterday                NO need of HD today                    2)HTN  Medication- On Diuretics On Calcium Channel Blockers On Beta blockers On Vasodilators-  3)Anemia In ESRD the target for HGb is 9--11     hgb at goal     No need of Epogen   4)CKD Mineral-Bone Disorder Phosphorus not at goal.      On BInders  Calcium is at goal.   5)Fluid status-Pt is euvolemic No need of Hd today  6)Electrolytes  Hyperkalemic   Now better  Hyponatremic   7)Acid base Co2 at goal  8) CNSa dmitted with seizures    Primary team and tele Neurology  following     Plan:  Pt is not getting IV gadolinium ,No need of Hd today No need of epo as HGb more than 11.5 Will dialyze in am  [place] S 09/26/2016, 2:15 PM

## 2016-09-27 DIAGNOSIS — R29898 Other symptoms and signs involving the musculoskeletal system: Secondary | ICD-10-CM | POA: Diagnosis not present

## 2016-09-27 DIAGNOSIS — R202 Paresthesia of skin: Secondary | ICD-10-CM | POA: Diagnosis not present

## 2016-09-27 DIAGNOSIS — D649 Anemia, unspecified: Secondary | ICD-10-CM | POA: Diagnosis not present

## 2016-09-27 DIAGNOSIS — N186 End stage renal disease: Secondary | ICD-10-CM | POA: Diagnosis not present

## 2016-09-27 DIAGNOSIS — Z992 Dependence on renal dialysis: Secondary | ICD-10-CM | POA: Diagnosis not present

## 2016-09-27 DIAGNOSIS — I10 Essential (primary) hypertension: Secondary | ICD-10-CM | POA: Diagnosis not present

## 2016-09-27 DIAGNOSIS — R569 Unspecified convulsions: Secondary | ICD-10-CM | POA: Diagnosis not present

## 2016-09-27 DIAGNOSIS — G710 Muscular dystrophy: Secondary | ICD-10-CM | POA: Diagnosis not present

## 2016-09-27 LAB — HEMOGLOBIN A1C
HEMOGLOBIN A1C: 5.4 % (ref 4.8–5.6)
Mean Plasma Glucose: 108 mg/dL

## 2016-09-27 LAB — CBC
HCT: 34.9 % — ABNORMAL LOW (ref 39.0–52.0)
Hemoglobin: 11.6 g/dL — ABNORMAL LOW (ref 13.0–17.0)
MCH: 31 pg (ref 26.0–34.0)
MCHC: 33.2 g/dL (ref 30.0–36.0)
MCV: 93.3 fL (ref 78.0–100.0)
Platelets: 150 10*3/uL (ref 150–400)
RBC: 3.74 MIL/uL — ABNORMAL LOW (ref 4.22–5.81)
RDW: 13.7 % (ref 11.5–15.5)
WBC: 6.2 10*3/uL (ref 4.0–10.5)

## 2016-09-27 LAB — RENAL FUNCTION PANEL
Albumin: 3.4 g/dL — ABNORMAL LOW (ref 3.5–5.0)
Anion gap: 9 (ref 5–15)
BUN: 24 mg/dL — ABNORMAL HIGH (ref 6–20)
CO2: 27 mmol/L (ref 22–32)
Calcium: 8.4 mg/dL — ABNORMAL LOW (ref 8.9–10.3)
Chloride: 101 mmol/L (ref 101–111)
Creatinine, Ser: 6.76 mg/dL — ABNORMAL HIGH (ref 0.61–1.24)
GFR calc Af Amer: 9 mL/min — ABNORMAL LOW (ref >60)
GFR calc non Af Amer: 8 mL/min — ABNORMAL LOW (ref >60)
Glucose, Bld: 120 mg/dL — ABNORMAL HIGH (ref 65–99)
Phosphorus: 4.3 mg/dL (ref 2.5–4.6)
Potassium: 4 mmol/L (ref 3.5–5.1)
Sodium: 137 mmol/L (ref 135–145)

## 2016-09-27 LAB — HEPATITIS B SURFACE ANTIGEN: Hepatitis B Surface Ag: NEGATIVE

## 2016-09-27 MED ORDER — LIDOCAINE HCL (PF) 1 % IJ SOLN: 5.0000 mL | INTRAMUSCULAR | Status: DC | PRN

## 2016-09-27 MED ORDER — HEPARIN SODIUM (PORCINE) 1000 UNIT/ML DIALYSIS
1000.0000 [IU] | INTRAMUSCULAR | Status: DC | PRN
  Administered 2016-09-27: 1000 [IU] via INTRAVENOUS_CENTRAL
  Filled 2016-09-27 (×2): qty 1

## 2016-09-27 MED ORDER — SODIUM CHLORIDE 0.9 % IV SOLN: 100.0000 mL | INTRAVENOUS | Status: DC | PRN

## 2016-09-27 MED ORDER — ALTEPLASE 2 MG IJ SOLR
2.0000 mg | Freq: Once | INTRAMUSCULAR | Status: DC | PRN
  Filled 2016-09-27: qty 2

## 2016-09-27 MED ORDER — LIDOCAINE-PRILOCAINE 2.5-2.5 % EX CREA
1.0000 "application " | TOPICAL_CREAM | CUTANEOUS | Status: DC | PRN
  Filled 2016-09-27: qty 5

## 2016-09-27 MED ORDER — HEPARIN SODIUM (PORCINE) 1000 UNIT/ML DIALYSIS
20.0000 [IU]/kg | INTRAMUSCULAR | Status: DC | PRN
  Filled 2016-09-27: qty 2

## 2016-09-27 MED ORDER — PENTAFLUOROPROP-TETRAFLUOROETH EX AERO
1.0000 "application " | INHALATION_SPRAY | CUTANEOUS | Status: DC | PRN
  Filled 2016-09-27: qty 30

## 2016-09-27 MED ORDER — HEPARIN SODIUM (PORCINE) 1000 UNIT/ML IJ SOLN
INTRAMUSCULAR | Status: AC
  Administered 2016-09-27: 1000 [IU] via INTRAVENOUS_CENTRAL
  Filled 2016-09-27: qty 1

## 2016-09-27 MED ORDER — ASPIRIN EC 81 MG PO TBEC: 81.0000 mg | DELAYED_RELEASE_TABLET | Freq: Every day | ORAL | 0 refills | Status: DC

## 2016-09-27 NOTE — Progress Notes (Signed)
Subjective:  Seen on dialysis with Dr. Theador Hawthorne; no complaints, comfortable, tolerating well  Objective: Vital signs in last 24 hours: Temp:  [97.8 F (36.6 C)-98.8 F (37.1 C)] 98 F (36.7 C) (05/19 0800) Pulse Rate:  [57-80] 68 (05/19 0800) Resp:  [11-20] 14 (05/19 0800) BP: (132-164)/(70-94) 157/82 (05/19 0800) SpO2:  [96 %-100 %] 99 % (05/19 0800) Weight:  [68.1 kg (150 lb 2.1 oz)] 68.1 kg (150 lb 2.1 oz) (05/19 0730) Weight change: -4.022 kg (-8 lb 13.9 oz)  Intake/Output from previous day: 05/18 0701 - 05/19 0700 In: 390 [P.O.:390] Out: -  Intake/Output this shift: No intake/output data recorded.   EXAM: General appearance:  Alert, in no apparent distress Resp:  Clear to auscultation bilaterally Cardio:  RRR without murmur or rub GI: + BS, soft and nontender Extremities:   No edema Access:  Using R IJ catheter with BFR 400, also with AVF @ LUA, resting secondary to recent infiltration  Lab Results:  Recent Labs  09/26/16 0438 09/27/16 0753  WBC 8.2 6.2  HGB 11.8* 11.6*  HCT 36.8* 34.9*  PLT 158 150   BMET:  Recent Labs  09/26/16 0438 09/27/16 0753  NA 141 137  K 4.3 4.0  CL 103 101  CO2 25 27  GLUCOSE 78 120*  BUN 16 24*  CREATININE 5.22* 6.76*  CALCIUM 8.9 8.4*  ALBUMIN 3.5 3.4*   No results for input(s): PTH in the last 72 hours. Iron Studies: No results for input(s): IRON, TIBC, TRANSFERRIN, FERRITIN in the last 72 hours.  Studies/Results: Mr Brain Wo Contrast  Result Date: 09/26/2016 CLINICAL DATA:  Right-sided weakness for 2 days.  Witnessed seizure. EXAM: MRI HEAD WITHOUT CONTRAST TECHNIQUE: Multiplanar, multiecho pulse sequences of the brain and surrounding structures were obtained without intravenous contrast. COMPARISON:  Head CT 09/26/2016 and MRI 04/11/2015 FINDINGS: The study is mildly motion degraded. Brain: There is a very small amount of gyral diffusion-weighted signal abnormality in the right precentral gyrus in the hand motor region  without clearly reduced ADC. There may be minimal involvement of the adjacent postcentral gyrus. There is no significant gyral swelling, although there is a new 3 mm focus of subcortical FLAIR hyperintensity in this region. Similar punctate foci of T2 hyperintensity scattered elsewhere in the cerebral white matter bilaterally also have minimally increased in number from the prior MRI. There is no evidence of intracranial hemorrhage, mass, midline shift, or extra-axial fluid collection. The mesial temporal lobes are symmetric and normal in appearance within limitations of mild motion artifact. Very mild cerebral and cerebellar atrophy are unchanged from the prior MRI. Vascular: Major intracranial vascular flow voids are preserved. Skull and upper cervical spine: Unremarkable bone marrow signal. Sinuses/Orbits: Unremarkable orbits Paranasal sinuses and mastoid air cells are clear. Other: None. IMPRESSION: 1. Small amount of cortical diffusion signal abnormality in the right precentral gyrus without clearly reduced ADC, potentially a sequelae of recent seizure activity given its appearance and the patient's history. A small acute infarct is considered less likely. 2. No other evidence of acute intracranial abnormality. 3. Scattered foci of cerebral white matter T2 signal abnormality, slightly progressed from 2016. These are nonspecific but may reflect minimal chronic small vessel ischemic disease. Electronically Signed   By: Logan Bores M.D.   On: 09/26/2016 16:43   US Carotid Bilateral (at [facility] And [facility] Only)  Result Date: 09/26/2016 CLINICAL DATA:  56 year old male with weakness and new onset seizure EXAM: BILATERAL CAROTID DUPLEX ULTRASOUND TECHNIQUE: Pearline Cables scale imaging, color Doppler  and duplex ultrasound were performed of bilateral carotid and vertebral arteries in the neck. COMPARISON:  None. FINDINGS: Criteria: Quantification of carotid stenosis is based on velocity parameters that correlate the residual  internal carotid diameter with NASCET-based stenosis levels, using the diameter of the distal internal carotid lumen as the denominator for stenosis measurement. The following velocity measurements were obtained: RIGHT ICA:  105/31 cm/sec CCA:  15/17 cm/sec SYSTOLIC ICA/CCA RATIO:  1.4 DIASTOLIC ICA/CCA RATIO:  2.9 ECA:  89 cm/sec LEFT ICA:  100/32 cm/sec CCA:  61/60 cm/sec SYSTOLIC ICA/CCA RATIO:  1.2 DIASTOLIC ICA/CCA RATIO:  2.5 ECA:  114 cm/sec RIGHT CAROTID ARTERY: Trace heterogeneous atherosclerotic plaque without evidence of stenosis. RIGHT VERTEBRAL ARTERY:  Patent with normal antegrade flow. LEFT CAROTID ARTERY: Trace heterogeneous atherosclerotic plaque without evidence of stenosis. LEFT VERTEBRAL ARTERY:  Patent with normal antegrade flow. IMPRESSION: 1. Trace heterogeneous atherosclerotic plaque in both internal carotid arteries without evidence of stenosis. 2. The vertebral arteries remain patent with normal antegrade flow. Signed, Criselda Peaches, MD [facility] Radiology Electronically Signed   By: Jacqulynn Cadet M.D.   On: 09/26/2016 12:56     Assessment: 1.   ESRD - on HD on TTS @ [facility], currently stable on dialysis. 2.   HTN - BP well controlled on Amlodipine, Metoprolol, & Furosemide. 3.   Anemia - Hgb at goal, no Epogen. 4.   Metabolic bone disease - corrected Ca 8.9, P 4.2 on Renvela powder. 5.   Fluid status - no signs or symptoms of fluid overload, UF goal of 2 L. 6.   Hyperkalemia - K stable. 7.   Acid-base - CO2 at goal. 8.   Paresthesia / Seizure - Primary team and tele Neurology following.  Plan: Dialysis today   LOS: 0 days   Steven Sellers 09/27/2016,9:01 AM

## 2016-09-27 NOTE — Discharge Summary (Signed)
Physician Discharge Summary  Steven Sellers DUK:[id number] DOB: 14-Aug-1960 DOA: 09/25/2016  PCP: Sharilyn Sites, MD  Admit date: 09/25/2016 Discharge date: 09/28/2016  Admitted From: Home Disposition:  Home  Recommendations for Outpatient Follow-up:  1. Follow up with PCP in 1-2 weeks 2. Follow up with neurology within 4 weeks 3. Return to regular dialysis schedule 4. 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 Patient reports he was receiving heparin in his catheter and reports the tech pushed the heparin very fast and he felt very loopy. About 5 minutes later he reported he felt like his arms, legs and back fell asleep and felt all tingling. He sensation began in his left arm and then spread to his other arm and legs bilaterally as well his back. No changes in vision at that time. He reports he was unable to move his left arm or fingers at that time. He was able to move his other extremities at time of event but extremities felt a tingling sensation. EMS was called while patient was at dialysis.  Patient had returned to baseline by time he was brought to the emergency room.  He underwent CT scan of the head which was negative.  He refused MRI.  Early am on 5/18 he was found to be seizing.  He does not have a history of seizures.  He was given a dose of keppra and ativan.  Neurology was consulted however Dr. Merlene Laughter not available.  Dr. Shon Hale, neurologist at [facility], called and case was discussed.  Patient underwent MRI which showed sequealae of seizure but no etiology for seizure or stroke.  Dr. Shon Hale states patient does not need EEG or antiepileptic but should  follow up with a neurologist.  Prior to discharge patient did undergo dialysis.  He was encouraged to follow up with his normal dialysis schedule.  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[place])   Anemia   ESRD needing dialysis (HCC)   CKD (chronic kidney disease) requiring chronic dialysis (HCC)   Left arm weakness   First time seizure [facility])    Discharge Instructions  Discharge Instructions    Call MD for:  difficulty breathing, headache or visual disturbances    Complete by:  As directed    Call MD for:  extreme fatigue    Complete by:  As directed    Call MD for:  hives    Complete by:  As directed    Call MD for:  persistant dizziness or light-headedness    Complete by:  As directed    Call MD for:  persistant nausea and vomiting    Complete by:  As directed    Call MD for:  severe uncontrolled pain    Complete by:  As directed    Call MD for:  temperature >100.4    Complete by:  As directed    Diet - low sodium heart healthy    Complete by:  As directed    For home use only DME 4 wheeled rolling walker with seat    Complete by:  As directed    Patient needs a walker to treat with the following condition:  Muscular dystrophy ([place])   Increase activity slowly    Complete by:  As directed      Allergies as of 09/27/2016      Reactions   Other Other (See Comments)   IV contrast- Renal issues      Medication List    TAKE these medications   acetaminophen 500 MG tablet Commonly known as:  TYLENOL Take 1,000 mg by mouth every 6 (six) hours as needed for mild pain or moderate pain.   albuterol 108 (90 Base) MCG/ACT inhaler Commonly known as:  PROVENTIL HFA;VENTOLIN HFA Inhale 2 puffs into the lungs every 6 (six) hours as needed for wheezing or shortness of breath.   amLODipine 5 MG tablet Commonly known as:  NORVASC Take 10 mg by mouth daily.   aspirin EC 81 MG tablet Take 1 tablet (81 mg total) by mouth  daily.   furosemide 20 MG tablet Commonly known as:  LASIX Take 40 mg by mouth every evening.   metoprolol tartrate 50 MG tablet Commonly known as:  LOPRESSOR Take 25 mg by mouth at bedtime.   sevelamer carbonate 2.4 g Pack Commonly known as:  RENVELA Take 2.4 g by mouth 2 (two) times daily with a meal. Sometimes 3 times a day with large meals   zolpidem 10 MG tablet Commonly known as:  AMBIEN Take 5 mg by mouth at bedtime.            Durable Medical Equipment        Start     Ordered   09/27/16 0000  For home use only DME 4 wheeled rolling walker with seat    Question:  Patient needs a walker to treat with the following condition  Answer:  Muscular dystrophy ([place])   09/27/16 1330     Follow-up Information    Mosie Epstein, MD. Schedule an appointment as soon as possible for a visit in 2 week(s).   Specialty:  Neurology Contact information: [facility] [place] [address] [phone]        Sharilyn Sites, MD. Schedule an appointment as soon as possible for a visit in 1 week(s).   Specialty:  Family Medicine Contact information: [address] [place] [address] [phone]          Allergies  Allergen Reactions  . Other Other (See Comments)    IV contrast- Renal issues    Consultations:  Cardiology  PT  Neurology on the phone    Procedures/Studies: Ct Head Wo Contrast  Result Date: 09/26/2016 CLINICAL DATA:  Weakness. Agitation kidney fusion. Patient refuses MRI. EXAM: CT HEAD WITHOUT CONTRAST TECHNIQUE: Contiguous axial images were obtained from the base of the skull through the vertex without intravenous contrast. COMPARISON:  09/25/2016 CT FINDINGS: Brain: No evidence of acute infarction, hemorrhage, hydrocephalus, extra-axial collection or mass lesion/mass effect. Vascular: No hyperdense vessel or unexpected calcification. Skull: Normal. Negative for fracture or focal lesion.  Sinuses/Orbits: No acute finding. Other: None. IMPRESSION: No significant change from recent comparison. No acute intracranial abnormality. Electronically Signed   By: Ashley Royalty M.D.   On: 09/26/2016 04:25   Ct Head Wo Contrast  Result Date: 09/25/2016 CLINICAL DATA:  Code stroke. Right-sided numbness and weakness. Dizziness EXAM: CT HEAD WITHOUT CONTRAST TECHNIQUE: Contiguous axial images were obtained from the base of the skull through the vertex without intravenous contrast. COMPARISON:  CT head 01/19/2016 FINDINGS: Brain: No evidence of acute infarction, hemorrhage, hydrocephalus, extra-axial collection or  mass lesion/mass effect. Vascular: Negative for hyperdense vessel Skull: Negative Sinuses/Orbits: Negative Other: None IMPRESSION: Negative CT head Aspects score 10 These results were called by telephone at the time of interpretation on 09/25/2016 at 11:59 am to Dr. Francine Graven , who verbally acknowledged these results. Electronically Signed   By: Franchot Gallo M.D.   On: 09/25/2016 11:59   Mr Brain Wo Contrast  Result Date: 09/26/2016 CLINICAL DATA:  Right-sided weakness for 2 days.  Witnessed seizure. EXAM: MRI HEAD WITHOUT CONTRAST TECHNIQUE: Multiplanar, multiecho pulse sequences of the brain and surrounding structures were obtained without intravenous contrast. COMPARISON:  Head CT 09/26/2016 and MRI 04/11/2015 FINDINGS: The study is mildly motion degraded. Brain: There is a very small amount of gyral diffusion-weighted signal abnormality in the right precentral gyrus in the hand motor region without clearly reduced ADC. There may be minimal involvement of the adjacent postcentral gyrus. There is no significant gyral swelling, although there is a new 3 mm focus of subcortical FLAIR hyperintensity in this region. Similar punctate foci of T2 hyperintensity scattered elsewhere in the cerebral white matter bilaterally also have minimally increased in number from the prior MRI. There is no  evidence of intracranial hemorrhage, mass, midline shift, or extra-axial fluid collection. The mesial temporal lobes are symmetric and normal in appearance within limitations of mild motion artifact. Very mild cerebral and cerebellar atrophy are unchanged from the prior MRI. Vascular: Major intracranial vascular flow voids are preserved. Skull and upper cervical spine: Unremarkable bone marrow signal. Sinuses/Orbits: Unremarkable orbits Paranasal sinuses and mastoid air cells are clear. Other: None. IMPRESSION: 1. Small amount of cortical diffusion signal abnormality in the right precentral gyrus without clearly reduced ADC, potentially a sequelae of recent seizure activity given its appearance and the patient's history. A small acute infarct is considered less likely. 2. No other evidence of acute intracranial abnormality. 3. Scattered foci of cerebral white matter T2 signal abnormality, slightly progressed from 2016. These are nonspecific but may reflect minimal chronic small vessel ischemic disease. Electronically Signed   By: Logan Bores M.D.   On: 09/26/2016 16:43   US Carotid Bilateral (at [facility] Only)  Result Date: 09/26/2016 CLINICAL DATA:  56 year old male with weakness and new onset seizure EXAM: BILATERAL CAROTID DUPLEX ULTRASOUND TECHNIQUE: Pearline Cables scale imaging, color Doppler and duplex ultrasound were performed of bilateral carotid and vertebral arteries in the neck. COMPARISON:  None. FINDINGS: Criteria: Quantification of carotid stenosis is based on velocity parameters that correlate the residual internal carotid diameter with NASCET-based stenosis levels, using the diameter of the distal internal carotid lumen as the denominator for stenosis measurement. The following velocity measurements were obtained: RIGHT ICA:  105/31 cm/sec CCA:  46/27 cm/sec SYSTOLIC ICA/CCA RATIO:  1.4 DIASTOLIC ICA/CCA RATIO:  2.9 ECA:  89 cm/sec LEFT ICA:  100/32 cm/sec CCA:  03/50 cm/sec SYSTOLIC ICA/CCA RATIO:   1.2 DIASTOLIC ICA/CCA RATIO:  2.5 ECA:  114 cm/sec RIGHT CAROTID ARTERY: Trace heterogeneous atherosclerotic plaque without evidence of stenosis. RIGHT VERTEBRAL ARTERY:  Patent with normal antegrade flow. LEFT CAROTID ARTERY: Trace heterogeneous atherosclerotic plaque without evidence of stenosis. LEFT VERTEBRAL ARTERY:  Patent with normal antegrade flow. IMPRESSION: 1. Trace heterogeneous atherosclerotic plaque in both internal carotid arteries without evidence of stenosis. 2. The vertebral arteries remain patent with normal antegrade flow. Signed, Criselda Peaches, MD [facility] Electronically Signed   By: Jacqulynn Cadet M.D.   On: 09/26/2016 12:56   Echo: Mild LVH and  mild LV chamber dilatation with LVEF 55-60% and grade 1 diastolic dysfunction. Upper normal left atrial chamber size. Mild mitral regurgitation. Sclerotic aortic valve without stenosis. Trivial tricuspid regurgitation with PASP 41 mmHg   Subjective: Patient reports he feels well today.  Says he was able to tolerate the MRI without concern.  Discharge Exam: Vitals:   09/27/16 1300 09/27/16 1400  BP: 130/78 122/76  Pulse: 70 76  Resp: 14 16  Temp:     Vitals:   09/27/16 1150 09/27/16 1200 09/27/16 1300 09/27/16 1400  BP: 127/79 (!) 148/82 130/78 122/76  Pulse:  79 70 76  Resp:  18 14 16   Temp: 98.1 F (36.7 C)     TempSrc: Oral     SpO2:  100% 98% 96%  Weight:      Height:        General: Pt is alert, awake, not in acute distress Cardiovascular: RRR, S1/S2 +, no rubs, no gallops Respiratory: CTA bilaterally, no wheezing, no rhonchi Abdominal: Soft, NT, ND, bowel sounds + Extremities: no edema, no cyanosis, thrill in the left upper extremity with bruising of left upper extremity    The results of significant diagnostics from this hospitalization (including imaging, microbiology, ancillary and laboratory) are listed below for reference.      Microbiology: Recent Results (from the past 240 hour(s))  MRSA PCR Screening     Status: None   Collection Time: 09/26/16  4:18 AM  Result Value Ref Range Status   MRSA by PCR NEGATIVE NEGATIVE Final    Comment:        The GeneXpert MRSA Assay (FDA approved for NASAL specimens only), is one component of a comprehensive MRSA colonization surveillance program. It is not intended to diagnose MRSA infection nor to guide or monitor treatment for MRSA infections.      Labs: BNP (last 3 results)  Recent Labs  12/15/15 2310  BNP 2,423.5*   Basic Metabolic Panel:  Recent Labs Lab 09/25/16 1148 09/26/16 0438 09/27/16 0753  NA 141 141 137  K 3.6 4.3 4.0  CL 99* 103 101  CO2 30 25 27   GLUCOSE 105* 78 120*  BUN 8 16 24*  CREATININE 3.11* 5.22* 6.76*  CALCIUM 8.9 8.9 8.4*  MG  --  2.2  --   PHOS  --   --  4.3   Liver Function Tests:  Recent Labs Lab 09/25/16 1148 09/26/16 0438 09/27/16 0753  AST 16 21  --   ALT 12* 13*  --   ALKPHOS 88 75  --   BILITOT 0.7 0.8  --   PROT 7.5 6.4*  --   ALBUMIN 4.2 3.5 3.4*   No results for input(s): LIPASE, AMYLASE in the last 168 hours. No results for input(s): AMMONIA in the last 168 hours. CBC:  Recent Labs Lab 09/25/16 1148 09/26/16 0438 09/27/16 0753  WBC 5.9 8.2 6.2  NEUTROABS 3.6 6.1  --   HGB 13.2 11.8* 11.6*  HCT 41.0 36.8* 34.9*  MCV 93.8 94.1 93.3  PLT 161 158 150   Cardiac Enzymes:  Recent Labs Lab 09/25/16 1150 09/25/16 1641 09/25/16 2240 09/26/16 0438  TROPONINI 0.04* 0.44* 0.42* 0.29*   BNP: Invalid input(s): POCBNP CBG:  Recent Labs Lab 09/26/16 0313  GLUCAP 102*   D-Dimer No results for input(s): DDIMER in the last 72 hours. Hgb A1c No results for input(s): HGBA1C in the last 72 hours. Lipid Profile  Recent Labs  09/26/16 0438  CHOL 128  HDL 42  LDLCALC 66  TRIG 99  CHOLHDL 3.0   Thyroid function studies No results for input(s): TSH, T4TOTAL, T3FREE, THYROIDAB in  the last 72 hours.  Invalid input(s): FREET3 Anemia work up No results for input(s): VITAMINB12, FOLATE, FERRITIN, TIBC, IRON, RETICCTPCT in the last 72 hours. Urinalysis    Component Value Date/Time   COLORURINE YELLOW 05/21/2016 [place] 05/21/2016 0941   LABSPEC 1.020 05/21/2016 0941   PHURINE 8.0 05/21/2016 0941   GLUCOSEU NEGATIVE 05/21/2016 0941   HGBUR LARGE (A) 05/21/2016 0941   BILIRUBINUR NEGATIVE 05/21/2016 0941   KETONESUR NEGATIVE 05/21/2016 0941   PROTEINUR >300 (A) 05/21/2016 0941   UROBILINOGEN 0.2 04/11/2010 2336   NITRITE NEGATIVE 05/21/2016 0941   LEUKOCYTESUR NEGATIVE 05/21/2016 0941   Sepsis Labs Invalid input(s): PROCALCITONIN,  WBC,  LACTICIDVEN Microbiology Recent Results (from the past 240 hour(s))  MRSA PCR Screening     Status: None   Collection Time: 09/26/16  4:18 AM  Result Value Ref Range Status   MRSA by PCR NEGATIVE NEGATIVE Final    Comment:        The GeneXpert MRSA Assay (FDA approved for NASAL specimens only), is one component of a comprehensive MRSA colonization surveillance program. It is not intended to diagnose MRSA infection nor to guide or monitor treatment for MRSA infections.      Time coordinating discharge: 40 minutes  SIGNED:   Loretha Stapler, MD  [facility] 09/28/2016, 6:17 PM Pager [phone] If 7PM-7AM, please contact night-coverage [email or web address] Password [email or web address]

## 2016-09-27 NOTE — Progress Notes (Signed)
1133 call, patient in route 1137 call time 1139 beeper 1148 exam started 1150 exam finished 1150 images sent to [place] exam completed in [facility] called

## 2016-09-27 NOTE — Progress Notes (Signed)
Patient discharged per order. IV access removed. Patient did received dialysis today and tolerated well. Vitals listed. Taken down to car by wheelchair.

## 2016-10-15 ENCOUNTER — Other Ambulatory Visit: Payer: Self-pay | Admitting: *Deleted

## 2016-10-15 DIAGNOSIS — T82510A Breakdown (mechanical) of surgically created arteriovenous fistula, initial encounter: Secondary | ICD-10-CM

## 2016-10-15 NOTE — Addendum Note (Signed)
Addended by: Lianne Cure A on: 10/15/2016 01:57 PM   Modules accepted: Orders

## 2016-10-16 ENCOUNTER — Encounter: Payer: Self-pay | Admitting: Vascular Surgery

## 2016-10-22 ENCOUNTER — Ambulatory Visit (HOSPITAL_COMMUNITY)
Admission: RE | Admit: 2016-10-22 | Discharge: 2016-10-22 | Disposition: A | Discharge status: Home / Self Care | Payer: BLUE CROSS/BLUE SHIELD | Source: Ambulatory Visit | Attending: Surgery | Admitting: Surgery

## 2016-10-22 DIAGNOSIS — N186 End stage renal disease: Secondary | ICD-10-CM | POA: Diagnosis present

## 2016-10-22 DIAGNOSIS — Z992 Dependence on renal dialysis: Secondary | ICD-10-CM

## 2016-10-24 ENCOUNTER — Ambulatory Visit (INDEPENDENT_AMBULATORY_CARE_PROVIDER_SITE_OTHER): Payer: BLUE CROSS/BLUE SHIELD | Admitting: Vascular Surgery

## 2016-10-24 ENCOUNTER — Other Ambulatory Visit: Payer: Self-pay | Admitting: *Deleted

## 2016-10-24 ENCOUNTER — Encounter: Payer: Self-pay | Admitting: *Deleted

## 2016-10-24 ENCOUNTER — Encounter: Payer: Self-pay | Admitting: Vascular Surgery

## 2016-10-24 VITALS — BP 136/77 | HR 51 | Temp 97.1°F | Resp 20 | Ht 74.0 in | Wt 153.6 lb

## 2016-10-24 DIAGNOSIS — Z992 Dependence on renal dialysis: Secondary | ICD-10-CM

## 2016-10-24 DIAGNOSIS — N186 End stage renal disease: Secondary | ICD-10-CM | POA: Diagnosis not present

## 2016-10-24 NOTE — Progress Notes (Signed)
Patient ID: TANMAY HALTEMAN, male   DOB: 1960-11-21, 55 y.o.   MRN: [id number]  Reason for Consult: Re-evaluation (difficult canulation -c/o moderate pain and swelling -Dr. Lowanda Foster - HD TTS)   Referred by Sharilyn Sites, MD  Subjective:     HPI:  DE JAWORSKI is a 56 y.o. male with recent conversion of left radiocephalic to brachiocephalic AV fistula. He is currently dialyzing via a catheter and hasn't had attempted cannulation of his fistula although this has led to hematoma. He is now here for further evaluation and had dialysis duplex today.  Past Medical History:  Diagnosis Date  . Alcohol use   . Anemia   . ESRD (end stage renal disease) ([place])    Hemodialysis TTHS- [facility] in [place]  . Hypertension   . Muscular dystrophy ([place])    Limb Girdle  . Pulmonary embolism ([place]) 2011  . Tobacco abuse   . Vision abnormalities    Family History  Problem Relation Age of Onset  . Hypertension Mother    Past Surgical History:  Procedure Laterality Date  . AV FISTULA PLACEMENT Left 12/26/2015   Procedure: CREATION OF LEFT RADIAL-CEPHALIC ARTERIOVENOUS FISTULA FOR HEMODIALYSIS;  Surgeon: Vickie Epley, MD;  Location: [facility];  Service: Vascular;  Laterality: Left;  . AV FISTULA PLACEMENT Left 04/28/2016   Procedure: REVISION OF LEFT RADIAL CEPHALIC ARTERIOVENOUS (AV) FISTULA  FOR DURABLE HEMODIALYSIS;  Surgeon: Vickie Epley, MD;  Location: [facility];  Service: Vascular;  Laterality: Left;  . CARDIAC CATHETERIZATION  2002   normal coronary arteries  . CENTRAL VENOUS CATHETER INSERTION Right 12/04/2015   Procedure: INSERTION OF TUNNELED HEMODIALYSIS CATHETER RIGHT INTERNAL JUGULAR;  Surgeon: Vickie Epley, MD;  Location: [facility];  Service: General;  Laterality: Right;  . REVISON OF ARTERIOVENOUS FISTULA Left 07/07/2016   Procedure: CREATION OF LEFT ARM BRACHIOCEPHALIC FISTULA;  Surgeon: Waynetta Sandy, MD;  Location: [facility];  Service: Vascular;  Laterality: Left;     Short Social History:  Social History  Substance Use Topics  . Smoking status: Current Every Day Smoker    Packs/day: 0.50    Years: 40.00    Types: Cigarettes    Start date: 05/12/1974  . Smokeless tobacco: Never Used     Comment: 1/2 pk per day  . Alcohol use No     Comment: occ    Allergies  Allergen Reactions  . Other Other (See Comments)    IV contrast- Renal issues    Current Outpatient Prescriptions  Medication Sig Dispense Refill  . acetaminophen (TYLENOL) 500 MG tablet Take 1,000 mg by mouth every 6 (six) hours as needed for mild pain or moderate pain.    Marland Kitchen albuterol (PROVENTIL HFA;VENTOLIN HFA) 108 (90 Base) MCG/ACT inhaler Inhale 2 puffs into the lungs every 6 (six) hours as needed for wheezing or shortness of breath.    Marland Kitchen amLODipine (NORVASC) 5 MG tablet Take 10 mg by mouth daily.     Marland Kitchen aspirin EC 81 MG tablet Take 1 tablet (81 mg total) by mouth daily. 30 tablet 0  . furosemide (LASIX) 20 MG tablet Take 40 mg by mouth every evening.    . metoprolol (LOPRESSOR) 50 MG tablet Take 25 mg by mouth at bedtime.     . sevelamer carbonate (RENVELA) 2.4 g PACK Take 2.4 g by mouth 2 (two) times daily with a meal. Sometimes 3 times a day with large meals  0  . zolpidem (AMBIEN) 10 MG tablet Take  5 mg by mouth at bedtime.   0   No current facility-administered medications for this visit.     Review of Systems  Constitutional:  Constitutional negative. HENT: HENT negative.  Eyes: Eyes negative.  Cardiovascular: Cardiovascular negative.  GI: Gastrointestinal negative.  Musculoskeletal: Musculoskeletal negative.  Skin: Skin negative.  Neurological: Neurological negative. Hematologic: Positive for bruises/bleeds easily.  Psychiatric: Psychiatric negative.        Objective:  Objective   Vitals:   10/24/16 0923  BP: 136/77  Pulse: (!) 51  Resp: 20  Temp: 97.1 F (36.2 C)  TempSrc: Oral  SpO2: 100%  Weight: 153 lb 9.6 oz (69.7 kg)  Height: 6\' 2"  (1.88 m)    Body mass index is 19.72 kg/m.  Physical Exam  Constitutional: He is oriented to person, place, and time. He appears well-developed.  HENT:  Head: Normocephalic.  Eyes: Pupils are equal, round, and reactive to light.  Neck: Normal range of motion.  Cardiovascular: Normal rate.   Pulses:      Radial pulses are 2+ on the right side.  Easily palpable left upper arm avf  Pulmonary/Chest: Effort normal.  Abdominal: Soft.  Musculoskeletal: He exhibits no edema.  Neurological: He is alert and oriented to person, place, and time.  Skin: Skin is warm and dry.  Psychiatric: He has a normal mood and affect. His behavior is normal. Judgment and thought content normal.    Data:      Assessment/Plan:     56 year old male with recent history conversion left radiocephalic to brachiocephalic AV fistula. Currently dialyzing via right IJ catheter. He has had some infiltration of evidence of his new fistula left upper arm. I've encouraged him to continue to dialyze via his catheter and we will perform fistulogram in the near future when his hematoma resolves. We discussed risk and benefits and he agrees to proceed.     Waynetta Sandy MD [facility]

## 2016-11-05 ENCOUNTER — Encounter (HOSPITAL_COMMUNITY)
Admission: RE | Disposition: A | Discharge status: Home / Self Care | Payer: Self-pay | Source: Ambulatory Visit | Attending: Vascular Surgery

## 2016-11-05 ENCOUNTER — Encounter (HOSPITAL_COMMUNITY): Payer: Self-pay | Admitting: Vascular Surgery

## 2016-11-05 ENCOUNTER — Ambulatory Visit (HOSPITAL_COMMUNITY)
Admission: RE | Admit: 2016-11-05 | Discharge: 2016-11-05 | Disposition: A | Discharge status: Home / Self Care | Payer: BLUE CROSS/BLUE SHIELD | Source: Ambulatory Visit | Attending: Vascular Surgery | Admitting: Vascular Surgery

## 2016-11-05 DIAGNOSIS — Z86711 Personal history of pulmonary embolism: Secondary | ICD-10-CM | POA: Diagnosis not present

## 2016-11-05 DIAGNOSIS — I120 Hypertensive chronic kidney disease with stage 5 chronic kidney disease or end stage renal disease: Secondary | ICD-10-CM | POA: Diagnosis not present

## 2016-11-05 DIAGNOSIS — F17210 Nicotine dependence, cigarettes, uncomplicated: Secondary | ICD-10-CM | POA: Insufficient documentation

## 2016-11-05 DIAGNOSIS — Z7982 Long term (current) use of aspirin: Secondary | ICD-10-CM | POA: Diagnosis not present

## 2016-11-05 DIAGNOSIS — G710 Muscular dystrophy: Secondary | ICD-10-CM | POA: Insufficient documentation

## 2016-11-05 DIAGNOSIS — N186 End stage renal disease: Secondary | ICD-10-CM | POA: Diagnosis not present

## 2016-11-05 DIAGNOSIS — D631 Anemia in chronic kidney disease: Secondary | ICD-10-CM | POA: Diagnosis not present

## 2016-11-05 DIAGNOSIS — Z4901 Encounter for fitting and adjustment of extracorporeal dialysis catheter: Secondary | ICD-10-CM | POA: Insufficient documentation

## 2016-11-05 DIAGNOSIS — N185 Chronic kidney disease, stage 5: Secondary | ICD-10-CM | POA: Diagnosis not present

## 2016-11-05 HISTORY — PX: A/V FISTULAGRAM: CATH118298

## 2016-11-05 LAB — POCT I-STAT, CHEM 8
BUN: 25 mg/dL — AB (ref 6–20)
CALCIUM ION: 0.92 mmol/L — AB (ref 1.15–1.40)
CHLORIDE: 104 mmol/L (ref 101–111)
Creatinine, Ser: 6.4 mg/dL — ABNORMAL HIGH (ref 0.61–1.24)
GLUCOSE: 71 mg/dL (ref 65–99)
HCT: 38 % — ABNORMAL LOW (ref 39.0–52.0)
Hemoglobin: 12.9 g/dL — ABNORMAL LOW (ref 13.0–17.0)
Potassium: 4.9 mmol/L (ref 3.5–5.1)
Sodium: 137 mmol/L (ref 135–145)
TCO2: 25 mmol/L (ref 0–100)

## 2016-11-05 SURGERY — A/V FISTULAGRAM
Anesthesia: LOCAL | Laterality: Left

## 2016-11-05 MED ORDER — SODIUM CHLORIDE 0.9% FLUSH: 3.0000 mL | INTRAVENOUS | Status: DC | PRN

## 2016-11-05 MED ORDER — LIDOCAINE HCL (PF) 1 % IJ SOLN
INTRAMUSCULAR | Status: DC | PRN
  Administered 2016-11-05: 11:00:00

## 2016-11-05 MED ORDER — SODIUM CHLORIDE 0.9 % IV SOLN: 250.0000 mL | INTRAVENOUS | Status: DC | PRN

## 2016-11-05 MED ORDER — FENTANYL CITRATE (PF) 100 MCG/2ML IJ SOLN
INTRAMUSCULAR | Status: AC
  Filled 2016-11-05: qty 2

## 2016-11-05 MED ORDER — IODIXANOL 320 MG/ML IV SOLN
INTRAVENOUS | Status: DC | PRN
  Administered 2016-11-05: 20 mL via INTRAVENOUS

## 2016-11-05 MED ORDER — FENTANYL CITRATE (PF) 100 MCG/2ML IJ SOLN
INTRAMUSCULAR | Status: DC | PRN
  Administered 2016-11-05: 50 ug via INTRAVENOUS

## 2016-11-05 MED ORDER — MIDAZOLAM HCL 2 MG/2ML IJ SOLN
INTRAMUSCULAR | Status: AC
  Filled 2016-11-05: qty 2

## 2016-11-05 MED ORDER — METHYLPREDNISOLONE SODIUM SUCC 125 MG IJ SOLR
INTRAMUSCULAR | Status: AC
  Administered 2016-11-05: 125 mg via INTRAVENOUS
  Filled 2016-11-05: qty 2

## 2016-11-05 MED ORDER — FAMOTIDINE IN NACL 20-0.9 MG/50ML-% IV SOLN
INTRAVENOUS | Status: AC
  Administered 2016-11-05: 20 mg via INTRAVENOUS
  Filled 2016-11-05: qty 50

## 2016-11-05 MED ORDER — FAMOTIDINE IN NACL 20-0.9 MG/50ML-% IV SOLN
20.0000 mg | INTRAVENOUS | Status: AC
Start: 2016-11-05 — End: 2016-11-05
  Administered 2016-11-05: 20 mg via INTRAVENOUS

## 2016-11-05 MED ORDER — METHYLPREDNISOLONE SODIUM SUCC 125 MG IJ SOLR
125.0000 mg | INTRAMUSCULAR | Status: AC
  Administered 2016-11-05: 125 mg via INTRAVENOUS

## 2016-11-05 MED ORDER — SODIUM CHLORIDE 0.9% FLUSH
3.0000 mL | Freq: Two times a day (BID) | INTRAVENOUS | Status: DC
Start: 2016-11-05 — End: 2016-11-05

## 2016-11-05 MED ORDER — MIDAZOLAM HCL 2 MG/2ML IJ SOLN
INTRAMUSCULAR | Status: DC | PRN
  Administered 2016-11-05: 0.5 mg via INTRAVENOUS

## 2016-11-05 MED ORDER — HEPARIN (PORCINE) IN NACL 2-0.9 UNIT/ML-% IJ SOLN
INTRAMUSCULAR | Status: AC
  Filled 2016-11-05: qty 500

## 2016-11-05 MED ORDER — DIPHENHYDRAMINE HCL 50 MG/ML IJ SOLN
25.0000 mg | INTRAMUSCULAR | Status: AC
  Administered 2016-11-05: 25 mg via INTRAVENOUS

## 2016-11-05 MED ORDER — DIPHENHYDRAMINE HCL 50 MG/ML IJ SOLN
INTRAMUSCULAR | Status: AC
  Administered 2016-11-05: 25 mg via INTRAVENOUS
  Filled 2016-11-05: qty 1

## 2016-11-05 MED ORDER — LIDOCAINE HCL (PF) 1 % IJ SOLN
INTRAMUSCULAR | Status: AC
  Filled 2016-11-05: qty 30

## 2016-11-05 MED ORDER — LIDOCAINE HCL (PF) 1 % IJ SOLN
INTRAMUSCULAR | Status: DC | PRN
  Administered 2016-11-05: 2 mL via SUBCUTANEOUS

## 2016-11-05 SURGICAL SUPPLY — 10 items
BAG SNAP BAND KOVER 36X36 (MISCELLANEOUS) ×2 IMPLANT
COVER DOME SNAP 22 D (MISCELLANEOUS) ×2 IMPLANT
COVER PRB 48X5XTLSCP FOLD TPE (BAG) ×1 IMPLANT
COVER PROBE 5X48 (BAG) ×2
KIT MICROINTRODUCER STIFF 5F (SHEATH) ×2 IMPLANT
PROTECTION STATION PRESSURIZED (MISCELLANEOUS) ×2
STATION PROTECTION PRESSURIZED (MISCELLANEOUS) ×1 IMPLANT
STOPCOCK MORSE 400PSI 3WAY (MISCELLANEOUS) ×2 IMPLANT
TRAY PV CATH (CUSTOM PROCEDURE TRAY) ×2 IMPLANT
TUBING CIL FLEX 10 FLL-RA (TUBING) ×2 IMPLANT

## 2016-11-05 NOTE — Discharge Instructions (Signed)

## 2016-11-05 NOTE — H&P (Signed)
   History and Physical Update  The patient was interviewed and re-examined.  The patient's previous History and Physical has been reviewed and is unchanged from recent office visit. Plan for left arm fistulogram with possible intervention.  Mohab Ashby C. Donzetta Matters, MD [facility] [place] Office: [phone] Pager: [phone]   11/05/2016, 8:48 AM

## 2016-11-05 NOTE — Op Note (Signed)
    Patient name: Steven Sellers MRN: [id number] DOB: [birth year] Sex: male  11/05/2016 Pre-operative Diagnosis: esrd Post-operative diagnosis:  Same Surgeon:  Erlene Quan C. Donzetta Matters, MD Procedure Performed: 1.  US guided cannulation of left arm av fistula 2.  Left upper extremity fistulogram 3.  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 He is now indicated for fistulogram possibly mention.  Findings: The fistula and central veins are patent throughout her course. Retrograde angiogram demonstrates flow to the arterial system although this wasn't completely evaluated. There was no intervention undertaken.   Procedure:  The patient was identified in the holding area and taken to room 8.  The patient was then placed supine on the table and prepped and draped in the usual sterile fashion.  A time out was called.  Ultrasound was used to evaluate the left arm AV fistula. This was then cannulated with micropuncture needle and of my closure sheath was placed. Fish angiogram up extremity as well as central venogram was performed with the above findings. No intervention was undertaken. A figure-of-eight suture was placed and the sheath removed. Patient tolerated procedure well without immediate complication.   Contrast: 20cc  Satvik Parco C. Donzetta Matters, MD [facility] [place] Office: [phone] Pager: [phone]

## 2016-12-01 ENCOUNTER — Other Ambulatory Visit (HOSPITAL_COMMUNITY): Payer: Self-pay | Admitting: Nephrology

## 2016-12-01 DIAGNOSIS — N186 End stage renal disease: Secondary | ICD-10-CM

## 2016-12-03 ENCOUNTER — Ambulatory Visit (HOSPITAL_COMMUNITY)
Admission: RE | Admit: 2016-12-03 | Discharge: 2016-12-03 | Disposition: A | Discharge status: Home / Self Care | Payer: BLUE CROSS/BLUE SHIELD | Source: Ambulatory Visit | Attending: Nephrology | Admitting: Nephrology

## 2016-12-09 ENCOUNTER — Other Ambulatory Visit: Payer: Self-pay | Admitting: Physician Assistant

## 2016-12-10 ENCOUNTER — Ambulatory Visit (HOSPITAL_COMMUNITY)
Admission: RE | Admit: 2016-12-10 | Discharge: 2016-12-10 | Disposition: A | Discharge status: Home / Self Care | Payer: BLUE CROSS/BLUE SHIELD | Source: Ambulatory Visit | Attending: Interventional Radiology | Admitting: Interventional Radiology

## 2016-12-10 ENCOUNTER — Encounter (HOSPITAL_COMMUNITY): Payer: Self-pay | Admitting: Physician Assistant

## 2016-12-10 DIAGNOSIS — N186 End stage renal disease: Secondary | ICD-10-CM | POA: Insufficient documentation

## 2016-12-10 DIAGNOSIS — Z4901 Encounter for fitting and adjustment of extracorporeal dialysis catheter: Secondary | ICD-10-CM | POA: Diagnosis not present

## 2016-12-10 HISTORY — PX: IR REMOVAL TUN CV CATH W/O FL: IMG2289

## 2016-12-10 MED ORDER — LIDOCAINE-EPINEPHRINE (PF) 1 %-1:200000 IJ SOLN
INTRAMUSCULAR | Status: DC | PRN
  Administered 2016-12-10: 10 mL

## 2016-12-10 MED ORDER — LIDOCAINE-EPINEPHRINE (PF) 1 %-1:200000 IJ SOLN
30.0000 mL | Freq: Once | INTRAMUSCULAR | Status: DC
  Filled 2016-12-10: qty 30

## 2016-12-10 MED ORDER — CHLORHEXIDINE GLUCONATE 4 % EX LIQD
CUTANEOUS | Status: AC
  Filled 2016-12-10: qty 15

## 2017-02-26 ENCOUNTER — Emergency Department (HOSPITAL_COMMUNITY): Payer: BLUE CROSS/BLUE SHIELD

## 2017-02-26 ENCOUNTER — Encounter (HOSPITAL_COMMUNITY): Payer: Self-pay | Admitting: Emergency Medicine

## 2017-02-26 ENCOUNTER — Observation Stay (HOSPITAL_COMMUNITY)
Admission: EM | Admit: 2017-02-26 | Discharge: 2017-02-28 | Disposition: A | Discharge status: Home / Self Care | Payer: BLUE CROSS/BLUE SHIELD | Attending: Internal Medicine | Admitting: Internal Medicine

## 2017-02-26 DIAGNOSIS — D631 Anemia in chronic kidney disease: Secondary | ICD-10-CM | POA: Diagnosis not present

## 2017-02-26 DIAGNOSIS — I951 Orthostatic hypotension: Secondary | ICD-10-CM | POA: Diagnosis not present

## 2017-02-26 DIAGNOSIS — Z79899 Other long term (current) drug therapy: Secondary | ICD-10-CM | POA: Insufficient documentation

## 2017-02-26 DIAGNOSIS — I4891 Unspecified atrial fibrillation: Principal | ICD-10-CM | POA: Diagnosis present

## 2017-02-26 DIAGNOSIS — Z992 Dependence on renal dialysis: Secondary | ICD-10-CM | POA: Insufficient documentation

## 2017-02-26 DIAGNOSIS — J181 Lobar pneumonia, unspecified organism: Secondary | ICD-10-CM | POA: Diagnosis not present

## 2017-02-26 DIAGNOSIS — J189 Pneumonia, unspecified organism: Secondary | ICD-10-CM

## 2017-02-26 DIAGNOSIS — N186 End stage renal disease: Secondary | ICD-10-CM | POA: Insufficient documentation

## 2017-02-26 DIAGNOSIS — Z7982 Long term (current) use of aspirin: Secondary | ICD-10-CM | POA: Insufficient documentation

## 2017-02-26 DIAGNOSIS — R7989 Other specified abnormal findings of blood chemistry: Secondary | ICD-10-CM | POA: Diagnosis not present

## 2017-02-26 DIAGNOSIS — Z86711 Personal history of pulmonary embolism: Secondary | ICD-10-CM | POA: Diagnosis not present

## 2017-02-26 DIAGNOSIS — G7100 Muscular dystrophy, unspecified: Secondary | ICD-10-CM | POA: Diagnosis not present

## 2017-02-26 DIAGNOSIS — F17210 Nicotine dependence, cigarettes, uncomplicated: Secondary | ICD-10-CM | POA: Insufficient documentation

## 2017-02-26 DIAGNOSIS — I132 Hypertensive heart and chronic kidney disease with heart failure and with stage 5 chronic kidney disease, or end stage renal disease: Secondary | ICD-10-CM | POA: Diagnosis not present

## 2017-02-26 DIAGNOSIS — I509 Heart failure, unspecified: Secondary | ICD-10-CM | POA: Diagnosis not present

## 2017-02-26 DIAGNOSIS — R55 Syncope and collapse: Secondary | ICD-10-CM | POA: Diagnosis present

## 2017-02-26 HISTORY — DX: Unspecified convulsions: R56.9

## 2017-02-26 LAB — CBC WITH DIFFERENTIAL/PLATELET
BASOS PCT: 0 %
Basophils Absolute: 0 10*3/uL (ref 0.0–0.1)
Eosinophils Absolute: 0 10*3/uL (ref 0.0–0.7)
Eosinophils Relative: 0 %
HCT: 41.4 % (ref 39.0–52.0)
HEMOGLOBIN: 13.5 g/dL (ref 13.0–17.0)
LYMPHS ABS: 0.8 10*3/uL (ref 0.7–4.0)
LYMPHS PCT: 6 %
MCH: 31.3 pg (ref 26.0–34.0)
MCHC: 32.6 g/dL (ref 30.0–36.0)
MCV: 95.8 fL (ref 78.0–100.0)
MONO ABS: 1.4 10*3/uL — AB (ref 0.1–1.0)
MONOS PCT: 11 %
NEUTROS ABS: 11 10*3/uL — AB (ref 1.7–7.7)
NEUTROS PCT: 83 %
Platelets: 163 10*3/uL (ref 150–400)
RBC: 4.32 MIL/uL (ref 4.22–5.81)
RDW: 13.7 % (ref 11.5–15.5)
WBC: 13.2 10*3/uL — ABNORMAL HIGH (ref 4.0–10.5)

## 2017-02-26 LAB — COMPREHENSIVE METABOLIC PANEL
ALBUMIN: 3.3 g/dL — AB (ref 3.5–5.0)
ALK PHOS: 79 U/L (ref 38–126)
ALT: 11 U/L — ABNORMAL LOW (ref 17–63)
ANION GAP: 11 (ref 5–15)
AST: 13 U/L — ABNORMAL LOW (ref 15–41)
BUN: 14 mg/dL (ref 6–20)
CO2: 25 mmol/L (ref 22–32)
CREATININE: 4.27 mg/dL — AB (ref 0.61–1.24)
Calcium: 8.7 mg/dL — ABNORMAL LOW (ref 8.9–10.3)
Chloride: 98 mmol/L — ABNORMAL LOW (ref 101–111)
GFR calc Af Amer: 16 mL/min — ABNORMAL LOW (ref >60)
GFR, EST NON AFRICAN AMERICAN: 14 mL/min — AB (ref >60)
GLUCOSE: 118 mg/dL — AB (ref 65–99)
Potassium: 4.1 mmol/L (ref 3.5–5.1)
Sodium: 134 mmol/L — ABNORMAL LOW (ref 135–145)
Total Bilirubin: 1.2 mg/dL (ref 0.3–1.2)
Total Protein: 7.2 g/dL (ref 6.5–8.1)

## 2017-02-26 LAB — CBC
HCT: 39.1 % (ref 39.0–52.0)
HEMOGLOBIN: 12.6 g/dL — AB (ref 13.0–17.0)
MCH: 31.1 pg (ref 26.0–34.0)
MCHC: 32.2 g/dL (ref 30.0–36.0)
MCV: 96.5 fL (ref 78.0–100.0)
Platelets: 140 10*3/uL — ABNORMAL LOW (ref 150–400)
RBC: 4.05 MIL/uL — AB (ref 4.22–5.81)
RDW: 13.8 % (ref 11.5–15.5)
WBC: 13.9 10*3/uL — AB (ref 4.0–10.5)

## 2017-02-26 LAB — APTT: APTT: 57 s — AB (ref 24–36)

## 2017-02-26 LAB — PROTIME-INR
INR: 1.12
PROTHROMBIN TIME: 14.3 s (ref 11.4–15.2)

## 2017-02-26 LAB — TROPONIN I
TROPONIN I: 0.05 ng/mL — AB (ref <0.03)
TROPONIN I: 0.06 ng/mL — AB (ref <0.03)

## 2017-02-26 LAB — MAGNESIUM: Magnesium: 2 mg/dL (ref 1.7–2.4)

## 2017-02-26 LAB — TSH: TSH: 0.347 u[IU]/mL — AB (ref 0.350–4.500)

## 2017-02-26 MED ORDER — HEPARIN (PORCINE) IN NACL 100-0.45 UNIT/ML-% IJ SOLN
1400.0000 [IU]/h | INTRAMUSCULAR | Status: DC
  Administered 2017-02-26: 1100 [IU]/h via INTRAVENOUS
  Filled 2017-02-26 (×2): qty 250

## 2017-02-26 MED ORDER — ONDANSETRON HCL 4 MG PO TABS: 4.0000 mg | ORAL_TABLET | Freq: Four times a day (QID) | ORAL | Status: DC | PRN

## 2017-02-26 MED ORDER — SODIUM CHLORIDE 0.9 % IV SOLN: INTRAVENOUS | Status: DC

## 2017-02-26 MED ORDER — SODIUM CHLORIDE 0.9 % IV BOLUS (SEPSIS)
500.0000 mL | Freq: Once | INTRAVENOUS | Status: AC
  Administered 2017-02-26: 500 mL via INTRAVENOUS

## 2017-02-26 MED ORDER — DILTIAZEM HCL-DEXTROSE 100-5 MG/100ML-% IV SOLN (PREMIX)
5.0000 mg/h | Freq: Once | INTRAVENOUS | Status: AC
Start: 2017-02-26 — End: 2017-02-27
  Administered 2017-02-26: 5 mg/h via INTRAVENOUS
  Filled 2017-02-26: qty 100

## 2017-02-26 MED ORDER — DILTIAZEM HCL 25 MG/5ML IV SOLN
15.0000 mg | Freq: Once | INTRAVENOUS | Status: AC
  Administered 2017-02-26: 15 mg via INTRAVENOUS

## 2017-02-26 MED ORDER — DEXTROSE 5 % IV SOLN
500.0000 mg | Freq: Once | INTRAVENOUS | Status: AC
  Administered 2017-02-26: 500 mg via INTRAVENOUS
  Filled 2017-02-26: qty 500

## 2017-02-26 MED ORDER — HEPARIN BOLUS VIA INFUSION
4000.0000 [IU] | Freq: Once | INTRAVENOUS | Status: AC
  Administered 2017-02-26: 4000 [IU] via INTRAVENOUS

## 2017-02-26 MED ORDER — SEVELAMER CARBONATE 2.4 G PO PACK
3.2000 g | PACK | Freq: Three times a day (TID) | ORAL | Status: DC
  Administered 2017-02-28 (×2): 3.2 g via ORAL
  Filled 2017-02-26 (×13): qty 1

## 2017-02-26 MED ORDER — DEXTROSE 5 % IV SOLN
1.0000 g | INTRAVENOUS | Status: DC
  Administered 2017-02-27: 1 g via INTRAVENOUS
  Filled 2017-02-26 (×3): qty 10

## 2017-02-26 MED ORDER — AZITHROMYCIN 250 MG PO TABS
500.0000 mg | ORAL_TABLET | ORAL | Status: DC
  Administered 2017-02-27: 500 mg via ORAL
  Filled 2017-02-26: qty 2

## 2017-02-26 MED ORDER — WARFARIN - PHARMACIST DOSING INPATIENT
Status: DC
  Administered 2017-02-27: 16:00:00

## 2017-02-26 MED ORDER — DEXTROSE 5 % IV SOLN
1.0000 g | Freq: Once | INTRAVENOUS | Status: AC
  Administered 2017-02-26: 1 g via INTRAVENOUS
  Filled 2017-02-26: qty 10

## 2017-02-26 MED ORDER — WARFARIN SODIUM 5 MG PO TABS
5.0000 mg | ORAL_TABLET | Freq: Once | ORAL | Status: AC
  Administered 2017-02-26: 5 mg via ORAL
  Filled 2017-02-26: qty 1

## 2017-02-26 MED ORDER — ADENOSINE 6 MG/2ML IV SOLN
INTRAVENOUS | Status: AC
  Administered 2017-02-26: 6 mg via INTRAVENOUS
  Filled 2017-02-26: qty 2

## 2017-02-26 MED ORDER — ZOLPIDEM TARTRATE 5 MG PO TABS
5.0000 mg | ORAL_TABLET | Freq: Every day | ORAL | Status: DC
  Administered 2017-02-26 – 2017-02-27 (×2): 5 mg via ORAL
  Filled 2017-02-26 (×2): qty 1

## 2017-02-26 MED ORDER — COUMADIN BOOK
Freq: Once | Status: DC
  Filled 2017-02-26: qty 1

## 2017-02-26 MED ORDER — ACETAMINOPHEN 325 MG PO TABS: 650.0000 mg | ORAL_TABLET | Freq: Four times a day (QID) | ORAL | Status: DC | PRN

## 2017-02-26 MED ORDER — ACETAMINOPHEN 650 MG RE SUPP: 650.0000 mg | Freq: Four times a day (QID) | RECTAL | Status: DC | PRN

## 2017-02-26 MED ORDER — CEFAZOLIN SODIUM-DEXTROSE 2-4 GM/100ML-% IV SOLN
2.0000 g | Freq: Once | INTRAVENOUS | Status: DC
  Filled 2017-02-26: qty 100

## 2017-02-26 MED ORDER — WARFARIN VIDEO: Freq: Once | Status: DC

## 2017-02-26 MED ORDER — ADENOSINE 6 MG/2ML IV SOLN
6.0000 mg | Freq: Once | INTRAVENOUS | Status: AC
  Administered 2017-02-26: 6 mg via INTRAVENOUS

## 2017-02-26 MED ORDER — ONDANSETRON HCL 4 MG/2ML IJ SOLN: 4.0000 mg | Freq: Four times a day (QID) | INTRAMUSCULAR | Status: DC | PRN

## 2017-02-26 MED ORDER — SODIUM CHLORIDE 0.9% FLUSH
3.0000 mL | Freq: Two times a day (BID) | INTRAVENOUS | Status: DC
  Administered 2017-02-26 – 2017-02-28 (×4): 3 mL via INTRAVENOUS

## 2017-02-26 NOTE — H&P (Signed)
History and Physical    Steven Sellers:[id number] DOB: November 21, 1960 DOA: 02/26/2017  Referring MD/NP/PA: Merrily Pew PCP: Sharilyn Sites, MD   Patient coming from: home  Chief Complaint: lightheadedness  HPI: Steven Sellers is a 56 y.o. male with history of muscular dystrophy, anemia, hypertension, end-stage renal disease, pulmonary embolism no longer on anticoagulation who presented to the emergency department today secondary to syncopal episodes. For the last week, he has had some cough and increased chills but has otherwise been feeling well. At dialysis, they have been trying to reduce his dry weight and they took off a little extra fluid today. Immediately after dialysis he felt fine and was able to drive himself home. When he got home, he felt lightheaded every time he sat up or stood up and almost passed out once but was able to recover by sitting down. He drank some water and felt better but his wife encouraged him to come to the emergency department.    ED Course: Vital signs afebrile, pulse labile up to 155 which initially appeared to be SVT.  The emergency department physician did carotid massage and his heart rate dropped down to the 90s but it still appeared to be atrial fibrillation and irregular. His heart rate immediately jumped back up to the 140s/150s and they attempted to give him adenosine which do not work.   He was started on a diltiazem drip at 5 mg/h and his heart rate dropped to the 90s and he spontaneously converted back to sinus rhythm.  During this time, he did not feel any chest pains, chest tightness, shortness of breath, palpitations, or increased lightheadedness. He was mildly hypertensive when he was at his most tachycardic.  He is breathing comfortably on room air.  Labs:  White blood cell count 13.2, BMP consistent with end-stage renal disease. His troponin was 0.05, minimally elevated as expected for someone on dialysis. Chest x-ray demonstrated a mass like  opacity in the right upper lobe. A CT chest without contrast was performed which confirmed a mass like opacity in the right upper lobe which appeared to be pneumonia but malignancy was not excluded.  He had what appeared to be reactive lymphadenopathy.  He was started on ceftriaxone and azithromycin for community-acquired pneumonia and is being admitted for further management.  Review of Systems:  General:  Denies fevers, positivechills, weight loss, unintentional HEENT:  Denies changes to hearing and vision, sinus congestion, sore throat CV:  Denies chest pain, palpitations, lower extremity edema.  PULM:  Denies SOB, positive cough.   GI:  Denies nausea, vomiting, constipation, diarrhea.   GU:  Denies dysuria, frequency, urgency ENDO:  Denies polyuria, polydipsia.   HEME:  Denies blood in stools, abnormal bruising or bleeding.  LYMPH:  Denies lymphadenopathy.   MSK:  Denies arthralgias, myalgias.   DERM:  Denies skin rash or ulcer.   NEURO:  Denies new focal numbness or weakness, slurred speech, confusion PSYCH:  Denies anxiety and depression.    Past Medical History:  Diagnosis Date  . Alcohol use   . Anemia   . ESRD (end stage renal disease) ([place])    Hemodialysis [facility]- [facility] in [place]  . Hypertension   . Muscular dystrophy    Limb Girdle  . Pulmonary embolism ([place]) 2011  . Tobacco abuse   . Vision abnormalities     Past Surgical History:  Procedure Laterality Date  . A/V FISTULAGRAM Left 11/05/2016   Procedure: A/V Fistulagram;  Surgeon: Waynetta Sandy, MD;  Location: [facility];  Service: Cardiovascular;  Laterality: Left;  . AV FISTULA PLACEMENT Left 12/26/2015   Procedure: CREATION OF LEFT RADIAL-CEPHALIC ARTERIOVENOUS FISTULA FOR HEMODIALYSIS;  Surgeon: Vickie Epley, MD;  Location: [facility];  Service: Vascular;  Laterality: Left;  . AV FISTULA PLACEMENT Left 04/28/2016   Procedure: REVISION OF LEFT RADIAL CEPHALIC ARTERIOVENOUS (AV) FISTULA  FOR  DURABLE HEMODIALYSIS;  Surgeon: Vickie Epley, MD;  Location: [facility];  Service: Vascular;  Laterality: Left;  . CARDIAC SURGERY    . CENTRAL VENOUS CATHETER INSERTION Right 12/04/2015   Procedure: INSERTION OF TUNNELED HEMODIALYSIS CATHETER RIGHT INTERNAL JUGULAR;  Surgeon: Vickie Epley, MD;  Location: [facility];  Service: General;  Laterality: Right;  . IR REMOVAL TUN CV CATH W/O FL  12/10/2016  . REVISON OF ARTERIOVENOUS FISTULA Left 07/07/2016   Procedure: CREATION OF LEFT ARM BRACHIOCEPHALIC FISTULA;  Surgeon: Waynetta Sandy, MD;  Location: [facility];  Service: Vascular;  Laterality: Left;     reports that he has been smoking Cigarettes.  He started smoking about 42 years ago. He has a 20.00 pack-year smoking history. He has never used smokeless tobacco. He reports that he does not drink alcohol or use drugs.  Allergies  Allergen Reactions  . Other Other (See Comments)    IV contrast- Renal issues    Family History  Problem Relation Age of Onset  . Hypertension Mother   . Heart attack Father        d/o MI at 64 yo   . Cancer Neg Hx   . Kidney disease Neg Hx     Prior to Admission medications   Medication Sig Start Date End Date Taking? Authorizing Provider  acetaminophen (TYLENOL) 500 MG tablet Take 1,000 mg by mouth every 6 (six) hours as needed for mild pain or moderate pain.   Yes [provider]  amLODipine (NORVASC) 10 MG tablet Take 10 mg by mouth daily.   Yes [provider]  furosemide (LASIX) 20 MG tablet Take 40 mg by mouth at bedtime.    Yes [provider]  lidocaine-prilocaine (EMLA) cream Apply 1 application topically once.  01/06/17  Yes [provider]  sevelamer carbonate (RENVELA) 0.8 g PACK packet Take 3.2 g by mouth 3 (three) times daily with meals.  01/05/17  Yes [provider]  zolpidem (AMBIEN) 5 MG tablet Take 5 mg by mouth at bedtime.   Yes [provider]  aspirin EC 81 MG tablet Take 1 tablet  (81 mg total) by mouth daily. Patient not taking: Reported on 10/31/2016 09/27/16   Eber Jones, MD    Physical Exam: Vitals:   02/26/17 1400 02/26/17 1430 02/26/17 1500 02/26/17 1530  BP: 114/82 106/78 94/74 110/75  Pulse: 82 87 (!) 151 90  Resp: (!) 24 18 (!) 22 20  Temp:      TempSrc:      SpO2: 98% 96% 98% 98%  Weight:      Height:        Constitutional:  Thin male, mild temporal wasting.  NAD, calm, comfortable Eyes: PERRL, lids and conjunctivae normal ENMT:  Moist mucous membranes.  Oropharynx nonerythematous, no exudates.   Neck:  No nuchal rigidity, no masses Respiratory:  Diminished bilateral breath sounds, more diminished on the right than the left, no focal rales, no wheezes Cardiovascular: Regular rate and rhythm, 2/6 systolic murmur heard at the right upper sternal border and at the left sternal border, possibly radiating  from his fistula in his left arm.  Fistula in left arm with positive bruit, positive thrill. 2+ radial pulses. Abdomen:  Normal active bowel sounds, soft, nondistended, nontender Musculoskeletal: Normal muscle tone and bulk.  No contractures.  Skin:  no rashes, abrasions, or ulcers Neurologic:  CN 2-12 grossly intact. Sensation intact to light touch, strength 5/5 throughout Psychiatric:  Alert and oriented x 3. Normal affect.   Labs on Admission: I have personally reviewed following labs and imaging studies  CBC:  Recent Labs Lab 02/26/17 1251  WBC 13.2*  NEUTROABS 11.0*  HGB 13.5  HCT 41.4  MCV 95.8  PLT 299   Basic Metabolic Panel:  Recent Labs Lab 02/26/17 1251  NA 134*  K 4.1  CL 98*  CO2 25  GLUCOSE 118*  BUN 14  CREATININE 4.27*  CALCIUM 8.7*  MG 2.0   GFR: Estimated Creatinine Clearance: 17.8 mL/min (A) (by C-G formula based on SCr of 4.27 mg/dL (H)). Liver Function Tests:  Recent Labs Lab 02/26/17 1251  AST 13*  ALT 11*  ALKPHOS 79  BILITOT 1.2  PROT 7.2  ALBUMIN 3.3*   No results for input(s):  LIPASE, AMYLASE in the last 168 hours. No results for input(s): AMMONIA in the last 168 hours. Coagulation Profile: No results for input(s): INR, PROTIME in the last 168 hours. Cardiac Enzymes:  Recent Labs Lab 02/26/17 1251  TROPONINI 0.05*   BNP (last 3 results) No results for input(s): PROBNP in the last 8760 hours. HbA1C: No results for input(s): HGBA1C in the last 72 hours. CBG: No results for input(s): GLUCAP in the last 168 hours. Lipid Profile: No results for input(s): CHOL, HDL, LDLCALC, TRIG, CHOLHDL, LDLDIRECT in the last 72 hours. Thyroid Function Tests: No results for input(s): TSH, T4TOTAL, FREET4, T3FREE, THYROIDAB in the last 72 hours. Anemia Panel: No results for input(s): VITAMINB12, FOLATE, FERRITIN, TIBC, IRON, RETICCTPCT in the last 72 hours. Urine analysis:    Component Value Date/Time   COLORURINE YELLOW 05/21/2016 [place] 05/21/2016 0941   LABSPEC 1.020 05/21/2016 0941   PHURINE 8.0 05/21/2016 0941   GLUCOSEU NEGATIVE 05/21/2016 0941   HGBUR LARGE (A) 05/21/2016 0941   BILIRUBINUR NEGATIVE 05/21/2016 0941   KETONESUR NEGATIVE 05/21/2016 0941   PROTEINUR >300 (A) 05/21/2016 0941   UROBILINOGEN 0.2 04/11/2010 2336   NITRITE NEGATIVE 05/21/2016 0941   LEUKOCYTESUR NEGATIVE 05/21/2016 0941   Sepsis Labs: @LABRCNTIP (procalcitonin:4,lacticidven:4) )No results found for this or any previous visit (from the past 240 hour(s)).   Radiological Exams on Admission: Dg Chest 2 View  Result Date: 02/26/2017 CLINICAL DATA:  Syncopal episodes today, head dialysis today, coughing EXAM: CHEST  2 VIEW COMPARISON:  Chest x-ray of 05/21/2016 FINDINGS: There is a masslike opacity within the posterior aspect of the right upper lobe. This probably represents pneumonia, with neoplasm less likely in view of the normal chest x-ray in January of 2018. However, follow-up chest x-ray after interval treatment is recommended to exclude underlying malignancy.  Otherwise the lungs are clear and somewhat hyperaerated. Mediastinal and hilar contours are unremarkable. The heart is within normal limits in size. No bony abnormality is seen. IMPRESSION: 1. Masslike opacity in the posterior inferior aspect of the right upper lobe. Probable pneumonia but recommend followup to exclude malignancy. 2. Hyperaeration consistent with emphysema . Electronically Signed   By: Ivar Drape M.D.   On: 02/26/2017 13:29   Ct Chest Wo Contrast  Result Date: 02/26/2017 CLINICAL DATA:  Lightheadedness after dialysis. Abnormal  chest x-ray. End-stage renal disease. EXAM: CT CHEST WITHOUT CONTRAST TECHNIQUE: Multidetector CT imaging of the chest was performed following the standard protocol without IV contrast. COMPARISON:  Chest CT 04/06/2010 FINDINGS: Cardiovascular: Normal heart size. No pericardial effusion. No notable atherosclerotic calcification. Mediastinum/Nodes: Mild right hilar adenopathy, nonspecific in this setting. Measurement of discrete nodes is limited due to lack of intravenous contrast and intra-abdominal fat. Lungs/Pleura: There is a dense opacity in the posterior segment right upper lobe with neighboring ground-glass density. Centrally no air bronchograms are seen. This is likely pneumonia in this patient with leukocytosis but need follow-up to exclude mass with superimposed consolidation. No retraction of the adjacent major fissure. Ground-glass opacity with volume loss in the right lower lobe posteriorly, atelectasis versus infection. Generalized airway thickening. Paraseptal and mild centrilobular emphysematous change. Upper Abdomen: Bilateral renal atrophy. Musculoskeletal: Prominent muscular atrophy. No acute or aggressive finding. 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 The lipoma measures up to 5.1 cm. Asymmetric breast tissue on the right with more lateral nodular density, stable since 2011.  IMPRESSION: 1. Masslike opacity in the right upper lobe that again favors pneumonia in this patient with leukocytosis. Followup PA and lateral chest X-ray is recommended in 3-4 weeks following trial of antibiotic therapy to ensure resolution and exclude underlying malignancy. 2. Mild right hilar adenopathy attributed to #1. 3. 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 4. Bronchitic airway thickening and mild emphysema. Electronically Signed   By: Monte Fantasia M.D.   On: 02/26/2017 14:44    EKG: Independently reviewed. Initially looks like A. Fib, but subsequent EKG appears to be SVTwith rate of 155 bpm.  Spontaneously converted to sinus rhythm during my visit as seen on telemetry.  Assessment/Plan Active Problems:   * No active hospital problems. *   Presyncope, likely related to underlying pneumonia, a-fib, and mild dehydration after HD.  Symptoms improved with IVF -  D/c lasix -  No further IVF, encourage PO  A-fib with RVR, spontaneously converted to NSR, likely triggered by dehydration and pneumonia -  Troponins -  TSH -  ECHO from May demonstrates EF 55-60% with grade 1 DD, mild MR, trivial TR.   -  Assuming repeat ECHO is stable, CHADs2vasc = 1 for HTN and he could do aspirin or warfarin.  If new onset heart failure, then would increase to 2 and would need a/c -  Will start heparin and warfarin pending repeat ECHO for now and discuss again with patient tomorrow -  He has been on warfarin before and bled easily -  Also, he has had problems with bradycardia on diltiazem and metoprolol so should definitely be followed by cardiology as he may be at risk for tachy-brady syndrome -  D/c diltiazem now that he is in NSR and blood pressures are soft  Minimally elevated troponin, level expected for ESRD, chest pain free -  See above  Hypertension, blood pressure low normal -  Hold norvasc and lasix  CAP probably of both right upper and  right lower lobe -  Ceftriaxone and azithromycin -  Hold off on steroids for now as on room air and will not change LOS -  Repeat CXR in 3-4 weeks  ESRD, had HD today and will likely be discharged before next session -  Nephrology NOT notified at this time   Muscular dystrophy, does not use cane or  walker  Hx of PE, on warfarin for 8 months, then stopped.  Incited by chest wall trauma.    DVT prophylaxis: heparin gtt  Code Status: full code Family Communication:  Patient alone Disposition Plan: likely to home tomorrow  Consults called: Cardiology was consulted by ER  Admission status: observation, stepdown, likely home tomorrow   Janece Canterbury MD [facility] Pager [phone]  If 7PM-7AM, please contact night-coverage [email or web address] Password [email or web address]  02/26/2017, 4:12 PM

## 2017-02-26 NOTE — Progress Notes (Signed)
ANTICOAGULATION CONSULT NOTE - Initial Consult  Pharmacy Consult for Heparin and Coumadin Indication: atrial fibrillation  Allergies  Allergen Reactions  . Other Other (See Comments)    IV contrast- Renal issues   Patient Measurements: Height: 6\' 2"  (188 cm) Weight: 145 lb (65.8 kg) IBW/kg (Calculated) : 82.2 HEPARIN DW (KG): 65.8  Vital Signs: Temp: 98.8 F (37.1 C) (10/18 1222) Temp Source: Oral (10/18 1222) BP: 110/75 (10/18 1530) Pulse Rate: 90 (10/18 1530)  Labs:  Recent Labs  02/26/17 1251  HGB 13.5  HCT 41.4  PLT 163  CREATININE 4.27*  TROPONINI 0.05*   Estimated Creatinine Clearance: 17.8 mL/min (A) (by C-G formula based on SCr of 4.27 mg/dL (H)).  Medical History: Past Medical History:  Diagnosis Date  . Alcohol use   . Anemia   . ESRD (end stage renal disease) ([place])    Hemodialysis [facility]- [facility] in [place]  . Hypertension   . Muscular dystrophy    Limb Girdle  . Pulmonary embolism ([place]) 2011  . Tobacco abuse   . Vision abnormalities    Medications:   (Not in a hospital admission)  Assessment: 56yo male with afib.  Pt has been doing dialysis x 2 years.  C/O light headed.  Asked to initiate Heparin.  Goal of Therapy:  Heparin level 0.3-0.7 units/ml Monitor platelets by anticoagulation protocol: Yes  INR 2-3   Plan:  Heparin 4000 units x 1 Heparin infusion at 1100 units/hr Coumadin 5mg  today x 1 Protime/INR, heparin level and CBC daily Provide education  Hart Robinsons A 02/26/2017,4:11 PM

## 2017-02-26 NOTE — ED Notes (Signed)
Pt's wife left and left her number [phone]

## 2017-02-26 NOTE — ED Provider Notes (Addendum)
Emergency Department Provider Note   I have reviewed the triage vital signs and the nursing notes.   HISTORY  Chief Complaint Near Syncope   HPI Steven Sellers is a 56 y.o. male with a past medical history of anemia, hypertension, muscle dystrophy, pulmonary embolus, as is renal disease on dialysis Tuesday Thursday Saturday who presents emergency department today secondary to near syncopal episodes.he states that they have been changing his dry weight pretty consistently over the last few months. Today they took off 2 kg and his departing weight was 64.6 kg. He remembers that initially his dry weight was 70 kg but once again they've been slowly decreasing. He did fine initially after he drove home without any difficulty but once he got home he had multiple episodes of significant lightheadedness feeling like he was given a pass out when standing. He had no associated vision changes, chest pain, back pain, abdominal pain or severe headache with this. No palpitations. His never really had symptoms of this before after dialysis but is never been in this local weight. He drank a couple glasses of saltwater and had a snack and subsequently his symptoms improved however his wife was concerned and brought him here for evaluation.No other recent medical changes. No other associated or modifying symptoms. Has not done anything else to try to help with the symptoms. Once again has not had this issue after dialysis before. No recent cough, fever, headache, palpitations or other symptoms.   Past Medical History:  Diagnosis Date  . Alcohol use   . Anemia   . ESRD (end stage renal disease) ([place])    Hemodialysis [facility]- [facility] in [place]  . Hypertension   . Muscular dystrophy    Limb Girdle  . Pulmonary embolism ([facility]) 2011  . Tobacco abuse   . Vision abnormalities     Patient Active Problem List   Diagnosis Date Noted  . First time seizure ([place])   . Left arm weakness 09/25/2016  . CHF  (congestive heart failure) ([place]) 12/16/2015  . CKD (chronic kidney disease) requiring chronic dialysis ([place]) 12/16/2015  . SOB (shortness of breath) 12/16/2015  . Central venous catheter in place   . Hyponatremia 11/30/2015  . Anemia 11/30/2015  . ESRD needing dialysis ([place]) 11/30/2015  . Acute renal failure (ARF) ([place]) 11/30/2015  . Hyperkalemia 05/12/2015  . CKD (chronic kidney disease) stage 3, GFR 30-59 ml/min ([facility]) 05/12/2015  . 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 Muscle atrophy 04/25/2015  . Arm weakness 04/25/2015  . Leg weakness 04/25/2015  . Dysesthesia 04/25/2015  . Essential hypertension 05/21/2010  . PULMONARY EMBOLISM 05/21/2010  . PLEURAL EFFUSION 05/21/2010  . RENAL FAILURE, ACUTE 05/21/2010    Past Surgical History:  Procedure Laterality Date  . A/V FISTULAGRAM Left 11/05/2016   Procedure: A/V Fistulagram;  Surgeon: Waynetta Sandy, MD;  Location: [facility];  Service: Cardiovascular;  Laterality: Left;  . AV FISTULA PLACEMENT Left 12/26/2015   Procedure: CREATION OF LEFT RADIAL-CEPHALIC ARTERIOVENOUS FISTULA FOR HEMODIALYSIS;  Surgeon: Vickie Epley, MD;  Location: [facility];  Service: Vascular;  Laterality: Left;  . AV FISTULA PLACEMENT Left 04/28/2016   Procedure: REVISION OF LEFT RADIAL CEPHALIC ARTERIOVENOUS (AV) FISTULA  FOR DURABLE HEMODIALYSIS;  Surgeon: Vickie Epley, MD;  Location: [facility];  Service: Vascular;  Laterality: Left;  . CARDIAC SURGERY    . CENTRAL VENOUS CATHETER INSERTION Right 12/04/2015   Procedure: INSERTION OF TUNNELED HEMODIALYSIS CATHETER RIGHT INTERNAL JUGULAR;  Surgeon: Arn Medal  Rosana Hoes, MD;  Location: [facility];  Service: General;  Laterality: Right;  . IR REMOVAL TUN CV CATH W/O FL  12/10/2016  . REVISON OF ARTERIOVENOUS FISTULA Left 07/07/2016   Procedure: CREATION OF LEFT ARM BRACHIOCEPHALIC FISTULA;  Surgeon: Waynetta Sandy, MD;  Location: [facility];  Service: Vascular;  Laterality: Left;    Current  Outpatient Rx  . Order #: [id number] Class: Historical Med  . Order #: [id number] Class: Historical Med  . Order #: [id number] Class: Historical Med  . Order #: [id number] Class: Historical Med  . Order #: [id number] Class: Historical Med  . Order #: [id number] Class: Historical Med  . Order #: [id number] Class: Normal    Allergies Other  Family History  Problem Relation Age of Onset  . Hypertension Mother   . Heart attack Father        d/o MI at 49 yo   . Cancer Neg Hx   . Kidney disease Neg Hx     Social History Social History  Substance Use Topics  . Smoking status: Current Every Day Smoker    Packs/day: 0.50    Years: 40.00    Types: Cigarettes    Start date: 05/12/1974  . Smokeless tobacco: Never Used     Comment: 1/2 pk per day  . Alcohol use No     Comment: occ    Review of Systems  All other systems negative except as documented in the HPI. All pertinent positives and negatives as reviewed in the HPI. ____________________________________________   PHYSICAL EXAM:  VITAL SIGNS: ED Triage Vitals  Enc Vitals Group     BP 02/26/17 1222 122/89     Pulse Rate 02/26/17 1222 81     Resp 02/26/17 1222 20     Temp 02/26/17 1222 98.8 F (37.1 C)     Temp Source 02/26/17 1222 Oral     SpO2 02/26/17 1222 100 %     Weight 02/26/17 1219 145 lb (65.8 kg)     Height 02/26/17 1219 6\' 2"  (1.88 m)    Constitutional: Alert and oriented. Slightly cachectic appearing and in no acute distress. Eyes: Conjunctivae are normal. PERRL. EOMI. Head: Atraumatic. Nose: No congestion/rhinnorhea. Mouth/Throat: Mucous membranes are moist.  Oropharynx non-erythematous. Neck: No stridor.  No meningeal signs.   Cardiovascular: Normal rate, regular rhythm. Good peripheral circulation. Grossly normal heart sounds.   Respiratory: Normal respiratory effort.  No retractions. Lungs with abnormal lung sounds and asymmetric: expiratory rhonchi?. Gastrointestinal: Soft and nontender. No distention.    Musculoskeletal: No lower extremity tenderness nor edema. No gross deformities of extremities. Neurologic:  Normal speech and language. No gross focal neurologic deficits are appreciated.  Skin:  Skin is warm, dry and intact. No rash noted.   ____________________________________________   LABS (all labs ordered are listed, but only abnormal results are displayed)  Labs Reviewed  CBC WITH DIFFERENTIAL/PLATELET - Abnormal; Notable for the following:       Result Value   WBC 13.2 (*)    Neutro Abs 11.0 (*)    Monocytes Absolute 1.4 (*)    All other components within normal limits  COMPREHENSIVE METABOLIC PANEL - Abnormal; Notable for the following:    Sodium 134 (*)    Chloride 98 (*)    Glucose, Bld 118 (*)    Creatinine, Ser 4.27 (*)    Calcium 8.7 (*)    Albumin 3.3 (*)    AST 13 (*)    ALT 11 (*)    GFR calc non Af Amer 14 (*)  GFR calc Af Amer 16 (*)    All other components within normal limits  TROPONIN I - Abnormal; Notable for the following:    Troponin I 0.05 (*)    All other components within normal limits  MAGNESIUM   ____________________________________________  EKG   EKG Interpretation  Date/Time:  Thursday February 26 2017 13:40:05 EDT Ventricular Rate:  155 PR Interval:    QRS Duration: 94 QT Interval:  347 QTC Calculation: 558 R Axis:   -69 Text Interpretation:  Sinus tachycardia vs SVT LAD, consider left anterior fascicular block RSR' in V1 or V2, probably normal variant Repolarization abnormality, prob rate related Minimal ST elevation, anterolateral leads Prolonged QT interval Confirmed by Merrily Pew [phone]) on 02/26/2017 2:32:55 PM       ____________________________________________  RADIOLOGY  Dg Chest 2 View  Result Date: 02/26/2017 CLINICAL DATA:  Syncopal episodes today, head dialysis today, coughing EXAM: CHEST  2 VIEW COMPARISON:  Chest x-ray of 05/21/2016 FINDINGS: There is a masslike opacity within the posterior aspect of the  right upper lobe. This probably represents pneumonia, with neoplasm less likely in view of the normal chest x-ray in January of 2018. However, follow-up chest x-ray after interval treatment is recommended to exclude underlying malignancy. Otherwise the lungs are clear and somewhat hyperaerated. Mediastinal and hilar contours are unremarkable. The heart is within normal limits in size. No bony abnormality is seen. IMPRESSION: 1. Masslike opacity in the posterior inferior aspect of the right upper lobe. Probable pneumonia but recommend followup to exclude malignancy. 2. Hyperaeration consistent with emphysema . Electronically Signed   By: Ivar Drape M.D.   On: 02/26/2017 13:29   Ct Chest Wo Contrast  Result Date: 02/26/2017 CLINICAL DATA:  Lightheadedness after dialysis. Abnormal chest x-ray. End-stage renal disease. EXAM: CT CHEST WITHOUT CONTRAST TECHNIQUE: Multidetector CT imaging of the chest was performed following the standard protocol without IV contrast. COMPARISON:  Chest CT 04/06/2010 FINDINGS: Cardiovascular: Normal heart size. No pericardial effusion. No notable atherosclerotic calcification. Mediastinum/Nodes: Mild right hilar adenopathy, nonspecific in this setting. Measurement of discrete nodes is limited due to lack of intravenous contrast and intra-abdominal fat. Lungs/Pleura: There is a dense opacity in the posterior segment right upper lobe with neighboring ground-glass density. Centrally no air bronchograms are seen. This is likely pneumonia in this patient with leukocytosis but need follow-up to exclude mass with superimposed consolidation. No retraction of the adjacent major fissure. Ground-glass opacity with volume loss in the right lower lobe posteriorly, atelectasis versus infection. Generalized airway thickening. Paraseptal and mild centrilobular emphysematous change. Upper Abdomen: Bilateral renal atrophy. Musculoskeletal: Prominent muscular atrophy. No acute or aggressive finding.  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 The lipoma measures up to 5.1 cm. Asymmetric breast tissue on the right with more lateral nodular density, stable since 2011. IMPRESSION: 1. Masslike opacity in the right upper lobe that again favors pneumonia in this patient with leukocytosis. Followup PA and lateral chest X-ray is recommended in 3-4 weeks following trial of antibiotic therapy to ensure resolution and exclude underlying malignancy. 2. Mild right hilar adenopathy attributed to #1. 3. 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 4. Bronchitic airway thickening and mild emphysema. Electronically Signed   By: Monte Fantasia M.D.   On: 02/26/2017 14:44    ____________________________________________   PROCEDURES  Procedure(s) performed:   Procedures  CRITICAL CARE Performed by: Merrily Pew  Total critical care time: 55 minutes Critical care time was exclusive of separately billable procedures and treating other patients. Critical care was necessary to treat or prevent imminent or life-threatening deterioration. Critical care was time spent personally by me on the following activities: development of treatment plan with patient and/or surrogate as well as nursing, discussions with consultants, evaluation of patient's response to treatment, examination of patient, obtaining history from patient or surrogate, ordering and performing treatments and interventions, ordering and review of laboratory studies, ordering and review of radiographic studies, pulse oximetry and re-evaluation of patient's condition.  ____________________________________________   INITIAL IMPRESSION / ASSESSMENT AND PLAN / ED COURSE  Pertinent labs & imaging results that were available during my care of the patient were reviewed by me and considered in my medical decision making (see chart for  details).  I suspect his symptoms are likely related to hypovolemia as the improved with oral hydration however he is at high risk for otheretiologies so we'll evaluate his electrolytes EKG and a chest x-ray based on his abnormal lung sounds. We'll also give him a 500 mL bolus and let him drink fluids by mouth. After reassured no medical emergencies we'll ask him to ambulate and she was symptoms improved. Less likely a neurologic or cardiac cause secondary to the nature of the sensation.  Was brought an ECG that showed the patient to be A. Fib RVR. On review of records he has no history of the same. I went to evaluate the patient and on my exam I repeated another EKG which looked more consistent with SVT. He was getting his IV started and fluids were started as well. I perform vagal maneuvers with a Beaver maneuver that his legs above his head and his heart rate slowed to a sinus rhythm in the 80s to 120s with intermittent PACs and PVCs. We'll continue to monitor. His chest x-ray shows questionable pneumonia versus mass as he has not really had a fever or productive cough recently and has a long history of smoking and I will add on a CT scan to evaluate further.  Initial improvement in HR but now back in 150-160 range. Will try adenosine for diagnostic and possibly therapeutic reasons. Another bolus.    Does reveal what appears to be likely atrialf fibrillation. We'll start on diltiazem. Has end-stage renal disease or discussed with pharmacy and it sounds like Eliquis and warfarin or related only long-term options so will talk to cardiology about their recommendations first. Will talk to medicine about admission to the hospital for the same.   ____________________________________________  FINAL CLINICAL IMPRESSION(S) / ED DIAGNOSES  Final diagnoses:  Atrial fibrillation with rapid ventricular response ([place])  Community acquired pneumonia of right upper lobe of lung ([place])     MEDICATIONS GIVEN  DURING THIS VISIT:  Medications  azithromycin (ZITHROMAX) 500 mg in dextrose 5 % 250 mL IVPB (500 mg Intravenous New Bag/Given 02/26/17 1540)  heparin ADULT infusion 100 units/mL (25000 units/274mL sodium chloride 0.45%) (1,100 Units/hr Intravenous New Bag/Given 02/26/17 1622)  Warfarin - Pharmacist Dosing Inpatient (not administered)  warfarin (COUMADIN) tablet 5 mg (not administered)  sodium chloride 0.9 % bolus 500 mL (0 mLs Intravenous Stopped 02/26/17 1511)  sodium chloride 0.9 % bolus 500 mL (500 mLs Intravenous New Bag/Given 02/26/17 1450)  adenosine (ADENOCARD) 6 MG/2ML injection 6 mg (6 mg Intravenous Given 02/26/17 1444)  diltiazem (CARDIZEM) injection 15 mg (15 mg Intravenous Given 02/26/17 1507)  diltiazem (CARDIZEM) 100 mg in dextrose 5% 120mL (1 mg/mL)  infusion (5 mg/hr Intravenous New Bag/Given 02/26/17 1504)  cefTRIAXone (ROCEPHIN) 1 g in dextrose 5 % 50 mL IVPB (0 g Intravenous Stopped 02/26/17 1541)  heparin bolus via infusion 4,000 Units (4,000 Units Intravenous Bolus from Bag 02/26/17 1622)     NEW OUTPATIENT MEDICATIONS STARTED DURING THIS VISIT:  New Prescriptions   No medications on file    Note:  This document was prepared using Dragon voice recognition software and may include unintentional dictation errors.   Merrily Pew, MD 02/26/17 2236    Merrily Pew, MD 02/26/17 [phone]

## 2017-02-26 NOTE — ED Triage Notes (Signed)
Pt returns home from dialysis today, complaining of light headed when standing.  States this has never happened, been doing dialysis 2 years.

## 2017-02-27 ENCOUNTER — Encounter (HOSPITAL_COMMUNITY): Payer: Self-pay | Admitting: Adult Health

## 2017-02-27 DIAGNOSIS — Z992 Dependence on renal dialysis: Secondary | ICD-10-CM

## 2017-02-27 DIAGNOSIS — I4891 Unspecified atrial fibrillation: Principal | ICD-10-CM

## 2017-02-27 DIAGNOSIS — R55 Syncope and collapse: Secondary | ICD-10-CM | POA: Diagnosis not present

## 2017-02-27 DIAGNOSIS — I10 Essential (primary) hypertension: Secondary | ICD-10-CM | POA: Diagnosis not present

## 2017-02-27 DIAGNOSIS — N186 End stage renal disease: Secondary | ICD-10-CM | POA: Diagnosis not present

## 2017-02-27 DIAGNOSIS — J181 Lobar pneumonia, unspecified organism: Secondary | ICD-10-CM | POA: Diagnosis not present

## 2017-02-27 LAB — T4, FREE: Free T4: 1.25 ng/dL — ABNORMAL HIGH (ref 0.61–1.12)

## 2017-02-27 LAB — CBC
HCT: 37.4 % — ABNORMAL LOW (ref 39.0–52.0)
HEMOGLOBIN: 11.9 g/dL — AB (ref 13.0–17.0)
MCH: 30.7 pg (ref 26.0–34.0)
MCHC: 31.8 g/dL (ref 30.0–36.0)
MCV: 96.4 fL (ref 78.0–100.0)
Platelets: 142 10*3/uL — ABNORMAL LOW (ref 150–400)
RBC: 3.88 MIL/uL — AB (ref 4.22–5.81)
RDW: 13.6 % (ref 11.5–15.5)
WBC: 10.2 10*3/uL (ref 4.0–10.5)

## 2017-02-27 LAB — MRSA PCR SCREENING: MRSA BY PCR: NEGATIVE

## 2017-02-27 LAB — TROPONIN I
TROPONIN I: 0.06 ng/mL — AB (ref <0.03)
TROPONIN I: 0.06 ng/mL — AB (ref <0.03)

## 2017-02-27 LAB — HEPARIN LEVEL (UNFRACTIONATED)

## 2017-02-27 LAB — PROTIME-INR
INR: 1.19
Prothrombin Time: 15 seconds (ref 11.4–15.2)

## 2017-02-27 LAB — HIV ANTIBODY (ROUTINE TESTING W REFLEX): HIV Screen 4th Generation wRfx: NONREACTIVE

## 2017-02-27 MED ORDER — DILTIAZEM HCL-DEXTROSE 100-5 MG/100ML-% IV SOLN (PREMIX)
INTRAVENOUS | Status: AC
  Filled 2017-02-27: qty 100

## 2017-02-27 MED ORDER — DILTIAZEM HCL 30 MG PO TABS
30.0000 mg | ORAL_TABLET | Freq: Three times a day (TID) | ORAL | Status: DC
  Administered 2017-02-27 – 2017-02-28 (×3): 30 mg via ORAL
  Filled 2017-02-27 (×3): qty 1

## 2017-02-27 MED ORDER — HEPARIN SODIUM (PORCINE) 5000 UNIT/ML IJ SOLN
5000.0000 [IU] | Freq: Three times a day (TID) | INTRAMUSCULAR | Status: DC
  Administered 2017-02-27 – 2017-02-28 (×3): 5000 [IU] via SUBCUTANEOUS
  Filled 2017-02-27 (×3): qty 1

## 2017-02-27 MED ORDER — WARFARIN SODIUM 5 MG PO TABS
5.0000 mg | ORAL_TABLET | Freq: Once | ORAL | Status: AC
  Administered 2017-02-27: 5 mg via ORAL
  Filled 2017-02-27: qty 1

## 2017-02-27 NOTE — ED Notes (Signed)
Called Cardiology at [place].

## 2017-02-27 NOTE — Discharge Instructions (Addendum)

## 2017-02-27 NOTE — Consult Note (Signed)
Cardiology Consultation:   Patient ID: Steven Sellers; [id number]; [birth year]   Admit date: 02/26/2017 Date of Consult: 02/27/2017  Primary Care Provider: Sharilyn Sites, MD Primary Cardiologist: Dorris Carnes, MD   Patient Profile:   Steven Sellers is a 56 y.o. male with a hx of remote PE (no longer on anticoagulation), Hypertension, seizures (witnessed during 09/2016 admission), seen by cardiology 09/2016 for syncope, and not found to be cardiac in etiology at that time, with event monitor negative for arrhythmias, anemia, ESRD on dialysis since 2017- MWF, muscular dystrophy, ETOH abuse, and ongoing tobacco abuse, who is being seen today for the evaluation of new onset atrial fibrillation at the request of Dr. Sheran Fava, Hospitalist. .  History of Present Illness:   Mr. Finigan presented to the ER with complaints of near syncope, with subsequent EKG revealing atrial fibrillation with RVR, rate of 132 bpm. He has had complaints of cough and chest congestion, with chills over the last week. After dialysis yesterday, he became lightheaded when he got home and had near syncopal episode. He sat down for a while and drank water, but due to ongoing symptoms his wife insisted that he come to ER for evaluation.    On arrival BP 110/76, HR 155 bpm, O2 Sat 100%, afebrile. Pertinent labs, INR 1.19, PT 15.0, Hgb 13.5,  HCT 41.4,  WBC 13.2,  Troponin 0.06, X 3. Na 134, Cl 98, Glucose 118, Creatinine 4.27. CT scan of chest demonstrated mass-like opacity in the right upper lobe favoring pneumonia and emphysema. CXR also found mass-like opacity in the posterior inferior aspect of the right upper lobe.   He was treated with diltiazem gtt and ultimately converted spontaneously to sinus rhythm. Started on IV antibiotics with rocephin, Zithromax, heparin gtt and give one dose of po coumadin 5 mg. He denies chest pain or edema, denies medical non-compliance. He has not had any ETOH for one year. Feeling much better now.     Past Medical History:  Diagnosis Date  . Alcohol use   . Anemia   . ESRD (end stage renal disease) ([place])    Hemodialysis [facility]- [facility] in [place]  . Hypertension   . Muscular dystrophy    Limb Girdle  . Pulmonary embolism ([place]) 2011  . Seizures ([place]) 09/2016  . Tobacco abuse   . Vision abnormalities     Past Surgical History:  Procedure Laterality Date  . A/V FISTULAGRAM Left 11/05/2016   Procedure: A/V Fistulagram;  Surgeon: Waynetta Sandy, MD;  Location: [facility];  Service: Cardiovascular;  Laterality: Left;  . AV FISTULA PLACEMENT Left 12/26/2015   Procedure: CREATION OF LEFT RADIAL-CEPHALIC ARTERIOVENOUS FISTULA FOR HEMODIALYSIS;  Surgeon: Vickie Epley, MD;  Location: [facility];  Service: Vascular;  Laterality: Left;  . AV FISTULA PLACEMENT Left 04/28/2016   Procedure: REVISION OF LEFT RADIAL CEPHALIC ARTERIOVENOUS (AV) FISTULA  FOR DURABLE HEMODIALYSIS;  Surgeon: Vickie Epley, MD;  Location: [facility];  Service: Vascular;  Laterality: Left;  . CARDIAC SURGERY    . CENTRAL VENOUS CATHETER INSERTION Right 12/04/2015   Procedure: INSERTION OF TUNNELED HEMODIALYSIS CATHETER RIGHT INTERNAL JUGULAR;  Surgeon: Vickie Epley, MD;  Location: [facility];  Service: General;  Laterality: Right;  . IR REMOVAL TUN CV CATH W/O FL  12/10/2016  . REVISON OF ARTERIOVENOUS FISTULA Left 07/07/2016   Procedure: CREATION OF LEFT ARM BRACHIOCEPHALIC FISTULA;  Surgeon: Waynetta Sandy, MD;  Location: [facility];  Service: Vascular;  Laterality: Left;  Home Medications:  Prior to Admission medications   Medication Sig Start Date End Date Taking? Authorizing Provider  acetaminophen (TYLENOL) 500 MG tablet Take 1,000 mg by mouth every 6 (six) hours as needed for mild pain or moderate pain.   Yes [provider]  amLODipine (NORVASC) 10 MG tablet Take 10 mg by mouth daily.   Yes [provider]  furosemide (LASIX) 20 MG tablet Take 40 mg by mouth at  bedtime.    Yes [provider]  lidocaine-prilocaine (EMLA) cream Apply 1 application topically once.  01/06/17  Yes [provider]  sevelamer carbonate (RENVELA) 0.8 g PACK packet Take 3.2 g by mouth 3 (three) times daily with meals.  01/05/17  Yes [provider]  zolpidem (AMBIEN) 5 MG tablet Take 5 mg by mouth at bedtime.   Yes [provider]    Inpatient Medications: Scheduled Meds: . azithromycin  500 mg Oral Q24H  . coumadin book   Does not apply Once  . diltiazem  30 mg Oral Q8H  . sevelamer carbonate  3.2 g Oral TID WC  . sodium chloride flush  3 mL Intravenous Q12H  . warfarin  5 mg Oral Once  . warfarin   Does not apply Once  . Warfarin - Pharmacist Dosing Inpatient   Does not apply Q24H  . zolpidem  5 mg Oral QHS   Continuous Infusions: . sodium chloride    . cefTRIAXone (ROCEPHIN)  IV     PRN Meds: acetaminophen **OR** acetaminophen, ondansetron **OR** ondansetron (ZOFRAN) IV  Allergies:    Allergies  Allergen Reactions  . Other Other (See Comments)    IV contrast- Renal issues    Social History:   Social History   Social History  . Marital status: Married    Spouse name: N/A  . Number of children: N/A  . Years of education: N/A   Occupational History  . Not on file.   Social History Main Topics  . Smoking status: Current Every Day Smoker    Packs/day: 0.50    Years: 40.00    Types: Cigarettes    Start date: 05/12/1974  . Smokeless tobacco: Never Used     Comment: 1/2 pk per day  . Alcohol use No     Comment: occ  . Drug use: No  . Sexual activity: Not on file   Other Topics Concern  . Not on file   Social History Narrative  . No narrative on file    Family History:    Family History  Problem Relation Age of Onset  . Hypertension Mother   . Heart attack Father        d/o MI at 77 yo   . Cancer Neg Hx   . Kidney disease Neg Hx      ROS:  Please see the history of present illness.  ROS  All  other ROS reviewed and negative.     Physical Exam/Data:   Vitals:   02/27/17 0640 02/27/17 0700 02/27/17 0730 02/27/17 0800  BP: 129/73 122/87 136/78 114/69  Pulse: 79 74 90 77  Resp: (!) 23 19    Temp:      TempSrc:      SpO2: 96% 96% 94% 95%  Weight:      Height:        Intake/Output Summary (Last 24 hours) at 02/27/17 1017 Last data filed at 02/26/17 1654  Gross per 24 hour  Intake  1050 ml  Output                0 ml  Net             1050 ml   Filed Weights   02/26/17 1219  Weight: 145 lb (65.8 kg)   Body mass index is 18.62 kg/m.  General: Thin male, in no acute distress. HEENT: normal Lymph: no adenopathy Neck: no JVD. Left carotid bruit noted,  Endocrine:  No thryomegaly Vascular: Left carotid bruits; FA pulses 2+ bilaterally without bruits Left AV fistula with good palpable thrill.  Cardiac:  normal S1, S2; RRR; 1/6 systolic murmur. Lungs:  Crackles in the right base to the middle lobe, no wheezing or coughing.  Abd: soft, nontender, no hepatomegaly  Ext: no edema Musculoskeletal:  No deformities, BUE and BLE strength normal and equal Skin: warm and dry  Neuro:  CNs 2-12 intact, no focal abnormalities noted Psych:  Normal affect   EKG:  The EKG was personally reviewed and demonstrates:  Atrial fib with RVR, rate of 132 bpm  Telemetry:  Telemetry was personally reviewed and demonstrates:  NSR rates in the 70;s.   Relevant CV Studies:  2D Echo 12/2015 Study Conclusions - Left ventricle: The cavity size was normal. Wall thickness was increased in a pattern of mild LVH. Systolic function was normal. The estimated ejection fraction was in the range of 55% to 60%. Diastolic function is abnormal, indeterminate grade. Wall motion was normal; there were no regional wall motion abnormalities. - Aortic valve: Mildly calcified annulus. Trileaflet; mildly thickened leaflets. Valve area (VTI): 2.63 cm^2. Valve area (Vmax): 2.56 cm^2. Valve  area (Vmean): 2.31 cm^2. - Mitral valve: There was mild regurgitation. - Left atrium: The atrium was moderately dilated. - Atrial septum: No defect or patent foramen ovale was identified. - Pulmonary arteries: Systolic pressure was moderately increased. PA peak pressure: 56 mm Hg (S). - Technically adequate study.  Laboratory Data:  Chemistry  Recent Labs Lab 02/26/17 1251  NA 134*  K 4.1  CL 98*  CO2 25  GLUCOSE 118*  BUN 14  CREATININE 4.27*  CALCIUM 8.7*  GFRNONAA 14*  GFRAA 16*  ANIONGAP 11     Recent Labs Lab 02/26/17 1251  PROT 7.2  ALBUMIN 3.3*  AST 13*  ALT 11*  ALKPHOS 79  BILITOT 1.2   Hematology  Recent Labs Lab 02/26/17 1251 02/26/17 1725 02/27/17 0514  WBC 13.2* 13.9* 10.2  RBC 4.32 4.05* 3.88*  HGB 13.5 12.6* 11.9*  HCT 41.4 39.1 37.4*  MCV 95.8 96.5 96.4  MCH 31.3 31.1 30.7  MCHC 32.6 32.2 31.8  RDW 13.7 13.8 13.6  PLT 163 140* 142*   Cardiac Enzymes  Recent Labs Lab 02/26/17 1251 02/26/17 1725 02/26/17 2317 02/27/17 0514  TROPONINI 0.05* 0.06* 0.06* 0.06*   No results for input(s): TROPIPOC in the last 168 hours.   Radiology/Studies:  Dg Chest 2 View  Result Date: 02/26/2017 CLINICAL DATA:  Syncopal episodes today, head dialysis today, coughing EXAM: CHEST  2 VIEW COMPARISON:  Chest x-ray of 05/21/2016 FINDINGS: There is a masslike opacity within the posterior aspect of the right upper lobe. This probably represents pneumonia, with neoplasm less likely in view of the normal chest x-ray in January of 2018. However, follow-up chest x-ray after interval treatment is recommended to exclude underlying malignancy. Otherwise the lungs are clear and somewhat hyperaerated. Mediastinal and hilar contours are unremarkable. The heart is within normal limits in size. No bony  abnormality is seen. IMPRESSION: 1. Masslike opacity in the posterior inferior aspect of the right upper lobe. Probable pneumonia but recommend followup to exclude  malignancy. 2. Hyperaeration consistent with emphysema . Electronically Signed   By: Ivar Drape M.D.   On: 02/26/2017 13:29   Ct Chest Wo Contrast  Result Date: 02/26/2017 CLINICAL DATA:  Lightheadedness after dialysis. Abnormal chest x-ray. End-stage renal disease. EXAM: CT CHEST WITHOUT CONTRAST TECHNIQUE: Multidetector CT imaging of the chest was performed following the standard protocol without IV contrast. COMPARISON:  Chest CT 04/06/2010 FINDINGS: Cardiovascular: Normal heart size. No pericardial effusion. No notable atherosclerotic calcification. Mediastinum/Nodes: Mild right hilar adenopathy, nonspecific in this setting. Measurement of discrete nodes is limited due to lack of intravenous contrast and intra-abdominal fat. Lungs/Pleura: There is a dense opacity in the posterior segment right upper lobe with neighboring ground-glass density. Centrally no air bronchograms are seen. This is likely pneumonia in this patient with leukocytosis but need follow-up to exclude mass with superimposed consolidation. No retraction of the adjacent major fissure. Ground-glass opacity with volume loss in the right lower lobe posteriorly, atelectasis versus infection. Generalized airway thickening. Paraseptal and mild centrilobular emphysematous change. Upper Abdomen: Bilateral renal atrophy. Musculoskeletal: Prominent muscular atrophy. No acute or aggressive finding. 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 The lipoma measures up to 5.1 cm. Asymmetric breast tissue on the right with more lateral nodular density, stable since 2011. IMPRESSION: 1. Masslike opacity in the right upper lobe that again favors pneumonia in this patient with leukocytosis. Followup PA and lateral chest X-ray is recommended in 3-4 weeks following trial of antibiotic therapy to ensure resolution and exclude underlying malignancy. 2. Mild right hilar adenopathy attributed to  #1. 3. 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 4. Bronchitic airway thickening and mild emphysema. Electronically Signed   By: Monte Fantasia M.D.   On: 02/26/2017 14:44    Assessment and Plan:   1. Atrial fibrillation with RVR: Newly documented and converted spontaneously to sinus rhythm with heart rate control on diltiazem infusion. Remains on diltiazem gtt at 5 mg hour. Will transition to 30 mg Q 8 hours. He states he has been on diltiazem before but was taken off of this due to slow HR - will have to follow. Now in the 70's. CHADS VASC Score of 1. Prior documentation of bleeding on coumadin for PE. Has been started on heparin gtt and has been given one dose of coumadin 5 mg. With renal disease, DOAC may not be good choice. Will discuss with Dr. Domenic Polite need to start anticoagulation.  2. Demand Ischemia: Troponin elevated to 0.06. Likely from rapid HR with elevations into the 150's. No chest pain associated.   3. Hx of Hypertension:  Home medications have him on amlodipine and lasix. Will see if he tolerates low dose diltiazem and stop Norvasc.  4.Pneumonia: Now being treated with IV antibiotics. Some improvement in symptoms.   5. ESRD: Dialysis on T, Thurs, Sat.   6. Hx of Seizures: Etiology not confirmed. Prior history of ETOH abuse.   7.  Ongoing Tobacco abuse: Down to 1/2-3/4 ppd.    For questions or updates, please contact [facility] Please consult [email or web address] for contact info under Cardiology/STEMI.   Signed, Jory Sims DNP, ANP, AACC  02/27/2017 10:17 AM    Attending note:  Patient seen and examined. Reviewed records and discussed the  case with Ms. West Pugh. Mr. Niess presents to the hospital after an episode of near syncope that occurred while standing yesterday. During the week prior he had been experiencing cough and chest congestion with subjective fevers. Went to dialysis as normal, when he got back  home is when the lightheadedness was experienced. He was taken to the ER which time he was found to be in rapid atrial fibrillation, was not aware of palpitations and had no chest pain at that time. He was treated with intravenous diltiazem and spontaneously converted to sinus rhythm where he remains today. He has no previously documented history of atrial arrhythmias, although a prior history of syncope. CHADSVASC score is 1.  On examination today he appears comfortable. Heart rate is in the 80s and sinus rhythm, systolic blood pressure 676 to 130 range. Lungs exhibit scattered rhonchi mainly on the right. Cardiac exam with RRR no gallop. Labwork shows flat troponin I elevation at 0.06. I personally reviewed his tracing from 02/26/2017 which showed rapid atrial fibrillation with R' in lead V1 and V2, no acute ST segment changes. Chest CT showed mass-like opacity in the right upper lobe, currently being treated for pneumonia with antibiotics.  Patient presents with newly documented atrial fibrillation with RVR. No prior documented arrhythmia, but would still question potential PAF. CHADSVASC score is 1. He has spontaneously converted to sinus rhythm with heart rate control. Agree with conversion from IV diltiazem to oral diltiazem, need to follow heart rate and blood pressure response closely given report of possible previous intolerances. Would agree with anticoagulation for now, Coumadin is reasonable with ESRD on hemodialysis. Follow-up echocardiogram has also been ordered. If he continues to manifest recurrent symptomatic atrial fibrillation, may need to be considered for antiarrhythmic therapy. Would recommend observation on telemetry with medication adjustments being made, likely discharge within the next 24 hours.  Satira Sark, M.D., F.A.C.C.

## 2017-02-27 NOTE — Progress Notes (Signed)
[facility] for Heparin and Coumadin Indication: atrial fibrillation  Allergies  Allergen Reactions  . Other Other (See Comments)    IV contrast- Renal issues   Patient Measurements: Height: 6\' 2"  (188 cm) Weight: 145 lb (65.8 kg) IBW/kg (Calculated) : 82.2 HEPARIN DW (KG): 65.8  Vital Signs: BP: 114/69 (10/19 0800) Pulse Rate: 77 (10/19 0800)  Labs:  Recent Labs  02/26/17 1251 02/26/17 1637 02/26/17 1725 02/26/17 2317 02/27/17 0514  HGB 13.5  --  12.6*  --  11.9*  HCT 41.4  --  39.1  --  37.4*  PLT 163  --  140*  --  142*  APTT  --  57*  --   --   --   LABPROT  --  14.3  --   --  15.0  INR  --  1.12  --   --  1.19  HEPARINUNFRC  --   --   --   --  <0.10*  CREATININE 4.27*  --   --   --   --   TROPONINI 0.05*  --  0.06* 0.06* 0.06*   Estimated Creatinine Clearance: 17.8 mL/min (A) (by C-G formula based on SCr of 4.27 mg/dL (H)).  Medical History: Past Medical History:  Diagnosis Date  . Alcohol use   . Anemia   . ESRD (end stage renal disease) ([place])    Hemodialysis [facility]- [facility] in [place]  . Hypertension   . Muscular dystrophy    Limb Girdle  . Pulmonary embolism ([place]) 2011  . Tobacco abuse   . Vision abnormalities    Medications:   (Not in a hospital admission)  Assessment: 56yo male with afib.  Pt has been doing dialysis x 2 years.  C/O light headed.  Asked to initiate Heparin.  Heparin level is below target.    Goal of Therapy:  Heparin level 0.3-0.7 units/ml Monitor platelets by anticoagulation protocol: Yes  INR 2-3   Plan:  Increase Heparin infusion at 1400 units/hr Coumadin 5mg  today x 1 Protime/INR, heparin level and CBC daily Provide education  Hart Robinsons A 02/27/2017,8:34 AM

## 2017-02-27 NOTE — Progress Notes (Signed)
PROGRESS NOTE  Steven Sellers  SPQ:[id number] DOB: 1960/11/08 DOA: 02/26/2017 PCP: Sharilyn Sites, MD  Brief Narrative:   Steven Sellers is a 56 y.o. male with history of muscular dystrophy, anemia, hypertension, end-stage renal disease, pulmonary embolism no longer on anticoagulation who presented to the emergency department today secondary to presyncopal episodes, increased cough and chills. He was found to have dehydration, possible CAP, and a-fib with RVR.  He spontaneously converted to NSR in the ER.  ECHO is pending.  Cardiology has transitioned to oral diltiazem.  If his heart rate remains stable, he will have dialysis in the morning and then be discharged home.    Assessment & Plan:   Active Problems:   Atrial fibrillation, new onset ([place])  Presyncope, likely related to underlying pneumonia, a-fib, and mild dehydration after HD.  Symptoms improved with IVF -  D/c lasix -  No further IVF, encourage PO  A-fib with RVR, spontaneously converted to NSR, likely triggered by dehydration and pneumonia -  Troponins:  Mildly elevated but flat -  TSH:  Minimally low and fT4 marginally elevated, but doubt hyperthyroidism -  ECHO from May demonstrates EF 55-60% with grade 1 DD, mild MR, trivial TR.   -  Assuming repeat ECHO is stable, CHADs2vasc = 1 -  Continue warfarin, will need INR check in 1 week  -  Also, he has had problems with bradycardia on diltiazem and metoprolol so should be followed by cardiology as he may be at risk for tachy-brady syndrome  Minimally elevated troponin, level expected for ESRD, chest pain free -  See above  Hypertension, blood pressure low normal -  Holding norvasc and lasix  CAP probably of both right upper and right lower lobe -  Ceftriaxone and azithromycin -  Hold off on steroids for now as on room air and will not change LOS -  Repeat CXR in 3-4 weeks  ESRD, had HD today and will likely be discharged before next session -  Nephrology  consult placed for HD tomorrow   Muscular dystrophy, does not use cane or walker  Hx of PE, on warfarin for 8 months, then stopped.  Incited by chest wall trauma.    DVT prophylaxis: heparin  Code Status: full code Family Communication:  Patient and wife Disposition Plan: likely to home tomorrow after HD  Consultants:   Cardiology  Nephrology  Procedures:  ECHO   Antimicrobials:  Anti-infectives    Start     Dose/Rate Route Frequency Ordered Stop   02/26/17 1800  cefTRIAXone (ROCEPHIN) 1 g in dextrose 5 % 50 mL IVPB     1 g 100 mL/hr over 30 Minutes Intravenous Every 24 hours 02/26/17 1703 03/05/17 1759   02/26/17 1730  ceFAZolin (ANCEF) IVPB 2g/100 mL premix  Status:  Discontinued     2 g 200 mL/hr over 30 Minutes Intravenous  Once 02/26/17 1636 02/26/17 1704   02/26/17 1730  azithromycin (ZITHROMAX) tablet 500 mg     500 mg Oral Every 24 hours 02/26/17 1703 03/05/17 1729   02/26/17 1500  cefTRIAXone (ROCEPHIN) 1 g in dextrose 5 % 50 mL IVPB     1 g 100 mL/hr over 30 Minutes Intravenous  Once 02/26/17 1457 02/26/17 1541   02/26/17 1500  azithromycin (ZITHROMAX) 500 mg in dextrose 5 % 250 mL IVPB     500 mg 250 mL/hr over 60 Minutes Intravenous  Once 02/26/17 1457 02/26/17 1653       Subjective:  Still  having some cough, but improved since coming to the ER.  Could not feel his a-fib and did not know when he spontaneously converted to NSR yesterday.  Denies lightheadedness, dizziness when sitting up in bed and hasn't been walking around much to know if that makes him dizzy.    Objective: Vitals:   02/27/17 1330 02/27/17 1400 02/27/17 1430 02/27/17 1512  BP: (!) 126/100 (!) 132/91 128/88 129/85  Pulse: 81 85 88 84  Resp: (!) 21 (!) 21 19 18   Temp:    98.6 F (37 C)  TempSrc:    Oral  SpO2: 97% 96% 97% 99%  Weight:    65.8 kg (145 lb)  Height:    6\' 2"  (1.88 m)    Intake/Output Summary (Last 24 hours) at 02/27/17 1619 Last data filed at 02/27/17 1500   Gross per 24 hour  Intake           594.17 ml  Output                0 ml  Net           594.17 ml   Filed Weights   02/26/17 1219 02/27/17 1512  Weight: 65.8 kg (145 lb) 65.8 kg (145 lb)    Examination:  General exam:  Adult male.  No acute distress.  HEENT:  NCAT, MMM Respiratory system: Diminished at the right mid-back with faint rales, otherwise clear, no wheezes or rhonchi Cardiovascular system: Regular rate and rhythm, normal S1/S2. No murmurs, rubs, gallops or clicks.  Warm extremities Gastrointestinal system: Normal active bowel sounds, soft, nondistended, nontender. MSK:  Normal tone and bulk, no lower extremity edema Neuro:  Grossly intact    Data Reviewed: I have personally reviewed following labs and imaging studies  CBC:  Recent Labs Lab 02/26/17 1251 02/26/17 1725 02/27/17 0514  WBC 13.2* 13.9* 10.2  NEUTROABS 11.0*  --   --   HGB 13.5 12.6* 11.9*  HCT 41.4 39.1 37.4*  MCV 95.8 96.5 96.4  PLT 163 140* 563*   Basic Metabolic Panel:  Recent Labs Lab 02/26/17 1251  NA 134*  K 4.1  CL 98*  CO2 25  GLUCOSE 118*  BUN 14  CREATININE 4.27*  CALCIUM 8.7*  MG 2.0   GFR: Estimated Creatinine Clearance: 17.8 mL/min (A) (by C-G formula based on SCr of 4.27 mg/dL (H)). Liver Function Tests:  Recent Labs Lab 02/26/17 1251  AST 13*  ALT 11*  ALKPHOS 79  BILITOT 1.2  PROT 7.2  ALBUMIN 3.3*   No results for input(s): LIPASE, AMYLASE in the last 168 hours. No results for input(s): AMMONIA in the last 168 hours. Coagulation Profile:  Recent Labs Lab 02/26/17 1637 02/27/17 0514  INR 1.12 1.19   Cardiac Enzymes:  Recent Labs Lab 02/26/17 1251 02/26/17 1725 02/26/17 2317 02/27/17 0514  TROPONINI 0.05* 0.06* 0.06* 0.06*   BNP (last 3 results) No results for input(s): PROBNP in the last 8760 hours. HbA1C: No results for input(s): HGBA1C in the last 72 hours. CBG: No results for input(s): GLUCAP in the last 168 hours. Lipid Profile: No  results for input(s): CHOL, HDL, LDLCALC, TRIG, CHOLHDL, LDLDIRECT in the last 72 hours. Thyroid Function Tests:  Recent Labs  02/26/17 1725 02/27/17 0553  TSH 0.347*  --   FREET4  --  1.25*   Anemia Panel: No results for input(s): VITAMINB12, FOLATE, FERRITIN, TIBC, IRON, RETICCTPCT in the last 72 hours. Urine analysis:    Component Value Date/Time  COLORURINE YELLOW 05/21/2016 [facility] 05/21/2016 0941   LABSPEC 1.020 05/21/2016 0941   PHURINE 8.0 05/21/2016 0941   GLUCOSEU NEGATIVE 05/21/2016 0941   HGBUR LARGE (A) 05/21/2016 0941   BILIRUBINUR NEGATIVE 05/21/2016 0941   KETONESUR NEGATIVE 05/21/2016 0941   PROTEINUR >300 (A) 05/21/2016 0941   UROBILINOGEN 0.2 04/11/2010 2336   NITRITE NEGATIVE 05/21/2016 0941   LEUKOCYTESUR NEGATIVE 05/21/2016 0941   Sepsis Labs: @LABRCNTIP (procalcitonin:4,lacticidven:4)  )No results found for this or any previous visit (from the past 240 hour(s)).    Radiology Studies: Dg Chest 2 View  Result Date: 02/26/2017 CLINICAL DATA:  Syncopal episodes today, head dialysis today, coughing EXAM: CHEST  2 VIEW COMPARISON:  Chest x-ray of 05/21/2016 FINDINGS: There is a masslike opacity within the posterior aspect of the right upper lobe. This probably represents pneumonia, with neoplasm less likely in view of the normal chest x-ray in January of 2018. However, follow-up chest x-ray after interval treatment is recommended to exclude underlying malignancy. Otherwise the lungs are clear and somewhat hyperaerated. Mediastinal and hilar contours are unremarkable. The heart is within normal limits in size. No bony abnormality is seen. IMPRESSION: 1. Masslike opacity in the posterior inferior aspect of the right upper lobe. Probable pneumonia but recommend followup to exclude malignancy. 2. Hyperaeration consistent with emphysema . Electronically Signed   By: Ivar Drape M.D.   On: 02/26/2017 13:29   Ct Chest Wo Contrast  Result Date:  02/26/2017 CLINICAL DATA:  Lightheadedness after dialysis. Abnormal chest x-ray. End-stage renal disease. EXAM: CT CHEST WITHOUT CONTRAST TECHNIQUE: Multidetector CT imaging of the chest was performed following the standard protocol without IV contrast. COMPARISON:  Chest CT 04/06/2010 FINDINGS: Cardiovascular: Normal heart size. No pericardial effusion. No notable atherosclerotic calcification. Mediastinum/Nodes: Mild right hilar adenopathy, nonspecific in this setting. Measurement of discrete nodes is limited due to lack of intravenous contrast and intra-abdominal fat. Lungs/Pleura: There is a dense opacity in the posterior segment right upper lobe with neighboring ground-glass density. Centrally no air bronchograms are seen. This is likely pneumonia in this patient with leukocytosis but need follow-up to exclude mass with superimposed consolidation. No retraction of the adjacent major fissure. Ground-glass opacity with volume loss in the right lower lobe posteriorly, atelectasis versus infection. Generalized airway thickening. Paraseptal and mild centrilobular emphysematous change. Upper Abdomen: Bilateral renal atrophy. Musculoskeletal: Prominent muscular atrophy. No acute or aggressive finding. 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 The lipoma measures up to 5.1 cm. Asymmetric breast tissue on the right with more lateral nodular density, stable since 2011. IMPRESSION: 1. Masslike opacity in the right upper lobe that again favors pneumonia in this patient with leukocytosis. Followup PA and lateral chest X-ray is recommended in 3-4 weeks following trial of antibiotic therapy to ensure resolution and exclude underlying malignancy. 2. Mild right hilar adenopathy attributed to #1. 3. 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 4. Bronchitic airway thickening and mild emphysema. Electronically  Signed   By: Monte Fantasia M.D.   On: 02/26/2017 14:44     Scheduled Meds: . azithromycin  500 mg Oral Q24H  . coumadin book   Does not apply Once  . diltiazem  30 mg Oral Q8H  . sevelamer carbonate  3.2 g Oral TID WC  . sodium chloride flush  3 mL Intravenous Q12H  . warfarin  5 mg Oral Once  .  warfarin   Does not apply Once  . Warfarin - Pharmacist Dosing Inpatient   Does not apply Q24H  . zolpidem  5 mg Oral QHS   Continuous Infusions: . sodium chloride    . cefTRIAXone (ROCEPHIN)  IV       LOS: 0 days    Time spent: 30 min    Janece Canterbury, MD [facility] Pager [phone]  If 7PM-7AM, please contact night-coverage [email or web address] Password [email or web address] 02/27/2017, 4:19 PM

## 2017-02-27 NOTE — ED Notes (Signed)
Pt continues to await room assignment 

## 2017-02-28 ENCOUNTER — Observation Stay (HOSPITAL_COMMUNITY): Payer: BLUE CROSS/BLUE SHIELD

## 2017-02-28 DIAGNOSIS — J181 Lobar pneumonia, unspecified organism: Secondary | ICD-10-CM | POA: Diagnosis not present

## 2017-02-28 DIAGNOSIS — I4891 Unspecified atrial fibrillation: Secondary | ICD-10-CM | POA: Diagnosis not present

## 2017-02-28 LAB — CBC
HCT: 34.4 % — ABNORMAL LOW (ref 39.0–52.0)
HEMOGLOBIN: 11.1 g/dL — AB (ref 13.0–17.0)
MCH: 30.5 pg (ref 26.0–34.0)
MCHC: 32.3 g/dL (ref 30.0–36.0)
MCV: 94.5 fL (ref 78.0–100.0)
PLATELETS: 163 10*3/uL (ref 150–400)
RBC: 3.64 MIL/uL — ABNORMAL LOW (ref 4.22–5.81)
RDW: 13.7 % (ref 11.5–15.5)
WBC: 7.5 10*3/uL (ref 4.0–10.5)

## 2017-02-28 LAB — RENAL FUNCTION PANEL
ANION GAP: 13 (ref 5–15)
Albumin: 2.8 g/dL — ABNORMAL LOW (ref 3.5–5.0)
BUN: 44 mg/dL — ABNORMAL HIGH (ref 6–20)
CALCIUM: 8.4 mg/dL — AB (ref 8.9–10.3)
CHLORIDE: 101 mmol/L (ref 101–111)
CO2: 23 mmol/L (ref 22–32)
CREATININE: 8.37 mg/dL — AB (ref 0.61–1.24)
GFR, EST AFRICAN AMERICAN: 7 mL/min — AB (ref >60)
GFR, EST NON AFRICAN AMERICAN: 6 mL/min — AB (ref >60)
Glucose, Bld: 155 mg/dL — ABNORMAL HIGH (ref 65–99)
Phosphorus: 5.3 mg/dL — ABNORMAL HIGH (ref 2.5–4.6)
Potassium: 4.3 mmol/L (ref 3.5–5.1)
SODIUM: 137 mmol/L (ref 135–145)

## 2017-02-28 LAB — PROTIME-INR
INR: 1.21
Prothrombin Time: 15.2 seconds (ref 11.4–15.2)

## 2017-02-28 MED ORDER — CEFPODOXIME PROXETIL 200 MG PO TABS: 200.0000 mg | ORAL_TABLET | Freq: Every day | ORAL | 0 refills | Status: DC

## 2017-02-28 MED ORDER — ALTEPLASE 2 MG IJ SOLR
2.0000 mg | Freq: Once | INTRAMUSCULAR | Status: DC | PRN
  Filled 2017-02-28: qty 2

## 2017-02-28 MED ORDER — WARFARIN SODIUM 5 MG PO TABS: 5.0000 mg | ORAL_TABLET | Freq: Once | ORAL | Status: DC

## 2017-02-28 MED ORDER — HEPARIN SODIUM (PORCINE) 1000 UNIT/ML DIALYSIS
1000.0000 [IU] | INTRAMUSCULAR | Status: DC | PRN
  Filled 2017-02-28: qty 1

## 2017-02-28 MED ORDER — PENTAFLUOROPROP-TETRAFLUOROETH EX AERO: 1.0000 "application " | INHALATION_SPRAY | CUTANEOUS | Status: DC | PRN

## 2017-02-28 MED ORDER — WARFARIN SODIUM 2.5 MG PO TABS: 5.0000 mg | ORAL_TABLET | Freq: Every day | ORAL | 0 refills | Status: DC

## 2017-02-28 MED ORDER — LIDOCAINE-PRILOCAINE 2.5-2.5 % EX CREA: 1.0000 "application " | TOPICAL_CREAM | CUTANEOUS | Status: DC | PRN

## 2017-02-28 MED ORDER — SODIUM CHLORIDE 0.9 % IV SOLN: 100.0000 mL | INTRAVENOUS | Status: DC | PRN

## 2017-02-28 MED ORDER — DILTIAZEM HCL 30 MG PO TABS: 30.0000 mg | ORAL_TABLET | Freq: Three times a day (TID) | ORAL | 0 refills | Status: DC

## 2017-02-28 MED ORDER — LIDOCAINE HCL (PF) 1 % IJ SOLN: 5.0000 mL | INTRAMUSCULAR | Status: DC | PRN

## 2017-02-28 MED ORDER — AZITHROMYCIN 500 MG PO TABS: 500.0000 mg | ORAL_TABLET | Freq: Every day | ORAL | 0 refills | Status: AC

## 2017-02-28 NOTE — Progress Notes (Signed)
Pt IVs removed per NT, tolerated well.  Telemetry removed.  Reviewed discharge information with pt and family at bedside.  Answered questions at this time.

## 2017-02-28 NOTE — Consult Note (Signed)
Reason for Consult: End-stage renal disease for hemodialysis Referring Physician: Dr. Bryn Gulling is an 56 y.o. male.  HPI: He is a patient was history of hypertension, end-stage renal disease on maintenance hemodialysis presently came with complaints of dizziness and syncope after dialysis on Thursday. Patient states that he had some cramps on dialysis otherwise felt okay he finish his dialysis without any problem. He drove himself home but after a couple of hours he started feeling dizzy, lightheaded to the point of fainting. Patient then came to the emergency room where he was found to have atrial fibrillation with RVR. Presently he feels much better and offers no complaints.  Past Medical History:  Diagnosis Date  . Alcohol use   . Anemia   . ESRD (end stage renal disease) ([place])    Hemodialysis [facility] in [place]  . Hypertension   . Muscular dystrophy    Limb Girdle  . Pulmonary embolism ([place]) 2011  . Seizures ([place]) 09/2016  . Tobacco abuse   . Vision abnormalities     Past Surgical History:  Procedure Laterality Date  . A/V FISTULAGRAM Left 11/05/2016   Procedure: A/V Fistulagram;  Surgeon: Waynetta Sandy, MD;  Location: [facility];  Service: Cardiovascular;  Laterality: Left;  . AV FISTULA PLACEMENT Left 12/26/2015   Procedure: CREATION OF LEFT RADIAL-CEPHALIC ARTERIOVENOUS FISTULA FOR HEMODIALYSIS;  Surgeon: Vickie Epley, MD;  Location: [facility];  Service: Vascular;  Laterality: Left;  . AV FISTULA PLACEMENT Left 04/28/2016   Procedure: REVISION OF LEFT RADIAL CEPHALIC ARTERIOVENOUS (AV) FISTULA  FOR DURABLE HEMODIALYSIS;  Surgeon: Vickie Epley, MD;  Location: [facility];  Service: Vascular;  Laterality: Left;  . CARDIAC SURGERY    . CENTRAL VENOUS CATHETER INSERTION Right 12/04/2015   Procedure: INSERTION OF TUNNELED HEMODIALYSIS CATHETER RIGHT INTERNAL JUGULAR;  Surgeon: Vickie Epley, MD;  Location: [facility];  Service: General;   Laterality: Right;  . IR REMOVAL TUN CV CATH W/O FL  12/10/2016  . REVISON OF ARTERIOVENOUS FISTULA Left 07/07/2016   Procedure: CREATION OF LEFT ARM BRACHIOCEPHALIC FISTULA;  Surgeon: Waynetta Sandy, MD;  Location: [facility];  Service: Vascular;  Laterality: Left;    Family History  Problem Relation Age of Onset  . Hypertension Mother   . Heart attack Father        d/o MI at 43 yo   . Cancer Neg Hx   . Kidney disease Neg Hx     Social History:  reports that he has been smoking Cigarettes.  He started smoking about 42 years ago. He has a 20.00 pack-year smoking history. He has never used smokeless tobacco. He reports that he does not drink alcohol or use drugs.  Allergies:  Allergies  Allergen Reactions  . Other Other (See Comments)    IV contrast- Renal issues    Medications: I have reviewed the patient's current medications.  Results for orders placed or performed during the hospital encounter of 02/26/17 (from the past 48 hour(s))  CBC with Differential     Status: Abnormal   Collection Time: 02/26/17 12:51 PM  Result Value Ref Range   WBC 13.2 (H) 4.0 - 10.5 K/uL   RBC 4.32 4.22 - 5.81 MIL/uL   Hemoglobin 13.5 13.0 - 17.0 g/dL   HCT 41.4 39.0 - 52.0 %   MCV 95.8 78.0 - 100.0 fL   MCH 31.3 26.0 - 34.0 pg   MCHC 32.6 30.0 - 36.0 g/dL   RDW  13.7 11.5 - 15.5 %   Platelets 163 150 - 400 K/uL   Neutrophils Relative % 83 %   Neutro Abs 11.0 (H) 1.7 - 7.7 K/uL   Lymphocytes Relative 6 %   Lymphs Abs 0.8 0.7 - 4.0 K/uL   Monocytes Relative 11 %   Monocytes Absolute 1.4 (H) 0.1 - 1.0 K/uL   Eosinophils Relative 0 %   Eosinophils Absolute 0.0 0.0 - 0.7 K/uL   Basophils Relative 0 %   Basophils Absolute 0.0 0.0 - 0.1 K/uL  Comprehensive metabolic panel     Status: Abnormal   Collection Time: 02/26/17 12:51 PM  Result Value Ref Range   Sodium 134 (L) 135 - 145 mmol/L   Potassium 4.1 3.5 - 5.1 mmol/L   Chloride 98 (L) 101 - 111 mmol/L   CO2 25 22 - 32 mmol/L    Glucose, Bld 118 (H) 65 - 99 mg/dL   BUN 14 6 - 20 mg/dL   Creatinine, Ser 4.27 (H) 0.61 - 1.24 mg/dL   Calcium 8.7 (L) 8.9 - 10.3 mg/dL   Total Protein 7.2 6.5 - 8.1 g/dL   Albumin 3.3 (L) 3.5 - 5.0 g/dL   AST 13 (L) 15 - 41 U/L   ALT 11 (L) 17 - 63 U/L   Alkaline Phosphatase 79 38 - 126 U/L   Total Bilirubin 1.2 0.3 - 1.2 mg/dL   GFR calc non Af Amer 14 (L) >60 mL/min   GFR calc Af Amer 16 (L) >60 mL/min    Comment: (NOTE) The eGFR has been calculated using the CKD EPI equation. This calculation has not been validated in all clinical situations. eGFR's persistently <60 mL/min signify possible Chronic Kidney Disease.    Anion gap 11 5 - 15  Magnesium     Status: None   Collection Time: 02/26/17 12:51 PM  Result Value Ref Range   Magnesium 2.0 1.7 - 2.4 mg/dL  Troponin I     Status: Abnormal   Collection Time: 02/26/17 12:51 PM  Result Value Ref Range   Troponin I 0.05 (HH) <0.03 ng/mL    Comment: CRITICAL RESULT CALLED TO, READ BACK BY AND VERIFIED WITH: VOGLER,T AT 1550 ON 10.18.2018 BY ISLEY,B   APTT upon arrival     Status: Abnormal   Collection Time: 02/26/17  4:37 PM  Result Value Ref Range   aPTT 57 (H) 24 - 36 seconds    Comment:        IF BASELINE aPTT IS ELEVATED, SUGGEST PATIENT RISK ASSESSMENT BE USED TO DETERMINE APPROPRIATE ANTICOAGULANT THERAPY.   Protime-INR upon arrival     Status: None   Collection Time: 02/26/17  4:37 PM  Result Value Ref Range   Prothrombin Time 14.3 11.4 - 15.2 seconds   INR 1.12   CBC upon arrival     Status: Abnormal   Collection Time: 02/26/17  5:25 PM  Result Value Ref Range   WBC 13.9 (H) 4.0 - 10.5 K/uL   RBC 4.05 (L) 4.22 - 5.81 MIL/uL   Hemoglobin 12.6 (L) 13.0 - 17.0 g/dL   HCT 39.1 39.0 - 52.0 %   MCV 96.5 78.0 - 100.0 fL   MCH 31.1 26.0 - 34.0 pg   MCHC 32.2 30.0 - 36.0 g/dL   RDW 13.8 11.5 - 15.5 %   Platelets 140 (L) 150 - 400 K/uL  TSH     Status: Abnormal   Collection Time: 02/26/17  5:25 PM  Result Value  Ref Range  TSH 0.347 (L) 0.350 - 4.500 uIU/mL    Comment: Performed by a 3rd Generation assay with a functional sensitivity of <=0.01 uIU/mL.  Troponin I     Status: Abnormal   Collection Time: 02/26/17  5:25 PM  Result Value Ref Range   Troponin I 0.06 (HH) <0.03 ng/mL    Comment: CRITICAL VALUE NOTED.  VALUE IS CONSISTENT WITH PREVIOUSLY REPORTED AND CALLED VALUE.  HIV antibody     Status: None   Collection Time: 02/26/17  5:25 PM  Result Value Ref Range   HIV Screen 4th Generation wRfx Non Reactive Non Reactive    Comment: (NOTE) Performed At: [facility] [place], [place] [id number] Lindon Romp MD [id number]   Troponin I     Status: Abnormal   Collection Time: 02/26/17 11:17 PM  Result Value Ref Range   Troponin I 0.06 (HH) <0.03 ng/mL    Comment: CRITICAL VALUE NOTED.  VALUE IS CONSISTENT WITH PREVIOUSLY REPORTED AND CALLED VALUE.  Protime-INR     Status: None   Collection Time: 02/27/17  5:14 AM  Result Value Ref Range   Prothrombin Time 15.0 11.4 - 15.2 seconds   INR 1.19   Heparin level (unfractionated)     Status: Abnormal   Collection Time: 02/27/17  5:14 AM  Result Value Ref Range   Heparin Unfractionated <0.10 (L) 0.30 - 0.70 IU/mL    Comment:        IF HEPARIN RESULTS ARE BELOW EXPECTED VALUES, AND PATIENT DOSAGE HAS BEEN CONFIRMED, SUGGEST FOLLOW UP TESTING OF ANTITHROMBIN III LEVELS.   CBC     Status: Abnormal   Collection Time: 02/27/17  5:14 AM  Result Value Ref Range   WBC 10.2 4.0 - 10.5 K/uL   RBC 3.88 (L) 4.22 - 5.81 MIL/uL   Hemoglobin 11.9 (L) 13.0 - 17.0 g/dL   HCT 37.4 (L) 39.0 - 52.0 %   MCV 96.4 78.0 - 100.0 fL   MCH 30.7 26.0 - 34.0 pg   MCHC 31.8 30.0 - 36.0 g/dL   RDW 13.6 11.5 - 15.5 %   Platelets 142 (L) 150 - 400 K/uL  Troponin I     Status: Abnormal   Collection Time: 02/27/17  5:14 AM  Result Value Ref Range   Troponin I 0.06 (HH) <0.03 ng/mL    Comment: CRITICAL VALUE NOTED.  VALUE IS CONSISTENT  WITH PREVIOUSLY REPORTED AND CALLED VALUE.  T4, free     Status: Abnormal   Collection Time: 02/27/17  5:53 AM  Result Value Ref Range   Free T4 1.25 (H) 0.61 - 1.12 ng/dL    Comment: (NOTE) Biotin ingestion may interfere with free T4 tests. If the results are inconsistent with the TSH level, previous test results, or the clinical presentation, then consider biotin interference. If needed, order repeat testing after stopping biotin. Performed at [facility], [place] [address], [place], [place] [address]   MRSA PCR Screening     Status: None   Collection Time: 02/27/17  3:36 PM  Result Value Ref Range   MRSA by PCR NEGATIVE NEGATIVE    Comment:        The GeneXpert MRSA Assay (FDA approved for NASAL specimens only), is one component of a comprehensive MRSA colonization surveillance program. It is not intended to diagnose MRSA infection nor to guide or monitor treatment for MRSA infections.   Protime-INR     Status: None   Collection Time: 02/28/17  7:11 AM  Result Value Ref Range   Prothrombin Time 15.2 11.4 - 15.2 seconds   INR 1.21   CBC     Status: Abnormal   Collection Time: 02/28/17  7:11 AM  Result Value Ref Range   WBC 7.5 4.0 - 10.5 K/uL   RBC 3.64 (L) 4.22 - 5.81 MIL/uL   Hemoglobin 11.1 (L) 13.0 - 17.0 g/dL   HCT 34.4 (L) 39.0 - 52.0 %   MCV 94.5 78.0 - 100.0 fL   MCH 30.5 26.0 - 34.0 pg   MCHC 32.3 30.0 - 36.0 g/dL   RDW 13.7 11.5 - 15.5 %   Platelets 163 150 - 400 K/uL  Renal function panel     Status: Abnormal   Collection Time: 02/28/17  8:30 AM  Result Value Ref Range   Sodium 137 135 - 145 mmol/L   Potassium 4.3 3.5 - 5.1 mmol/L   Chloride 101 101 - 111 mmol/L   CO2 23 22 - 32 mmol/L   Glucose, Bld 155 (H) 65 - 99 mg/dL   BUN 44 (H) 6 - 20 mg/dL   Creatinine, Ser 8.37 (H) 0.61 - 1.24 mg/dL    Comment: DELTA CHECK NOTED DIALYSIS PT    Calcium 8.4 (L) 8.9 - 10.3 mg/dL   Phosphorus 5.3 (H) 2.5 - 4.6 mg/dL   Albumin 2.8 (L) 3.5 - 5.0 g/dL    GFR calc non Af Amer 6 (L) >60 mL/min   GFR calc Af Amer 7 (L) >60 mL/min    Comment: (NOTE) The eGFR has been calculated using the CKD EPI equation. This calculation has not been validated in all clinical situations. eGFR's persistently <60 mL/min signify possible Chronic Kidney Disease.    Anion gap 13 5 - 15    Dg Chest 2 View  Result Date: 02/26/2017 CLINICAL DATA:  Syncopal episodes today, head dialysis today, coughing EXAM: CHEST  2 VIEW COMPARISON:  Chest x-ray of 05/21/2016 FINDINGS: There is a masslike opacity within the posterior aspect of the right upper lobe. This probably represents pneumonia, with neoplasm less likely in view of the normal chest x-ray in January of 2018. However, follow-up chest x-ray after interval treatment is recommended to exclude underlying malignancy. Otherwise the lungs are clear and somewhat hyperaerated. Mediastinal and hilar contours are unremarkable. The heart is within normal limits in size. No bony abnormality is seen. IMPRESSION: 1. Masslike opacity in the posterior inferior aspect of the right upper lobe. Probable pneumonia but recommend followup to exclude malignancy. 2. Hyperaeration consistent with emphysema . Electronically Signed   By: Ivar Drape M.D.   On: 02/26/2017 13:29   Ct Chest Wo Contrast  Result Date: 02/26/2017 CLINICAL DATA:  Lightheadedness after dialysis. Abnormal chest x-ray. End-stage renal disease. EXAM: CT CHEST WITHOUT CONTRAST TECHNIQUE: Multidetector CT imaging of the chest was performed following the standard protocol without IV contrast. COMPARISON:  Chest CT 04/06/2010 FINDINGS: Cardiovascular: Normal heart size. No pericardial effusion. No notable atherosclerotic calcification. Mediastinum/Nodes: Mild right hilar adenopathy, nonspecific in this setting. Measurement of discrete nodes is limited due to lack of intravenous contrast and intra-abdominal fat. Lungs/Pleura: There is a dense opacity in the posterior segment right  upper lobe with neighboring ground-glass density. Centrally no air bronchograms are seen. This is likely pneumonia in this patient with leukocytosis but need follow-up to exclude mass with superimposed consolidation. No retraction of the adjacent major fissure. Ground-glass opacity with volume loss in the right lower lobe posteriorly, atelectasis versus infection. Generalized airway thickening. Paraseptal and  mild centrilobular emphysematous change. Upper Abdomen: Bilateral renal atrophy. Musculoskeletal: Prominent muscular atrophy. No acute or aggressive finding. 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 The lipoma measures up to 5.1 cm. Asymmetric breast tissue on the right with more lateral nodular density, stable since 2011. IMPRESSION: 1. Masslike opacity in the right upper lobe that again favors pneumonia in this patient with leukocytosis. Followup PA and lateral chest X-ray is recommended in 3-4 weeks following trial of antibiotic therapy to ensure resolution and exclude underlying malignancy. 2. Mild right hilar adenopathy attributed to #1. 3. 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 4. Bronchitic airway thickening and mild emphysema. Electronically Signed   By: Monte Fantasia M.D.   On: 02/26/2017 14:44    Review of Systems  Constitutional: Negative for chills and fever.  Respiratory: Positive for cough. Negative for shortness of breath.   Cardiovascular: Negative for chest pain and palpitations.  Gastrointestinal: Negative for nausea and vomiting.   Blood pressure 139/71, pulse 80, temperature 98.6 F (37 C), temperature source Oral, resp. rate 18, height '6\' 2"'  (1.88 m), weight 65.8 kg (145 lb), SpO2 94 %. Physical Exam  Constitutional: No distress.  Eyes: Left eye exhibits no discharge.  Neck: No JVD present.  Cardiovascular:  No murmur heard. Irregular rate and  rhythm    Assessment/Plan: Problem #1 atrial fibrillation: New. Presently on Cardizem and his heart rate is controlled Problem #3 hypertension: His blood pressure is reasonably controlled Problem #3 end-stage renal disease: He is status post hemodialysis on Thursday. He is due for dialysis today which is his regular schedule. Potassium is normal and he doesn't have any uremic signs and symptoms. Problem #4 history of muscular dystrophy Problem #5 history of seizure disorder Problem #6 history of PE Problem #7 anemia: His hemoglobin is within our target goal. Problem #8 Bone and mineral disorder: His calcium and phosphorus is in range Plan: 1] We will Dialyze himfor 3-1/2 hours 2]We'll use 3K/2.5 calcium bath to remove 1 L. 3]His next dialysis would be on Tuesday which is his regular schedule. If patient is going to be discharged all seen as an outpatient.  Jamesina Gaugh S 02/28/2017, 9:32 AM

## 2017-02-28 NOTE — Discharge Summary (Addendum)
Physician Discharge Summary  Steven Sellers VOJ:[id number] DOB: [birth year] DOA: 02/26/2017  PCP: Sharilyn Sites, MD  Admit date: 02/26/2017 Discharge date: 02/28/2017  Admitted From: home  Disposition:  home  Recommendations for Outpatient Follow-up:  1. Follow up with PCP in 1-2 weeks 2. Continue ESRD at outpatient dialysis center:  Please resume lasix if felt indicated at next visit 3. Outpatient CXR in 3-4 weeks  4. Cardiology follow up in 2 weeks 5. Outpatient ECHO at next visit  Home Health:  none  Equipment/Devices:  None  Discharge Condition:  Stable, improved CODE STATUS:  Full code  Diet recommendation:  renal   Brief/Interim Summary:  Steven Sellers a 56 y.o.malewith history of muscular dystrophy, anemia, hypertension, end-stage renal disease, pulmonary embolism no longer on anticoagulation who presented to the emergency department today secondary to presyncopal episodes, increased cough and chills. He was found to have dehydration, possible CAP, and a-fib with RVR.  He spontaneously converted to NSR in the ER.  ECHO was not performed before discharge.  Can be done as outpatient as patient had ECHO done in May of this year that demonstrated no significant valvular dysfunction and preserved EF.  Cardiology transitioned to oral diltiazem and his heart rate remained stable SR in 80s.  He completed dialysis and was discharged home.    Discharge Diagnoses:  Active Problems:   Atrial fibrillation, new onset ([place])  Presyncope, likely related to underlying pneumonia, a-fib, and mild dehydration after HD. Symptoms improved with IVF - holding lasix until he can follow up with his nephrologist  A-fib with RVR,spontaneously converted to NSR, likely triggered by dehydration and pneumonia.  He has remained in NSR with rate in the 80s on oral diltiazem for 24 hours.   - Troponins:  Mildly elevated but flat - TSH:  Minimally low and fT4 marginally elevated, but doubt  hyperthyroidism - ECHO from May demonstrates EF 55-60% with grade 1 DD, mild MR, trivial TR.  - Assuming repeat ECHO is stable, CHADs2vasc = 1 -  Repeat ECHO as outpatient -  Continue warfarin, will need INR check in 1 week  Minimally elevated troponin,level expected for ESRD, chest pain free - See above  Hypertension, blood pressure low normal - discontinued norvasc and lasix - diltiazem 60mg  po q8h  CAPprobably of both right upper and right lower lobe - Given Ceftriaxone and azithromycin and transitioned to cefpodoxime and azithromycin at discharge - Hold off on steroids for now as on room air and will not change LOS - Repeat CXR in 3-4 weeks  ESRD,had HD today and will likely be discharged before next session - Nephrology assisted wtih HD on 10/20   Muscular dystrophy,does not use cane or walker  Hx of PE, on warfarin for 8 months, then stopped. Incited by chest wall trauma.   Discharge Instructions  Discharge Instructions    Call MD for:  difficulty breathing, headache or visual disturbances    Complete by:  As directed    Call MD for:  persistant dizziness or light-headedness    Complete by:  As directed    Call MD for:  persistant nausea and vomiting    Complete by:  As directed    Call MD for:  temperature >100.4    Complete by:  As directed    Diet - low sodium heart healthy    Complete by:  As directed    Increase activity slowly    Complete by:  As directed  Medication List    STOP taking these medications   amLODipine 10 MG tablet Commonly known as:  NORVASC   furosemide 20 MG tablet Commonly known as:  LASIX     TAKE these medications   acetaminophen 500 MG tablet Commonly known as:  TYLENOL Take 1,000 mg by mouth every 6 (six) hours as needed for mild pain or moderate pain.   azithromycin 500 MG tablet Commonly known as:  ZITHROMAX Take 1 tablet (500 mg total) by mouth daily.   cefpodoxime 200 MG tablet Commonly  known as:  VANTIN Take 1 tablet (200 mg total) by mouth daily.   diltiazem 30 MG tablet Commonly known as:  CARDIZEM Take 1 tablet (30 mg total) by mouth every 8 (eight) hours.   lidocaine-prilocaine cream Commonly known as:  EMLA Apply 1 application topically once.   sevelamer carbonate 0.8 g Pack packet Commonly known as:  RENVELA Take 3.2 g by mouth 3 (three) times daily with meals.   warfarin 2.5 MG tablet Commonly known as:  COUMADIN Take 2 tablets (5 mg total) by mouth daily at 6 PM.   zolpidem 5 MG tablet Commonly known as:  AMBIEN Take 5 mg by mouth at bedtime.      Follow-up Information    Sharilyn Sites, MD Follow up on 03/06/2017.   Specialty:  Family Medicine Why:  10:45 AM for INR check Contact information: [address] [place] [address] [phone]        Satira Sark, MD. Schedule an appointment as soon as possible for a visit in 2 week(s).   Specialty:  Cardiology Contact information: [place] [address] [phone]          Allergies  Allergen Reactions  . Other Other (See Comments)    IV contrast- Renal issues    Consultations: Nephrology    Procedures/Studies: Dg Chest 2 View  Result Date: 02/26/2017 CLINICAL DATA:  Syncopal episodes today, head dialysis today, coughing EXAM: CHEST  2 VIEW COMPARISON:  Chest x-ray of 05/21/2016 FINDINGS: There is a masslike opacity within the posterior aspect of the right upper lobe. This probably represents pneumonia, with neoplasm less likely in view of the normal chest x-ray in January of 2018. However, follow-up chest x-ray after interval treatment is recommended to exclude underlying malignancy. Otherwise the lungs are clear and somewhat hyperaerated. Mediastinal and hilar contours are unremarkable. The heart is within normal limits in size. No bony abnormality is seen. IMPRESSION: 1. Masslike opacity in the posterior inferior aspect of the right upper lobe.  Probable pneumonia but recommend followup to exclude malignancy. 2. Hyperaeration consistent with emphysema . Electronically Signed   By: Ivar Drape M.D.   On: 02/26/2017 13:29   Ct Chest Wo Contrast  Result Date: 02/26/2017 CLINICAL DATA:  Lightheadedness after dialysis. Abnormal chest x-ray. End-stage renal disease. EXAM: CT CHEST WITHOUT CONTRAST TECHNIQUE: Multidetector CT imaging of the chest was performed following the standard protocol without IV contrast. COMPARISON:  Chest CT 04/06/2010 FINDINGS: Cardiovascular: Normal heart size. No pericardial effusion. No notable atherosclerotic calcification. Mediastinum/Nodes: Mild right hilar adenopathy, nonspecific in this setting. Measurement of discrete nodes is limited due to lack of intravenous contrast and intra-abdominal fat. Lungs/Pleura: There is a dense opacity in the posterior segment right upper lobe with neighboring ground-glass density. Centrally no air bronchograms are seen. This is likely pneumonia in this patient with leukocytosis but need follow-up to exclude mass with superimposed consolidation. No retraction of the adjacent major fissure.  Ground-glass opacity with volume loss in the right lower lobe posteriorly, atelectasis versus infection. Generalized airway thickening. Paraseptal and mild centrilobular emphysematous change. Upper Abdomen: Bilateral renal atrophy. Musculoskeletal: Prominent muscular atrophy. No acute or aggressive finding. 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 The lipoma measures up to 5.1 cm. Asymmetric breast tissue on the right with more lateral nodular density, stable since 2011. IMPRESSION: 1. Masslike opacity in the right upper lobe that again favors pneumonia in this patient with leukocytosis. Followup PA and lateral chest X-ray is recommended in 3-4 weeks following trial of antibiotic therapy to ensure resolution and exclude underlying  malignancy. 2. Mild right hilar adenopathy attributed to #1. 3. 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 4. Bronchitic airway thickening and mild emphysema. Electronically Signed   By: Monte Fantasia M.D.   On: 02/26/2017 14:44    Subjective: Feeling better.  Cough has improved, no longer SOB.  Denies palpitations, chest pains.  Ready to go home   Discharge Exam: Vitals:   02/28/17 1200 02/28/17 1230  BP: (!) 154/82 (!) 153/85  Pulse: 92 89  Resp:    Temp:    SpO2:     Vitals:   02/28/17 1100 02/28/17 1130 02/28/17 1200 02/28/17 1230  BP: (!) 152/90 (!) 155/90 (!) 154/82 (!) 153/85  Pulse: 79 81 92 89  Resp:      Temp:      TempSrc:      SpO2:      Weight:      Height:        General: Pt is alert, awake, not in acute distress, receiving HD in his room Cardiovascular: RRR, S1/S2 +, no rubs, no gallops Respiratory: CTA bilaterally, no wheezing, no rhonchi Abdominal: Soft, NT, ND, bowel sounds + Extremities: no edema, no cyanosis   The results of significant diagnostics from this hospitalization (including imaging, microbiology, ancillary and laboratory) are listed below for reference.     Microbiology: Recent Results (from the past 240 hour(s))  MRSA PCR Screening     Status: None   Collection Time: 02/27/17  3:36 PM  Result Value Ref Range Status   MRSA by PCR NEGATIVE NEGATIVE Final    Comment:        The GeneXpert MRSA Assay (FDA approved for NASAL specimens only), is one component of a comprehensive MRSA colonization surveillance program. It is not intended to diagnose MRSA infection nor to guide or monitor treatment for MRSA infections.      Labs: BNP (last 3 results) No results for input(s): BNP in the last 8760 hours. Basic Metabolic Panel:  Recent Labs Lab 02/26/17 1251 02/28/17 0830  NA 134* 137  K 4.1 4.3  CL 98* 101  CO2 25 23  GLUCOSE 118* 155*  BUN 14 44*  CREATININE 4.27* 8.37*   CALCIUM 8.7* 8.4*  MG 2.0  --   PHOS  --  5.3*   Liver Function Tests:  Recent Labs Lab 02/26/17 1251 02/28/17 0830  AST 13*  --   ALT 11*  --   ALKPHOS 79  --   BILITOT 1.2  --   PROT 7.2  --   ALBUMIN 3.3* 2.8*   No results for input(s): LIPASE, AMYLASE in the last 168 hours. No results for input(s): AMMONIA in the last 168 hours. CBC:  Recent Labs Lab 02/26/17 1251 02/26/17 1725 02/27/17 6811  02/28/17 0711  WBC 13.2* 13.9* 10.2 7.5  NEUTROABS 11.0*  --   --   --   HGB 13.5 12.6* 11.9* 11.1*  HCT 41.4 39.1 37.4* 34.4*  MCV 95.8 96.5 96.4 94.5  PLT 163 140* 142* 163   Cardiac Enzymes:  Recent Labs Lab 02/26/17 1251 02/26/17 1725 02/26/17 2317 02/27/17 0514  TROPONINI 0.05* 0.06* 0.06* 0.06*   BNP: Invalid input(s): POCBNP CBG: No results for input(s): GLUCAP in the last 168 hours. D-Dimer No results for input(s): DDIMER in the last 72 hours. Hgb A1c No results for input(s): HGBA1C in the last 72 hours. Lipid Profile No results for input(s): CHOL, HDL, LDLCALC, TRIG, CHOLHDL, LDLDIRECT in the last 72 hours. Thyroid function studies  Recent Labs  02/26/17 1725  TSH 0.347*   Anemia work up No results for input(s): VITAMINB12, FOLATE, FERRITIN, TIBC, IRON, RETICCTPCT in the last 72 hours. Urinalysis    Component Value Date/Time   COLORURINE YELLOW 05/21/2016 [place] 05/21/2016 0941   LABSPEC 1.020 05/21/2016 0941   PHURINE 8.0 05/21/2016 0941   GLUCOSEU NEGATIVE 05/21/2016 0941   HGBUR LARGE (A) 05/21/2016 0941   BILIRUBINUR NEGATIVE 05/21/2016 0941   KETONESUR NEGATIVE 05/21/2016 0941   PROTEINUR >300 (A) 05/21/2016 0941   UROBILINOGEN 0.2 04/11/2010 2336   NITRITE NEGATIVE 05/21/2016 0941   LEUKOCYTESUR NEGATIVE 05/21/2016 0941   Sepsis Labs Invalid input(s): PROCALCITONIN,  WBC,  LACTICIDVEN   Time coordinating discharge: Over 30 minutes  SIGNED:   Janece Canterbury, MD  [facility] 02/28/2017, 1:06  PM Pager   If 7PM-7AM, please contact night-coverage [email or web address] Password [email or web address]

## 2017-02-28 NOTE — Progress Notes (Signed)
ANTICOAGULATION CONSULT NOTE - Follow Up Consult  Pharmacy Consult for Coumadin Indication: atrial fibrillation  Allergies  Allergen Reactions  . Other Other (See Comments)    IV contrast- Renal issues    Patient Measurements: Height: 6\' 2"  (188 cm) Weight: 145 lb (65.8 kg) IBW/kg (Calculated) : 82.2   Vital Signs: Temp: 98.6 F (37 C) (10/20 0515) Temp Source: Oral (10/20 0515) BP: 139/71 (10/20 0515) Pulse Rate: 80 (10/20 0515)  Labs:  Recent Labs  02/26/17 1251 02/26/17 1637 02/26/17 1725 02/26/17 2317 02/27/17 0514 02/28/17 0711 02/28/17 0830  HGB 13.5  --  12.6*  --  11.9* 11.1*  --   HCT 41.4  --  39.1  --  37.4* 34.4*  --   PLT 163  --  140*  --  142* 163  --   APTT  --  57*  --   --   --   --   --   LABPROT  --  14.3  --   --  15.0 15.2  --   INR  --  1.12  --   --  1.19 1.21  --   HEPARINUNFRC  --   --   --   --  <0.10*  --   --   CREATININE 4.27*  --   --   --   --   --  8.37*  TROPONINI 0.05*  --  0.06* 0.06* 0.06*  --   --     Estimated Creatinine Clearance: 9.1 mL/min (A) (by C-G formula based on SCr of 8.37 mg/dL (H)).   Medications:  Scheduled:  . azithromycin  500 mg Oral Q24H  . coumadin book   Does not apply Once  . diltiazem  30 mg Oral Q8H  . heparin subcutaneous  5,000 Units Subcutaneous Q8H  . sevelamer carbonate  3.2 g Oral TID WC  . sodium chloride flush  3 mL Intravenous Q12H  . warfarin   Does not apply Once  . Warfarin - Pharmacist Dosing Inpatient   Does not apply Q24H  . zolpidem  5 mg Oral QHS    Assessment: 56yo male with afib.  Pt has been doing dialysis x 2 years.  C/O light headed. Heparin discontinued yesterday, INR below goal this AM  Goal of Therapy:  INR 2-3 Monitor platelets by anticoagulation protocol: Yes   Plan:  Coumadin 5 mg po x 1 dose today INR/CBC daily  Yomar Mejorado Bennett 02/28/2017,9:23 AM

## 2017-03-01 LAB — HEPATITIS B SURFACE ANTIGEN: Hepatitis B Surface Ag: NEGATIVE

## 2017-03-25 ENCOUNTER — Ambulatory Visit (HOSPITAL_COMMUNITY)
Admission: RE | Admit: 2017-03-25 | Discharge: 2017-03-25 | Disposition: A | Discharge status: Home / Self Care | Payer: BLUE CROSS/BLUE SHIELD | Source: Ambulatory Visit | Attending: Family Medicine | Admitting: Family Medicine

## 2017-03-25 ENCOUNTER — Other Ambulatory Visit (HOSPITAL_COMMUNITY): Payer: Self-pay | Admitting: Family Medicine

## 2017-03-25 DIAGNOSIS — I5089 Other heart failure: Secondary | ICD-10-CM | POA: Insufficient documentation

## 2017-03-25 DIAGNOSIS — J449 Chronic obstructive pulmonary disease, unspecified: Secondary | ICD-10-CM | POA: Insufficient documentation

## 2017-03-25 DIAGNOSIS — R918 Other nonspecific abnormal finding of lung field: Secondary | ICD-10-CM | POA: Diagnosis not present

## 2017-03-25 DIAGNOSIS — I700 Atherosclerosis of aorta: Secondary | ICD-10-CM | POA: Diagnosis not present

## 2017-10-10 DIAGNOSIS — Z992 Dependence on renal dialysis: Secondary | ICD-10-CM | POA: Diagnosis not present

## 2017-10-10 DIAGNOSIS — D509 Iron deficiency anemia, unspecified: Secondary | ICD-10-CM | POA: Diagnosis not present

## 2017-10-10 DIAGNOSIS — N2581 Secondary hyperparathyroidism of renal origin: Secondary | ICD-10-CM | POA: Diagnosis not present

## 2017-10-10 DIAGNOSIS — N186 End stage renal disease: Secondary | ICD-10-CM | POA: Diagnosis not present

## 2017-10-13 DIAGNOSIS — Z992 Dependence on renal dialysis: Secondary | ICD-10-CM | POA: Diagnosis not present

## 2017-10-13 DIAGNOSIS — N186 End stage renal disease: Secondary | ICD-10-CM | POA: Diagnosis not present

## 2017-10-13 DIAGNOSIS — D509 Iron deficiency anemia, unspecified: Secondary | ICD-10-CM | POA: Diagnosis not present

## 2017-10-13 DIAGNOSIS — N2581 Secondary hyperparathyroidism of renal origin: Secondary | ICD-10-CM | POA: Diagnosis not present

## 2017-10-15 DIAGNOSIS — Z992 Dependence on renal dialysis: Secondary | ICD-10-CM | POA: Diagnosis not present

## 2017-10-15 DIAGNOSIS — N2581 Secondary hyperparathyroidism of renal origin: Secondary | ICD-10-CM | POA: Diagnosis not present

## 2017-10-15 DIAGNOSIS — N186 End stage renal disease: Secondary | ICD-10-CM | POA: Diagnosis not present

## 2017-10-15 DIAGNOSIS — D509 Iron deficiency anemia, unspecified: Secondary | ICD-10-CM | POA: Diagnosis not present

## 2017-10-17 DIAGNOSIS — Z992 Dependence on renal dialysis: Secondary | ICD-10-CM | POA: Diagnosis not present

## 2017-10-17 DIAGNOSIS — D509 Iron deficiency anemia, unspecified: Secondary | ICD-10-CM | POA: Diagnosis not present

## 2017-10-17 DIAGNOSIS — N2581 Secondary hyperparathyroidism of renal origin: Secondary | ICD-10-CM | POA: Diagnosis not present

## 2017-10-17 DIAGNOSIS — N186 End stage renal disease: Secondary | ICD-10-CM | POA: Diagnosis not present

## 2017-10-20 DIAGNOSIS — Z992 Dependence on renal dialysis: Secondary | ICD-10-CM | POA: Diagnosis not present

## 2017-10-20 DIAGNOSIS — N2581 Secondary hyperparathyroidism of renal origin: Secondary | ICD-10-CM | POA: Diagnosis not present

## 2017-10-20 DIAGNOSIS — N186 End stage renal disease: Secondary | ICD-10-CM | POA: Diagnosis not present

## 2017-10-20 DIAGNOSIS — D509 Iron deficiency anemia, unspecified: Secondary | ICD-10-CM | POA: Diagnosis not present

## 2017-10-22 DIAGNOSIS — N2581 Secondary hyperparathyroidism of renal origin: Secondary | ICD-10-CM | POA: Diagnosis not present

## 2017-10-22 DIAGNOSIS — Z992 Dependence on renal dialysis: Secondary | ICD-10-CM | POA: Diagnosis not present

## 2017-10-22 DIAGNOSIS — N186 End stage renal disease: Secondary | ICD-10-CM | POA: Diagnosis not present

## 2017-10-22 DIAGNOSIS — D509 Iron deficiency anemia, unspecified: Secondary | ICD-10-CM | POA: Diagnosis not present

## 2017-10-24 DIAGNOSIS — N2581 Secondary hyperparathyroidism of renal origin: Secondary | ICD-10-CM | POA: Diagnosis not present

## 2017-10-24 DIAGNOSIS — D509 Iron deficiency anemia, unspecified: Secondary | ICD-10-CM | POA: Diagnosis not present

## 2017-10-24 DIAGNOSIS — Z992 Dependence on renal dialysis: Secondary | ICD-10-CM | POA: Diagnosis not present

## 2017-10-24 DIAGNOSIS — N186 End stage renal disease: Secondary | ICD-10-CM | POA: Diagnosis not present

## 2017-10-27 DIAGNOSIS — N186 End stage renal disease: Secondary | ICD-10-CM | POA: Diagnosis not present

## 2017-10-27 DIAGNOSIS — N2581 Secondary hyperparathyroidism of renal origin: Secondary | ICD-10-CM | POA: Diagnosis not present

## 2017-10-27 DIAGNOSIS — D509 Iron deficiency anemia, unspecified: Secondary | ICD-10-CM | POA: Diagnosis not present

## 2017-10-27 DIAGNOSIS — Z992 Dependence on renal dialysis: Secondary | ICD-10-CM | POA: Diagnosis not present

## 2017-10-29 DIAGNOSIS — N2581 Secondary hyperparathyroidism of renal origin: Secondary | ICD-10-CM | POA: Diagnosis not present

## 2017-10-29 DIAGNOSIS — Z992 Dependence on renal dialysis: Secondary | ICD-10-CM | POA: Diagnosis not present

## 2017-10-29 DIAGNOSIS — D509 Iron deficiency anemia, unspecified: Secondary | ICD-10-CM | POA: Diagnosis not present

## 2017-10-29 DIAGNOSIS — N186 End stage renal disease: Secondary | ICD-10-CM | POA: Diagnosis not present

## 2017-10-31 DIAGNOSIS — N186 End stage renal disease: Secondary | ICD-10-CM | POA: Diagnosis not present

## 2017-10-31 DIAGNOSIS — N2581 Secondary hyperparathyroidism of renal origin: Secondary | ICD-10-CM | POA: Diagnosis not present

## 2017-10-31 DIAGNOSIS — D509 Iron deficiency anemia, unspecified: Secondary | ICD-10-CM | POA: Diagnosis not present

## 2017-10-31 DIAGNOSIS — Z992 Dependence on renal dialysis: Secondary | ICD-10-CM | POA: Diagnosis not present

## 2017-11-03 DIAGNOSIS — N186 End stage renal disease: Secondary | ICD-10-CM | POA: Diagnosis not present

## 2017-11-03 DIAGNOSIS — N2581 Secondary hyperparathyroidism of renal origin: Secondary | ICD-10-CM | POA: Diagnosis not present

## 2017-11-03 DIAGNOSIS — D509 Iron deficiency anemia, unspecified: Secondary | ICD-10-CM | POA: Diagnosis not present

## 2017-11-03 DIAGNOSIS — Z992 Dependence on renal dialysis: Secondary | ICD-10-CM | POA: Diagnosis not present

## 2017-11-05 DIAGNOSIS — D509 Iron deficiency anemia, unspecified: Secondary | ICD-10-CM | POA: Diagnosis not present

## 2017-11-05 DIAGNOSIS — N2581 Secondary hyperparathyroidism of renal origin: Secondary | ICD-10-CM | POA: Diagnosis not present

## 2017-11-05 DIAGNOSIS — Z992 Dependence on renal dialysis: Secondary | ICD-10-CM | POA: Diagnosis not present

## 2017-11-05 DIAGNOSIS — N186 End stage renal disease: Secondary | ICD-10-CM | POA: Diagnosis not present

## 2017-11-07 DIAGNOSIS — N186 End stage renal disease: Secondary | ICD-10-CM | POA: Diagnosis not present

## 2017-11-07 DIAGNOSIS — N2581 Secondary hyperparathyroidism of renal origin: Secondary | ICD-10-CM | POA: Diagnosis not present

## 2017-11-07 DIAGNOSIS — Z992 Dependence on renal dialysis: Secondary | ICD-10-CM | POA: Diagnosis not present

## 2017-11-07 DIAGNOSIS — D509 Iron deficiency anemia, unspecified: Secondary | ICD-10-CM | POA: Diagnosis not present

## 2017-11-10 DIAGNOSIS — Z992 Dependence on renal dialysis: Secondary | ICD-10-CM | POA: Diagnosis not present

## 2017-11-10 DIAGNOSIS — N186 End stage renal disease: Secondary | ICD-10-CM | POA: Diagnosis not present

## 2017-11-10 DIAGNOSIS — N2581 Secondary hyperparathyroidism of renal origin: Secondary | ICD-10-CM | POA: Diagnosis not present

## 2017-11-10 DIAGNOSIS — D509 Iron deficiency anemia, unspecified: Secondary | ICD-10-CM | POA: Diagnosis not present

## 2017-11-12 DIAGNOSIS — Z992 Dependence on renal dialysis: Secondary | ICD-10-CM | POA: Diagnosis not present

## 2017-11-12 DIAGNOSIS — D509 Iron deficiency anemia, unspecified: Secondary | ICD-10-CM | POA: Diagnosis not present

## 2017-11-12 DIAGNOSIS — N186 End stage renal disease: Secondary | ICD-10-CM | POA: Diagnosis not present

## 2017-11-12 DIAGNOSIS — N2581 Secondary hyperparathyroidism of renal origin: Secondary | ICD-10-CM | POA: Diagnosis not present

## 2017-11-14 DIAGNOSIS — Z992 Dependence on renal dialysis: Secondary | ICD-10-CM | POA: Diagnosis not present

## 2017-11-14 DIAGNOSIS — D509 Iron deficiency anemia, unspecified: Secondary | ICD-10-CM | POA: Diagnosis not present

## 2017-11-14 DIAGNOSIS — N2581 Secondary hyperparathyroidism of renal origin: Secondary | ICD-10-CM | POA: Diagnosis not present

## 2017-11-14 DIAGNOSIS — N186 End stage renal disease: Secondary | ICD-10-CM | POA: Diagnosis not present

## 2017-11-17 DIAGNOSIS — Z992 Dependence on renal dialysis: Secondary | ICD-10-CM | POA: Diagnosis not present

## 2017-11-17 DIAGNOSIS — N186 End stage renal disease: Secondary | ICD-10-CM | POA: Diagnosis not present

## 2017-11-17 DIAGNOSIS — N2581 Secondary hyperparathyroidism of renal origin: Secondary | ICD-10-CM | POA: Diagnosis not present

## 2017-11-17 DIAGNOSIS — D509 Iron deficiency anemia, unspecified: Secondary | ICD-10-CM | POA: Diagnosis not present

## 2017-11-19 DIAGNOSIS — N186 End stage renal disease: Secondary | ICD-10-CM | POA: Diagnosis not present

## 2017-11-19 DIAGNOSIS — D509 Iron deficiency anemia, unspecified: Secondary | ICD-10-CM | POA: Diagnosis not present

## 2017-11-19 DIAGNOSIS — Z992 Dependence on renal dialysis: Secondary | ICD-10-CM | POA: Diagnosis not present

## 2017-11-19 DIAGNOSIS — N2581 Secondary hyperparathyroidism of renal origin: Secondary | ICD-10-CM | POA: Diagnosis not present

## 2017-11-21 DIAGNOSIS — N2581 Secondary hyperparathyroidism of renal origin: Secondary | ICD-10-CM | POA: Diagnosis not present

## 2017-11-21 DIAGNOSIS — Z992 Dependence on renal dialysis: Secondary | ICD-10-CM | POA: Diagnosis not present

## 2017-11-21 DIAGNOSIS — D509 Iron deficiency anemia, unspecified: Secondary | ICD-10-CM | POA: Diagnosis not present

## 2017-11-21 DIAGNOSIS — N186 End stage renal disease: Secondary | ICD-10-CM | POA: Diagnosis not present

## 2017-11-24 DIAGNOSIS — N186 End stage renal disease: Secondary | ICD-10-CM | POA: Diagnosis not present

## 2017-11-24 DIAGNOSIS — Z992 Dependence on renal dialysis: Secondary | ICD-10-CM | POA: Diagnosis not present

## 2017-11-24 DIAGNOSIS — N2581 Secondary hyperparathyroidism of renal origin: Secondary | ICD-10-CM | POA: Diagnosis not present

## 2017-11-24 DIAGNOSIS — D509 Iron deficiency anemia, unspecified: Secondary | ICD-10-CM | POA: Diagnosis not present

## 2017-11-26 DIAGNOSIS — N2581 Secondary hyperparathyroidism of renal origin: Secondary | ICD-10-CM | POA: Diagnosis not present

## 2017-11-26 DIAGNOSIS — N186 End stage renal disease: Secondary | ICD-10-CM | POA: Diagnosis not present

## 2017-11-26 DIAGNOSIS — Z992 Dependence on renal dialysis: Secondary | ICD-10-CM | POA: Diagnosis not present

## 2017-11-26 DIAGNOSIS — D509 Iron deficiency anemia, unspecified: Secondary | ICD-10-CM | POA: Diagnosis not present

## 2017-11-28 DIAGNOSIS — Z992 Dependence on renal dialysis: Secondary | ICD-10-CM | POA: Diagnosis not present

## 2017-11-28 DIAGNOSIS — N2581 Secondary hyperparathyroidism of renal origin: Secondary | ICD-10-CM | POA: Diagnosis not present

## 2017-11-28 DIAGNOSIS — N186 End stage renal disease: Secondary | ICD-10-CM | POA: Diagnosis not present

## 2017-11-28 DIAGNOSIS — D509 Iron deficiency anemia, unspecified: Secondary | ICD-10-CM | POA: Diagnosis not present

## 2017-12-01 DIAGNOSIS — D509 Iron deficiency anemia, unspecified: Secondary | ICD-10-CM | POA: Diagnosis not present

## 2017-12-01 DIAGNOSIS — N2581 Secondary hyperparathyroidism of renal origin: Secondary | ICD-10-CM | POA: Diagnosis not present

## 2017-12-01 DIAGNOSIS — N186 End stage renal disease: Secondary | ICD-10-CM | POA: Diagnosis not present

## 2017-12-01 DIAGNOSIS — Z992 Dependence on renal dialysis: Secondary | ICD-10-CM | POA: Diagnosis not present

## 2017-12-03 DIAGNOSIS — Z992 Dependence on renal dialysis: Secondary | ICD-10-CM | POA: Diagnosis not present

## 2017-12-03 DIAGNOSIS — N2581 Secondary hyperparathyroidism of renal origin: Secondary | ICD-10-CM | POA: Diagnosis not present

## 2017-12-03 DIAGNOSIS — N186 End stage renal disease: Secondary | ICD-10-CM | POA: Diagnosis not present

## 2017-12-03 DIAGNOSIS — D509 Iron deficiency anemia, unspecified: Secondary | ICD-10-CM | POA: Diagnosis not present

## 2017-12-05 DIAGNOSIS — D509 Iron deficiency anemia, unspecified: Secondary | ICD-10-CM | POA: Diagnosis not present

## 2017-12-05 DIAGNOSIS — Z992 Dependence on renal dialysis: Secondary | ICD-10-CM | POA: Diagnosis not present

## 2017-12-05 DIAGNOSIS — N186 End stage renal disease: Secondary | ICD-10-CM | POA: Diagnosis not present

## 2017-12-05 DIAGNOSIS — N2581 Secondary hyperparathyroidism of renal origin: Secondary | ICD-10-CM | POA: Diagnosis not present

## 2017-12-08 DIAGNOSIS — N186 End stage renal disease: Secondary | ICD-10-CM | POA: Diagnosis not present

## 2017-12-08 DIAGNOSIS — N2581 Secondary hyperparathyroidism of renal origin: Secondary | ICD-10-CM | POA: Diagnosis not present

## 2017-12-08 DIAGNOSIS — Z992 Dependence on renal dialysis: Secondary | ICD-10-CM | POA: Diagnosis not present

## 2017-12-08 DIAGNOSIS — D509 Iron deficiency anemia, unspecified: Secondary | ICD-10-CM | POA: Diagnosis not present

## 2017-12-10 DIAGNOSIS — N186 End stage renal disease: Secondary | ICD-10-CM | POA: Diagnosis not present

## 2017-12-10 DIAGNOSIS — N2581 Secondary hyperparathyroidism of renal origin: Secondary | ICD-10-CM | POA: Diagnosis not present

## 2017-12-10 DIAGNOSIS — D509 Iron deficiency anemia, unspecified: Secondary | ICD-10-CM | POA: Diagnosis not present

## 2017-12-10 DIAGNOSIS — Z992 Dependence on renal dialysis: Secondary | ICD-10-CM | POA: Diagnosis not present

## 2017-12-12 DIAGNOSIS — Z992 Dependence on renal dialysis: Secondary | ICD-10-CM | POA: Diagnosis not present

## 2017-12-12 DIAGNOSIS — N186 End stage renal disease: Secondary | ICD-10-CM | POA: Diagnosis not present

## 2017-12-12 DIAGNOSIS — D509 Iron deficiency anemia, unspecified: Secondary | ICD-10-CM | POA: Diagnosis not present

## 2017-12-12 DIAGNOSIS — N2581 Secondary hyperparathyroidism of renal origin: Secondary | ICD-10-CM | POA: Diagnosis not present

## 2017-12-15 DIAGNOSIS — Z992 Dependence on renal dialysis: Secondary | ICD-10-CM | POA: Diagnosis not present

## 2017-12-15 DIAGNOSIS — N186 End stage renal disease: Secondary | ICD-10-CM | POA: Diagnosis not present

## 2017-12-15 DIAGNOSIS — D509 Iron deficiency anemia, unspecified: Secondary | ICD-10-CM | POA: Diagnosis not present

## 2017-12-15 DIAGNOSIS — N2581 Secondary hyperparathyroidism of renal origin: Secondary | ICD-10-CM | POA: Diagnosis not present

## 2017-12-17 DIAGNOSIS — N186 End stage renal disease: Secondary | ICD-10-CM | POA: Diagnosis not present

## 2017-12-17 DIAGNOSIS — Z992 Dependence on renal dialysis: Secondary | ICD-10-CM | POA: Diagnosis not present

## 2017-12-17 DIAGNOSIS — D509 Iron deficiency anemia, unspecified: Secondary | ICD-10-CM | POA: Diagnosis not present

## 2017-12-17 DIAGNOSIS — N2581 Secondary hyperparathyroidism of renal origin: Secondary | ICD-10-CM | POA: Diagnosis not present

## 2017-12-19 DIAGNOSIS — D509 Iron deficiency anemia, unspecified: Secondary | ICD-10-CM | POA: Diagnosis not present

## 2017-12-19 DIAGNOSIS — Z992 Dependence on renal dialysis: Secondary | ICD-10-CM | POA: Diagnosis not present

## 2017-12-19 DIAGNOSIS — N186 End stage renal disease: Secondary | ICD-10-CM | POA: Diagnosis not present

## 2017-12-19 DIAGNOSIS — N2581 Secondary hyperparathyroidism of renal origin: Secondary | ICD-10-CM | POA: Diagnosis not present

## 2017-12-22 DIAGNOSIS — D509 Iron deficiency anemia, unspecified: Secondary | ICD-10-CM | POA: Diagnosis not present

## 2017-12-22 DIAGNOSIS — N186 End stage renal disease: Secondary | ICD-10-CM | POA: Diagnosis not present

## 2017-12-22 DIAGNOSIS — N2581 Secondary hyperparathyroidism of renal origin: Secondary | ICD-10-CM | POA: Diagnosis not present

## 2017-12-22 DIAGNOSIS — Z992 Dependence on renal dialysis: Secondary | ICD-10-CM | POA: Diagnosis not present

## 2017-12-24 DIAGNOSIS — N2581 Secondary hyperparathyroidism of renal origin: Secondary | ICD-10-CM | POA: Diagnosis not present

## 2017-12-24 DIAGNOSIS — Z992 Dependence on renal dialysis: Secondary | ICD-10-CM | POA: Diagnosis not present

## 2017-12-24 DIAGNOSIS — D509 Iron deficiency anemia, unspecified: Secondary | ICD-10-CM | POA: Diagnosis not present

## 2017-12-24 DIAGNOSIS — N186 End stage renal disease: Secondary | ICD-10-CM | POA: Diagnosis not present

## 2017-12-26 DIAGNOSIS — D509 Iron deficiency anemia, unspecified: Secondary | ICD-10-CM | POA: Diagnosis not present

## 2017-12-26 DIAGNOSIS — N186 End stage renal disease: Secondary | ICD-10-CM | POA: Diagnosis not present

## 2017-12-26 DIAGNOSIS — Z992 Dependence on renal dialysis: Secondary | ICD-10-CM | POA: Diagnosis not present

## 2017-12-26 DIAGNOSIS — N2581 Secondary hyperparathyroidism of renal origin: Secondary | ICD-10-CM | POA: Diagnosis not present

## 2017-12-29 DIAGNOSIS — Z992 Dependence on renal dialysis: Secondary | ICD-10-CM | POA: Diagnosis not present

## 2017-12-29 DIAGNOSIS — D509 Iron deficiency anemia, unspecified: Secondary | ICD-10-CM | POA: Diagnosis not present

## 2017-12-29 DIAGNOSIS — N186 End stage renal disease: Secondary | ICD-10-CM | POA: Diagnosis not present

## 2017-12-29 DIAGNOSIS — N2581 Secondary hyperparathyroidism of renal origin: Secondary | ICD-10-CM | POA: Diagnosis not present

## 2017-12-31 DIAGNOSIS — N186 End stage renal disease: Secondary | ICD-10-CM | POA: Diagnosis not present

## 2017-12-31 DIAGNOSIS — N2581 Secondary hyperparathyroidism of renal origin: Secondary | ICD-10-CM | POA: Diagnosis not present

## 2017-12-31 DIAGNOSIS — Z992 Dependence on renal dialysis: Secondary | ICD-10-CM | POA: Diagnosis not present

## 2017-12-31 DIAGNOSIS — D509 Iron deficiency anemia, unspecified: Secondary | ICD-10-CM | POA: Diagnosis not present

## 2018-01-01 ENCOUNTER — Encounter: Payer: Self-pay | Admitting: *Deleted

## 2018-01-01 ENCOUNTER — Ambulatory Visit (INDEPENDENT_AMBULATORY_CARE_PROVIDER_SITE_OTHER): Payer: Medicare Other | Admitting: Physician Assistant

## 2018-01-01 ENCOUNTER — Other Ambulatory Visit: Payer: Self-pay | Admitting: *Deleted

## 2018-01-01 ENCOUNTER — Other Ambulatory Visit: Payer: Self-pay

## 2018-01-01 VITALS — BP 145/90 | HR 72 | Resp 20 | Ht 74.0 in | Wt 147.0 lb

## 2018-01-01 DIAGNOSIS — Z992 Dependence on renal dialysis: Secondary | ICD-10-CM | POA: Diagnosis not present

## 2018-01-01 DIAGNOSIS — N186 End stage renal disease: Secondary | ICD-10-CM

## 2018-01-01 NOTE — Progress Notes (Signed)
History of Present Illness:  Patient is a 57 y.o. year old male who presents for evaluation of his fistula.  There is a reported "whistling" sound in the fistula, and flow malfunctioning.   They are only able to use the distal 1/2 of the fistula, the upper 1/2 is hard to stick.  The brachiocephalic AV fistula was difficult to cannulated.  He was scheduled for a fistulagram 11/05/2016 with  .Findings: The fistula and central veins are patent throughout her course. Retrograde angiogram demonstrates flow to the arterial system although this wasn't completely evaluated. There was no intervention undertaken.  Past medical history: HTN, 40 year smoker, ESRD on HD T-TH-S, A fib, and hx of PE.  He is not currently on Coumadin.     Past Medical History:  Diagnosis Date  . Alcohol use   . Anemia   . ESRD (end stage renal disease) ([place])    Hemodialysis TTHS- [facility] in [place]  . Hypertension   . Muscular dystrophy    Limb Girdle  . Pulmonary embolism ([place]) 2011  . Seizures ([place]) 09/2016  . Tobacco abuse   . Vision abnormalities     Past Surgical History:  Procedure Laterality Date  . A/V FISTULAGRAM Left 11/05/2016   Procedure: A/V Fistulagram;  Surgeon: Waynetta Sandy, MD;  Location: [facility];  Service: Cardiovascular;  Laterality: Left;  . AV FISTULA PLACEMENT Left 12/26/2015   Procedure: CREATION OF LEFT RADIAL-CEPHALIC ARTERIOVENOUS FISTULA FOR HEMODIALYSIS;  Surgeon: Vickie Epley, MD;  Location: [facility];  Service: Vascular;  Laterality: Left;  . AV FISTULA PLACEMENT Left 04/28/2016   Procedure: REVISION OF LEFT RADIAL CEPHALIC ARTERIOVENOUS (AV) FISTULA  FOR DURABLE HEMODIALYSIS;  Surgeon: Vickie Epley, MD;  Location: [facility];  Service: Vascular;  Laterality: Left;  . CARDIAC SURGERY    . CENTRAL VENOUS CATHETER INSERTION Right 12/04/2015   Procedure: INSERTION OF TUNNELED HEMODIALYSIS CATHETER RIGHT INTERNAL JUGULAR;  Surgeon: Vickie Epley, MD;   Location: [facility];  Service: General;  Laterality: Right;  . IR REMOVAL TUN CV CATH W/O FL  12/10/2016  . REVISON OF ARTERIOVENOUS FISTULA Left 07/07/2016   Procedure: CREATION OF LEFT ARM BRACHIOCEPHALIC FISTULA;  Surgeon: Waynetta Sandy, MD;  Location: [facility];  Service: Vascular;  Laterality: Left;     Social History Social History   Tobacco Use  . Smoking status: Current Every Day Smoker    Packs/day: 0.50    Years: 40.00    Pack years: 20.00    Types: Cigarettes    Start date: 05/12/1974  . Smokeless tobacco: Never Used  . Tobacco comment: 1/2 pk per day  Substance Use Topics  . Alcohol use: No    Alcohol/week: 24.0 standard drinks    Types: 24 Standard drinks or equivalent per week    Comment: occ  . Drug use: No    Family History Family History  Problem Relation Age of Onset  . Hypertension Mother   . Heart attack Father        d/o MI at 12 yo   . Cancer Neg Hx   . Kidney disease Neg Hx     Allergies  Allergies  Allergen Reactions  . Other Other (See Comments)    IV contrast- Renal issues     Current Outpatient Medications  Medication Sig Dispense Refill  . acetaminophen (TYLENOL) 500 MG tablet Take 1,000 mg by mouth every 6 (six) hours as needed for mild pain or moderate  pain.    . cefpodoxime (VANTIN) 200 MG tablet Take 1 tablet (200 mg total) by mouth daily. 5 tablet 0  . diltiazem (CARDIZEM) 30 MG tablet Take 1 tablet (30 mg total) by mouth every 8 (eight) hours. 90 tablet 0  . lidocaine-prilocaine (EMLA) cream Apply 1 application topically once.     . sevelamer carbonate (RENVELA) 0.8 g PACK packet Take 3.2 g by mouth 3 (three) times daily with meals.   0  . warfarin (COUMADIN) 2.5 MG tablet Take 2 tablets (5 mg total) by mouth daily at 6 PM. 60 tablet 0  . zolpidem (AMBIEN) 5 MG tablet Take 5 mg by mouth at bedtime.     No current facility-administered medications for this visit.     ROS:   General:  No weight loss, Fever, chills  HEENT:  No recent headaches, no nasal bleeding, no visual changes, no sore throat  Neurologic: No dizziness, blackouts, seizures. No recent symptoms of stroke or mini- stroke. No recent episodes of slurred speech, or temporary blindness.  Cardiac: No recent episodes of chest pain/pressure, no shortness of breath at rest.  No shortness of breath with exertion.  Denies history of atrial fibrillation or irregular heartbeat  Vascular: No history of rest pain in feet.  No history of claudication.  No history of non-healing ulcer, No history of DVT + hx PE  Pulmonary: No home oxygen, no productive cough, no hemoptysis,  No asthma or wheezing  Musculoskeletal:  [ ]  Arthritis, [ ]  Low back pain,  [ ]  Joint pain  Hematologic:No history of hypercoagulable state.  No history of easy bleeding.  No history of anemia  Gastrointestinal: No hematochezia or melena,  No gastroesophageal reflux, no trouble swallowing  Urinary: [ ]  chronic Kidney disease, [x ] on HD - [ ]  MWF or [x ] TTHS, [ ]  Burning with urination, [ ]  Frequent urination, [ ]  Difficulty urinating;   Skin: No rashes  Psychological: No history of anxiety,  No history of depression   Physical Examination  There were no vitals filed for this visit.  There is no height or weight on file to calculate BMI.  General:  Alert and oriented, no acute distress HEENT: Normal, normocephalic Neck: No bruit or JVD Pulmonary: Clear to auscultation bilaterally Cardiac: Regular Rate and Rhythm without murmur Gastrointestinal: Soft, non-tender, non-distended, no mass Skin: No rash Extremity Pulses:  2+ radial, brachial pulses bilaterally, strong palpable thrill distal 1/2 left brachial cephalic fistula, smaller size with weaker thrill proximal fistula.   Musculoskeletal: No deformity or edema  Neurologic: Upper and lower extremity motor 5/5 and symmetric     ASSESSMENT:  ESRD malfunctioning left Brachial cephalic av fistula   PLAN: We have  scheduled him for fistulagram with possible intervention.  He likly has a midsection stenosis that maybe amendable to angioplasty.  He is scheduled with Dr. Oneida Alar Friday 01/08/2018.  Roxy Horseman PA-C [facility] Office: [phone]

## 2018-01-02 DIAGNOSIS — N186 End stage renal disease: Secondary | ICD-10-CM | POA: Diagnosis not present

## 2018-01-02 DIAGNOSIS — N2581 Secondary hyperparathyroidism of renal origin: Secondary | ICD-10-CM | POA: Diagnosis not present

## 2018-01-02 DIAGNOSIS — Z992 Dependence on renal dialysis: Secondary | ICD-10-CM | POA: Diagnosis not present

## 2018-01-02 DIAGNOSIS — D509 Iron deficiency anemia, unspecified: Secondary | ICD-10-CM | POA: Diagnosis not present

## 2018-01-05 DIAGNOSIS — N2581 Secondary hyperparathyroidism of renal origin: Secondary | ICD-10-CM | POA: Diagnosis not present

## 2018-01-05 DIAGNOSIS — N186 End stage renal disease: Secondary | ICD-10-CM | POA: Diagnosis not present

## 2018-01-05 DIAGNOSIS — Z992 Dependence on renal dialysis: Secondary | ICD-10-CM | POA: Diagnosis not present

## 2018-01-05 DIAGNOSIS — D509 Iron deficiency anemia, unspecified: Secondary | ICD-10-CM | POA: Diagnosis not present

## 2018-01-07 DIAGNOSIS — Z992 Dependence on renal dialysis: Secondary | ICD-10-CM | POA: Diagnosis not present

## 2018-01-07 DIAGNOSIS — D509 Iron deficiency anemia, unspecified: Secondary | ICD-10-CM | POA: Diagnosis not present

## 2018-01-07 DIAGNOSIS — N2581 Secondary hyperparathyroidism of renal origin: Secondary | ICD-10-CM | POA: Diagnosis not present

## 2018-01-07 DIAGNOSIS — N186 End stage renal disease: Secondary | ICD-10-CM | POA: Diagnosis not present

## 2018-01-09 DIAGNOSIS — N186 End stage renal disease: Secondary | ICD-10-CM | POA: Diagnosis not present

## 2018-01-09 DIAGNOSIS — Z992 Dependence on renal dialysis: Secondary | ICD-10-CM | POA: Diagnosis not present

## 2018-01-09 DIAGNOSIS — N2581 Secondary hyperparathyroidism of renal origin: Secondary | ICD-10-CM | POA: Diagnosis not present

## 2018-01-09 DIAGNOSIS — D509 Iron deficiency anemia, unspecified: Secondary | ICD-10-CM | POA: Diagnosis not present

## 2018-01-12 DIAGNOSIS — N2581 Secondary hyperparathyroidism of renal origin: Secondary | ICD-10-CM | POA: Diagnosis not present

## 2018-01-12 DIAGNOSIS — D509 Iron deficiency anemia, unspecified: Secondary | ICD-10-CM | POA: Diagnosis not present

## 2018-01-12 DIAGNOSIS — Z992 Dependence on renal dialysis: Secondary | ICD-10-CM | POA: Diagnosis not present

## 2018-01-12 DIAGNOSIS — N186 End stage renal disease: Secondary | ICD-10-CM | POA: Diagnosis not present

## 2018-01-14 DIAGNOSIS — N2581 Secondary hyperparathyroidism of renal origin: Secondary | ICD-10-CM | POA: Diagnosis not present

## 2018-01-14 DIAGNOSIS — D509 Iron deficiency anemia, unspecified: Secondary | ICD-10-CM | POA: Diagnosis not present

## 2018-01-14 DIAGNOSIS — N186 End stage renal disease: Secondary | ICD-10-CM | POA: Diagnosis not present

## 2018-01-14 DIAGNOSIS — Z992 Dependence on renal dialysis: Secondary | ICD-10-CM | POA: Diagnosis not present

## 2018-01-16 DIAGNOSIS — D509 Iron deficiency anemia, unspecified: Secondary | ICD-10-CM | POA: Diagnosis not present

## 2018-01-16 DIAGNOSIS — Z992 Dependence on renal dialysis: Secondary | ICD-10-CM | POA: Diagnosis not present

## 2018-01-16 DIAGNOSIS — N186 End stage renal disease: Secondary | ICD-10-CM | POA: Diagnosis not present

## 2018-01-16 DIAGNOSIS — N2581 Secondary hyperparathyroidism of renal origin: Secondary | ICD-10-CM | POA: Diagnosis not present

## 2018-01-18 ENCOUNTER — Ambulatory Visit (HOSPITAL_COMMUNITY)
Admission: RE | Admit: 2018-01-18 | Discharge: 2018-01-18 | Disposition: A | Discharge status: Home / Self Care | Payer: Medicare Other | Source: Ambulatory Visit | Attending: Vascular Surgery | Admitting: Vascular Surgery

## 2018-01-18 ENCOUNTER — Other Ambulatory Visit: Payer: Self-pay

## 2018-01-18 ENCOUNTER — Encounter (HOSPITAL_COMMUNITY)
Admission: RE | Disposition: A | Discharge status: Home / Self Care | Payer: Self-pay | Source: Ambulatory Visit | Attending: Vascular Surgery

## 2018-01-18 DIAGNOSIS — I120 Hypertensive chronic kidney disease with stage 5 chronic kidney disease or end stage renal disease: Secondary | ICD-10-CM | POA: Diagnosis not present

## 2018-01-18 DIAGNOSIS — Z7901 Long term (current) use of anticoagulants: Secondary | ICD-10-CM | POA: Insufficient documentation

## 2018-01-18 DIAGNOSIS — Y832 Surgical operation with anastomosis, bypass or graft as the cause of abnormal reaction of the patient, or of later complication, without mention of misadventure at the time of the procedure: Secondary | ICD-10-CM | POA: Diagnosis not present

## 2018-01-18 DIAGNOSIS — Z888 Allergy status to other drugs, medicaments and biological substances status: Secondary | ICD-10-CM | POA: Diagnosis not present

## 2018-01-18 DIAGNOSIS — F17210 Nicotine dependence, cigarettes, uncomplicated: Secondary | ICD-10-CM | POA: Insufficient documentation

## 2018-01-18 DIAGNOSIS — Z8249 Family history of ischemic heart disease and other diseases of the circulatory system: Secondary | ICD-10-CM | POA: Diagnosis not present

## 2018-01-18 DIAGNOSIS — Z86711 Personal history of pulmonary embolism: Secondary | ICD-10-CM | POA: Insufficient documentation

## 2018-01-18 DIAGNOSIS — T82858A Stenosis of vascular prosthetic devices, implants and grafts, initial encounter: Secondary | ICD-10-CM | POA: Diagnosis not present

## 2018-01-18 DIAGNOSIS — R569 Unspecified convulsions: Secondary | ICD-10-CM | POA: Insufficient documentation

## 2018-01-18 DIAGNOSIS — Z9889 Other specified postprocedural states: Secondary | ICD-10-CM | POA: Insufficient documentation

## 2018-01-18 DIAGNOSIS — Z992 Dependence on renal dialysis: Secondary | ICD-10-CM | POA: Diagnosis not present

## 2018-01-18 DIAGNOSIS — G7100 Muscular dystrophy, unspecified: Secondary | ICD-10-CM | POA: Insufficient documentation

## 2018-01-18 DIAGNOSIS — Z91041 Radiographic dye allergy status: Secondary | ICD-10-CM | POA: Diagnosis not present

## 2018-01-18 DIAGNOSIS — N186 End stage renal disease: Secondary | ICD-10-CM | POA: Insufficient documentation

## 2018-01-18 DIAGNOSIS — N185 Chronic kidney disease, stage 5: Secondary | ICD-10-CM | POA: Diagnosis not present

## 2018-01-18 DIAGNOSIS — T82898A Other specified complication of vascular prosthetic devices, implants and grafts, initial encounter: Secondary | ICD-10-CM | POA: Diagnosis not present

## 2018-01-18 HISTORY — PX: PERIPHERAL VASCULAR BALLOON ANGIOPLASTY: CATH118281

## 2018-01-18 HISTORY — PX: A/V FISTULAGRAM: CATH118298

## 2018-01-18 LAB — POCT I-STAT, CHEM 8
BUN: 34 mg/dL — ABNORMAL HIGH (ref 6–20)
CALCIUM ION: 1.07 mmol/L — AB (ref 1.15–1.40)
Chloride: 98 mmol/L (ref 98–111)
Creatinine, Ser: 8.7 mg/dL — ABNORMAL HIGH (ref 0.61–1.24)
Glucose, Bld: 78 mg/dL (ref 70–99)
HCT: 42 % (ref 39.0–52.0)
Hemoglobin: 14.3 g/dL (ref 13.0–17.0)
Potassium: 4.3 mmol/L (ref 3.5–5.1)
SODIUM: 136 mmol/L (ref 135–145)
TCO2: 27 mmol/L (ref 22–32)

## 2018-01-18 SURGERY — A/V FISTULAGRAM
Anesthesia: LOCAL | Laterality: Left

## 2018-01-18 MED ORDER — FENTANYL CITRATE (PF) 100 MCG/2ML IJ SOLN
INTRAMUSCULAR | Status: DC | PRN
  Administered 2018-01-18: 50 ug via INTRAVENOUS

## 2018-01-18 MED ORDER — LIDOCAINE HCL (PF) 1 % IJ SOLN
INTRAMUSCULAR | Status: DC | PRN
  Administered 2018-01-18: 5 mL

## 2018-01-18 MED ORDER — HEPARIN (PORCINE) IN NACL 1000-0.9 UT/500ML-% IV SOLN
INTRAVENOUS | Status: AC
  Filled 2018-01-18: qty 500

## 2018-01-18 MED ORDER — SODIUM CHLORIDE 0.9% FLUSH: 3.0000 mL | Freq: Two times a day (BID) | INTRAVENOUS | Status: DC

## 2018-01-18 MED ORDER — HEPARIN SODIUM (PORCINE) 1000 UNIT/ML IJ SOLN
INTRAMUSCULAR | Status: DC | PRN
  Administered 2018-01-18: 3000 [IU] via INTRAVENOUS

## 2018-01-18 MED ORDER — HEPARIN SODIUM (PORCINE) 1000 UNIT/ML IJ SOLN
INTRAMUSCULAR | Status: AC
  Filled 2018-01-18: qty 1

## 2018-01-18 MED ORDER — IODIXANOL 320 MG/ML IV SOLN
INTRAVENOUS | Status: DC | PRN
  Administered 2018-01-18: 70 mL via INTRAVENOUS

## 2018-01-18 MED ORDER — LIDOCAINE HCL (PF) 1 % IJ SOLN
INTRAMUSCULAR | Status: AC
  Filled 2018-01-18: qty 30

## 2018-01-18 MED ORDER — SODIUM CHLORIDE 0.9% FLUSH: 3.0000 mL | INTRAVENOUS | Status: DC | PRN

## 2018-01-18 MED ORDER — SODIUM CHLORIDE 0.9 % IV SOLN: 250.0000 mL | INTRAVENOUS | Status: DC | PRN

## 2018-01-18 MED ORDER — FENTANYL CITRATE (PF) 100 MCG/2ML IJ SOLN
INTRAMUSCULAR | Status: AC
  Filled 2018-01-18: qty 2

## 2018-01-18 MED ORDER — HEPARIN (PORCINE) IN NACL 1000-0.9 UT/500ML-% IV SOLN
INTRAVENOUS | Status: DC | PRN
  Administered 2018-01-18: 500 mL

## 2018-01-18 SURGICAL SUPPLY — 16 items
BAG SNAP BAND KOVER 36X36 (MISCELLANEOUS) ×2 IMPLANT
BALLN LUTONIX AV 8X40X75 (BALLOONS) ×2
BALLN MUSTANG 7.0X40 75 (BALLOONS) ×2
BALLOON LUTONIX AV 8X40X75 (BALLOONS) IMPLANT
BALLOON MUSTANG 7.0X40 75 (BALLOONS) IMPLANT
COVER DOME SNAP 22 D (MISCELLANEOUS) ×2 IMPLANT
KIT ENCORE 26 ADVANTAGE (KITS) ×1 IMPLANT
KIT MICROPUNCTURE NIT STIFF (SHEATH) ×1 IMPLANT
PROTECTION STATION PRESSURIZED (MISCELLANEOUS) ×2
SHEATH PINNACLE R/O II 7F 4CM (SHEATH) ×1 IMPLANT
SHEATH PROBE COVER 6X72 (BAG) ×1 IMPLANT
STATION PROTECTION PRESSURIZED (MISCELLANEOUS) ×1 IMPLANT
STOPCOCK MORSE 400PSI 3WAY (MISCELLANEOUS) ×2 IMPLANT
TRAY PV CATH (CUSTOM PROCEDURE TRAY) ×2 IMPLANT
TUBING CIL FLEX 10 FLL-RA (TUBING) ×2 IMPLANT
WIRE BENTSON .035X145CM (WIRE) ×1 IMPLANT

## 2018-01-18 NOTE — H&P (Signed)
History of Present Illness:  Patient is a 57 y.o. year old male who presents for evaluation of his fistula.  There is a reported "whistling" sound in the fistula, and flow malfunctioning.   They are only able to use the distal 1/2 of the fistula, the upper 1/2 is hard to stick.             The brachiocephalic AV fistula was difficult to cannulated.  He was scheduled for a fistulagram 11/05/2016 with  .Findings:The fistula and central veins are patent throughout her course. Retrograde angiogram demonstrates flow to the arterial system although this wasn't completely evaluated. There was no intervention undertaken.             Past medical history: HTN, 40 year smoker, ESRD on HD T-TH-S, A fib, and hx of PE.  He is not currently on Coumadin.         Past Medical History:  Diagnosis Date  . Alcohol use   . Anemia   . ESRD (end stage renal disease) ([place])    Hemodialysis TTHS- [facility] in [place]  . Hypertension   . Muscular dystrophy    Limb Girdle  . Pulmonary embolism ([place]) 2011  . Seizures ([place]) 09/2016  . Tobacco abuse   . Vision abnormalities          Past Surgical History:  Procedure Laterality Date  . A/V FISTULAGRAM Left 11/05/2016   Procedure: A/V Fistulagram;  Surgeon: Waynetta Sandy, MD;  Location: [facility];  Service: Cardiovascular;  Laterality: Left;  . AV FISTULA PLACEMENT Left 12/26/2015   Procedure: CREATION OF LEFT RADIAL-CEPHALIC ARTERIOVENOUS FISTULA FOR HEMODIALYSIS;  Surgeon: Vickie Epley, MD;  Location: [facility];  Service: Vascular;  Laterality: Left;  . AV FISTULA PLACEMENT Left 04/28/2016   Procedure: REVISION OF LEFT RADIAL CEPHALIC ARTERIOVENOUS (AV) FISTULA  FOR DURABLE HEMODIALYSIS;  Surgeon: Vickie Epley, MD;  Location: [facility];  Service: Vascular;  Laterality: Left;  . CARDIAC SURGERY    . CENTRAL VENOUS CATHETER INSERTION Right 12/04/2015   Procedure: INSERTION OF TUNNELED HEMODIALYSIS  CATHETER RIGHT INTERNAL JUGULAR;  Surgeon: Vickie Epley, MD;  Location: [facility];  Service: General;  Laterality: Right;  . IR REMOVAL TUN CV CATH W/O FL  12/10/2016  . REVISON OF ARTERIOVENOUS FISTULA Left 07/07/2016   Procedure: CREATION OF LEFT ARM BRACHIOCEPHALIC FISTULA;  Surgeon: Waynetta Sandy, MD;  Location: [facility];  Service: Vascular;  Laterality: Left;     Social History Social History        Tobacco Use  . Smoking status: Current Every Day Smoker    Packs/day: 0.50    Years: 40.00    Pack years: 20.00    Types: Cigarettes    Start date: 05/12/1974  . Smokeless tobacco: Never Used  . Tobacco comment: 1/2 pk per day  Substance Use Topics  . Alcohol use: No    Alcohol/week: 24.0 standard drinks    Types: 24 Standard drinks or equivalent per week    Comment: occ  . Drug use: No    Family History      Family History  Problem Relation Age of Onset  . Hypertension Mother   . Heart attack Father        d/o MI at 24 yo   . Cancer Neg Hx   . Kidney disease Neg Hx     Allergies  Allergies  Allergen Reactions  . Other Other (See Comments)    IV contrast- Renal issues           Current Outpatient Medications  Medication Sig Dispense Refill  . acetaminophen (TYLENOL) 500 MG tablet Take 1,000 mg by mouth every 6 (six) hours as needed for mild pain or moderate pain.    . cefpodoxime (VANTIN) 200 MG tablet Take 1 tablet (200 mg total) by mouth daily. 5 tablet 0  . diltiazem (CARDIZEM) 30 MG tablet Take 1 tablet (30 mg total) by mouth every 8 (eight) hours. 90 tablet 0  . lidocaine-prilocaine (EMLA) cream Apply 1 application topically once.     . sevelamer carbonate (RENVELA) 0.8 g PACK packet Take 3.2 g by mouth 3 (three) times daily with meals.   0  . warfarin (COUMADIN) 2.5 MG tablet Take 2 tablets (5 mg total) by mouth daily at 6 PM. 60 tablet 0  . zolpidem (AMBIEN) 5 MG tablet Take 5 mg by mouth at bedtime.      No current facility-administered medications for this visit.     ROS:   General:  No weight loss, Fever, chills  HEENT: No recent headaches, no nasal bleeding, no visual changes, no sore throat  Neurologic: No dizziness, blackouts, seizures. No recent symptoms of stroke or mini- stroke. No recent episodes of slurred speech, or temporary blindness.  Cardiac: No recent episodes of chest pain/pressure, no shortness of breath at rest.  No shortness of breath with exertion.  Denies history of atrial fibrillation or irregular heartbeat  Vascular: No history of rest pain in feet.  No history of claudication.  No history of non-healing ulcer, No history of DVT + hx PE  Pulmonary: No home oxygen, no productive cough, no hemoptysis,  No asthma or wheezing  Musculoskeletal:  [ ]  Arthritis, [ ]  Low back pain,  [ ]  Joint pain  Hematologic:No history of hypercoagulable state.  No history of easy bleeding.  No history of anemia  Gastrointestinal: No hematochezia or melena,  No gastroesophageal reflux, no trouble swallowing  Urinary: [ ]  chronic Kidney disease, [x ] on HD - [ ]  MWF or [x ] TTHS, [ ]  Burning with urination, [ ]  Frequent urination, [ ]  Difficulty urinating;   Skin: No rashes  Psychological: No history of anxiety,  No history of depression   Physical Examination  There were no vitals filed for this visit.  There is no height or weight on file to calculate BMI.  General:  Alert and oriented, no acute distress HEENT: Normal, normocephalic Neck: No bruit or JVD Pulmonary: Clear to auscultation bilaterally Cardiac: Regular Rate and Rhythm without murmur Gastrointestinal: Soft, non-tender, non-distended, no mass Skin: No rash Extremity Pulses:  2+ radial, brachial pulses bilaterally, strong palpable thrill distal 1/2 left brachial cephalic fistula, smaller size with weaker thrill proximal fistula.   Musculoskeletal: No deformity or edema        Neurologic: Upper and lower extremity motor 5/5 and symmetric     ASSESSMENT:  ESRD malfunctioning left Brachial cephalic av fistula   PLAN: Plan for left arm fistulogram today to evaluate.   Birdia Jaycox C. Donzetta Matters, MD [facility] Office: [phone] Pager: [phone]

## 2018-01-18 NOTE — Discharge Instructions (Signed)

## 2018-01-18 NOTE — Op Note (Signed)
    Patient name: Steven Sellers MRN: [id number] DOB: 01/18/1961 Sex: male  01/18/2018 Pre-operative Diagnosis: End-stage renal disease, malfunction left arm AV fistula Post-operative diagnosis:  Same Surgeon:  Brandon C. Cain, MD Procedure Performed: 1.  Ultrasound-guided cannulation left arm AV fistula 2.  Drug-coated balloon angioplasty with 8 x 40 Lutonix left arm AV fistula  Indications: 57-year-old male has history end-stage renal disease.  He has had a fistula converted from the left radiocephalic to upper arm brachiocephalic.  He now is having some malfunction on dialysis and is indicated for fistulogram with possible intervention.  Findings: There was a tight stenosis approximately 70% just after the cannulation site.  This was balloon angioplastied first with 7 x 40 standard balloon followed by 8 x 40 drug-coated balloon.  At completion there was no flow-limiting stenosis remaining.   Procedure:  The patient was identified in the holding area and taken to room 8.  The patient was then placed supine on the table and prepped and draped in the usual sterile fashion.  A time out was called.  Ultrasound was used to evaluate the left arm AV fistula and an image was saved the permanent record.  This was cannulated with micropuncture needle followed by sheath and images were obtained.  With the above findings we placed a 7 French sheath over a Bentson wire and the patient was given 3000 use of heparin.  We first dilated the lesion with 7 x 40 standard balloon at nominal pressure for 1 minute.  We then used an 8 x 40 Lutonix drug-coated balloon inflated to 8 atm for 2-1/2 minutes.  Angiogram after this demonstrated resolution of the stenosis.  Satisfied with this we removed our wire and sheath placed 4-0 Monocryl.  He tolerated procedure well without immediate complication.  Contrast: 70cc   Brandon C. Cain, MD [facility] Office: [phone] Pager:  [phone]    

## 2018-01-19 ENCOUNTER — Encounter (HOSPITAL_COMMUNITY): Payer: Self-pay | Admitting: Vascular Surgery

## 2018-01-19 DIAGNOSIS — D509 Iron deficiency anemia, unspecified: Secondary | ICD-10-CM | POA: Diagnosis not present

## 2018-01-19 DIAGNOSIS — Z992 Dependence on renal dialysis: Secondary | ICD-10-CM | POA: Diagnosis not present

## 2018-01-19 DIAGNOSIS — N2581 Secondary hyperparathyroidism of renal origin: Secondary | ICD-10-CM | POA: Diagnosis not present

## 2018-01-19 DIAGNOSIS — N186 End stage renal disease: Secondary | ICD-10-CM | POA: Diagnosis not present

## 2018-01-21 DIAGNOSIS — N186 End stage renal disease: Secondary | ICD-10-CM | POA: Diagnosis not present

## 2018-01-21 DIAGNOSIS — D509 Iron deficiency anemia, unspecified: Secondary | ICD-10-CM | POA: Diagnosis not present

## 2018-01-21 DIAGNOSIS — Z992 Dependence on renal dialysis: Secondary | ICD-10-CM | POA: Diagnosis not present

## 2018-01-21 DIAGNOSIS — N2581 Secondary hyperparathyroidism of renal origin: Secondary | ICD-10-CM | POA: Diagnosis not present

## 2018-01-23 DIAGNOSIS — N186 End stage renal disease: Secondary | ICD-10-CM | POA: Diagnosis not present

## 2018-01-23 DIAGNOSIS — N2581 Secondary hyperparathyroidism of renal origin: Secondary | ICD-10-CM | POA: Diagnosis not present

## 2018-01-23 DIAGNOSIS — Z992 Dependence on renal dialysis: Secondary | ICD-10-CM | POA: Diagnosis not present

## 2018-01-23 DIAGNOSIS — D509 Iron deficiency anemia, unspecified: Secondary | ICD-10-CM | POA: Diagnosis not present

## 2018-01-26 DIAGNOSIS — D509 Iron deficiency anemia, unspecified: Secondary | ICD-10-CM | POA: Diagnosis not present

## 2018-01-26 DIAGNOSIS — N186 End stage renal disease: Secondary | ICD-10-CM | POA: Diagnosis not present

## 2018-01-26 DIAGNOSIS — N2581 Secondary hyperparathyroidism of renal origin: Secondary | ICD-10-CM | POA: Diagnosis not present

## 2018-01-26 DIAGNOSIS — Z992 Dependence on renal dialysis: Secondary | ICD-10-CM | POA: Diagnosis not present

## 2018-01-28 DIAGNOSIS — N186 End stage renal disease: Secondary | ICD-10-CM | POA: Diagnosis not present

## 2018-01-28 DIAGNOSIS — D509 Iron deficiency anemia, unspecified: Secondary | ICD-10-CM | POA: Diagnosis not present

## 2018-01-28 DIAGNOSIS — Z992 Dependence on renal dialysis: Secondary | ICD-10-CM | POA: Diagnosis not present

## 2018-01-28 DIAGNOSIS — N2581 Secondary hyperparathyroidism of renal origin: Secondary | ICD-10-CM | POA: Diagnosis not present

## 2018-01-30 DIAGNOSIS — Z992 Dependence on renal dialysis: Secondary | ICD-10-CM | POA: Diagnosis not present

## 2018-01-30 DIAGNOSIS — N2581 Secondary hyperparathyroidism of renal origin: Secondary | ICD-10-CM | POA: Diagnosis not present

## 2018-01-30 DIAGNOSIS — N186 End stage renal disease: Secondary | ICD-10-CM | POA: Diagnosis not present

## 2018-01-30 DIAGNOSIS — D509 Iron deficiency anemia, unspecified: Secondary | ICD-10-CM | POA: Diagnosis not present

## 2018-02-02 DIAGNOSIS — N186 End stage renal disease: Secondary | ICD-10-CM | POA: Diagnosis not present

## 2018-02-02 DIAGNOSIS — N2581 Secondary hyperparathyroidism of renal origin: Secondary | ICD-10-CM | POA: Diagnosis not present

## 2018-02-02 DIAGNOSIS — Z992 Dependence on renal dialysis: Secondary | ICD-10-CM | POA: Diagnosis not present

## 2018-02-02 DIAGNOSIS — D509 Iron deficiency anemia, unspecified: Secondary | ICD-10-CM | POA: Diagnosis not present

## 2018-02-04 DIAGNOSIS — N2581 Secondary hyperparathyroidism of renal origin: Secondary | ICD-10-CM | POA: Diagnosis not present

## 2018-02-04 DIAGNOSIS — N186 End stage renal disease: Secondary | ICD-10-CM | POA: Diagnosis not present

## 2018-02-04 DIAGNOSIS — D509 Iron deficiency anemia, unspecified: Secondary | ICD-10-CM | POA: Diagnosis not present

## 2018-02-04 DIAGNOSIS — Z992 Dependence on renal dialysis: Secondary | ICD-10-CM | POA: Diagnosis not present

## 2018-02-06 DIAGNOSIS — N186 End stage renal disease: Secondary | ICD-10-CM | POA: Diagnosis not present

## 2018-02-06 DIAGNOSIS — Z992 Dependence on renal dialysis: Secondary | ICD-10-CM | POA: Diagnosis not present

## 2018-02-06 DIAGNOSIS — N2581 Secondary hyperparathyroidism of renal origin: Secondary | ICD-10-CM | POA: Diagnosis not present

## 2018-02-06 DIAGNOSIS — D509 Iron deficiency anemia, unspecified: Secondary | ICD-10-CM | POA: Diagnosis not present

## 2018-02-09 DIAGNOSIS — N186 End stage renal disease: Secondary | ICD-10-CM | POA: Diagnosis not present

## 2018-02-09 DIAGNOSIS — N2581 Secondary hyperparathyroidism of renal origin: Secondary | ICD-10-CM | POA: Diagnosis not present

## 2018-02-09 DIAGNOSIS — D509 Iron deficiency anemia, unspecified: Secondary | ICD-10-CM | POA: Diagnosis not present

## 2018-02-09 DIAGNOSIS — Z992 Dependence on renal dialysis: Secondary | ICD-10-CM | POA: Diagnosis not present

## 2018-02-11 DIAGNOSIS — N2581 Secondary hyperparathyroidism of renal origin: Secondary | ICD-10-CM | POA: Diagnosis not present

## 2018-02-11 DIAGNOSIS — N186 End stage renal disease: Secondary | ICD-10-CM | POA: Diagnosis not present

## 2018-02-11 DIAGNOSIS — D509 Iron deficiency anemia, unspecified: Secondary | ICD-10-CM | POA: Diagnosis not present

## 2018-02-11 DIAGNOSIS — Z992 Dependence on renal dialysis: Secondary | ICD-10-CM | POA: Diagnosis not present

## 2018-02-13 DIAGNOSIS — N186 End stage renal disease: Secondary | ICD-10-CM | POA: Diagnosis not present

## 2018-02-13 DIAGNOSIS — D509 Iron deficiency anemia, unspecified: Secondary | ICD-10-CM | POA: Diagnosis not present

## 2018-02-13 DIAGNOSIS — N2581 Secondary hyperparathyroidism of renal origin: Secondary | ICD-10-CM | POA: Diagnosis not present

## 2018-02-13 DIAGNOSIS — Z992 Dependence on renal dialysis: Secondary | ICD-10-CM | POA: Diagnosis not present

## 2018-02-16 DIAGNOSIS — D509 Iron deficiency anemia, unspecified: Secondary | ICD-10-CM | POA: Diagnosis not present

## 2018-02-16 DIAGNOSIS — Z992 Dependence on renal dialysis: Secondary | ICD-10-CM | POA: Diagnosis not present

## 2018-02-16 DIAGNOSIS — N2581 Secondary hyperparathyroidism of renal origin: Secondary | ICD-10-CM | POA: Diagnosis not present

## 2018-02-16 DIAGNOSIS — N186 End stage renal disease: Secondary | ICD-10-CM | POA: Diagnosis not present

## 2018-02-18 DIAGNOSIS — N186 End stage renal disease: Secondary | ICD-10-CM | POA: Diagnosis not present

## 2018-02-18 DIAGNOSIS — N2581 Secondary hyperparathyroidism of renal origin: Secondary | ICD-10-CM | POA: Diagnosis not present

## 2018-02-18 DIAGNOSIS — D509 Iron deficiency anemia, unspecified: Secondary | ICD-10-CM | POA: Diagnosis not present

## 2018-02-18 DIAGNOSIS — Z992 Dependence on renal dialysis: Secondary | ICD-10-CM | POA: Diagnosis not present

## 2018-02-20 DIAGNOSIS — N2581 Secondary hyperparathyroidism of renal origin: Secondary | ICD-10-CM | POA: Diagnosis not present

## 2018-02-20 DIAGNOSIS — N186 End stage renal disease: Secondary | ICD-10-CM | POA: Diagnosis not present

## 2018-02-20 DIAGNOSIS — D509 Iron deficiency anemia, unspecified: Secondary | ICD-10-CM | POA: Diagnosis not present

## 2018-02-20 DIAGNOSIS — Z992 Dependence on renal dialysis: Secondary | ICD-10-CM | POA: Diagnosis not present

## 2018-02-23 DIAGNOSIS — D509 Iron deficiency anemia, unspecified: Secondary | ICD-10-CM | POA: Diagnosis not present

## 2018-02-23 DIAGNOSIS — Z23 Encounter for immunization: Secondary | ICD-10-CM | POA: Diagnosis not present

## 2018-02-23 DIAGNOSIS — Z992 Dependence on renal dialysis: Secondary | ICD-10-CM | POA: Diagnosis not present

## 2018-02-23 DIAGNOSIS — N2581 Secondary hyperparathyroidism of renal origin: Secondary | ICD-10-CM | POA: Diagnosis not present

## 2018-02-23 DIAGNOSIS — N186 End stage renal disease: Secondary | ICD-10-CM | POA: Diagnosis not present

## 2018-02-25 DIAGNOSIS — Z992 Dependence on renal dialysis: Secondary | ICD-10-CM | POA: Diagnosis not present

## 2018-02-25 DIAGNOSIS — Z23 Encounter for immunization: Secondary | ICD-10-CM | POA: Diagnosis not present

## 2018-02-25 DIAGNOSIS — N186 End stage renal disease: Secondary | ICD-10-CM | POA: Diagnosis not present

## 2018-02-25 DIAGNOSIS — N2581 Secondary hyperparathyroidism of renal origin: Secondary | ICD-10-CM | POA: Diagnosis not present

## 2018-02-25 DIAGNOSIS — D509 Iron deficiency anemia, unspecified: Secondary | ICD-10-CM | POA: Diagnosis not present

## 2018-02-27 DIAGNOSIS — N2581 Secondary hyperparathyroidism of renal origin: Secondary | ICD-10-CM | POA: Diagnosis not present

## 2018-02-27 DIAGNOSIS — D509 Iron deficiency anemia, unspecified: Secondary | ICD-10-CM | POA: Diagnosis not present

## 2018-02-27 DIAGNOSIS — Z23 Encounter for immunization: Secondary | ICD-10-CM | POA: Diagnosis not present

## 2018-02-27 DIAGNOSIS — Z992 Dependence on renal dialysis: Secondary | ICD-10-CM | POA: Diagnosis not present

## 2018-02-27 DIAGNOSIS — N186 End stage renal disease: Secondary | ICD-10-CM | POA: Diagnosis not present

## 2018-03-02 DIAGNOSIS — D509 Iron deficiency anemia, unspecified: Secondary | ICD-10-CM | POA: Diagnosis not present

## 2018-03-02 DIAGNOSIS — N186 End stage renal disease: Secondary | ICD-10-CM | POA: Diagnosis not present

## 2018-03-02 DIAGNOSIS — N2581 Secondary hyperparathyroidism of renal origin: Secondary | ICD-10-CM | POA: Diagnosis not present

## 2018-03-02 DIAGNOSIS — Z992 Dependence on renal dialysis: Secondary | ICD-10-CM | POA: Diagnosis not present

## 2018-03-02 DIAGNOSIS — Z23 Encounter for immunization: Secondary | ICD-10-CM | POA: Diagnosis not present

## 2018-03-04 DIAGNOSIS — Z992 Dependence on renal dialysis: Secondary | ICD-10-CM | POA: Diagnosis not present

## 2018-03-04 DIAGNOSIS — N186 End stage renal disease: Secondary | ICD-10-CM | POA: Diagnosis not present

## 2018-03-04 DIAGNOSIS — Z23 Encounter for immunization: Secondary | ICD-10-CM | POA: Diagnosis not present

## 2018-03-04 DIAGNOSIS — D509 Iron deficiency anemia, unspecified: Secondary | ICD-10-CM | POA: Diagnosis not present

## 2018-03-04 DIAGNOSIS — N2581 Secondary hyperparathyroidism of renal origin: Secondary | ICD-10-CM | POA: Diagnosis not present

## 2018-03-06 DIAGNOSIS — D509 Iron deficiency anemia, unspecified: Secondary | ICD-10-CM | POA: Diagnosis not present

## 2018-03-06 DIAGNOSIS — N2581 Secondary hyperparathyroidism of renal origin: Secondary | ICD-10-CM | POA: Diagnosis not present

## 2018-03-06 DIAGNOSIS — Z23 Encounter for immunization: Secondary | ICD-10-CM | POA: Diagnosis not present

## 2018-03-06 DIAGNOSIS — N186 End stage renal disease: Secondary | ICD-10-CM | POA: Diagnosis not present

## 2018-03-06 DIAGNOSIS — Z992 Dependence on renal dialysis: Secondary | ICD-10-CM | POA: Diagnosis not present

## 2018-03-09 DIAGNOSIS — D509 Iron deficiency anemia, unspecified: Secondary | ICD-10-CM | POA: Diagnosis not present

## 2018-03-09 DIAGNOSIS — Z992 Dependence on renal dialysis: Secondary | ICD-10-CM | POA: Diagnosis not present

## 2018-03-09 DIAGNOSIS — N186 End stage renal disease: Secondary | ICD-10-CM | POA: Diagnosis not present

## 2018-03-09 DIAGNOSIS — Z23 Encounter for immunization: Secondary | ICD-10-CM | POA: Diagnosis not present

## 2018-03-09 DIAGNOSIS — N2581 Secondary hyperparathyroidism of renal origin: Secondary | ICD-10-CM | POA: Diagnosis not present

## 2018-03-11 DIAGNOSIS — Z992 Dependence on renal dialysis: Secondary | ICD-10-CM | POA: Diagnosis not present

## 2018-03-11 DIAGNOSIS — D509 Iron deficiency anemia, unspecified: Secondary | ICD-10-CM | POA: Diagnosis not present

## 2018-03-11 DIAGNOSIS — N2581 Secondary hyperparathyroidism of renal origin: Secondary | ICD-10-CM | POA: Diagnosis not present

## 2018-03-11 DIAGNOSIS — Z23 Encounter for immunization: Secondary | ICD-10-CM | POA: Diagnosis not present

## 2018-03-11 DIAGNOSIS — N186 End stage renal disease: Secondary | ICD-10-CM | POA: Diagnosis not present

## 2018-03-13 DIAGNOSIS — N2581 Secondary hyperparathyroidism of renal origin: Secondary | ICD-10-CM | POA: Diagnosis not present

## 2018-03-13 DIAGNOSIS — Z992 Dependence on renal dialysis: Secondary | ICD-10-CM | POA: Diagnosis not present

## 2018-03-13 DIAGNOSIS — N186 End stage renal disease: Secondary | ICD-10-CM | POA: Diagnosis not present

## 2018-03-13 DIAGNOSIS — D509 Iron deficiency anemia, unspecified: Secondary | ICD-10-CM | POA: Diagnosis not present

## 2018-03-16 DIAGNOSIS — D509 Iron deficiency anemia, unspecified: Secondary | ICD-10-CM | POA: Diagnosis not present

## 2018-03-16 DIAGNOSIS — N186 End stage renal disease: Secondary | ICD-10-CM | POA: Diagnosis not present

## 2018-03-16 DIAGNOSIS — N2581 Secondary hyperparathyroidism of renal origin: Secondary | ICD-10-CM | POA: Diagnosis not present

## 2018-03-16 DIAGNOSIS — Z992 Dependence on renal dialysis: Secondary | ICD-10-CM | POA: Diagnosis not present

## 2018-03-18 DIAGNOSIS — N186 End stage renal disease: Secondary | ICD-10-CM | POA: Diagnosis not present

## 2018-03-18 DIAGNOSIS — N2581 Secondary hyperparathyroidism of renal origin: Secondary | ICD-10-CM | POA: Diagnosis not present

## 2018-03-18 DIAGNOSIS — D509 Iron deficiency anemia, unspecified: Secondary | ICD-10-CM | POA: Diagnosis not present

## 2018-03-18 DIAGNOSIS — Z992 Dependence on renal dialysis: Secondary | ICD-10-CM | POA: Diagnosis not present

## 2018-03-20 DIAGNOSIS — Z992 Dependence on renal dialysis: Secondary | ICD-10-CM | POA: Diagnosis not present

## 2018-03-20 DIAGNOSIS — N2581 Secondary hyperparathyroidism of renal origin: Secondary | ICD-10-CM | POA: Diagnosis not present

## 2018-03-20 DIAGNOSIS — D509 Iron deficiency anemia, unspecified: Secondary | ICD-10-CM | POA: Diagnosis not present

## 2018-03-20 DIAGNOSIS — N186 End stage renal disease: Secondary | ICD-10-CM | POA: Diagnosis not present

## 2018-03-23 DIAGNOSIS — Z992 Dependence on renal dialysis: Secondary | ICD-10-CM | POA: Diagnosis not present

## 2018-03-23 DIAGNOSIS — D509 Iron deficiency anemia, unspecified: Secondary | ICD-10-CM | POA: Diagnosis not present

## 2018-03-23 DIAGNOSIS — N2581 Secondary hyperparathyroidism of renal origin: Secondary | ICD-10-CM | POA: Diagnosis not present

## 2018-03-23 DIAGNOSIS — N186 End stage renal disease: Secondary | ICD-10-CM | POA: Diagnosis not present

## 2018-03-25 DIAGNOSIS — Z992 Dependence on renal dialysis: Secondary | ICD-10-CM | POA: Diagnosis not present

## 2018-03-25 DIAGNOSIS — D509 Iron deficiency anemia, unspecified: Secondary | ICD-10-CM | POA: Diagnosis not present

## 2018-03-25 DIAGNOSIS — N2581 Secondary hyperparathyroidism of renal origin: Secondary | ICD-10-CM | POA: Diagnosis not present

## 2018-03-25 DIAGNOSIS — N186 End stage renal disease: Secondary | ICD-10-CM | POA: Diagnosis not present

## 2018-03-27 DIAGNOSIS — N186 End stage renal disease: Secondary | ICD-10-CM | POA: Diagnosis not present

## 2018-03-27 DIAGNOSIS — Z992 Dependence on renal dialysis: Secondary | ICD-10-CM | POA: Diagnosis not present

## 2018-03-27 DIAGNOSIS — D509 Iron deficiency anemia, unspecified: Secondary | ICD-10-CM | POA: Diagnosis not present

## 2018-03-27 DIAGNOSIS — N2581 Secondary hyperparathyroidism of renal origin: Secondary | ICD-10-CM | POA: Diagnosis not present

## 2018-03-30 DIAGNOSIS — N2581 Secondary hyperparathyroidism of renal origin: Secondary | ICD-10-CM | POA: Diagnosis not present

## 2018-03-30 DIAGNOSIS — N186 End stage renal disease: Secondary | ICD-10-CM | POA: Diagnosis not present

## 2018-03-30 DIAGNOSIS — D509 Iron deficiency anemia, unspecified: Secondary | ICD-10-CM | POA: Diagnosis not present

## 2018-03-30 DIAGNOSIS — Z992 Dependence on renal dialysis: Secondary | ICD-10-CM | POA: Diagnosis not present

## 2018-03-31 DIAGNOSIS — I4891 Unspecified atrial fibrillation: Secondary | ICD-10-CM | POA: Diagnosis not present

## 2018-03-31 DIAGNOSIS — N186 End stage renal disease: Secondary | ICD-10-CM | POA: Diagnosis not present

## 2018-03-31 DIAGNOSIS — Z1389 Encounter for screening for other disorder: Secondary | ICD-10-CM | POA: Diagnosis not present

## 2018-03-31 DIAGNOSIS — E782 Mixed hyperlipidemia: Secondary | ICD-10-CM | POA: Diagnosis not present

## 2018-03-31 DIAGNOSIS — I509 Heart failure, unspecified: Secondary | ICD-10-CM | POA: Diagnosis not present

## 2018-03-31 DIAGNOSIS — I10 Essential (primary) hypertension: Secondary | ICD-10-CM | POA: Diagnosis not present

## 2018-03-31 DIAGNOSIS — Z681 Body mass index (BMI) 19 or less, adult: Secondary | ICD-10-CM | POA: Diagnosis not present

## 2018-03-31 DIAGNOSIS — F17200 Nicotine dependence, unspecified, uncomplicated: Secondary | ICD-10-CM | POA: Diagnosis not present

## 2018-03-31 DIAGNOSIS — I132 Hypertensive heart and chronic kidney disease with heart failure and with stage 5 chronic kidney disease, or end stage renal disease: Secondary | ICD-10-CM | POA: Diagnosis not present

## 2018-03-31 DIAGNOSIS — Z719 Counseling, unspecified: Secondary | ICD-10-CM | POA: Diagnosis not present

## 2018-04-01 DIAGNOSIS — D509 Iron deficiency anemia, unspecified: Secondary | ICD-10-CM | POA: Diagnosis not present

## 2018-04-01 DIAGNOSIS — N2581 Secondary hyperparathyroidism of renal origin: Secondary | ICD-10-CM | POA: Diagnosis not present

## 2018-04-01 DIAGNOSIS — Z992 Dependence on renal dialysis: Secondary | ICD-10-CM | POA: Diagnosis not present

## 2018-04-01 DIAGNOSIS — N186 End stage renal disease: Secondary | ICD-10-CM | POA: Diagnosis not present

## 2018-04-03 DIAGNOSIS — N186 End stage renal disease: Secondary | ICD-10-CM | POA: Diagnosis not present

## 2018-04-03 DIAGNOSIS — N2581 Secondary hyperparathyroidism of renal origin: Secondary | ICD-10-CM | POA: Diagnosis not present

## 2018-04-03 DIAGNOSIS — Z992 Dependence on renal dialysis: Secondary | ICD-10-CM | POA: Diagnosis not present

## 2018-04-03 DIAGNOSIS — D509 Iron deficiency anemia, unspecified: Secondary | ICD-10-CM | POA: Diagnosis not present

## 2018-04-06 DIAGNOSIS — N186 End stage renal disease: Secondary | ICD-10-CM | POA: Diagnosis not present

## 2018-04-06 DIAGNOSIS — D509 Iron deficiency anemia, unspecified: Secondary | ICD-10-CM | POA: Diagnosis not present

## 2018-04-06 DIAGNOSIS — Z992 Dependence on renal dialysis: Secondary | ICD-10-CM | POA: Diagnosis not present

## 2018-04-06 DIAGNOSIS — N2581 Secondary hyperparathyroidism of renal origin: Secondary | ICD-10-CM | POA: Diagnosis not present

## 2018-04-08 DIAGNOSIS — D509 Iron deficiency anemia, unspecified: Secondary | ICD-10-CM | POA: Diagnosis not present

## 2018-04-08 DIAGNOSIS — Z992 Dependence on renal dialysis: Secondary | ICD-10-CM | POA: Diagnosis not present

## 2018-04-08 DIAGNOSIS — N2581 Secondary hyperparathyroidism of renal origin: Secondary | ICD-10-CM | POA: Diagnosis not present

## 2018-04-08 DIAGNOSIS — N186 End stage renal disease: Secondary | ICD-10-CM | POA: Diagnosis not present

## 2018-04-10 DIAGNOSIS — Z992 Dependence on renal dialysis: Secondary | ICD-10-CM | POA: Diagnosis not present

## 2018-04-10 DIAGNOSIS — N2581 Secondary hyperparathyroidism of renal origin: Secondary | ICD-10-CM | POA: Diagnosis not present

## 2018-04-10 DIAGNOSIS — N186 End stage renal disease: Secondary | ICD-10-CM | POA: Diagnosis not present

## 2018-04-10 DIAGNOSIS — D509 Iron deficiency anemia, unspecified: Secondary | ICD-10-CM | POA: Diagnosis not present

## 2018-04-13 DIAGNOSIS — Z992 Dependence on renal dialysis: Secondary | ICD-10-CM | POA: Diagnosis not present

## 2018-04-13 DIAGNOSIS — N2581 Secondary hyperparathyroidism of renal origin: Secondary | ICD-10-CM | POA: Diagnosis not present

## 2018-04-13 DIAGNOSIS — N186 End stage renal disease: Secondary | ICD-10-CM | POA: Diagnosis not present

## 2018-04-13 DIAGNOSIS — D509 Iron deficiency anemia, unspecified: Secondary | ICD-10-CM | POA: Diagnosis not present

## 2018-04-15 DIAGNOSIS — N2581 Secondary hyperparathyroidism of renal origin: Secondary | ICD-10-CM | POA: Diagnosis not present

## 2018-04-15 DIAGNOSIS — Z992 Dependence on renal dialysis: Secondary | ICD-10-CM | POA: Diagnosis not present

## 2018-04-15 DIAGNOSIS — D509 Iron deficiency anemia, unspecified: Secondary | ICD-10-CM | POA: Diagnosis not present

## 2018-04-15 DIAGNOSIS — N186 End stage renal disease: Secondary | ICD-10-CM | POA: Diagnosis not present

## 2018-04-17 DIAGNOSIS — D509 Iron deficiency anemia, unspecified: Secondary | ICD-10-CM | POA: Diagnosis not present

## 2018-04-17 DIAGNOSIS — N2581 Secondary hyperparathyroidism of renal origin: Secondary | ICD-10-CM | POA: Diagnosis not present

## 2018-04-17 DIAGNOSIS — Z992 Dependence on renal dialysis: Secondary | ICD-10-CM | POA: Diagnosis not present

## 2018-04-17 DIAGNOSIS — N186 End stage renal disease: Secondary | ICD-10-CM | POA: Diagnosis not present

## 2018-04-20 DIAGNOSIS — D509 Iron deficiency anemia, unspecified: Secondary | ICD-10-CM | POA: Diagnosis not present

## 2018-04-20 DIAGNOSIS — N186 End stage renal disease: Secondary | ICD-10-CM | POA: Diagnosis not present

## 2018-04-20 DIAGNOSIS — N2581 Secondary hyperparathyroidism of renal origin: Secondary | ICD-10-CM | POA: Diagnosis not present

## 2018-04-20 DIAGNOSIS — Z992 Dependence on renal dialysis: Secondary | ICD-10-CM | POA: Diagnosis not present

## 2018-04-22 DIAGNOSIS — D509 Iron deficiency anemia, unspecified: Secondary | ICD-10-CM | POA: Diagnosis not present

## 2018-04-22 DIAGNOSIS — N2581 Secondary hyperparathyroidism of renal origin: Secondary | ICD-10-CM | POA: Diagnosis not present

## 2018-04-22 DIAGNOSIS — Z992 Dependence on renal dialysis: Secondary | ICD-10-CM | POA: Diagnosis not present

## 2018-04-22 DIAGNOSIS — N186 End stage renal disease: Secondary | ICD-10-CM | POA: Diagnosis not present

## 2018-04-24 DIAGNOSIS — Z992 Dependence on renal dialysis: Secondary | ICD-10-CM | POA: Diagnosis not present

## 2018-04-24 DIAGNOSIS — N186 End stage renal disease: Secondary | ICD-10-CM | POA: Diagnosis not present

## 2018-04-24 DIAGNOSIS — D509 Iron deficiency anemia, unspecified: Secondary | ICD-10-CM | POA: Diagnosis not present

## 2018-04-24 DIAGNOSIS — N2581 Secondary hyperparathyroidism of renal origin: Secondary | ICD-10-CM | POA: Diagnosis not present

## 2018-04-27 DIAGNOSIS — Z992 Dependence on renal dialysis: Secondary | ICD-10-CM | POA: Diagnosis not present

## 2018-04-27 DIAGNOSIS — N2581 Secondary hyperparathyroidism of renal origin: Secondary | ICD-10-CM | POA: Diagnosis not present

## 2018-04-27 DIAGNOSIS — N186 End stage renal disease: Secondary | ICD-10-CM | POA: Diagnosis not present

## 2018-04-27 DIAGNOSIS — D509 Iron deficiency anemia, unspecified: Secondary | ICD-10-CM | POA: Diagnosis not present

## 2018-04-29 DIAGNOSIS — N186 End stage renal disease: Secondary | ICD-10-CM | POA: Diagnosis not present

## 2018-04-29 DIAGNOSIS — Z992 Dependence on renal dialysis: Secondary | ICD-10-CM | POA: Diagnosis not present

## 2018-04-29 DIAGNOSIS — D509 Iron deficiency anemia, unspecified: Secondary | ICD-10-CM | POA: Diagnosis not present

## 2018-04-29 DIAGNOSIS — N2581 Secondary hyperparathyroidism of renal origin: Secondary | ICD-10-CM | POA: Diagnosis not present

## 2018-05-01 DIAGNOSIS — N2581 Secondary hyperparathyroidism of renal origin: Secondary | ICD-10-CM | POA: Diagnosis not present

## 2018-05-01 DIAGNOSIS — D509 Iron deficiency anemia, unspecified: Secondary | ICD-10-CM | POA: Diagnosis not present

## 2018-05-01 DIAGNOSIS — Z992 Dependence on renal dialysis: Secondary | ICD-10-CM | POA: Diagnosis not present

## 2018-05-01 DIAGNOSIS — N186 End stage renal disease: Secondary | ICD-10-CM | POA: Diagnosis not present

## 2018-05-03 DIAGNOSIS — Z992 Dependence on renal dialysis: Secondary | ICD-10-CM | POA: Diagnosis not present

## 2018-05-03 DIAGNOSIS — N2581 Secondary hyperparathyroidism of renal origin: Secondary | ICD-10-CM | POA: Diagnosis not present

## 2018-05-03 DIAGNOSIS — D509 Iron deficiency anemia, unspecified: Secondary | ICD-10-CM | POA: Diagnosis not present

## 2018-05-03 DIAGNOSIS — N186 End stage renal disease: Secondary | ICD-10-CM | POA: Diagnosis not present

## 2018-05-06 DIAGNOSIS — D509 Iron deficiency anemia, unspecified: Secondary | ICD-10-CM | POA: Diagnosis not present

## 2018-05-06 DIAGNOSIS — Z992 Dependence on renal dialysis: Secondary | ICD-10-CM | POA: Diagnosis not present

## 2018-05-06 DIAGNOSIS — N186 End stage renal disease: Secondary | ICD-10-CM | POA: Diagnosis not present

## 2018-05-06 DIAGNOSIS — N2581 Secondary hyperparathyroidism of renal origin: Secondary | ICD-10-CM | POA: Diagnosis not present

## 2018-05-08 DIAGNOSIS — D509 Iron deficiency anemia, unspecified: Secondary | ICD-10-CM | POA: Diagnosis not present

## 2018-05-08 DIAGNOSIS — N2581 Secondary hyperparathyroidism of renal origin: Secondary | ICD-10-CM | POA: Diagnosis not present

## 2018-05-08 DIAGNOSIS — N186 End stage renal disease: Secondary | ICD-10-CM | POA: Diagnosis not present

## 2018-05-08 DIAGNOSIS — Z992 Dependence on renal dialysis: Secondary | ICD-10-CM | POA: Diagnosis not present

## 2018-05-11 DIAGNOSIS — Z992 Dependence on renal dialysis: Secondary | ICD-10-CM | POA: Diagnosis not present

## 2018-05-11 DIAGNOSIS — D509 Iron deficiency anemia, unspecified: Secondary | ICD-10-CM | POA: Diagnosis not present

## 2018-05-11 DIAGNOSIS — N186 End stage renal disease: Secondary | ICD-10-CM | POA: Diagnosis not present

## 2018-05-11 DIAGNOSIS — N2581 Secondary hyperparathyroidism of renal origin: Secondary | ICD-10-CM | POA: Diagnosis not present

## 2018-05-13 DIAGNOSIS — Z992 Dependence on renal dialysis: Secondary | ICD-10-CM | POA: Diagnosis not present

## 2018-05-13 DIAGNOSIS — N2581 Secondary hyperparathyroidism of renal origin: Secondary | ICD-10-CM | POA: Diagnosis not present

## 2018-05-13 DIAGNOSIS — N186 End stage renal disease: Secondary | ICD-10-CM | POA: Diagnosis not present

## 2018-05-13 DIAGNOSIS — D509 Iron deficiency anemia, unspecified: Secondary | ICD-10-CM | POA: Diagnosis not present

## 2018-05-15 DIAGNOSIS — N186 End stage renal disease: Secondary | ICD-10-CM | POA: Diagnosis not present

## 2018-05-15 DIAGNOSIS — N2581 Secondary hyperparathyroidism of renal origin: Secondary | ICD-10-CM | POA: Diagnosis not present

## 2018-05-15 DIAGNOSIS — D509 Iron deficiency anemia, unspecified: Secondary | ICD-10-CM | POA: Diagnosis not present

## 2018-05-15 DIAGNOSIS — Z992 Dependence on renal dialysis: Secondary | ICD-10-CM | POA: Diagnosis not present

## 2018-05-18 DIAGNOSIS — N186 End stage renal disease: Secondary | ICD-10-CM | POA: Diagnosis not present

## 2018-05-18 DIAGNOSIS — D509 Iron deficiency anemia, unspecified: Secondary | ICD-10-CM | POA: Diagnosis not present

## 2018-05-18 DIAGNOSIS — N2581 Secondary hyperparathyroidism of renal origin: Secondary | ICD-10-CM | POA: Diagnosis not present

## 2018-05-18 DIAGNOSIS — Z992 Dependence on renal dialysis: Secondary | ICD-10-CM | POA: Diagnosis not present

## 2018-05-20 DIAGNOSIS — N2581 Secondary hyperparathyroidism of renal origin: Secondary | ICD-10-CM | POA: Diagnosis not present

## 2018-05-20 DIAGNOSIS — N186 End stage renal disease: Secondary | ICD-10-CM | POA: Diagnosis not present

## 2018-05-20 DIAGNOSIS — Z992 Dependence on renal dialysis: Secondary | ICD-10-CM | POA: Diagnosis not present

## 2018-05-20 DIAGNOSIS — D509 Iron deficiency anemia, unspecified: Secondary | ICD-10-CM | POA: Diagnosis not present

## 2018-05-22 DIAGNOSIS — N2581 Secondary hyperparathyroidism of renal origin: Secondary | ICD-10-CM | POA: Diagnosis not present

## 2018-05-22 DIAGNOSIS — D509 Iron deficiency anemia, unspecified: Secondary | ICD-10-CM | POA: Diagnosis not present

## 2018-05-22 DIAGNOSIS — Z992 Dependence on renal dialysis: Secondary | ICD-10-CM | POA: Diagnosis not present

## 2018-05-22 DIAGNOSIS — N186 End stage renal disease: Secondary | ICD-10-CM | POA: Diagnosis not present

## 2018-05-25 DIAGNOSIS — N2581 Secondary hyperparathyroidism of renal origin: Secondary | ICD-10-CM | POA: Diagnosis not present

## 2018-05-25 DIAGNOSIS — N186 End stage renal disease: Secondary | ICD-10-CM | POA: Diagnosis not present

## 2018-05-25 DIAGNOSIS — D509 Iron deficiency anemia, unspecified: Secondary | ICD-10-CM | POA: Diagnosis not present

## 2018-05-25 DIAGNOSIS — Z992 Dependence on renal dialysis: Secondary | ICD-10-CM | POA: Diagnosis not present

## 2018-05-27 DIAGNOSIS — N186 End stage renal disease: Secondary | ICD-10-CM | POA: Diagnosis not present

## 2018-05-27 DIAGNOSIS — D509 Iron deficiency anemia, unspecified: Secondary | ICD-10-CM | POA: Diagnosis not present

## 2018-05-27 DIAGNOSIS — Z992 Dependence on renal dialysis: Secondary | ICD-10-CM | POA: Diagnosis not present

## 2018-05-27 DIAGNOSIS — N2581 Secondary hyperparathyroidism of renal origin: Secondary | ICD-10-CM | POA: Diagnosis not present

## 2018-05-29 DIAGNOSIS — N2581 Secondary hyperparathyroidism of renal origin: Secondary | ICD-10-CM | POA: Diagnosis not present

## 2018-05-29 DIAGNOSIS — Z992 Dependence on renal dialysis: Secondary | ICD-10-CM | POA: Diagnosis not present

## 2018-05-29 DIAGNOSIS — D509 Iron deficiency anemia, unspecified: Secondary | ICD-10-CM | POA: Diagnosis not present

## 2018-05-29 DIAGNOSIS — N186 End stage renal disease: Secondary | ICD-10-CM | POA: Diagnosis not present

## 2018-06-01 DIAGNOSIS — D509 Iron deficiency anemia, unspecified: Secondary | ICD-10-CM | POA: Diagnosis not present

## 2018-06-01 DIAGNOSIS — N2581 Secondary hyperparathyroidism of renal origin: Secondary | ICD-10-CM | POA: Diagnosis not present

## 2018-06-01 DIAGNOSIS — Z992 Dependence on renal dialysis: Secondary | ICD-10-CM | POA: Diagnosis not present

## 2018-06-01 DIAGNOSIS — N186 End stage renal disease: Secondary | ICD-10-CM | POA: Diagnosis not present

## 2018-06-03 DIAGNOSIS — Z992 Dependence on renal dialysis: Secondary | ICD-10-CM | POA: Diagnosis not present

## 2018-06-03 DIAGNOSIS — N186 End stage renal disease: Secondary | ICD-10-CM | POA: Diagnosis not present

## 2018-06-03 DIAGNOSIS — D509 Iron deficiency anemia, unspecified: Secondary | ICD-10-CM | POA: Diagnosis not present

## 2018-06-03 DIAGNOSIS — N2581 Secondary hyperparathyroidism of renal origin: Secondary | ICD-10-CM | POA: Diagnosis not present

## 2018-06-05 DIAGNOSIS — Z992 Dependence on renal dialysis: Secondary | ICD-10-CM | POA: Diagnosis not present

## 2018-06-05 DIAGNOSIS — N186 End stage renal disease: Secondary | ICD-10-CM | POA: Diagnosis not present

## 2018-06-05 DIAGNOSIS — N2581 Secondary hyperparathyroidism of renal origin: Secondary | ICD-10-CM | POA: Diagnosis not present

## 2018-06-05 DIAGNOSIS — D509 Iron deficiency anemia, unspecified: Secondary | ICD-10-CM | POA: Diagnosis not present

## 2018-06-08 DIAGNOSIS — N2581 Secondary hyperparathyroidism of renal origin: Secondary | ICD-10-CM | POA: Diagnosis not present

## 2018-06-08 DIAGNOSIS — Z992 Dependence on renal dialysis: Secondary | ICD-10-CM | POA: Diagnosis not present

## 2018-06-08 DIAGNOSIS — N186 End stage renal disease: Secondary | ICD-10-CM | POA: Diagnosis not present

## 2018-06-08 DIAGNOSIS — D509 Iron deficiency anemia, unspecified: Secondary | ICD-10-CM | POA: Diagnosis not present

## 2018-06-09 ENCOUNTER — Other Ambulatory Visit (HOSPITAL_COMMUNITY): Payer: Self-pay | Admitting: Family Medicine

## 2018-06-09 ENCOUNTER — Ambulatory Visit (HOSPITAL_COMMUNITY)
Admission: RE | Admit: 2018-06-09 | Discharge: 2018-06-09 | Disposition: A | Discharge status: Home / Self Care | Payer: Medicare Other | Source: Ambulatory Visit | Attending: Family Medicine | Admitting: Family Medicine

## 2018-06-09 DIAGNOSIS — J069 Acute upper respiratory infection, unspecified: Secondary | ICD-10-CM

## 2018-06-09 DIAGNOSIS — N186 End stage renal disease: Secondary | ICD-10-CM | POA: Diagnosis not present

## 2018-06-09 DIAGNOSIS — G7100 Muscular dystrophy, unspecified: Secondary | ICD-10-CM | POA: Diagnosis not present

## 2018-06-09 DIAGNOSIS — I509 Heart failure, unspecified: Secondary | ICD-10-CM | POA: Diagnosis not present

## 2018-06-09 DIAGNOSIS — R05 Cough: Secondary | ICD-10-CM | POA: Diagnosis not present

## 2018-06-09 DIAGNOSIS — Z681 Body mass index (BMI) 19 or less, adult: Secondary | ICD-10-CM | POA: Diagnosis not present

## 2018-06-09 DIAGNOSIS — F17200 Nicotine dependence, unspecified, uncomplicated: Secondary | ICD-10-CM | POA: Diagnosis not present

## 2018-06-09 DIAGNOSIS — R0602 Shortness of breath: Secondary | ICD-10-CM | POA: Diagnosis not present

## 2018-06-11 DIAGNOSIS — Z992 Dependence on renal dialysis: Secondary | ICD-10-CM | POA: Diagnosis not present

## 2018-06-11 DIAGNOSIS — N186 End stage renal disease: Secondary | ICD-10-CM | POA: Diagnosis not present

## 2018-06-12 DIAGNOSIS — Z992 Dependence on renal dialysis: Secondary | ICD-10-CM | POA: Diagnosis not present

## 2018-06-12 DIAGNOSIS — D509 Iron deficiency anemia, unspecified: Secondary | ICD-10-CM | POA: Diagnosis not present

## 2018-06-12 DIAGNOSIS — N186 End stage renal disease: Secondary | ICD-10-CM | POA: Diagnosis not present

## 2018-06-12 DIAGNOSIS — N2581 Secondary hyperparathyroidism of renal origin: Secondary | ICD-10-CM | POA: Diagnosis not present

## 2018-06-15 DIAGNOSIS — N186 End stage renal disease: Secondary | ICD-10-CM | POA: Diagnosis not present

## 2018-06-15 DIAGNOSIS — Z992 Dependence on renal dialysis: Secondary | ICD-10-CM | POA: Diagnosis not present

## 2018-06-15 DIAGNOSIS — N2581 Secondary hyperparathyroidism of renal origin: Secondary | ICD-10-CM | POA: Diagnosis not present

## 2018-06-15 DIAGNOSIS — D509 Iron deficiency anemia, unspecified: Secondary | ICD-10-CM | POA: Diagnosis not present

## 2018-06-17 DIAGNOSIS — D509 Iron deficiency anemia, unspecified: Secondary | ICD-10-CM | POA: Diagnosis not present

## 2018-06-17 DIAGNOSIS — Z992 Dependence on renal dialysis: Secondary | ICD-10-CM | POA: Diagnosis not present

## 2018-06-17 DIAGNOSIS — N186 End stage renal disease: Secondary | ICD-10-CM | POA: Diagnosis not present

## 2018-06-17 DIAGNOSIS — N2581 Secondary hyperparathyroidism of renal origin: Secondary | ICD-10-CM | POA: Diagnosis not present

## 2018-06-19 DIAGNOSIS — N186 End stage renal disease: Secondary | ICD-10-CM | POA: Diagnosis not present

## 2018-06-19 DIAGNOSIS — D509 Iron deficiency anemia, unspecified: Secondary | ICD-10-CM | POA: Diagnosis not present

## 2018-06-19 DIAGNOSIS — Z992 Dependence on renal dialysis: Secondary | ICD-10-CM | POA: Diagnosis not present

## 2018-06-19 DIAGNOSIS — N2581 Secondary hyperparathyroidism of renal origin: Secondary | ICD-10-CM | POA: Diagnosis not present

## 2018-06-22 DIAGNOSIS — Z992 Dependence on renal dialysis: Secondary | ICD-10-CM | POA: Diagnosis not present

## 2018-06-22 DIAGNOSIS — N186 End stage renal disease: Secondary | ICD-10-CM | POA: Diagnosis not present

## 2018-06-22 DIAGNOSIS — D509 Iron deficiency anemia, unspecified: Secondary | ICD-10-CM | POA: Diagnosis not present

## 2018-06-22 DIAGNOSIS — N2581 Secondary hyperparathyroidism of renal origin: Secondary | ICD-10-CM | POA: Diagnosis not present

## 2018-06-24 DIAGNOSIS — D509 Iron deficiency anemia, unspecified: Secondary | ICD-10-CM | POA: Diagnosis not present

## 2018-06-24 DIAGNOSIS — N2581 Secondary hyperparathyroidism of renal origin: Secondary | ICD-10-CM | POA: Diagnosis not present

## 2018-06-24 DIAGNOSIS — N186 End stage renal disease: Secondary | ICD-10-CM | POA: Diagnosis not present

## 2018-06-24 DIAGNOSIS — Z992 Dependence on renal dialysis: Secondary | ICD-10-CM | POA: Diagnosis not present

## 2018-06-26 DIAGNOSIS — N186 End stage renal disease: Secondary | ICD-10-CM | POA: Diagnosis not present

## 2018-06-26 DIAGNOSIS — N2581 Secondary hyperparathyroidism of renal origin: Secondary | ICD-10-CM | POA: Diagnosis not present

## 2018-06-26 DIAGNOSIS — D509 Iron deficiency anemia, unspecified: Secondary | ICD-10-CM | POA: Diagnosis not present

## 2018-06-26 DIAGNOSIS — Z992 Dependence on renal dialysis: Secondary | ICD-10-CM | POA: Diagnosis not present

## 2018-06-29 DIAGNOSIS — Z992 Dependence on renal dialysis: Secondary | ICD-10-CM | POA: Diagnosis not present

## 2018-06-29 DIAGNOSIS — N186 End stage renal disease: Secondary | ICD-10-CM | POA: Diagnosis not present

## 2018-06-29 DIAGNOSIS — D509 Iron deficiency anemia, unspecified: Secondary | ICD-10-CM | POA: Diagnosis not present

## 2018-06-29 DIAGNOSIS — N2581 Secondary hyperparathyroidism of renal origin: Secondary | ICD-10-CM | POA: Diagnosis not present

## 2018-07-01 DIAGNOSIS — D509 Iron deficiency anemia, unspecified: Secondary | ICD-10-CM | POA: Diagnosis not present

## 2018-07-01 DIAGNOSIS — N2581 Secondary hyperparathyroidism of renal origin: Secondary | ICD-10-CM | POA: Diagnosis not present

## 2018-07-01 DIAGNOSIS — N186 End stage renal disease: Secondary | ICD-10-CM | POA: Diagnosis not present

## 2018-07-01 DIAGNOSIS — Z992 Dependence on renal dialysis: Secondary | ICD-10-CM | POA: Diagnosis not present

## 2018-07-03 DIAGNOSIS — D509 Iron deficiency anemia, unspecified: Secondary | ICD-10-CM | POA: Diagnosis not present

## 2018-07-03 DIAGNOSIS — Z992 Dependence on renal dialysis: Secondary | ICD-10-CM | POA: Diagnosis not present

## 2018-07-03 DIAGNOSIS — N2581 Secondary hyperparathyroidism of renal origin: Secondary | ICD-10-CM | POA: Diagnosis not present

## 2018-07-03 DIAGNOSIS — N186 End stage renal disease: Secondary | ICD-10-CM | POA: Diagnosis not present

## 2018-07-06 DIAGNOSIS — N2581 Secondary hyperparathyroidism of renal origin: Secondary | ICD-10-CM | POA: Diagnosis not present

## 2018-07-06 DIAGNOSIS — Z992 Dependence on renal dialysis: Secondary | ICD-10-CM | POA: Diagnosis not present

## 2018-07-06 DIAGNOSIS — D509 Iron deficiency anemia, unspecified: Secondary | ICD-10-CM | POA: Diagnosis not present

## 2018-07-06 DIAGNOSIS — N186 End stage renal disease: Secondary | ICD-10-CM | POA: Diagnosis not present

## 2018-07-08 DIAGNOSIS — D509 Iron deficiency anemia, unspecified: Secondary | ICD-10-CM | POA: Diagnosis not present

## 2018-07-08 DIAGNOSIS — Z992 Dependence on renal dialysis: Secondary | ICD-10-CM | POA: Diagnosis not present

## 2018-07-08 DIAGNOSIS — N2581 Secondary hyperparathyroidism of renal origin: Secondary | ICD-10-CM | POA: Diagnosis not present

## 2018-07-08 DIAGNOSIS — N186 End stage renal disease: Secondary | ICD-10-CM | POA: Diagnosis not present

## 2018-07-10 DIAGNOSIS — D509 Iron deficiency anemia, unspecified: Secondary | ICD-10-CM | POA: Diagnosis not present

## 2018-07-10 DIAGNOSIS — N2581 Secondary hyperparathyroidism of renal origin: Secondary | ICD-10-CM | POA: Diagnosis not present

## 2018-07-10 DIAGNOSIS — Z992 Dependence on renal dialysis: Secondary | ICD-10-CM | POA: Diagnosis not present

## 2018-07-10 DIAGNOSIS — N186 End stage renal disease: Secondary | ICD-10-CM | POA: Diagnosis not present

## 2018-07-13 DIAGNOSIS — N2581 Secondary hyperparathyroidism of renal origin: Secondary | ICD-10-CM | POA: Diagnosis not present

## 2018-07-13 DIAGNOSIS — Z992 Dependence on renal dialysis: Secondary | ICD-10-CM | POA: Diagnosis not present

## 2018-07-13 DIAGNOSIS — N186 End stage renal disease: Secondary | ICD-10-CM | POA: Diagnosis not present

## 2018-07-13 DIAGNOSIS — D509 Iron deficiency anemia, unspecified: Secondary | ICD-10-CM | POA: Diagnosis not present

## 2018-07-15 DIAGNOSIS — Z992 Dependence on renal dialysis: Secondary | ICD-10-CM | POA: Diagnosis not present

## 2018-07-15 DIAGNOSIS — N186 End stage renal disease: Secondary | ICD-10-CM | POA: Diagnosis not present

## 2018-07-15 DIAGNOSIS — N2581 Secondary hyperparathyroidism of renal origin: Secondary | ICD-10-CM | POA: Diagnosis not present

## 2018-07-15 DIAGNOSIS — D509 Iron deficiency anemia, unspecified: Secondary | ICD-10-CM | POA: Diagnosis not present

## 2018-07-17 DIAGNOSIS — D509 Iron deficiency anemia, unspecified: Secondary | ICD-10-CM | POA: Diagnosis not present

## 2018-07-17 DIAGNOSIS — N186 End stage renal disease: Secondary | ICD-10-CM | POA: Diagnosis not present

## 2018-07-17 DIAGNOSIS — Z992 Dependence on renal dialysis: Secondary | ICD-10-CM | POA: Diagnosis not present

## 2018-07-17 DIAGNOSIS — N2581 Secondary hyperparathyroidism of renal origin: Secondary | ICD-10-CM | POA: Diagnosis not present

## 2018-07-20 DIAGNOSIS — D509 Iron deficiency anemia, unspecified: Secondary | ICD-10-CM | POA: Diagnosis not present

## 2018-07-20 DIAGNOSIS — N186 End stage renal disease: Secondary | ICD-10-CM | POA: Diagnosis not present

## 2018-07-20 DIAGNOSIS — N2581 Secondary hyperparathyroidism of renal origin: Secondary | ICD-10-CM | POA: Diagnosis not present

## 2018-07-20 DIAGNOSIS — Z992 Dependence on renal dialysis: Secondary | ICD-10-CM | POA: Diagnosis not present

## 2018-07-21 DIAGNOSIS — J181 Lobar pneumonia, unspecified organism: Secondary | ICD-10-CM | POA: Diagnosis not present

## 2018-07-21 DIAGNOSIS — Z1389 Encounter for screening for other disorder: Secondary | ICD-10-CM | POA: Diagnosis not present

## 2018-07-21 DIAGNOSIS — Z681 Body mass index (BMI) 19 or less, adult: Secondary | ICD-10-CM | POA: Diagnosis not present

## 2018-07-22 DIAGNOSIS — N186 End stage renal disease: Secondary | ICD-10-CM | POA: Diagnosis not present

## 2018-07-22 DIAGNOSIS — N2581 Secondary hyperparathyroidism of renal origin: Secondary | ICD-10-CM | POA: Diagnosis not present

## 2018-07-22 DIAGNOSIS — D509 Iron deficiency anemia, unspecified: Secondary | ICD-10-CM | POA: Diagnosis not present

## 2018-07-22 DIAGNOSIS — Z992 Dependence on renal dialysis: Secondary | ICD-10-CM | POA: Diagnosis not present

## 2018-07-24 DIAGNOSIS — D509 Iron deficiency anemia, unspecified: Secondary | ICD-10-CM | POA: Diagnosis not present

## 2018-07-24 DIAGNOSIS — N2581 Secondary hyperparathyroidism of renal origin: Secondary | ICD-10-CM | POA: Diagnosis not present

## 2018-07-24 DIAGNOSIS — Z992 Dependence on renal dialysis: Secondary | ICD-10-CM | POA: Diagnosis not present

## 2018-07-24 DIAGNOSIS — N186 End stage renal disease: Secondary | ICD-10-CM | POA: Diagnosis not present

## 2018-07-27 DIAGNOSIS — N186 End stage renal disease: Secondary | ICD-10-CM | POA: Diagnosis not present

## 2018-07-27 DIAGNOSIS — Z992 Dependence on renal dialysis: Secondary | ICD-10-CM | POA: Diagnosis not present

## 2018-07-27 DIAGNOSIS — N2581 Secondary hyperparathyroidism of renal origin: Secondary | ICD-10-CM | POA: Diagnosis not present

## 2018-07-27 DIAGNOSIS — D509 Iron deficiency anemia, unspecified: Secondary | ICD-10-CM | POA: Diagnosis not present

## 2018-07-29 DIAGNOSIS — N186 End stage renal disease: Secondary | ICD-10-CM | POA: Diagnosis not present

## 2018-07-29 DIAGNOSIS — D509 Iron deficiency anemia, unspecified: Secondary | ICD-10-CM | POA: Diagnosis not present

## 2018-07-29 DIAGNOSIS — N2581 Secondary hyperparathyroidism of renal origin: Secondary | ICD-10-CM | POA: Diagnosis not present

## 2018-07-29 DIAGNOSIS — Z992 Dependence on renal dialysis: Secondary | ICD-10-CM | POA: Diagnosis not present

## 2018-08-03 DIAGNOSIS — Z992 Dependence on renal dialysis: Secondary | ICD-10-CM | POA: Diagnosis not present

## 2018-08-03 DIAGNOSIS — N2581 Secondary hyperparathyroidism of renal origin: Secondary | ICD-10-CM | POA: Diagnosis not present

## 2018-08-03 DIAGNOSIS — D509 Iron deficiency anemia, unspecified: Secondary | ICD-10-CM | POA: Diagnosis not present

## 2018-08-03 DIAGNOSIS — N186 End stage renal disease: Secondary | ICD-10-CM | POA: Diagnosis not present

## 2018-08-05 DIAGNOSIS — N186 End stage renal disease: Secondary | ICD-10-CM | POA: Diagnosis not present

## 2018-08-05 DIAGNOSIS — N2581 Secondary hyperparathyroidism of renal origin: Secondary | ICD-10-CM | POA: Diagnosis not present

## 2018-08-05 DIAGNOSIS — Z992 Dependence on renal dialysis: Secondary | ICD-10-CM | POA: Diagnosis not present

## 2018-08-05 DIAGNOSIS — D509 Iron deficiency anemia, unspecified: Secondary | ICD-10-CM | POA: Diagnosis not present

## 2018-08-07 DIAGNOSIS — Z992 Dependence on renal dialysis: Secondary | ICD-10-CM | POA: Diagnosis not present

## 2018-08-07 DIAGNOSIS — N2581 Secondary hyperparathyroidism of renal origin: Secondary | ICD-10-CM | POA: Diagnosis not present

## 2018-08-07 DIAGNOSIS — D509 Iron deficiency anemia, unspecified: Secondary | ICD-10-CM | POA: Diagnosis not present

## 2018-08-07 DIAGNOSIS — N186 End stage renal disease: Secondary | ICD-10-CM | POA: Diagnosis not present

## 2018-08-10 DIAGNOSIS — D509 Iron deficiency anemia, unspecified: Secondary | ICD-10-CM | POA: Diagnosis not present

## 2018-08-10 DIAGNOSIS — N186 End stage renal disease: Secondary | ICD-10-CM | POA: Diagnosis not present

## 2018-08-10 DIAGNOSIS — N2581 Secondary hyperparathyroidism of renal origin: Secondary | ICD-10-CM | POA: Diagnosis not present

## 2018-08-10 DIAGNOSIS — Z992 Dependence on renal dialysis: Secondary | ICD-10-CM | POA: Diagnosis not present

## 2018-08-12 DIAGNOSIS — D509 Iron deficiency anemia, unspecified: Secondary | ICD-10-CM | POA: Diagnosis not present

## 2018-08-12 DIAGNOSIS — N2581 Secondary hyperparathyroidism of renal origin: Secondary | ICD-10-CM | POA: Diagnosis not present

## 2018-08-12 DIAGNOSIS — N186 End stage renal disease: Secondary | ICD-10-CM | POA: Diagnosis not present

## 2018-08-12 DIAGNOSIS — Z992 Dependence on renal dialysis: Secondary | ICD-10-CM | POA: Diagnosis not present

## 2018-08-14 DIAGNOSIS — N186 End stage renal disease: Secondary | ICD-10-CM | POA: Diagnosis not present

## 2018-08-14 DIAGNOSIS — D509 Iron deficiency anemia, unspecified: Secondary | ICD-10-CM | POA: Diagnosis not present

## 2018-08-14 DIAGNOSIS — Z992 Dependence on renal dialysis: Secondary | ICD-10-CM | POA: Diagnosis not present

## 2018-08-14 DIAGNOSIS — N2581 Secondary hyperparathyroidism of renal origin: Secondary | ICD-10-CM | POA: Diagnosis not present

## 2018-08-17 DIAGNOSIS — N186 End stage renal disease: Secondary | ICD-10-CM | POA: Diagnosis not present

## 2018-08-17 DIAGNOSIS — N2581 Secondary hyperparathyroidism of renal origin: Secondary | ICD-10-CM | POA: Diagnosis not present

## 2018-08-17 DIAGNOSIS — D509 Iron deficiency anemia, unspecified: Secondary | ICD-10-CM | POA: Diagnosis not present

## 2018-08-17 DIAGNOSIS — Z992 Dependence on renal dialysis: Secondary | ICD-10-CM | POA: Diagnosis not present

## 2018-08-19 DIAGNOSIS — N186 End stage renal disease: Secondary | ICD-10-CM | POA: Diagnosis not present

## 2018-08-19 DIAGNOSIS — Z992 Dependence on renal dialysis: Secondary | ICD-10-CM | POA: Diagnosis not present

## 2018-08-19 DIAGNOSIS — N2581 Secondary hyperparathyroidism of renal origin: Secondary | ICD-10-CM | POA: Diagnosis not present

## 2018-08-19 DIAGNOSIS — D509 Iron deficiency anemia, unspecified: Secondary | ICD-10-CM | POA: Diagnosis not present

## 2018-08-21 DIAGNOSIS — Z992 Dependence on renal dialysis: Secondary | ICD-10-CM | POA: Diagnosis not present

## 2018-08-21 DIAGNOSIS — N2581 Secondary hyperparathyroidism of renal origin: Secondary | ICD-10-CM | POA: Diagnosis not present

## 2018-08-21 DIAGNOSIS — D509 Iron deficiency anemia, unspecified: Secondary | ICD-10-CM | POA: Diagnosis not present

## 2018-08-21 DIAGNOSIS — N186 End stage renal disease: Secondary | ICD-10-CM | POA: Diagnosis not present

## 2018-08-24 DIAGNOSIS — D509 Iron deficiency anemia, unspecified: Secondary | ICD-10-CM | POA: Diagnosis not present

## 2018-08-24 DIAGNOSIS — N186 End stage renal disease: Secondary | ICD-10-CM | POA: Diagnosis not present

## 2018-08-24 DIAGNOSIS — Z992 Dependence on renal dialysis: Secondary | ICD-10-CM | POA: Diagnosis not present

## 2018-08-24 DIAGNOSIS — N2581 Secondary hyperparathyroidism of renal origin: Secondary | ICD-10-CM | POA: Diagnosis not present

## 2018-08-26 DIAGNOSIS — N2581 Secondary hyperparathyroidism of renal origin: Secondary | ICD-10-CM | POA: Diagnosis not present

## 2018-08-26 DIAGNOSIS — Z992 Dependence on renal dialysis: Secondary | ICD-10-CM | POA: Diagnosis not present

## 2018-08-26 DIAGNOSIS — D509 Iron deficiency anemia, unspecified: Secondary | ICD-10-CM | POA: Diagnosis not present

## 2018-08-26 DIAGNOSIS — N186 End stage renal disease: Secondary | ICD-10-CM | POA: Diagnosis not present

## 2018-08-28 DIAGNOSIS — N2581 Secondary hyperparathyroidism of renal origin: Secondary | ICD-10-CM | POA: Diagnosis not present

## 2018-08-28 DIAGNOSIS — Z992 Dependence on renal dialysis: Secondary | ICD-10-CM | POA: Diagnosis not present

## 2018-08-28 DIAGNOSIS — N186 End stage renal disease: Secondary | ICD-10-CM | POA: Diagnosis not present

## 2018-08-28 DIAGNOSIS — D509 Iron deficiency anemia, unspecified: Secondary | ICD-10-CM | POA: Diagnosis not present

## 2018-08-31 DIAGNOSIS — N2581 Secondary hyperparathyroidism of renal origin: Secondary | ICD-10-CM | POA: Diagnosis not present

## 2018-08-31 DIAGNOSIS — D509 Iron deficiency anemia, unspecified: Secondary | ICD-10-CM | POA: Diagnosis not present

## 2018-08-31 DIAGNOSIS — N186 End stage renal disease: Secondary | ICD-10-CM | POA: Diagnosis not present

## 2018-08-31 DIAGNOSIS — Z992 Dependence on renal dialysis: Secondary | ICD-10-CM | POA: Diagnosis not present

## 2018-09-02 DIAGNOSIS — Z992 Dependence on renal dialysis: Secondary | ICD-10-CM | POA: Diagnosis not present

## 2018-09-02 DIAGNOSIS — N2581 Secondary hyperparathyroidism of renal origin: Secondary | ICD-10-CM | POA: Diagnosis not present

## 2018-09-02 DIAGNOSIS — D509 Iron deficiency anemia, unspecified: Secondary | ICD-10-CM | POA: Diagnosis not present

## 2018-09-02 DIAGNOSIS — N186 End stage renal disease: Secondary | ICD-10-CM | POA: Diagnosis not present

## 2018-09-04 DIAGNOSIS — D509 Iron deficiency anemia, unspecified: Secondary | ICD-10-CM | POA: Diagnosis not present

## 2018-09-04 DIAGNOSIS — Z992 Dependence on renal dialysis: Secondary | ICD-10-CM | POA: Diagnosis not present

## 2018-09-04 DIAGNOSIS — N186 End stage renal disease: Secondary | ICD-10-CM | POA: Diagnosis not present

## 2018-09-04 DIAGNOSIS — N2581 Secondary hyperparathyroidism of renal origin: Secondary | ICD-10-CM | POA: Diagnosis not present

## 2018-09-07 DIAGNOSIS — N2581 Secondary hyperparathyroidism of renal origin: Secondary | ICD-10-CM | POA: Diagnosis not present

## 2018-09-07 DIAGNOSIS — D509 Iron deficiency anemia, unspecified: Secondary | ICD-10-CM | POA: Diagnosis not present

## 2018-09-07 DIAGNOSIS — Z992 Dependence on renal dialysis: Secondary | ICD-10-CM | POA: Diagnosis not present

## 2018-09-07 DIAGNOSIS — N186 End stage renal disease: Secondary | ICD-10-CM | POA: Diagnosis not present

## 2018-09-09 DIAGNOSIS — D509 Iron deficiency anemia, unspecified: Secondary | ICD-10-CM | POA: Diagnosis not present

## 2018-09-09 DIAGNOSIS — N186 End stage renal disease: Secondary | ICD-10-CM | POA: Diagnosis not present

## 2018-09-09 DIAGNOSIS — Z992 Dependence on renal dialysis: Secondary | ICD-10-CM | POA: Diagnosis not present

## 2018-09-09 DIAGNOSIS — N2581 Secondary hyperparathyroidism of renal origin: Secondary | ICD-10-CM | POA: Diagnosis not present

## 2018-09-11 DIAGNOSIS — D509 Iron deficiency anemia, unspecified: Secondary | ICD-10-CM | POA: Diagnosis not present

## 2018-09-11 DIAGNOSIS — N2581 Secondary hyperparathyroidism of renal origin: Secondary | ICD-10-CM | POA: Diagnosis not present

## 2018-09-11 DIAGNOSIS — N186 End stage renal disease: Secondary | ICD-10-CM | POA: Diagnosis not present

## 2018-09-11 DIAGNOSIS — Z992 Dependence on renal dialysis: Secondary | ICD-10-CM | POA: Diagnosis not present

## 2018-09-14 DIAGNOSIS — N186 End stage renal disease: Secondary | ICD-10-CM | POA: Diagnosis not present

## 2018-09-14 DIAGNOSIS — N2581 Secondary hyperparathyroidism of renal origin: Secondary | ICD-10-CM | POA: Diagnosis not present

## 2018-09-14 DIAGNOSIS — D509 Iron deficiency anemia, unspecified: Secondary | ICD-10-CM | POA: Diagnosis not present

## 2018-09-14 DIAGNOSIS — Z992 Dependence on renal dialysis: Secondary | ICD-10-CM | POA: Diagnosis not present

## 2018-09-16 DIAGNOSIS — N186 End stage renal disease: Secondary | ICD-10-CM | POA: Diagnosis not present

## 2018-09-16 DIAGNOSIS — D509 Iron deficiency anemia, unspecified: Secondary | ICD-10-CM | POA: Diagnosis not present

## 2018-09-16 DIAGNOSIS — Z992 Dependence on renal dialysis: Secondary | ICD-10-CM | POA: Diagnosis not present

## 2018-09-16 DIAGNOSIS — N2581 Secondary hyperparathyroidism of renal origin: Secondary | ICD-10-CM | POA: Diagnosis not present

## 2018-09-18 DIAGNOSIS — N186 End stage renal disease: Secondary | ICD-10-CM | POA: Diagnosis not present

## 2018-09-18 DIAGNOSIS — Z992 Dependence on renal dialysis: Secondary | ICD-10-CM | POA: Diagnosis not present

## 2018-09-18 DIAGNOSIS — N2581 Secondary hyperparathyroidism of renal origin: Secondary | ICD-10-CM | POA: Diagnosis not present

## 2018-09-18 DIAGNOSIS — D509 Iron deficiency anemia, unspecified: Secondary | ICD-10-CM | POA: Diagnosis not present

## 2018-09-21 DIAGNOSIS — N186 End stage renal disease: Secondary | ICD-10-CM | POA: Diagnosis not present

## 2018-09-21 DIAGNOSIS — D509 Iron deficiency anemia, unspecified: Secondary | ICD-10-CM | POA: Diagnosis not present

## 2018-09-21 DIAGNOSIS — N2581 Secondary hyperparathyroidism of renal origin: Secondary | ICD-10-CM | POA: Diagnosis not present

## 2018-09-21 DIAGNOSIS — Z992 Dependence on renal dialysis: Secondary | ICD-10-CM | POA: Diagnosis not present

## 2018-09-23 DIAGNOSIS — N2581 Secondary hyperparathyroidism of renal origin: Secondary | ICD-10-CM | POA: Diagnosis not present

## 2018-09-23 DIAGNOSIS — N186 End stage renal disease: Secondary | ICD-10-CM | POA: Diagnosis not present

## 2018-09-23 DIAGNOSIS — D509 Iron deficiency anemia, unspecified: Secondary | ICD-10-CM | POA: Diagnosis not present

## 2018-09-23 DIAGNOSIS — Z992 Dependence on renal dialysis: Secondary | ICD-10-CM | POA: Diagnosis not present

## 2018-09-25 DIAGNOSIS — D509 Iron deficiency anemia, unspecified: Secondary | ICD-10-CM | POA: Diagnosis not present

## 2018-09-25 DIAGNOSIS — Z992 Dependence on renal dialysis: Secondary | ICD-10-CM | POA: Diagnosis not present

## 2018-09-25 DIAGNOSIS — N186 End stage renal disease: Secondary | ICD-10-CM | POA: Diagnosis not present

## 2018-09-25 DIAGNOSIS — N2581 Secondary hyperparathyroidism of renal origin: Secondary | ICD-10-CM | POA: Diagnosis not present

## 2018-09-28 DIAGNOSIS — N186 End stage renal disease: Secondary | ICD-10-CM | POA: Diagnosis not present

## 2018-09-28 DIAGNOSIS — D509 Iron deficiency anemia, unspecified: Secondary | ICD-10-CM | POA: Diagnosis not present

## 2018-09-28 DIAGNOSIS — Z992 Dependence on renal dialysis: Secondary | ICD-10-CM | POA: Diagnosis not present

## 2018-09-28 DIAGNOSIS — N2581 Secondary hyperparathyroidism of renal origin: Secondary | ICD-10-CM | POA: Diagnosis not present

## 2018-10-02 DIAGNOSIS — N2581 Secondary hyperparathyroidism of renal origin: Secondary | ICD-10-CM | POA: Diagnosis not present

## 2018-10-02 DIAGNOSIS — Z992 Dependence on renal dialysis: Secondary | ICD-10-CM | POA: Diagnosis not present

## 2018-10-02 DIAGNOSIS — D509 Iron deficiency anemia, unspecified: Secondary | ICD-10-CM | POA: Diagnosis not present

## 2018-10-02 DIAGNOSIS — N186 End stage renal disease: Secondary | ICD-10-CM | POA: Diagnosis not present

## 2018-10-05 DIAGNOSIS — Z992 Dependence on renal dialysis: Secondary | ICD-10-CM | POA: Diagnosis not present

## 2018-10-05 DIAGNOSIS — D509 Iron deficiency anemia, unspecified: Secondary | ICD-10-CM | POA: Diagnosis not present

## 2018-10-05 DIAGNOSIS — N2581 Secondary hyperparathyroidism of renal origin: Secondary | ICD-10-CM | POA: Diagnosis not present

## 2018-10-05 DIAGNOSIS — N186 End stage renal disease: Secondary | ICD-10-CM | POA: Diagnosis not present

## 2018-10-07 DIAGNOSIS — N186 End stage renal disease: Secondary | ICD-10-CM | POA: Diagnosis not present

## 2018-10-07 DIAGNOSIS — Z992 Dependence on renal dialysis: Secondary | ICD-10-CM | POA: Diagnosis not present

## 2018-10-07 DIAGNOSIS — D509 Iron deficiency anemia, unspecified: Secondary | ICD-10-CM | POA: Diagnosis not present

## 2018-10-07 DIAGNOSIS — N2581 Secondary hyperparathyroidism of renal origin: Secondary | ICD-10-CM | POA: Diagnosis not present

## 2018-10-09 DIAGNOSIS — D509 Iron deficiency anemia, unspecified: Secondary | ICD-10-CM | POA: Diagnosis not present

## 2018-10-09 DIAGNOSIS — N2581 Secondary hyperparathyroidism of renal origin: Secondary | ICD-10-CM | POA: Diagnosis not present

## 2018-10-09 DIAGNOSIS — Z992 Dependence on renal dialysis: Secondary | ICD-10-CM | POA: Diagnosis not present

## 2018-10-09 DIAGNOSIS — N186 End stage renal disease: Secondary | ICD-10-CM | POA: Diagnosis not present

## 2018-10-10 DIAGNOSIS — N186 End stage renal disease: Secondary | ICD-10-CM | POA: Diagnosis not present

## 2018-10-10 DIAGNOSIS — Z992 Dependence on renal dialysis: Secondary | ICD-10-CM | POA: Diagnosis not present

## 2018-10-12 DIAGNOSIS — N186 End stage renal disease: Secondary | ICD-10-CM | POA: Diagnosis not present

## 2018-10-12 DIAGNOSIS — Z992 Dependence on renal dialysis: Secondary | ICD-10-CM | POA: Diagnosis not present

## 2018-10-12 DIAGNOSIS — N2581 Secondary hyperparathyroidism of renal origin: Secondary | ICD-10-CM | POA: Diagnosis not present

## 2018-10-12 DIAGNOSIS — D509 Iron deficiency anemia, unspecified: Secondary | ICD-10-CM | POA: Diagnosis not present

## 2018-10-14 DIAGNOSIS — D509 Iron deficiency anemia, unspecified: Secondary | ICD-10-CM | POA: Diagnosis not present

## 2018-10-14 DIAGNOSIS — N2581 Secondary hyperparathyroidism of renal origin: Secondary | ICD-10-CM | POA: Diagnosis not present

## 2018-10-14 DIAGNOSIS — N186 End stage renal disease: Secondary | ICD-10-CM | POA: Diagnosis not present

## 2018-10-14 DIAGNOSIS — Z992 Dependence on renal dialysis: Secondary | ICD-10-CM | POA: Diagnosis not present

## 2018-10-16 DIAGNOSIS — D509 Iron deficiency anemia, unspecified: Secondary | ICD-10-CM | POA: Diagnosis not present

## 2018-10-16 DIAGNOSIS — Z992 Dependence on renal dialysis: Secondary | ICD-10-CM | POA: Diagnosis not present

## 2018-10-16 DIAGNOSIS — N186 End stage renal disease: Secondary | ICD-10-CM | POA: Diagnosis not present

## 2018-10-16 DIAGNOSIS — N2581 Secondary hyperparathyroidism of renal origin: Secondary | ICD-10-CM | POA: Diagnosis not present

## 2018-10-19 DIAGNOSIS — N2581 Secondary hyperparathyroidism of renal origin: Secondary | ICD-10-CM | POA: Diagnosis not present

## 2018-10-19 DIAGNOSIS — D509 Iron deficiency anemia, unspecified: Secondary | ICD-10-CM | POA: Diagnosis not present

## 2018-10-19 DIAGNOSIS — N186 End stage renal disease: Secondary | ICD-10-CM | POA: Diagnosis not present

## 2018-10-19 DIAGNOSIS — Z992 Dependence on renal dialysis: Secondary | ICD-10-CM | POA: Diagnosis not present

## 2018-10-21 DIAGNOSIS — D509 Iron deficiency anemia, unspecified: Secondary | ICD-10-CM | POA: Diagnosis not present

## 2018-10-21 DIAGNOSIS — Z992 Dependence on renal dialysis: Secondary | ICD-10-CM | POA: Diagnosis not present

## 2018-10-21 DIAGNOSIS — N186 End stage renal disease: Secondary | ICD-10-CM | POA: Diagnosis not present

## 2018-10-21 DIAGNOSIS — N2581 Secondary hyperparathyroidism of renal origin: Secondary | ICD-10-CM | POA: Diagnosis not present

## 2018-10-23 DIAGNOSIS — N186 End stage renal disease: Secondary | ICD-10-CM | POA: Diagnosis not present

## 2018-10-23 DIAGNOSIS — Z992 Dependence on renal dialysis: Secondary | ICD-10-CM | POA: Diagnosis not present

## 2018-10-23 DIAGNOSIS — D509 Iron deficiency anemia, unspecified: Secondary | ICD-10-CM | POA: Diagnosis not present

## 2018-10-23 DIAGNOSIS — N2581 Secondary hyperparathyroidism of renal origin: Secondary | ICD-10-CM | POA: Diagnosis not present

## 2018-10-26 DIAGNOSIS — N186 End stage renal disease: Secondary | ICD-10-CM | POA: Diagnosis not present

## 2018-10-26 DIAGNOSIS — N2581 Secondary hyperparathyroidism of renal origin: Secondary | ICD-10-CM | POA: Diagnosis not present

## 2018-10-26 DIAGNOSIS — Z992 Dependence on renal dialysis: Secondary | ICD-10-CM | POA: Diagnosis not present

## 2018-10-26 DIAGNOSIS — D509 Iron deficiency anemia, unspecified: Secondary | ICD-10-CM | POA: Diagnosis not present

## 2018-10-28 DIAGNOSIS — D509 Iron deficiency anemia, unspecified: Secondary | ICD-10-CM | POA: Diagnosis not present

## 2018-10-28 DIAGNOSIS — Z992 Dependence on renal dialysis: Secondary | ICD-10-CM | POA: Diagnosis not present

## 2018-10-28 DIAGNOSIS — N186 End stage renal disease: Secondary | ICD-10-CM | POA: Diagnosis not present

## 2018-10-28 DIAGNOSIS — N2581 Secondary hyperparathyroidism of renal origin: Secondary | ICD-10-CM | POA: Diagnosis not present

## 2018-10-30 DIAGNOSIS — Z992 Dependence on renal dialysis: Secondary | ICD-10-CM | POA: Diagnosis not present

## 2018-10-30 DIAGNOSIS — N186 End stage renal disease: Secondary | ICD-10-CM | POA: Diagnosis not present

## 2018-10-30 DIAGNOSIS — N2581 Secondary hyperparathyroidism of renal origin: Secondary | ICD-10-CM | POA: Diagnosis not present

## 2018-10-30 DIAGNOSIS — D509 Iron deficiency anemia, unspecified: Secondary | ICD-10-CM | POA: Diagnosis not present

## 2018-11-02 DIAGNOSIS — N2581 Secondary hyperparathyroidism of renal origin: Secondary | ICD-10-CM | POA: Diagnosis not present

## 2018-11-02 DIAGNOSIS — D509 Iron deficiency anemia, unspecified: Secondary | ICD-10-CM | POA: Diagnosis not present

## 2018-11-02 DIAGNOSIS — N186 End stage renal disease: Secondary | ICD-10-CM | POA: Diagnosis not present

## 2018-11-02 DIAGNOSIS — Z992 Dependence on renal dialysis: Secondary | ICD-10-CM | POA: Diagnosis not present

## 2018-11-04 DIAGNOSIS — N2581 Secondary hyperparathyroidism of renal origin: Secondary | ICD-10-CM | POA: Diagnosis not present

## 2018-11-04 DIAGNOSIS — D509 Iron deficiency anemia, unspecified: Secondary | ICD-10-CM | POA: Diagnosis not present

## 2018-11-04 DIAGNOSIS — N186 End stage renal disease: Secondary | ICD-10-CM | POA: Diagnosis not present

## 2018-11-04 DIAGNOSIS — Z992 Dependence on renal dialysis: Secondary | ICD-10-CM | POA: Diagnosis not present

## 2018-11-06 DIAGNOSIS — Z992 Dependence on renal dialysis: Secondary | ICD-10-CM | POA: Diagnosis not present

## 2018-11-06 DIAGNOSIS — D509 Iron deficiency anemia, unspecified: Secondary | ICD-10-CM | POA: Diagnosis not present

## 2018-11-06 DIAGNOSIS — N186 End stage renal disease: Secondary | ICD-10-CM | POA: Diagnosis not present

## 2018-11-06 DIAGNOSIS — N2581 Secondary hyperparathyroidism of renal origin: Secondary | ICD-10-CM | POA: Diagnosis not present

## 2018-11-09 DIAGNOSIS — N186 End stage renal disease: Secondary | ICD-10-CM | POA: Diagnosis not present

## 2018-11-09 DIAGNOSIS — Z992 Dependence on renal dialysis: Secondary | ICD-10-CM | POA: Diagnosis not present

## 2018-11-09 DIAGNOSIS — D509 Iron deficiency anemia, unspecified: Secondary | ICD-10-CM | POA: Diagnosis not present

## 2018-11-09 DIAGNOSIS — N2581 Secondary hyperparathyroidism of renal origin: Secondary | ICD-10-CM | POA: Diagnosis not present

## 2018-11-11 DIAGNOSIS — N2581 Secondary hyperparathyroidism of renal origin: Secondary | ICD-10-CM | POA: Diagnosis not present

## 2018-11-11 DIAGNOSIS — Z992 Dependence on renal dialysis: Secondary | ICD-10-CM | POA: Diagnosis not present

## 2018-11-11 DIAGNOSIS — N186 End stage renal disease: Secondary | ICD-10-CM | POA: Diagnosis not present

## 2018-11-11 DIAGNOSIS — D509 Iron deficiency anemia, unspecified: Secondary | ICD-10-CM | POA: Diagnosis not present

## 2018-11-13 DIAGNOSIS — Z992 Dependence on renal dialysis: Secondary | ICD-10-CM | POA: Diagnosis not present

## 2018-11-13 DIAGNOSIS — D509 Iron deficiency anemia, unspecified: Secondary | ICD-10-CM | POA: Diagnosis not present

## 2018-11-13 DIAGNOSIS — N186 End stage renal disease: Secondary | ICD-10-CM | POA: Diagnosis not present

## 2018-11-13 DIAGNOSIS — N2581 Secondary hyperparathyroidism of renal origin: Secondary | ICD-10-CM | POA: Diagnosis not present

## 2018-11-16 DIAGNOSIS — D509 Iron deficiency anemia, unspecified: Secondary | ICD-10-CM | POA: Diagnosis not present

## 2018-11-16 DIAGNOSIS — N2581 Secondary hyperparathyroidism of renal origin: Secondary | ICD-10-CM | POA: Diagnosis not present

## 2018-11-16 DIAGNOSIS — N186 End stage renal disease: Secondary | ICD-10-CM | POA: Diagnosis not present

## 2018-11-16 DIAGNOSIS — Z992 Dependence on renal dialysis: Secondary | ICD-10-CM | POA: Diagnosis not present

## 2018-11-18 DIAGNOSIS — N2581 Secondary hyperparathyroidism of renal origin: Secondary | ICD-10-CM | POA: Diagnosis not present

## 2018-11-18 DIAGNOSIS — N186 End stage renal disease: Secondary | ICD-10-CM | POA: Diagnosis not present

## 2018-11-18 DIAGNOSIS — Z992 Dependence on renal dialysis: Secondary | ICD-10-CM | POA: Diagnosis not present

## 2018-11-18 DIAGNOSIS — D509 Iron deficiency anemia, unspecified: Secondary | ICD-10-CM | POA: Diagnosis not present

## 2018-11-20 DIAGNOSIS — N2581 Secondary hyperparathyroidism of renal origin: Secondary | ICD-10-CM | POA: Diagnosis not present

## 2018-11-20 DIAGNOSIS — Z992 Dependence on renal dialysis: Secondary | ICD-10-CM | POA: Diagnosis not present

## 2018-11-20 DIAGNOSIS — D509 Iron deficiency anemia, unspecified: Secondary | ICD-10-CM | POA: Diagnosis not present

## 2018-11-20 DIAGNOSIS — N186 End stage renal disease: Secondary | ICD-10-CM | POA: Diagnosis not present

## 2018-11-23 DIAGNOSIS — D509 Iron deficiency anemia, unspecified: Secondary | ICD-10-CM | POA: Diagnosis not present

## 2018-11-23 DIAGNOSIS — Z992 Dependence on renal dialysis: Secondary | ICD-10-CM | POA: Diagnosis not present

## 2018-11-23 DIAGNOSIS — N186 End stage renal disease: Secondary | ICD-10-CM | POA: Diagnosis not present

## 2018-11-23 DIAGNOSIS — N2581 Secondary hyperparathyroidism of renal origin: Secondary | ICD-10-CM | POA: Diagnosis not present

## 2018-11-25 DIAGNOSIS — N2581 Secondary hyperparathyroidism of renal origin: Secondary | ICD-10-CM | POA: Diagnosis not present

## 2018-11-25 DIAGNOSIS — N186 End stage renal disease: Secondary | ICD-10-CM | POA: Diagnosis not present

## 2018-11-25 DIAGNOSIS — Z992 Dependence on renal dialysis: Secondary | ICD-10-CM | POA: Diagnosis not present

## 2018-11-25 DIAGNOSIS — D509 Iron deficiency anemia, unspecified: Secondary | ICD-10-CM | POA: Diagnosis not present

## 2018-11-27 DIAGNOSIS — Z992 Dependence on renal dialysis: Secondary | ICD-10-CM | POA: Diagnosis not present

## 2018-11-27 DIAGNOSIS — N186 End stage renal disease: Secondary | ICD-10-CM | POA: Diagnosis not present

## 2018-11-27 DIAGNOSIS — D509 Iron deficiency anemia, unspecified: Secondary | ICD-10-CM | POA: Diagnosis not present

## 2018-11-27 DIAGNOSIS — N2581 Secondary hyperparathyroidism of renal origin: Secondary | ICD-10-CM | POA: Diagnosis not present

## 2018-11-30 DIAGNOSIS — D509 Iron deficiency anemia, unspecified: Secondary | ICD-10-CM | POA: Diagnosis not present

## 2018-11-30 DIAGNOSIS — Z992 Dependence on renal dialysis: Secondary | ICD-10-CM | POA: Diagnosis not present

## 2018-11-30 DIAGNOSIS — N2581 Secondary hyperparathyroidism of renal origin: Secondary | ICD-10-CM | POA: Diagnosis not present

## 2018-11-30 DIAGNOSIS — N186 End stage renal disease: Secondary | ICD-10-CM | POA: Diagnosis not present

## 2018-12-02 DIAGNOSIS — D509 Iron deficiency anemia, unspecified: Secondary | ICD-10-CM | POA: Diagnosis not present

## 2018-12-02 DIAGNOSIS — Z992 Dependence on renal dialysis: Secondary | ICD-10-CM | POA: Diagnosis not present

## 2018-12-02 DIAGNOSIS — N2581 Secondary hyperparathyroidism of renal origin: Secondary | ICD-10-CM | POA: Diagnosis not present

## 2018-12-02 DIAGNOSIS — N186 End stage renal disease: Secondary | ICD-10-CM | POA: Diagnosis not present

## 2018-12-04 DIAGNOSIS — Z992 Dependence on renal dialysis: Secondary | ICD-10-CM | POA: Diagnosis not present

## 2018-12-04 DIAGNOSIS — N2581 Secondary hyperparathyroidism of renal origin: Secondary | ICD-10-CM | POA: Diagnosis not present

## 2018-12-04 DIAGNOSIS — N186 End stage renal disease: Secondary | ICD-10-CM | POA: Diagnosis not present

## 2018-12-04 DIAGNOSIS — D509 Iron deficiency anemia, unspecified: Secondary | ICD-10-CM | POA: Diagnosis not present

## 2018-12-07 DIAGNOSIS — Z992 Dependence on renal dialysis: Secondary | ICD-10-CM | POA: Diagnosis not present

## 2018-12-07 DIAGNOSIS — N2581 Secondary hyperparathyroidism of renal origin: Secondary | ICD-10-CM | POA: Diagnosis not present

## 2018-12-07 DIAGNOSIS — N186 End stage renal disease: Secondary | ICD-10-CM | POA: Diagnosis not present

## 2018-12-07 DIAGNOSIS — D509 Iron deficiency anemia, unspecified: Secondary | ICD-10-CM | POA: Diagnosis not present

## 2018-12-09 DIAGNOSIS — N2581 Secondary hyperparathyroidism of renal origin: Secondary | ICD-10-CM | POA: Diagnosis not present

## 2018-12-09 DIAGNOSIS — Z992 Dependence on renal dialysis: Secondary | ICD-10-CM | POA: Diagnosis not present

## 2018-12-09 DIAGNOSIS — N186 End stage renal disease: Secondary | ICD-10-CM | POA: Diagnosis not present

## 2018-12-09 DIAGNOSIS — D509 Iron deficiency anemia, unspecified: Secondary | ICD-10-CM | POA: Diagnosis not present

## 2018-12-10 DIAGNOSIS — Z992 Dependence on renal dialysis: Secondary | ICD-10-CM | POA: Diagnosis not present

## 2018-12-10 DIAGNOSIS — N186 End stage renal disease: Secondary | ICD-10-CM | POA: Diagnosis not present

## 2018-12-11 DIAGNOSIS — N186 End stage renal disease: Secondary | ICD-10-CM | POA: Diagnosis not present

## 2018-12-11 DIAGNOSIS — Z992 Dependence on renal dialysis: Secondary | ICD-10-CM | POA: Diagnosis not present

## 2018-12-11 DIAGNOSIS — N2581 Secondary hyperparathyroidism of renal origin: Secondary | ICD-10-CM | POA: Diagnosis not present

## 2018-12-11 DIAGNOSIS — D509 Iron deficiency anemia, unspecified: Secondary | ICD-10-CM | POA: Diagnosis not present

## 2018-12-14 DIAGNOSIS — D509 Iron deficiency anemia, unspecified: Secondary | ICD-10-CM | POA: Diagnosis not present

## 2018-12-14 DIAGNOSIS — N2581 Secondary hyperparathyroidism of renal origin: Secondary | ICD-10-CM | POA: Diagnosis not present

## 2018-12-14 DIAGNOSIS — N186 End stage renal disease: Secondary | ICD-10-CM | POA: Diagnosis not present

## 2018-12-14 DIAGNOSIS — Z992 Dependence on renal dialysis: Secondary | ICD-10-CM | POA: Diagnosis not present

## 2018-12-16 DIAGNOSIS — D509 Iron deficiency anemia, unspecified: Secondary | ICD-10-CM | POA: Diagnosis not present

## 2018-12-16 DIAGNOSIS — N2581 Secondary hyperparathyroidism of renal origin: Secondary | ICD-10-CM | POA: Diagnosis not present

## 2018-12-16 DIAGNOSIS — Z992 Dependence on renal dialysis: Secondary | ICD-10-CM | POA: Diagnosis not present

## 2018-12-16 DIAGNOSIS — N186 End stage renal disease: Secondary | ICD-10-CM | POA: Diagnosis not present

## 2018-12-18 DIAGNOSIS — D509 Iron deficiency anemia, unspecified: Secondary | ICD-10-CM | POA: Diagnosis not present

## 2018-12-18 DIAGNOSIS — N2581 Secondary hyperparathyroidism of renal origin: Secondary | ICD-10-CM | POA: Diagnosis not present

## 2018-12-18 DIAGNOSIS — Z992 Dependence on renal dialysis: Secondary | ICD-10-CM | POA: Diagnosis not present

## 2018-12-18 DIAGNOSIS — N186 End stage renal disease: Secondary | ICD-10-CM | POA: Diagnosis not present

## 2018-12-21 DIAGNOSIS — D509 Iron deficiency anemia, unspecified: Secondary | ICD-10-CM | POA: Diagnosis not present

## 2018-12-21 DIAGNOSIS — N186 End stage renal disease: Secondary | ICD-10-CM | POA: Diagnosis not present

## 2018-12-21 DIAGNOSIS — Z992 Dependence on renal dialysis: Secondary | ICD-10-CM | POA: Diagnosis not present

## 2018-12-21 DIAGNOSIS — N2581 Secondary hyperparathyroidism of renal origin: Secondary | ICD-10-CM | POA: Diagnosis not present

## 2018-12-25 DIAGNOSIS — D509 Iron deficiency anemia, unspecified: Secondary | ICD-10-CM | POA: Diagnosis not present

## 2018-12-25 DIAGNOSIS — Z992 Dependence on renal dialysis: Secondary | ICD-10-CM | POA: Diagnosis not present

## 2018-12-25 DIAGNOSIS — N2581 Secondary hyperparathyroidism of renal origin: Secondary | ICD-10-CM | POA: Diagnosis not present

## 2018-12-25 DIAGNOSIS — N186 End stage renal disease: Secondary | ICD-10-CM | POA: Diagnosis not present

## 2018-12-28 DIAGNOSIS — Z992 Dependence on renal dialysis: Secondary | ICD-10-CM | POA: Diagnosis not present

## 2018-12-28 DIAGNOSIS — N2581 Secondary hyperparathyroidism of renal origin: Secondary | ICD-10-CM | POA: Diagnosis not present

## 2018-12-28 DIAGNOSIS — N186 End stage renal disease: Secondary | ICD-10-CM | POA: Diagnosis not present

## 2018-12-28 DIAGNOSIS — D509 Iron deficiency anemia, unspecified: Secondary | ICD-10-CM | POA: Diagnosis not present

## 2018-12-30 DIAGNOSIS — N2581 Secondary hyperparathyroidism of renal origin: Secondary | ICD-10-CM | POA: Diagnosis not present

## 2018-12-30 DIAGNOSIS — D509 Iron deficiency anemia, unspecified: Secondary | ICD-10-CM | POA: Diagnosis not present

## 2018-12-30 DIAGNOSIS — Z992 Dependence on renal dialysis: Secondary | ICD-10-CM | POA: Diagnosis not present

## 2018-12-30 DIAGNOSIS — N186 End stage renal disease: Secondary | ICD-10-CM | POA: Diagnosis not present

## 2019-01-01 DIAGNOSIS — Z992 Dependence on renal dialysis: Secondary | ICD-10-CM | POA: Diagnosis not present

## 2019-01-01 DIAGNOSIS — N2581 Secondary hyperparathyroidism of renal origin: Secondary | ICD-10-CM | POA: Diagnosis not present

## 2019-01-01 DIAGNOSIS — N186 End stage renal disease: Secondary | ICD-10-CM | POA: Diagnosis not present

## 2019-01-01 DIAGNOSIS — D509 Iron deficiency anemia, unspecified: Secondary | ICD-10-CM | POA: Diagnosis not present

## 2019-01-04 DIAGNOSIS — N186 End stage renal disease: Secondary | ICD-10-CM | POA: Diagnosis not present

## 2019-01-04 DIAGNOSIS — N2581 Secondary hyperparathyroidism of renal origin: Secondary | ICD-10-CM | POA: Diagnosis not present

## 2019-01-04 DIAGNOSIS — Z992 Dependence on renal dialysis: Secondary | ICD-10-CM | POA: Diagnosis not present

## 2019-01-04 DIAGNOSIS — D509 Iron deficiency anemia, unspecified: Secondary | ICD-10-CM | POA: Diagnosis not present

## 2019-01-06 DIAGNOSIS — D509 Iron deficiency anemia, unspecified: Secondary | ICD-10-CM | POA: Diagnosis not present

## 2019-01-06 DIAGNOSIS — N186 End stage renal disease: Secondary | ICD-10-CM | POA: Diagnosis not present

## 2019-01-06 DIAGNOSIS — N2581 Secondary hyperparathyroidism of renal origin: Secondary | ICD-10-CM | POA: Diagnosis not present

## 2019-01-06 DIAGNOSIS — Z992 Dependence on renal dialysis: Secondary | ICD-10-CM | POA: Diagnosis not present

## 2019-01-08 DIAGNOSIS — N186 End stage renal disease: Secondary | ICD-10-CM | POA: Diagnosis not present

## 2019-01-08 DIAGNOSIS — D509 Iron deficiency anemia, unspecified: Secondary | ICD-10-CM | POA: Diagnosis not present

## 2019-01-08 DIAGNOSIS — Z992 Dependence on renal dialysis: Secondary | ICD-10-CM | POA: Diagnosis not present

## 2019-01-08 DIAGNOSIS — N2581 Secondary hyperparathyroidism of renal origin: Secondary | ICD-10-CM | POA: Diagnosis not present

## 2019-01-10 DIAGNOSIS — N186 End stage renal disease: Secondary | ICD-10-CM | POA: Diagnosis not present

## 2019-01-10 DIAGNOSIS — Z992 Dependence on renal dialysis: Secondary | ICD-10-CM | POA: Diagnosis not present

## 2019-01-11 DIAGNOSIS — D509 Iron deficiency anemia, unspecified: Secondary | ICD-10-CM | POA: Diagnosis not present

## 2019-01-11 DIAGNOSIS — N186 End stage renal disease: Secondary | ICD-10-CM | POA: Diagnosis not present

## 2019-01-11 DIAGNOSIS — Z992 Dependence on renal dialysis: Secondary | ICD-10-CM | POA: Diagnosis not present

## 2019-01-11 DIAGNOSIS — N2581 Secondary hyperparathyroidism of renal origin: Secondary | ICD-10-CM | POA: Diagnosis not present

## 2019-01-13 DIAGNOSIS — N2581 Secondary hyperparathyroidism of renal origin: Secondary | ICD-10-CM | POA: Diagnosis not present

## 2019-01-13 DIAGNOSIS — Z992 Dependence on renal dialysis: Secondary | ICD-10-CM | POA: Diagnosis not present

## 2019-01-13 DIAGNOSIS — D509 Iron deficiency anemia, unspecified: Secondary | ICD-10-CM | POA: Diagnosis not present

## 2019-01-13 DIAGNOSIS — N186 End stage renal disease: Secondary | ICD-10-CM | POA: Diagnosis not present

## 2019-01-15 DIAGNOSIS — N186 End stage renal disease: Secondary | ICD-10-CM | POA: Diagnosis not present

## 2019-01-15 DIAGNOSIS — Z992 Dependence on renal dialysis: Secondary | ICD-10-CM | POA: Diagnosis not present

## 2019-01-15 DIAGNOSIS — N2581 Secondary hyperparathyroidism of renal origin: Secondary | ICD-10-CM | POA: Diagnosis not present

## 2019-01-15 DIAGNOSIS — D509 Iron deficiency anemia, unspecified: Secondary | ICD-10-CM | POA: Diagnosis not present

## 2019-01-18 DIAGNOSIS — D509 Iron deficiency anemia, unspecified: Secondary | ICD-10-CM | POA: Diagnosis not present

## 2019-01-18 DIAGNOSIS — N2581 Secondary hyperparathyroidism of renal origin: Secondary | ICD-10-CM | POA: Diagnosis not present

## 2019-01-18 DIAGNOSIS — Z992 Dependence on renal dialysis: Secondary | ICD-10-CM | POA: Diagnosis not present

## 2019-01-18 DIAGNOSIS — N186 End stage renal disease: Secondary | ICD-10-CM | POA: Diagnosis not present

## 2019-01-20 DIAGNOSIS — Z992 Dependence on renal dialysis: Secondary | ICD-10-CM | POA: Diagnosis not present

## 2019-01-20 DIAGNOSIS — D509 Iron deficiency anemia, unspecified: Secondary | ICD-10-CM | POA: Diagnosis not present

## 2019-01-20 DIAGNOSIS — N186 End stage renal disease: Secondary | ICD-10-CM | POA: Diagnosis not present

## 2019-01-20 DIAGNOSIS — N2581 Secondary hyperparathyroidism of renal origin: Secondary | ICD-10-CM | POA: Diagnosis not present

## 2019-01-22 DIAGNOSIS — N186 End stage renal disease: Secondary | ICD-10-CM | POA: Diagnosis not present

## 2019-01-22 DIAGNOSIS — D509 Iron deficiency anemia, unspecified: Secondary | ICD-10-CM | POA: Diagnosis not present

## 2019-01-22 DIAGNOSIS — Z992 Dependence on renal dialysis: Secondary | ICD-10-CM | POA: Diagnosis not present

## 2019-01-22 DIAGNOSIS — N2581 Secondary hyperparathyroidism of renal origin: Secondary | ICD-10-CM | POA: Diagnosis not present

## 2019-01-25 DIAGNOSIS — Z992 Dependence on renal dialysis: Secondary | ICD-10-CM | POA: Diagnosis not present

## 2019-01-25 DIAGNOSIS — D509 Iron deficiency anemia, unspecified: Secondary | ICD-10-CM | POA: Diagnosis not present

## 2019-01-25 DIAGNOSIS — N186 End stage renal disease: Secondary | ICD-10-CM | POA: Diagnosis not present

## 2019-01-25 DIAGNOSIS — N2581 Secondary hyperparathyroidism of renal origin: Secondary | ICD-10-CM | POA: Diagnosis not present

## 2019-01-27 DIAGNOSIS — D509 Iron deficiency anemia, unspecified: Secondary | ICD-10-CM | POA: Diagnosis not present

## 2019-01-27 DIAGNOSIS — N2581 Secondary hyperparathyroidism of renal origin: Secondary | ICD-10-CM | POA: Diagnosis not present

## 2019-01-27 DIAGNOSIS — Z992 Dependence on renal dialysis: Secondary | ICD-10-CM | POA: Diagnosis not present

## 2019-01-27 DIAGNOSIS — N186 End stage renal disease: Secondary | ICD-10-CM | POA: Diagnosis not present

## 2019-01-29 DIAGNOSIS — D509 Iron deficiency anemia, unspecified: Secondary | ICD-10-CM | POA: Diagnosis not present

## 2019-01-29 DIAGNOSIS — Z992 Dependence on renal dialysis: Secondary | ICD-10-CM | POA: Diagnosis not present

## 2019-01-29 DIAGNOSIS — N186 End stage renal disease: Secondary | ICD-10-CM | POA: Diagnosis not present

## 2019-01-29 DIAGNOSIS — N2581 Secondary hyperparathyroidism of renal origin: Secondary | ICD-10-CM | POA: Diagnosis not present

## 2019-02-01 DIAGNOSIS — Z992 Dependence on renal dialysis: Secondary | ICD-10-CM | POA: Diagnosis not present

## 2019-02-01 DIAGNOSIS — N2581 Secondary hyperparathyroidism of renal origin: Secondary | ICD-10-CM | POA: Diagnosis not present

## 2019-02-01 DIAGNOSIS — D509 Iron deficiency anemia, unspecified: Secondary | ICD-10-CM | POA: Diagnosis not present

## 2019-02-01 DIAGNOSIS — N186 End stage renal disease: Secondary | ICD-10-CM | POA: Diagnosis not present

## 2019-02-03 DIAGNOSIS — Z992 Dependence on renal dialysis: Secondary | ICD-10-CM | POA: Diagnosis not present

## 2019-02-03 DIAGNOSIS — D509 Iron deficiency anemia, unspecified: Secondary | ICD-10-CM | POA: Diagnosis not present

## 2019-02-03 DIAGNOSIS — N2581 Secondary hyperparathyroidism of renal origin: Secondary | ICD-10-CM | POA: Diagnosis not present

## 2019-02-03 DIAGNOSIS — N186 End stage renal disease: Secondary | ICD-10-CM | POA: Diagnosis not present

## 2019-02-05 DIAGNOSIS — D509 Iron deficiency anemia, unspecified: Secondary | ICD-10-CM | POA: Diagnosis not present

## 2019-02-05 DIAGNOSIS — Z992 Dependence on renal dialysis: Secondary | ICD-10-CM | POA: Diagnosis not present

## 2019-02-05 DIAGNOSIS — N2581 Secondary hyperparathyroidism of renal origin: Secondary | ICD-10-CM | POA: Diagnosis not present

## 2019-02-05 DIAGNOSIS — N186 End stage renal disease: Secondary | ICD-10-CM | POA: Diagnosis not present

## 2019-02-08 DIAGNOSIS — N2581 Secondary hyperparathyroidism of renal origin: Secondary | ICD-10-CM | POA: Diagnosis not present

## 2019-02-08 DIAGNOSIS — N186 End stage renal disease: Secondary | ICD-10-CM | POA: Diagnosis not present

## 2019-02-08 DIAGNOSIS — D509 Iron deficiency anemia, unspecified: Secondary | ICD-10-CM | POA: Diagnosis not present

## 2019-02-08 DIAGNOSIS — Z992 Dependence on renal dialysis: Secondary | ICD-10-CM | POA: Diagnosis not present

## 2019-02-09 DIAGNOSIS — N186 End stage renal disease: Secondary | ICD-10-CM | POA: Diagnosis not present

## 2019-02-09 DIAGNOSIS — Z992 Dependence on renal dialysis: Secondary | ICD-10-CM | POA: Diagnosis not present

## 2019-02-10 DIAGNOSIS — Z23 Encounter for immunization: Secondary | ICD-10-CM | POA: Diagnosis not present

## 2019-02-10 DIAGNOSIS — D509 Iron deficiency anemia, unspecified: Secondary | ICD-10-CM | POA: Diagnosis not present

## 2019-02-10 DIAGNOSIS — N2581 Secondary hyperparathyroidism of renal origin: Secondary | ICD-10-CM | POA: Diagnosis not present

## 2019-02-10 DIAGNOSIS — N186 End stage renal disease: Secondary | ICD-10-CM | POA: Diagnosis not present

## 2019-02-10 DIAGNOSIS — Z992 Dependence on renal dialysis: Secondary | ICD-10-CM | POA: Diagnosis not present

## 2019-02-12 DIAGNOSIS — Z992 Dependence on renal dialysis: Secondary | ICD-10-CM | POA: Diagnosis not present

## 2019-02-12 DIAGNOSIS — N2581 Secondary hyperparathyroidism of renal origin: Secondary | ICD-10-CM | POA: Diagnosis not present

## 2019-02-12 DIAGNOSIS — Z23 Encounter for immunization: Secondary | ICD-10-CM | POA: Diagnosis not present

## 2019-02-12 DIAGNOSIS — N186 End stage renal disease: Secondary | ICD-10-CM | POA: Diagnosis not present

## 2019-02-12 DIAGNOSIS — D509 Iron deficiency anemia, unspecified: Secondary | ICD-10-CM | POA: Diagnosis not present

## 2019-02-15 DIAGNOSIS — Z992 Dependence on renal dialysis: Secondary | ICD-10-CM | POA: Diagnosis not present

## 2019-02-15 DIAGNOSIS — N186 End stage renal disease: Secondary | ICD-10-CM | POA: Diagnosis not present

## 2019-02-15 DIAGNOSIS — Z23 Encounter for immunization: Secondary | ICD-10-CM | POA: Diagnosis not present

## 2019-02-15 DIAGNOSIS — D509 Iron deficiency anemia, unspecified: Secondary | ICD-10-CM | POA: Diagnosis not present

## 2019-02-15 DIAGNOSIS — N2581 Secondary hyperparathyroidism of renal origin: Secondary | ICD-10-CM | POA: Diagnosis not present

## 2019-02-17 DIAGNOSIS — N2581 Secondary hyperparathyroidism of renal origin: Secondary | ICD-10-CM | POA: Diagnosis not present

## 2019-02-17 DIAGNOSIS — Z992 Dependence on renal dialysis: Secondary | ICD-10-CM | POA: Diagnosis not present

## 2019-02-17 DIAGNOSIS — N186 End stage renal disease: Secondary | ICD-10-CM | POA: Diagnosis not present

## 2019-02-17 DIAGNOSIS — Z23 Encounter for immunization: Secondary | ICD-10-CM | POA: Diagnosis not present

## 2019-02-17 DIAGNOSIS — D509 Iron deficiency anemia, unspecified: Secondary | ICD-10-CM | POA: Diagnosis not present

## 2019-02-19 DIAGNOSIS — D509 Iron deficiency anemia, unspecified: Secondary | ICD-10-CM | POA: Diagnosis not present

## 2019-02-19 DIAGNOSIS — N2581 Secondary hyperparathyroidism of renal origin: Secondary | ICD-10-CM | POA: Diagnosis not present

## 2019-02-19 DIAGNOSIS — N186 End stage renal disease: Secondary | ICD-10-CM | POA: Diagnosis not present

## 2019-02-19 DIAGNOSIS — Z992 Dependence on renal dialysis: Secondary | ICD-10-CM | POA: Diagnosis not present

## 2019-02-19 DIAGNOSIS — Z23 Encounter for immunization: Secondary | ICD-10-CM | POA: Diagnosis not present

## 2019-02-22 DIAGNOSIS — N186 End stage renal disease: Secondary | ICD-10-CM | POA: Diagnosis not present

## 2019-02-22 DIAGNOSIS — Z992 Dependence on renal dialysis: Secondary | ICD-10-CM | POA: Diagnosis not present

## 2019-02-22 DIAGNOSIS — Z23 Encounter for immunization: Secondary | ICD-10-CM | POA: Diagnosis not present

## 2019-02-22 DIAGNOSIS — D509 Iron deficiency anemia, unspecified: Secondary | ICD-10-CM | POA: Diagnosis not present

## 2019-02-22 DIAGNOSIS — N2581 Secondary hyperparathyroidism of renal origin: Secondary | ICD-10-CM | POA: Diagnosis not present

## 2019-02-24 DIAGNOSIS — N186 End stage renal disease: Secondary | ICD-10-CM | POA: Diagnosis not present

## 2019-02-24 DIAGNOSIS — Z992 Dependence on renal dialysis: Secondary | ICD-10-CM | POA: Diagnosis not present

## 2019-02-24 DIAGNOSIS — D509 Iron deficiency anemia, unspecified: Secondary | ICD-10-CM | POA: Diagnosis not present

## 2019-02-24 DIAGNOSIS — N2581 Secondary hyperparathyroidism of renal origin: Secondary | ICD-10-CM | POA: Diagnosis not present

## 2019-02-24 DIAGNOSIS — Z23 Encounter for immunization: Secondary | ICD-10-CM | POA: Diagnosis not present

## 2019-02-26 DIAGNOSIS — Z23 Encounter for immunization: Secondary | ICD-10-CM | POA: Diagnosis not present

## 2019-02-26 DIAGNOSIS — Z992 Dependence on renal dialysis: Secondary | ICD-10-CM | POA: Diagnosis not present

## 2019-02-26 DIAGNOSIS — N2581 Secondary hyperparathyroidism of renal origin: Secondary | ICD-10-CM | POA: Diagnosis not present

## 2019-02-26 DIAGNOSIS — N186 End stage renal disease: Secondary | ICD-10-CM | POA: Diagnosis not present

## 2019-02-26 DIAGNOSIS — D509 Iron deficiency anemia, unspecified: Secondary | ICD-10-CM | POA: Diagnosis not present

## 2019-03-01 DIAGNOSIS — N2581 Secondary hyperparathyroidism of renal origin: Secondary | ICD-10-CM | POA: Diagnosis not present

## 2019-03-01 DIAGNOSIS — Z992 Dependence on renal dialysis: Secondary | ICD-10-CM | POA: Diagnosis not present

## 2019-03-01 DIAGNOSIS — Z23 Encounter for immunization: Secondary | ICD-10-CM | POA: Diagnosis not present

## 2019-03-01 DIAGNOSIS — D509 Iron deficiency anemia, unspecified: Secondary | ICD-10-CM | POA: Diagnosis not present

## 2019-03-01 DIAGNOSIS — N186 End stage renal disease: Secondary | ICD-10-CM | POA: Diagnosis not present

## 2019-03-03 DIAGNOSIS — N186 End stage renal disease: Secondary | ICD-10-CM | POA: Diagnosis not present

## 2019-03-03 DIAGNOSIS — D509 Iron deficiency anemia, unspecified: Secondary | ICD-10-CM | POA: Diagnosis not present

## 2019-03-03 DIAGNOSIS — Z992 Dependence on renal dialysis: Secondary | ICD-10-CM | POA: Diagnosis not present

## 2019-03-03 DIAGNOSIS — N2581 Secondary hyperparathyroidism of renal origin: Secondary | ICD-10-CM | POA: Diagnosis not present

## 2019-03-03 DIAGNOSIS — Z23 Encounter for immunization: Secondary | ICD-10-CM | POA: Diagnosis not present

## 2019-03-05 DIAGNOSIS — Z23 Encounter for immunization: Secondary | ICD-10-CM | POA: Diagnosis not present

## 2019-03-05 DIAGNOSIS — D509 Iron deficiency anemia, unspecified: Secondary | ICD-10-CM | POA: Diagnosis not present

## 2019-03-05 DIAGNOSIS — N2581 Secondary hyperparathyroidism of renal origin: Secondary | ICD-10-CM | POA: Diagnosis not present

## 2019-03-05 DIAGNOSIS — N186 End stage renal disease: Secondary | ICD-10-CM | POA: Diagnosis not present

## 2019-03-05 DIAGNOSIS — Z992 Dependence on renal dialysis: Secondary | ICD-10-CM | POA: Diagnosis not present

## 2019-03-08 DIAGNOSIS — Z992 Dependence on renal dialysis: Secondary | ICD-10-CM | POA: Diagnosis not present

## 2019-03-08 DIAGNOSIS — D509 Iron deficiency anemia, unspecified: Secondary | ICD-10-CM | POA: Diagnosis not present

## 2019-03-08 DIAGNOSIS — N186 End stage renal disease: Secondary | ICD-10-CM | POA: Diagnosis not present

## 2019-03-08 DIAGNOSIS — Z23 Encounter for immunization: Secondary | ICD-10-CM | POA: Diagnosis not present

## 2019-03-08 DIAGNOSIS — N2581 Secondary hyperparathyroidism of renal origin: Secondary | ICD-10-CM | POA: Diagnosis not present

## 2019-03-10 DIAGNOSIS — N186 End stage renal disease: Secondary | ICD-10-CM | POA: Diagnosis not present

## 2019-03-10 DIAGNOSIS — Z992 Dependence on renal dialysis: Secondary | ICD-10-CM | POA: Diagnosis not present

## 2019-03-10 DIAGNOSIS — Z23 Encounter for immunization: Secondary | ICD-10-CM | POA: Diagnosis not present

## 2019-03-10 DIAGNOSIS — D509 Iron deficiency anemia, unspecified: Secondary | ICD-10-CM | POA: Diagnosis not present

## 2019-03-10 DIAGNOSIS — N2581 Secondary hyperparathyroidism of renal origin: Secondary | ICD-10-CM | POA: Diagnosis not present

## 2019-03-12 DIAGNOSIS — N2581 Secondary hyperparathyroidism of renal origin: Secondary | ICD-10-CM | POA: Diagnosis not present

## 2019-03-12 DIAGNOSIS — Z992 Dependence on renal dialysis: Secondary | ICD-10-CM | POA: Diagnosis not present

## 2019-03-12 DIAGNOSIS — Z23 Encounter for immunization: Secondary | ICD-10-CM | POA: Diagnosis not present

## 2019-03-12 DIAGNOSIS — N186 End stage renal disease: Secondary | ICD-10-CM | POA: Diagnosis not present

## 2019-03-12 DIAGNOSIS — D509 Iron deficiency anemia, unspecified: Secondary | ICD-10-CM | POA: Diagnosis not present

## 2019-03-15 DIAGNOSIS — N2581 Secondary hyperparathyroidism of renal origin: Secondary | ICD-10-CM | POA: Diagnosis not present

## 2019-03-15 DIAGNOSIS — N186 End stage renal disease: Secondary | ICD-10-CM | POA: Diagnosis not present

## 2019-03-15 DIAGNOSIS — D509 Iron deficiency anemia, unspecified: Secondary | ICD-10-CM | POA: Diagnosis not present

## 2019-03-15 DIAGNOSIS — Z992 Dependence on renal dialysis: Secondary | ICD-10-CM | POA: Diagnosis not present

## 2019-03-17 DIAGNOSIS — Z992 Dependence on renal dialysis: Secondary | ICD-10-CM | POA: Diagnosis not present

## 2019-03-17 DIAGNOSIS — N186 End stage renal disease: Secondary | ICD-10-CM | POA: Diagnosis not present

## 2019-03-17 DIAGNOSIS — N2581 Secondary hyperparathyroidism of renal origin: Secondary | ICD-10-CM | POA: Diagnosis not present

## 2019-03-17 DIAGNOSIS — D509 Iron deficiency anemia, unspecified: Secondary | ICD-10-CM | POA: Diagnosis not present

## 2019-03-22 DIAGNOSIS — D509 Iron deficiency anemia, unspecified: Secondary | ICD-10-CM | POA: Diagnosis not present

## 2019-03-22 DIAGNOSIS — N186 End stage renal disease: Secondary | ICD-10-CM | POA: Diagnosis not present

## 2019-03-22 DIAGNOSIS — N2581 Secondary hyperparathyroidism of renal origin: Secondary | ICD-10-CM | POA: Diagnosis not present

## 2019-03-22 DIAGNOSIS — Z992 Dependence on renal dialysis: Secondary | ICD-10-CM | POA: Diagnosis not present

## 2019-03-24 DIAGNOSIS — D509 Iron deficiency anemia, unspecified: Secondary | ICD-10-CM | POA: Diagnosis not present

## 2019-03-24 DIAGNOSIS — N186 End stage renal disease: Secondary | ICD-10-CM | POA: Diagnosis not present

## 2019-03-24 DIAGNOSIS — Z992 Dependence on renal dialysis: Secondary | ICD-10-CM | POA: Diagnosis not present

## 2019-03-24 DIAGNOSIS — N2581 Secondary hyperparathyroidism of renal origin: Secondary | ICD-10-CM | POA: Diagnosis not present

## 2019-03-26 DIAGNOSIS — N2581 Secondary hyperparathyroidism of renal origin: Secondary | ICD-10-CM | POA: Diagnosis not present

## 2019-03-26 DIAGNOSIS — Z992 Dependence on renal dialysis: Secondary | ICD-10-CM | POA: Diagnosis not present

## 2019-03-26 DIAGNOSIS — D509 Iron deficiency anemia, unspecified: Secondary | ICD-10-CM | POA: Diagnosis not present

## 2019-03-26 DIAGNOSIS — N186 End stage renal disease: Secondary | ICD-10-CM | POA: Diagnosis not present

## 2019-03-29 DIAGNOSIS — N2581 Secondary hyperparathyroidism of renal origin: Secondary | ICD-10-CM | POA: Diagnosis not present

## 2019-03-29 DIAGNOSIS — Z992 Dependence on renal dialysis: Secondary | ICD-10-CM | POA: Diagnosis not present

## 2019-03-29 DIAGNOSIS — N186 End stage renal disease: Secondary | ICD-10-CM | POA: Diagnosis not present

## 2019-03-29 DIAGNOSIS — D509 Iron deficiency anemia, unspecified: Secondary | ICD-10-CM | POA: Diagnosis not present

## 2019-03-31 DIAGNOSIS — D509 Iron deficiency anemia, unspecified: Secondary | ICD-10-CM | POA: Diagnosis not present

## 2019-03-31 DIAGNOSIS — N186 End stage renal disease: Secondary | ICD-10-CM | POA: Diagnosis not present

## 2019-03-31 DIAGNOSIS — Z992 Dependence on renal dialysis: Secondary | ICD-10-CM | POA: Diagnosis not present

## 2019-03-31 DIAGNOSIS — N2581 Secondary hyperparathyroidism of renal origin: Secondary | ICD-10-CM | POA: Diagnosis not present

## 2019-04-01 DIAGNOSIS — T82858A Stenosis of vascular prosthetic devices, implants and grafts, initial encounter: Secondary | ICD-10-CM | POA: Diagnosis not present

## 2019-04-01 DIAGNOSIS — N186 End stage renal disease: Secondary | ICD-10-CM | POA: Diagnosis not present

## 2019-04-01 DIAGNOSIS — I871 Compression of vein: Secondary | ICD-10-CM | POA: Diagnosis not present

## 2019-04-01 DIAGNOSIS — Z992 Dependence on renal dialysis: Secondary | ICD-10-CM | POA: Diagnosis not present

## 2019-04-05 DIAGNOSIS — N2581 Secondary hyperparathyroidism of renal origin: Secondary | ICD-10-CM | POA: Diagnosis not present

## 2019-04-05 DIAGNOSIS — D509 Iron deficiency anemia, unspecified: Secondary | ICD-10-CM | POA: Diagnosis not present

## 2019-04-05 DIAGNOSIS — N186 End stage renal disease: Secondary | ICD-10-CM | POA: Diagnosis not present

## 2019-04-05 DIAGNOSIS — Z992 Dependence on renal dialysis: Secondary | ICD-10-CM | POA: Diagnosis not present

## 2019-04-09 DIAGNOSIS — Z992 Dependence on renal dialysis: Secondary | ICD-10-CM | POA: Diagnosis not present

## 2019-04-09 DIAGNOSIS — D509 Iron deficiency anemia, unspecified: Secondary | ICD-10-CM | POA: Diagnosis not present

## 2019-04-09 DIAGNOSIS — N186 End stage renal disease: Secondary | ICD-10-CM | POA: Diagnosis not present

## 2019-04-09 DIAGNOSIS — N2581 Secondary hyperparathyroidism of renal origin: Secondary | ICD-10-CM | POA: Diagnosis not present

## 2019-04-11 DIAGNOSIS — N186 End stage renal disease: Secondary | ICD-10-CM | POA: Diagnosis not present

## 2019-04-11 DIAGNOSIS — Z992 Dependence on renal dialysis: Secondary | ICD-10-CM | POA: Diagnosis not present

## 2019-04-14 DIAGNOSIS — R203 Hyperesthesia: Secondary | ICD-10-CM | POA: Diagnosis not present

## 2019-04-14 DIAGNOSIS — Z681 Body mass index (BMI) 19 or less, adult: Secondary | ICD-10-CM | POA: Diagnosis not present

## 2019-04-14 DIAGNOSIS — L989 Disorder of the skin and subcutaneous tissue, unspecified: Secondary | ICD-10-CM | POA: Diagnosis not present

## 2019-04-18 DIAGNOSIS — N186 End stage renal disease: Secondary | ICD-10-CM | POA: Diagnosis not present

## 2019-04-18 DIAGNOSIS — Z992 Dependence on renal dialysis: Secondary | ICD-10-CM | POA: Diagnosis not present

## 2019-04-18 DIAGNOSIS — T82858A Stenosis of vascular prosthetic devices, implants and grafts, initial encounter: Secondary | ICD-10-CM | POA: Diagnosis not present

## 2019-04-18 DIAGNOSIS — I871 Compression of vein: Secondary | ICD-10-CM | POA: Diagnosis not present

## 2019-05-14 DIAGNOSIS — D631 Anemia in chronic kidney disease: Secondary | ICD-10-CM | POA: Diagnosis not present

## 2019-05-14 DIAGNOSIS — D509 Iron deficiency anemia, unspecified: Secondary | ICD-10-CM | POA: Diagnosis not present

## 2019-05-14 DIAGNOSIS — N186 End stage renal disease: Secondary | ICD-10-CM | POA: Diagnosis not present

## 2019-05-14 DIAGNOSIS — Z992 Dependence on renal dialysis: Secondary | ICD-10-CM | POA: Diagnosis not present

## 2019-05-14 DIAGNOSIS — N2581 Secondary hyperparathyroidism of renal origin: Secondary | ICD-10-CM | POA: Diagnosis not present

## 2019-05-17 DIAGNOSIS — Z992 Dependence on renal dialysis: Secondary | ICD-10-CM | POA: Diagnosis not present

## 2019-05-17 DIAGNOSIS — D509 Iron deficiency anemia, unspecified: Secondary | ICD-10-CM | POA: Diagnosis not present

## 2019-05-17 DIAGNOSIS — N2581 Secondary hyperparathyroidism of renal origin: Secondary | ICD-10-CM | POA: Diagnosis not present

## 2019-05-17 DIAGNOSIS — D631 Anemia in chronic kidney disease: Secondary | ICD-10-CM | POA: Diagnosis not present

## 2019-05-17 DIAGNOSIS — N186 End stage renal disease: Secondary | ICD-10-CM | POA: Diagnosis not present

## 2019-05-19 DIAGNOSIS — Z992 Dependence on renal dialysis: Secondary | ICD-10-CM | POA: Diagnosis not present

## 2019-05-19 DIAGNOSIS — D631 Anemia in chronic kidney disease: Secondary | ICD-10-CM | POA: Diagnosis not present

## 2019-05-19 DIAGNOSIS — N2581 Secondary hyperparathyroidism of renal origin: Secondary | ICD-10-CM | POA: Diagnosis not present

## 2019-05-19 DIAGNOSIS — N186 End stage renal disease: Secondary | ICD-10-CM | POA: Diagnosis not present

## 2019-05-19 DIAGNOSIS — D509 Iron deficiency anemia, unspecified: Secondary | ICD-10-CM | POA: Diagnosis not present

## 2019-05-24 DIAGNOSIS — N2581 Secondary hyperparathyroidism of renal origin: Secondary | ICD-10-CM | POA: Diagnosis not present

## 2019-05-24 DIAGNOSIS — Z992 Dependence on renal dialysis: Secondary | ICD-10-CM | POA: Diagnosis not present

## 2019-05-24 DIAGNOSIS — N186 End stage renal disease: Secondary | ICD-10-CM | POA: Diagnosis not present

## 2019-05-24 DIAGNOSIS — D631 Anemia in chronic kidney disease: Secondary | ICD-10-CM | POA: Diagnosis not present

## 2019-05-24 DIAGNOSIS — D509 Iron deficiency anemia, unspecified: Secondary | ICD-10-CM | POA: Diagnosis not present

## 2019-05-26 DIAGNOSIS — D631 Anemia in chronic kidney disease: Secondary | ICD-10-CM | POA: Diagnosis not present

## 2019-05-26 DIAGNOSIS — Z992 Dependence on renal dialysis: Secondary | ICD-10-CM | POA: Diagnosis not present

## 2019-05-26 DIAGNOSIS — N2581 Secondary hyperparathyroidism of renal origin: Secondary | ICD-10-CM | POA: Diagnosis not present

## 2019-05-26 DIAGNOSIS — N186 End stage renal disease: Secondary | ICD-10-CM | POA: Diagnosis not present

## 2019-05-26 DIAGNOSIS — D509 Iron deficiency anemia, unspecified: Secondary | ICD-10-CM | POA: Diagnosis not present

## 2019-05-28 DIAGNOSIS — D631 Anemia in chronic kidney disease: Secondary | ICD-10-CM | POA: Diagnosis not present

## 2019-05-28 DIAGNOSIS — Z992 Dependence on renal dialysis: Secondary | ICD-10-CM | POA: Diagnosis not present

## 2019-05-28 DIAGNOSIS — N186 End stage renal disease: Secondary | ICD-10-CM | POA: Diagnosis not present

## 2019-05-28 DIAGNOSIS — D509 Iron deficiency anemia, unspecified: Secondary | ICD-10-CM | POA: Diagnosis not present

## 2019-05-28 DIAGNOSIS — N2581 Secondary hyperparathyroidism of renal origin: Secondary | ICD-10-CM | POA: Diagnosis not present

## 2019-05-31 DIAGNOSIS — N186 End stage renal disease: Secondary | ICD-10-CM | POA: Diagnosis not present

## 2019-05-31 DIAGNOSIS — D631 Anemia in chronic kidney disease: Secondary | ICD-10-CM | POA: Diagnosis not present

## 2019-05-31 DIAGNOSIS — N2581 Secondary hyperparathyroidism of renal origin: Secondary | ICD-10-CM | POA: Diagnosis not present

## 2019-05-31 DIAGNOSIS — D509 Iron deficiency anemia, unspecified: Secondary | ICD-10-CM | POA: Diagnosis not present

## 2019-05-31 DIAGNOSIS — Z992 Dependence on renal dialysis: Secondary | ICD-10-CM | POA: Diagnosis not present

## 2019-06-02 DIAGNOSIS — D631 Anemia in chronic kidney disease: Secondary | ICD-10-CM | POA: Diagnosis not present

## 2019-06-02 DIAGNOSIS — N186 End stage renal disease: Secondary | ICD-10-CM | POA: Diagnosis not present

## 2019-06-02 DIAGNOSIS — Z992 Dependence on renal dialysis: Secondary | ICD-10-CM | POA: Diagnosis not present

## 2019-06-02 DIAGNOSIS — D509 Iron deficiency anemia, unspecified: Secondary | ICD-10-CM | POA: Diagnosis not present

## 2019-06-02 DIAGNOSIS — N2581 Secondary hyperparathyroidism of renal origin: Secondary | ICD-10-CM | POA: Diagnosis not present

## 2019-06-07 DIAGNOSIS — D509 Iron deficiency anemia, unspecified: Secondary | ICD-10-CM | POA: Diagnosis not present

## 2019-06-07 DIAGNOSIS — N186 End stage renal disease: Secondary | ICD-10-CM | POA: Diagnosis not present

## 2019-06-07 DIAGNOSIS — D631 Anemia in chronic kidney disease: Secondary | ICD-10-CM | POA: Diagnosis not present

## 2019-06-07 DIAGNOSIS — N2581 Secondary hyperparathyroidism of renal origin: Secondary | ICD-10-CM | POA: Diagnosis not present

## 2019-06-07 DIAGNOSIS — Z992 Dependence on renal dialysis: Secondary | ICD-10-CM | POA: Diagnosis not present

## 2019-06-11 DIAGNOSIS — N2581 Secondary hyperparathyroidism of renal origin: Secondary | ICD-10-CM | POA: Diagnosis not present

## 2019-06-11 DIAGNOSIS — D631 Anemia in chronic kidney disease: Secondary | ICD-10-CM | POA: Diagnosis not present

## 2019-06-11 DIAGNOSIS — N186 End stage renal disease: Secondary | ICD-10-CM | POA: Diagnosis not present

## 2019-06-11 DIAGNOSIS — Z992 Dependence on renal dialysis: Secondary | ICD-10-CM | POA: Diagnosis not present

## 2019-06-11 DIAGNOSIS — D509 Iron deficiency anemia, unspecified: Secondary | ICD-10-CM | POA: Diagnosis not present

## 2019-06-12 DIAGNOSIS — Z992 Dependence on renal dialysis: Secondary | ICD-10-CM | POA: Diagnosis not present

## 2019-06-12 DIAGNOSIS — N186 End stage renal disease: Secondary | ICD-10-CM | POA: Diagnosis not present

## 2019-06-14 DIAGNOSIS — D631 Anemia in chronic kidney disease: Secondary | ICD-10-CM | POA: Diagnosis not present

## 2019-06-14 DIAGNOSIS — Z992 Dependence on renal dialysis: Secondary | ICD-10-CM | POA: Diagnosis not present

## 2019-06-14 DIAGNOSIS — N186 End stage renal disease: Secondary | ICD-10-CM | POA: Diagnosis not present

## 2019-06-14 DIAGNOSIS — D509 Iron deficiency anemia, unspecified: Secondary | ICD-10-CM | POA: Diagnosis not present

## 2019-06-14 DIAGNOSIS — N2581 Secondary hyperparathyroidism of renal origin: Secondary | ICD-10-CM | POA: Diagnosis not present

## 2019-06-16 DIAGNOSIS — D509 Iron deficiency anemia, unspecified: Secondary | ICD-10-CM | POA: Diagnosis not present

## 2019-06-16 DIAGNOSIS — N2581 Secondary hyperparathyroidism of renal origin: Secondary | ICD-10-CM | POA: Diagnosis not present

## 2019-06-16 DIAGNOSIS — Z992 Dependence on renal dialysis: Secondary | ICD-10-CM | POA: Diagnosis not present

## 2019-06-16 DIAGNOSIS — N186 End stage renal disease: Secondary | ICD-10-CM | POA: Diagnosis not present

## 2019-06-16 DIAGNOSIS — D631 Anemia in chronic kidney disease: Secondary | ICD-10-CM | POA: Diagnosis not present

## 2019-06-18 DIAGNOSIS — Z992 Dependence on renal dialysis: Secondary | ICD-10-CM | POA: Diagnosis not present

## 2019-06-18 DIAGNOSIS — D509 Iron deficiency anemia, unspecified: Secondary | ICD-10-CM | POA: Diagnosis not present

## 2019-06-18 DIAGNOSIS — D631 Anemia in chronic kidney disease: Secondary | ICD-10-CM | POA: Diagnosis not present

## 2019-06-18 DIAGNOSIS — N2581 Secondary hyperparathyroidism of renal origin: Secondary | ICD-10-CM | POA: Diagnosis not present

## 2019-06-18 DIAGNOSIS — N186 End stage renal disease: Secondary | ICD-10-CM | POA: Diagnosis not present

## 2019-06-20 DIAGNOSIS — Z992 Dependence on renal dialysis: Secondary | ICD-10-CM | POA: Diagnosis not present

## 2019-06-20 DIAGNOSIS — N2581 Secondary hyperparathyroidism of renal origin: Secondary | ICD-10-CM | POA: Diagnosis not present

## 2019-06-20 DIAGNOSIS — D631 Anemia in chronic kidney disease: Secondary | ICD-10-CM | POA: Diagnosis not present

## 2019-06-20 DIAGNOSIS — N186 End stage renal disease: Secondary | ICD-10-CM | POA: Diagnosis not present

## 2019-06-20 DIAGNOSIS — D509 Iron deficiency anemia, unspecified: Secondary | ICD-10-CM | POA: Diagnosis not present

## 2019-06-22 DIAGNOSIS — N2581 Secondary hyperparathyroidism of renal origin: Secondary | ICD-10-CM | POA: Diagnosis not present

## 2019-06-22 DIAGNOSIS — N186 End stage renal disease: Secondary | ICD-10-CM | POA: Diagnosis not present

## 2019-06-22 DIAGNOSIS — Z992 Dependence on renal dialysis: Secondary | ICD-10-CM | POA: Diagnosis not present

## 2019-06-22 DIAGNOSIS — D509 Iron deficiency anemia, unspecified: Secondary | ICD-10-CM | POA: Diagnosis not present

## 2019-06-22 DIAGNOSIS — D631 Anemia in chronic kidney disease: Secondary | ICD-10-CM | POA: Diagnosis not present

## 2019-06-27 DIAGNOSIS — N186 End stage renal disease: Secondary | ICD-10-CM | POA: Diagnosis not present

## 2019-06-27 DIAGNOSIS — D631 Anemia in chronic kidney disease: Secondary | ICD-10-CM | POA: Diagnosis not present

## 2019-06-27 DIAGNOSIS — N2581 Secondary hyperparathyroidism of renal origin: Secondary | ICD-10-CM | POA: Diagnosis not present

## 2019-06-27 DIAGNOSIS — Z992 Dependence on renal dialysis: Secondary | ICD-10-CM | POA: Diagnosis not present

## 2019-06-27 DIAGNOSIS — D509 Iron deficiency anemia, unspecified: Secondary | ICD-10-CM | POA: Diagnosis not present

## 2019-06-29 DIAGNOSIS — D509 Iron deficiency anemia, unspecified: Secondary | ICD-10-CM | POA: Diagnosis not present

## 2019-06-29 DIAGNOSIS — N186 End stage renal disease: Secondary | ICD-10-CM | POA: Diagnosis not present

## 2019-06-29 DIAGNOSIS — Z992 Dependence on renal dialysis: Secondary | ICD-10-CM | POA: Diagnosis not present

## 2019-06-29 DIAGNOSIS — N2581 Secondary hyperparathyroidism of renal origin: Secondary | ICD-10-CM | POA: Diagnosis not present

## 2019-06-29 DIAGNOSIS — D631 Anemia in chronic kidney disease: Secondary | ICD-10-CM | POA: Diagnosis not present

## 2019-07-01 DIAGNOSIS — Z992 Dependence on renal dialysis: Secondary | ICD-10-CM | POA: Diagnosis not present

## 2019-07-01 DIAGNOSIS — N2581 Secondary hyperparathyroidism of renal origin: Secondary | ICD-10-CM | POA: Diagnosis not present

## 2019-07-01 DIAGNOSIS — N186 End stage renal disease: Secondary | ICD-10-CM | POA: Diagnosis not present

## 2019-07-01 DIAGNOSIS — D509 Iron deficiency anemia, unspecified: Secondary | ICD-10-CM | POA: Diagnosis not present

## 2019-07-01 DIAGNOSIS — D631 Anemia in chronic kidney disease: Secondary | ICD-10-CM | POA: Diagnosis not present

## 2019-07-04 DIAGNOSIS — N2581 Secondary hyperparathyroidism of renal origin: Secondary | ICD-10-CM | POA: Diagnosis not present

## 2019-07-04 DIAGNOSIS — N186 End stage renal disease: Secondary | ICD-10-CM | POA: Diagnosis not present

## 2019-07-04 DIAGNOSIS — D509 Iron deficiency anemia, unspecified: Secondary | ICD-10-CM | POA: Diagnosis not present

## 2019-07-04 DIAGNOSIS — Z992 Dependence on renal dialysis: Secondary | ICD-10-CM | POA: Diagnosis not present

## 2019-07-04 DIAGNOSIS — D631 Anemia in chronic kidney disease: Secondary | ICD-10-CM | POA: Diagnosis not present

## 2019-07-06 DIAGNOSIS — D509 Iron deficiency anemia, unspecified: Secondary | ICD-10-CM | POA: Diagnosis not present

## 2019-07-06 DIAGNOSIS — Z992 Dependence on renal dialysis: Secondary | ICD-10-CM | POA: Diagnosis not present

## 2019-07-06 DIAGNOSIS — N2581 Secondary hyperparathyroidism of renal origin: Secondary | ICD-10-CM | POA: Diagnosis not present

## 2019-07-06 DIAGNOSIS — D631 Anemia in chronic kidney disease: Secondary | ICD-10-CM | POA: Diagnosis not present

## 2019-07-06 DIAGNOSIS — N186 End stage renal disease: Secondary | ICD-10-CM | POA: Diagnosis not present

## 2019-07-08 DIAGNOSIS — D509 Iron deficiency anemia, unspecified: Secondary | ICD-10-CM | POA: Diagnosis not present

## 2019-07-08 DIAGNOSIS — Z992 Dependence on renal dialysis: Secondary | ICD-10-CM | POA: Diagnosis not present

## 2019-07-08 DIAGNOSIS — D631 Anemia in chronic kidney disease: Secondary | ICD-10-CM | POA: Diagnosis not present

## 2019-07-08 DIAGNOSIS — N2581 Secondary hyperparathyroidism of renal origin: Secondary | ICD-10-CM | POA: Diagnosis not present

## 2019-07-08 DIAGNOSIS — N186 End stage renal disease: Secondary | ICD-10-CM | POA: Diagnosis not present

## 2019-07-10 DIAGNOSIS — N186 End stage renal disease: Secondary | ICD-10-CM | POA: Diagnosis not present

## 2019-07-10 DIAGNOSIS — Z992 Dependence on renal dialysis: Secondary | ICD-10-CM | POA: Diagnosis not present

## 2019-07-11 DIAGNOSIS — N186 End stage renal disease: Secondary | ICD-10-CM | POA: Diagnosis not present

## 2019-07-11 DIAGNOSIS — Z992 Dependence on renal dialysis: Secondary | ICD-10-CM | POA: Diagnosis not present

## 2019-07-13 DIAGNOSIS — N186 End stage renal disease: Secondary | ICD-10-CM | POA: Diagnosis not present

## 2019-07-13 DIAGNOSIS — Z992 Dependence on renal dialysis: Secondary | ICD-10-CM | POA: Diagnosis not present

## 2019-07-15 DIAGNOSIS — N186 End stage renal disease: Secondary | ICD-10-CM | POA: Diagnosis not present

## 2019-07-15 DIAGNOSIS — Z992 Dependence on renal dialysis: Secondary | ICD-10-CM | POA: Diagnosis not present

## 2019-07-18 DIAGNOSIS — N186 End stage renal disease: Secondary | ICD-10-CM | POA: Diagnosis not present

## 2019-07-18 DIAGNOSIS — Z992 Dependence on renal dialysis: Secondary | ICD-10-CM | POA: Diagnosis not present

## 2019-07-20 DIAGNOSIS — Z992 Dependence on renal dialysis: Secondary | ICD-10-CM | POA: Diagnosis not present

## 2019-07-20 DIAGNOSIS — N186 End stage renal disease: Secondary | ICD-10-CM | POA: Diagnosis not present

## 2019-07-22 DIAGNOSIS — Z992 Dependence on renal dialysis: Secondary | ICD-10-CM | POA: Diagnosis not present

## 2019-07-22 DIAGNOSIS — N186 End stage renal disease: Secondary | ICD-10-CM | POA: Diagnosis not present

## 2019-07-25 DIAGNOSIS — Z992 Dependence on renal dialysis: Secondary | ICD-10-CM | POA: Diagnosis not present

## 2019-07-25 DIAGNOSIS — N186 End stage renal disease: Secondary | ICD-10-CM | POA: Diagnosis not present

## 2019-07-27 DIAGNOSIS — Z992 Dependence on renal dialysis: Secondary | ICD-10-CM | POA: Diagnosis not present

## 2019-07-27 DIAGNOSIS — N186 End stage renal disease: Secondary | ICD-10-CM | POA: Diagnosis not present

## 2019-07-29 DIAGNOSIS — N186 End stage renal disease: Secondary | ICD-10-CM | POA: Diagnosis not present

## 2019-07-29 DIAGNOSIS — Z992 Dependence on renal dialysis: Secondary | ICD-10-CM | POA: Diagnosis not present

## 2019-08-01 DIAGNOSIS — N186 End stage renal disease: Secondary | ICD-10-CM | POA: Diagnosis not present

## 2019-08-01 DIAGNOSIS — Z992 Dependence on renal dialysis: Secondary | ICD-10-CM | POA: Diagnosis not present

## 2019-08-03 DIAGNOSIS — Z992 Dependence on renal dialysis: Secondary | ICD-10-CM | POA: Diagnosis not present

## 2019-08-03 DIAGNOSIS — N186 End stage renal disease: Secondary | ICD-10-CM | POA: Diagnosis not present

## 2019-08-05 DIAGNOSIS — N186 End stage renal disease: Secondary | ICD-10-CM | POA: Diagnosis not present

## 2019-08-05 DIAGNOSIS — Z992 Dependence on renal dialysis: Secondary | ICD-10-CM | POA: Diagnosis not present

## 2019-08-08 DIAGNOSIS — N186 End stage renal disease: Secondary | ICD-10-CM | POA: Diagnosis not present

## 2019-08-08 DIAGNOSIS — Z992 Dependence on renal dialysis: Secondary | ICD-10-CM | POA: Diagnosis not present

## 2019-08-10 DIAGNOSIS — N186 End stage renal disease: Secondary | ICD-10-CM | POA: Diagnosis not present

## 2019-08-10 DIAGNOSIS — Z992 Dependence on renal dialysis: Secondary | ICD-10-CM | POA: Diagnosis not present

## 2019-08-12 DIAGNOSIS — Z992 Dependence on renal dialysis: Secondary | ICD-10-CM | POA: Diagnosis not present

## 2019-08-12 DIAGNOSIS — N186 End stage renal disease: Secondary | ICD-10-CM | POA: Diagnosis not present

## 2019-08-15 DIAGNOSIS — N186 End stage renal disease: Secondary | ICD-10-CM | POA: Diagnosis not present

## 2019-08-15 DIAGNOSIS — Z992 Dependence on renal dialysis: Secondary | ICD-10-CM | POA: Diagnosis not present

## 2019-08-17 DIAGNOSIS — Z992 Dependence on renal dialysis: Secondary | ICD-10-CM | POA: Diagnosis not present

## 2019-08-17 DIAGNOSIS — N186 End stage renal disease: Secondary | ICD-10-CM | POA: Diagnosis not present

## 2019-08-19 DIAGNOSIS — Z992 Dependence on renal dialysis: Secondary | ICD-10-CM | POA: Diagnosis not present

## 2019-08-19 DIAGNOSIS — N186 End stage renal disease: Secondary | ICD-10-CM | POA: Diagnosis not present

## 2019-08-22 DIAGNOSIS — Z992 Dependence on renal dialysis: Secondary | ICD-10-CM | POA: Diagnosis not present

## 2019-08-22 DIAGNOSIS — N186 End stage renal disease: Secondary | ICD-10-CM | POA: Diagnosis not present

## 2019-08-25 DIAGNOSIS — Z0001 Encounter for general adult medical examination with abnormal findings: Secondary | ICD-10-CM | POA: Diagnosis not present

## 2019-08-25 DIAGNOSIS — M549 Dorsalgia, unspecified: Secondary | ICD-10-CM | POA: Diagnosis not present

## 2019-08-25 DIAGNOSIS — G7100 Muscular dystrophy, unspecified: Secondary | ICD-10-CM | POA: Diagnosis not present

## 2019-08-25 DIAGNOSIS — Z1389 Encounter for screening for other disorder: Secondary | ICD-10-CM | POA: Diagnosis not present

## 2019-08-25 DIAGNOSIS — Z681 Body mass index (BMI) 19 or less, adult: Secondary | ICD-10-CM | POA: Diagnosis not present

## 2019-08-25 DIAGNOSIS — N186 End stage renal disease: Secondary | ICD-10-CM | POA: Diagnosis not present

## 2019-08-27 DIAGNOSIS — N186 End stage renal disease: Secondary | ICD-10-CM | POA: Diagnosis not present

## 2019-08-27 DIAGNOSIS — Z992 Dependence on renal dialysis: Secondary | ICD-10-CM | POA: Diagnosis not present

## 2019-09-01 DIAGNOSIS — Z992 Dependence on renal dialysis: Secondary | ICD-10-CM | POA: Diagnosis not present

## 2019-09-01 DIAGNOSIS — N186 End stage renal disease: Secondary | ICD-10-CM | POA: Diagnosis not present

## 2019-09-03 DIAGNOSIS — N186 End stage renal disease: Secondary | ICD-10-CM | POA: Diagnosis not present

## 2019-09-03 DIAGNOSIS — Z992 Dependence on renal dialysis: Secondary | ICD-10-CM | POA: Diagnosis not present

## 2019-09-08 DIAGNOSIS — N186 End stage renal disease: Secondary | ICD-10-CM | POA: Diagnosis not present

## 2019-09-08 DIAGNOSIS — Z992 Dependence on renal dialysis: Secondary | ICD-10-CM | POA: Diagnosis not present

## 2019-09-09 DIAGNOSIS — Z992 Dependence on renal dialysis: Secondary | ICD-10-CM | POA: Diagnosis not present

## 2019-09-09 DIAGNOSIS — N186 End stage renal disease: Secondary | ICD-10-CM | POA: Diagnosis not present

## 2019-09-10 DIAGNOSIS — N186 End stage renal disease: Secondary | ICD-10-CM | POA: Diagnosis not present

## 2019-09-10 DIAGNOSIS — N2581 Secondary hyperparathyroidism of renal origin: Secondary | ICD-10-CM | POA: Diagnosis not present

## 2019-09-10 DIAGNOSIS — Z992 Dependence on renal dialysis: Secondary | ICD-10-CM | POA: Diagnosis not present

## 2019-09-10 DIAGNOSIS — D509 Iron deficiency anemia, unspecified: Secondary | ICD-10-CM | POA: Diagnosis not present

## 2019-09-13 DIAGNOSIS — N2581 Secondary hyperparathyroidism of renal origin: Secondary | ICD-10-CM | POA: Diagnosis not present

## 2019-09-13 DIAGNOSIS — D509 Iron deficiency anemia, unspecified: Secondary | ICD-10-CM | POA: Diagnosis not present

## 2019-09-13 DIAGNOSIS — Z992 Dependence on renal dialysis: Secondary | ICD-10-CM | POA: Diagnosis not present

## 2019-09-13 DIAGNOSIS — N186 End stage renal disease: Secondary | ICD-10-CM | POA: Diagnosis not present

## 2019-09-17 DIAGNOSIS — N2581 Secondary hyperparathyroidism of renal origin: Secondary | ICD-10-CM | POA: Diagnosis not present

## 2019-09-17 DIAGNOSIS — D509 Iron deficiency anemia, unspecified: Secondary | ICD-10-CM | POA: Diagnosis not present

## 2019-09-17 DIAGNOSIS — N186 End stage renal disease: Secondary | ICD-10-CM | POA: Diagnosis not present

## 2019-09-17 DIAGNOSIS — Z992 Dependence on renal dialysis: Secondary | ICD-10-CM | POA: Diagnosis not present

## 2019-09-20 DIAGNOSIS — D509 Iron deficiency anemia, unspecified: Secondary | ICD-10-CM | POA: Diagnosis not present

## 2019-09-20 DIAGNOSIS — Z992 Dependence on renal dialysis: Secondary | ICD-10-CM | POA: Diagnosis not present

## 2019-09-20 DIAGNOSIS — N2581 Secondary hyperparathyroidism of renal origin: Secondary | ICD-10-CM | POA: Diagnosis not present

## 2019-09-20 DIAGNOSIS — N186 End stage renal disease: Secondary | ICD-10-CM | POA: Diagnosis not present

## 2019-09-22 DIAGNOSIS — N186 End stage renal disease: Secondary | ICD-10-CM | POA: Diagnosis not present

## 2019-09-22 DIAGNOSIS — N2581 Secondary hyperparathyroidism of renal origin: Secondary | ICD-10-CM | POA: Diagnosis not present

## 2019-09-22 DIAGNOSIS — Z992 Dependence on renal dialysis: Secondary | ICD-10-CM | POA: Diagnosis not present

## 2019-09-22 DIAGNOSIS — D509 Iron deficiency anemia, unspecified: Secondary | ICD-10-CM | POA: Diagnosis not present

## 2019-09-24 DIAGNOSIS — Z992 Dependence on renal dialysis: Secondary | ICD-10-CM | POA: Diagnosis not present

## 2019-09-24 DIAGNOSIS — N186 End stage renal disease: Secondary | ICD-10-CM | POA: Diagnosis not present

## 2019-09-24 DIAGNOSIS — N2581 Secondary hyperparathyroidism of renal origin: Secondary | ICD-10-CM | POA: Diagnosis not present

## 2019-09-24 DIAGNOSIS — D509 Iron deficiency anemia, unspecified: Secondary | ICD-10-CM | POA: Diagnosis not present

## 2019-09-26 DIAGNOSIS — N186 End stage renal disease: Secondary | ICD-10-CM | POA: Diagnosis not present

## 2019-09-26 DIAGNOSIS — R2232 Localized swelling, mass and lump, left upper limb: Secondary | ICD-10-CM | POA: Diagnosis not present

## 2019-09-26 DIAGNOSIS — Z992 Dependence on renal dialysis: Secondary | ICD-10-CM | POA: Diagnosis not present

## 2019-09-27 DIAGNOSIS — N2581 Secondary hyperparathyroidism of renal origin: Secondary | ICD-10-CM | POA: Diagnosis not present

## 2019-09-27 DIAGNOSIS — Z992 Dependence on renal dialysis: Secondary | ICD-10-CM | POA: Diagnosis not present

## 2019-09-27 DIAGNOSIS — N186 End stage renal disease: Secondary | ICD-10-CM | POA: Diagnosis not present

## 2019-09-27 DIAGNOSIS — D509 Iron deficiency anemia, unspecified: Secondary | ICD-10-CM | POA: Diagnosis not present

## 2019-09-29 DIAGNOSIS — N2581 Secondary hyperparathyroidism of renal origin: Secondary | ICD-10-CM | POA: Diagnosis not present

## 2019-09-29 DIAGNOSIS — D509 Iron deficiency anemia, unspecified: Secondary | ICD-10-CM | POA: Diagnosis not present

## 2019-09-29 DIAGNOSIS — N186 End stage renal disease: Secondary | ICD-10-CM | POA: Diagnosis not present

## 2019-09-29 DIAGNOSIS — Z992 Dependence on renal dialysis: Secondary | ICD-10-CM | POA: Diagnosis not present

## 2019-10-01 ENCOUNTER — Emergency Department (HOSPITAL_COMMUNITY): Payer: Medicare Other

## 2019-10-01 ENCOUNTER — Encounter (HOSPITAL_COMMUNITY): Payer: Self-pay | Admitting: *Deleted

## 2019-10-01 ENCOUNTER — Inpatient Hospital Stay (HOSPITAL_COMMUNITY)
Admission: EM | Admit: 2019-10-01 | Discharge: 2019-10-01 | DRG: 480 | Disposition: A | Discharge status: Skilled Nursing Facility | Payer: Medicare Other | Attending: Family Medicine | Admitting: Family Medicine

## 2019-10-01 ENCOUNTER — Inpatient Hospital Stay (HOSPITAL_COMMUNITY)
Admission: EM | Admit: 2019-10-01 | Discharge: 2019-10-07 | Disposition: A | Discharge status: Skilled Nursing Facility | Payer: Medicare Other | Source: Other Acute Inpatient Hospital | Attending: Internal Medicine | Admitting: Internal Medicine

## 2019-10-01 ENCOUNTER — Inpatient Hospital Stay (HOSPITAL_COMMUNITY): Payer: Medicare Other

## 2019-10-01 ENCOUNTER — Other Ambulatory Visit: Payer: Self-pay

## 2019-10-01 DIAGNOSIS — S72411A Displaced unspecified condyle fracture of lower end of right femur, initial encounter for closed fracture: Secondary | ICD-10-CM | POA: Diagnosis not present

## 2019-10-01 DIAGNOSIS — Z79899 Other long term (current) drug therapy: Secondary | ICD-10-CM

## 2019-10-01 DIAGNOSIS — W1830XA Fall on same level, unspecified, initial encounter: Secondary | ICD-10-CM | POA: Diagnosis present

## 2019-10-01 DIAGNOSIS — I482 Chronic atrial fibrillation, unspecified: Secondary | ICD-10-CM | POA: Diagnosis present

## 2019-10-01 DIAGNOSIS — S72421A Displaced fracture of lateral condyle of right femur, initial encounter for closed fracture: Secondary | ICD-10-CM

## 2019-10-01 DIAGNOSIS — S72461A Displaced supracondylar fracture with intracondylar extension of lower end of right femur, initial encounter for closed fracture: Principal | ICD-10-CM | POA: Diagnosis present

## 2019-10-01 DIAGNOSIS — W19XXXA Unspecified fall, initial encounter: Secondary | ICD-10-CM

## 2019-10-01 DIAGNOSIS — D649 Anemia, unspecified: Secondary | ICD-10-CM | POA: Diagnosis present

## 2019-10-01 DIAGNOSIS — I4891 Unspecified atrial fibrillation: Secondary | ICD-10-CM | POA: Diagnosis not present

## 2019-10-01 DIAGNOSIS — G7109 Other specified muscular dystrophies: Secondary | ICD-10-CM | POA: Diagnosis present

## 2019-10-01 DIAGNOSIS — N250 Renal osteodystrophy: Secondary | ICD-10-CM | POA: Diagnosis present

## 2019-10-01 DIAGNOSIS — D62 Acute posthemorrhagic anemia: Secondary | ICD-10-CM | POA: Diagnosis not present

## 2019-10-01 DIAGNOSIS — G7100 Muscular dystrophy, unspecified: Secondary | ICD-10-CM | POA: Diagnosis not present

## 2019-10-01 DIAGNOSIS — I509 Heart failure, unspecified: Secondary | ICD-10-CM | POA: Diagnosis not present

## 2019-10-01 DIAGNOSIS — E43 Unspecified severe protein-calorie malnutrition: Secondary | ICD-10-CM | POA: Diagnosis not present

## 2019-10-01 DIAGNOSIS — G4709 Other insomnia: Secondary | ICD-10-CM | POA: Diagnosis not present

## 2019-10-01 DIAGNOSIS — Z7901 Long term (current) use of anticoagulants: Secondary | ICD-10-CM | POA: Diagnosis not present

## 2019-10-01 DIAGNOSIS — Z8249 Family history of ischemic heart disease and other diseases of the circulatory system: Secondary | ICD-10-CM | POA: Diagnosis not present

## 2019-10-01 DIAGNOSIS — M6250 Muscle wasting and atrophy, not elsewhere classified, unspecified site: Secondary | ICD-10-CM | POA: Diagnosis present

## 2019-10-01 DIAGNOSIS — E559 Vitamin D deficiency, unspecified: Secondary | ICD-10-CM | POA: Diagnosis present

## 2019-10-01 DIAGNOSIS — Z716 Tobacco abuse counseling: Secondary | ICD-10-CM

## 2019-10-01 DIAGNOSIS — I2699 Other pulmonary embolism without acute cor pulmonale: Secondary | ICD-10-CM | POA: Diagnosis present

## 2019-10-01 DIAGNOSIS — N186 End stage renal disease: Secondary | ICD-10-CM | POA: Diagnosis present

## 2019-10-01 DIAGNOSIS — S7290XA Unspecified fracture of unspecified femur, initial encounter for closed fracture: Secondary | ICD-10-CM | POA: Diagnosis not present

## 2019-10-01 DIAGNOSIS — Z4789 Encounter for other orthopedic aftercare: Secondary | ICD-10-CM | POA: Diagnosis not present

## 2019-10-01 DIAGNOSIS — Z86711 Personal history of pulmonary embolism: Secondary | ICD-10-CM | POA: Diagnosis not present

## 2019-10-01 DIAGNOSIS — I5032 Chronic diastolic (congestive) heart failure: Secondary | ICD-10-CM | POA: Diagnosis present

## 2019-10-01 DIAGNOSIS — S72491D Other fracture of lower end of right femur, subsequent encounter for closed fracture with routine healing: Secondary | ICD-10-CM | POA: Diagnosis not present

## 2019-10-01 DIAGNOSIS — I10 Essential (primary) hypertension: Secondary | ICD-10-CM | POA: Diagnosis present

## 2019-10-01 DIAGNOSIS — T148XXA Other injury of unspecified body region, initial encounter: Secondary | ICD-10-CM

## 2019-10-01 DIAGNOSIS — Z681 Body mass index (BMI) 19 or less, adult: Secondary | ICD-10-CM | POA: Diagnosis not present

## 2019-10-01 DIAGNOSIS — F330 Major depressive disorder, recurrent, mild: Secondary | ICD-10-CM | POA: Diagnosis not present

## 2019-10-01 DIAGNOSIS — R54 Age-related physical debility: Secondary | ICD-10-CM | POA: Diagnosis present

## 2019-10-01 DIAGNOSIS — D631 Anemia in chronic kidney disease: Secondary | ICD-10-CM | POA: Diagnosis not present

## 2019-10-01 DIAGNOSIS — Z79891 Long term (current) use of opiate analgesic: Secondary | ICD-10-CM

## 2019-10-01 DIAGNOSIS — R296 Repeated falls: Secondary | ICD-10-CM | POA: Diagnosis present

## 2019-10-01 DIAGNOSIS — Z888 Allergy status to other drugs, medicaments and biological substances status: Secondary | ICD-10-CM

## 2019-10-01 DIAGNOSIS — I132 Hypertensive heart and chronic kidney disease with heart failure and with stage 5 chronic kidney disease, or end stage renal disease: Secondary | ICD-10-CM | POA: Diagnosis present

## 2019-10-01 DIAGNOSIS — S72351A Displaced comminuted fracture of shaft of right femur, initial encounter for closed fracture: Secondary | ICD-10-CM

## 2019-10-01 DIAGNOSIS — Z91041 Radiographic dye allergy status: Secondary | ICD-10-CM

## 2019-10-01 DIAGNOSIS — R609 Edema, unspecified: Secondary | ICD-10-CM | POA: Diagnosis not present

## 2019-10-01 DIAGNOSIS — G129 Spinal muscular atrophy, unspecified: Secondary | ICD-10-CM | POA: Diagnosis not present

## 2019-10-01 DIAGNOSIS — F17210 Nicotine dependence, cigarettes, uncomplicated: Secondary | ICD-10-CM | POA: Diagnosis present

## 2019-10-01 DIAGNOSIS — I120 Hypertensive chronic kidney disease with stage 5 chronic kidney disease or end stage renal disease: Secondary | ICD-10-CM | POA: Diagnosis not present

## 2019-10-01 DIAGNOSIS — G71039 Limb girdle muscular dystrophy, unspecified: Secondary | ICD-10-CM | POA: Diagnosis present

## 2019-10-01 DIAGNOSIS — S72461D Displaced supracondylar fracture with intracondylar extension of lower end of right femur, subsequent encounter for closed fracture with routine healing: Secondary | ICD-10-CM | POA: Diagnosis not present

## 2019-10-01 DIAGNOSIS — M6281 Muscle weakness (generalized): Secondary | ICD-10-CM | POA: Diagnosis not present

## 2019-10-01 DIAGNOSIS — S72431A Displaced fracture of medial condyle of right femur, initial encounter for closed fracture: Secondary | ICD-10-CM

## 2019-10-01 DIAGNOSIS — Z992 Dependence on renal dialysis: Secondary | ICD-10-CM | POA: Diagnosis not present

## 2019-10-01 DIAGNOSIS — Z419 Encounter for procedure for purposes other than remedying health state, unspecified: Secondary | ICD-10-CM

## 2019-10-01 DIAGNOSIS — D509 Iron deficiency anemia, unspecified: Secondary | ICD-10-CM | POA: Diagnosis not present

## 2019-10-01 DIAGNOSIS — R262 Difficulty in walking, not elsewhere classified: Secondary | ICD-10-CM | POA: Diagnosis not present

## 2019-10-01 DIAGNOSIS — N2581 Secondary hyperparathyroidism of renal origin: Secondary | ICD-10-CM | POA: Diagnosis not present

## 2019-10-01 DIAGNOSIS — S72401D Unspecified fracture of lower end of right femur, subsequent encounter for closed fracture with routine healing: Secondary | ICD-10-CM | POA: Diagnosis not present

## 2019-10-01 DIAGNOSIS — Z7401 Bed confinement status: Secondary | ICD-10-CM | POA: Diagnosis not present

## 2019-10-01 DIAGNOSIS — I5030 Unspecified diastolic (congestive) heart failure: Secondary | ICD-10-CM | POA: Diagnosis not present

## 2019-10-01 DIAGNOSIS — Z720 Tobacco use: Secondary | ICD-10-CM | POA: Diagnosis not present

## 2019-10-01 DIAGNOSIS — J986 Disorders of diaphragm: Secondary | ICD-10-CM | POA: Diagnosis not present

## 2019-10-01 DIAGNOSIS — I499 Cardiac arrhythmia, unspecified: Secondary | ICD-10-CM | POA: Diagnosis not present

## 2019-10-01 DIAGNOSIS — R000 Tachycardia, unspecified: Secondary | ICD-10-CM | POA: Diagnosis not present

## 2019-10-01 DIAGNOSIS — Z20822 Contact with and (suspected) exposure to covid-19: Secondary | ICD-10-CM | POA: Diagnosis present

## 2019-10-01 DIAGNOSIS — R52 Pain, unspecified: Secondary | ICD-10-CM | POA: Diagnosis not present

## 2019-10-01 DIAGNOSIS — M2550 Pain in unspecified joint: Secondary | ICD-10-CM | POA: Diagnosis not present

## 2019-10-01 DIAGNOSIS — R0902 Hypoxemia: Secondary | ICD-10-CM | POA: Diagnosis not present

## 2019-10-01 DIAGNOSIS — S72491A Other fracture of lower end of right femur, initial encounter for closed fracture: Secondary | ICD-10-CM | POA: Diagnosis not present

## 2019-10-01 DIAGNOSIS — W19XXXD Unspecified fall, subsequent encounter: Secondary | ICD-10-CM | POA: Diagnosis not present

## 2019-10-01 DIAGNOSIS — M6259 Muscle wasting and atrophy, not elsewhere classified, multiple sites: Secondary | ICD-10-CM | POA: Diagnosis not present

## 2019-10-01 LAB — GLUCOSE, CAPILLARY
Glucose-Capillary: 63 mg/dL — ABNORMAL LOW (ref 70–99)
Glucose-Capillary: 59 mg/dL — ABNORMAL LOW (ref 70–99)
Glucose-Capillary: 75 mg/dL (ref 70–99)

## 2019-10-01 LAB — CBC WITH DIFFERENTIAL/PLATELET
Abs Immature Granulocytes: 0.07 10*3/uL (ref 0.00–0.07)
Basophils Absolute: 0.1 10*3/uL (ref 0.0–0.1)
Basophils Relative: 1 %
Eosinophils Absolute: 0.1 10*3/uL (ref 0.0–0.5)
Eosinophils Relative: 1 %
HCT: 39.6 % (ref 39.0–52.0)
Hemoglobin: 12.4 g/dL — ABNORMAL LOW (ref 13.0–17.0)
Immature Granulocytes: 1 %
Lymphocytes Relative: 13 %
Lymphs Abs: 1.2 10*3/uL (ref 0.7–4.0)
MCH: 32.9 pg (ref 26.0–34.0)
MCHC: 31.3 g/dL (ref 30.0–36.0)
MCV: 105 fL — ABNORMAL HIGH (ref 80.0–100.0)
Monocytes Absolute: 0.8 10*3/uL (ref 0.1–1.0)
Monocytes Relative: 9 %
Neutro Abs: 7.3 10*3/uL (ref 1.7–7.7)
Neutrophils Relative %: 75 %
Platelets: 246 10*3/uL (ref 150–400)
RBC: 3.77 MIL/uL — ABNORMAL LOW (ref 4.22–5.81)
RDW: 14.2 % (ref 11.5–15.5)
WBC: 9.5 10*3/uL (ref 4.0–10.5)
nRBC: 0 % (ref 0.0–0.2)

## 2019-10-01 LAB — COMPREHENSIVE METABOLIC PANEL
ALT: 11 U/L (ref 0–44)
AST: 12 U/L — ABNORMAL LOW (ref 15–41)
Albumin: 2.5 g/dL — ABNORMAL LOW (ref 3.5–5.0)
Alkaline Phosphatase: 65 U/L (ref 38–126)
Anion gap: 10 (ref 5–15)
BUN: 12 mg/dL (ref 6–20)
CO2: 30 mmol/L (ref 22–32)
Calcium: 8.1 mg/dL — ABNORMAL LOW (ref 8.9–10.3)
Chloride: 99 mmol/L (ref 98–111)
Creatinine, Ser: 3.08 mg/dL — ABNORMAL HIGH (ref 0.61–1.24)
GFR calc Af Amer: 24 mL/min — ABNORMAL LOW (ref >60)
GFR calc non Af Amer: 21 mL/min — ABNORMAL LOW (ref >60)
Glucose, Bld: 62 mg/dL — ABNORMAL LOW (ref 70–99)
Potassium: 4.2 mmol/L (ref 3.5–5.1)
Sodium: 139 mmol/L (ref 135–145)
Total Bilirubin: 0.5 mg/dL (ref 0.3–1.2)
Total Protein: 5 g/dL — ABNORMAL LOW (ref 6.5–8.1)

## 2019-10-01 LAB — PROTIME-INR
INR: 0.9 (ref 0.8–1.2)
Prothrombin Time: 12.1 seconds (ref 11.4–15.2)

## 2019-10-01 LAB — SARS CORONAVIRUS 2 BY RT PCR (HOSPITAL ORDER, PERFORMED IN ~~LOC~~ HOSPITAL LAB): SARS Coronavirus 2: NEGATIVE

## 2019-10-01 MED ORDER — ONDANSETRON HCL 4 MG PO TABS: 4.0000 mg | ORAL_TABLET | Freq: Four times a day (QID) | ORAL | Status: DC | PRN

## 2019-10-01 MED ORDER — FENTANYL CITRATE (PF) 100 MCG/2ML IJ SOLN
50.0000 ug | Freq: Once | INTRAMUSCULAR | Status: AC
  Administered 2019-10-01: 50 ug via INTRAMUSCULAR
  Filled 2019-10-01: qty 2

## 2019-10-01 MED ORDER — SODIUM CHLORIDE 0.9 % IV SOLN: 250.0000 mL | INTRAVENOUS | Status: DC | PRN

## 2019-10-01 MED ORDER — LIDOCAINE-PRILOCAINE 2.5-2.5 % EX CREA
1.0000 "application " | TOPICAL_CREAM | Freq: Once | CUTANEOUS | Status: DC
  Filled 2019-10-01: qty 5

## 2019-10-01 MED ORDER — OXYCODONE HCL 5 MG PO TABS
5.0000 mg | ORAL_TABLET | ORAL | Status: DC | PRN
  Administered 2019-10-02 (×3): 5 mg via ORAL
  Filled 2019-10-01 (×5): qty 1

## 2019-10-01 MED ORDER — SODIUM CHLORIDE 0.9 % IV SOLN: INTRAVENOUS | Status: DC

## 2019-10-01 MED ORDER — FENTANYL CITRATE (PF) 100 MCG/2ML IJ SOLN
12.5000 ug | INTRAMUSCULAR | Status: DC | PRN
  Administered 2019-10-01 – 2019-10-03 (×7): 50 ug via INTRAVENOUS
  Filled 2019-10-01 (×7): qty 2

## 2019-10-01 MED ORDER — HYDROMORPHONE HCL 1 MG/ML IJ SOLN
1.0000 mg | Freq: Once | INTRAMUSCULAR | Status: AC
  Administered 2019-10-01: 1 mg via INTRAVENOUS
  Filled 2019-10-01: qty 1

## 2019-10-01 MED ORDER — ONDANSETRON HCL 4 MG/2ML IJ SOLN
4.0000 mg | Freq: Once | INTRAMUSCULAR | Status: AC
  Administered 2019-10-01: 4 mg via INTRAVENOUS
  Filled 2019-10-01: qty 2

## 2019-10-01 MED ORDER — SEVELAMER CARBONATE 2.4 G PO PACK
3.2000 g | PACK | Freq: Three times a day (TID) | ORAL | Status: DC
  Filled 2019-10-01 (×18): qty 1

## 2019-10-01 MED ORDER — ALBUTEROL SULFATE (2.5 MG/3ML) 0.083% IN NEBU: 2.5000 mg | INHALATION_SOLUTION | RESPIRATORY_TRACT | Status: DC | PRN

## 2019-10-01 MED ORDER — ONDANSETRON HCL 4 MG/2ML IJ SOLN: 4.0000 mg | Freq: Four times a day (QID) | INTRAMUSCULAR | Status: DC | PRN

## 2019-10-01 MED ORDER — ETOMIDATE 2 MG/ML IV SOLN
10.0000 mg | Freq: Once | INTRAVENOUS | Status: AC
  Administered 2019-10-01: 10 mg via INTRAVENOUS
  Filled 2019-10-01: qty 10

## 2019-10-01 MED ORDER — POLYETHYLENE GLYCOL 3350 17 G PO PACK: 17.0000 g | PACK | Freq: Every day | ORAL | Status: DC | PRN

## 2019-10-01 MED ORDER — SODIUM CHLORIDE 0.9% FLUSH: 3.0000 mL | INTRAVENOUS | Status: DC | PRN

## 2019-10-01 MED ORDER — DILTIAZEM HCL 30 MG PO TABS
30.0000 mg | ORAL_TABLET | Freq: Three times a day (TID) | ORAL | Status: DC
  Administered 2019-10-01 – 2019-10-03 (×5): 30 mg via ORAL
  Filled 2019-10-01 (×5): qty 1

## 2019-10-01 MED ORDER — LABETALOL HCL 5 MG/ML IV SOLN: 10.0000 mg | INTRAVENOUS | Status: DC | PRN

## 2019-10-01 MED ORDER — FENTANYL CITRATE (PF) 100 MCG/2ML IJ SOLN
INTRAMUSCULAR | Status: AC
  Administered 2019-10-01: 25 ug
  Filled 2019-10-01: qty 2

## 2019-10-01 MED ORDER — FENTANYL CITRATE (PF) 100 MCG/2ML IJ SOLN: 25.0000 ug | INTRAMUSCULAR | Status: DC | PRN

## 2019-10-01 MED ORDER — TRAZODONE HCL 50 MG PO TABS
50.0000 mg | ORAL_TABLET | Freq: Every evening | ORAL | Status: DC | PRN
  Administered 2019-10-02 – 2019-10-05 (×2): 50 mg via ORAL
  Filled 2019-10-01 (×2): qty 1

## 2019-10-01 MED ORDER — SODIUM CHLORIDE 0.9% FLUSH
3.0000 mL | Freq: Two times a day (BID) | INTRAVENOUS | Status: DC
  Administered 2019-10-01 – 2019-10-06 (×6): 3 mL via INTRAVENOUS

## 2019-10-01 MED ORDER — DEXTROSE 50 % IV SOLN
1.0000 | Freq: Once | INTRAVENOUS | Status: AC
  Administered 2019-10-01: 50 mL via INTRAVENOUS
  Filled 2019-10-01: qty 50

## 2019-10-01 NOTE — ED Notes (Signed)
Attempt to call Lovey Newcomer, patient spouse at [phone].  Voice mail left.

## 2019-10-01 NOTE — Consult Note (Signed)
Reason for Consult: fracture right femur  Referring Physician: Dr Tawana Scale is an 59 y.o. male.  HPI: 59 yo male with esrd currently in dialysis fell today comng from dialysis, c/o severe right knee pain non radiating shard and worse with movement , deformity right leg near the knee   Past Medical History:  Diagnosis Date  . Alcohol use   . Anemia   . ESRD (end stage renal disease) ([place])    Hemodialysis TTHS- [facility] in [place]  . Hypertension   . Muscular dystrophy ([place])    Limb Girdle  . Pulmonary embolism ([place]) 2011  . Seizures ([place]) 09/2016  . Tobacco abuse   . Vision abnormalities     Past Surgical History:  Procedure Laterality Date  . A/V FISTULAGRAM Left 11/05/2016   Procedure: A/V Fistulagram;  Surgeon: Waynetta Sandy, MD;  Location: [facility];  Service: Cardiovascular;  Laterality: Left;  . A/V FISTULAGRAM Left 01/18/2018   Procedure: A/V FISTULAGRAM - left upper extremity;  Surgeon: Waynetta Sandy, MD;  Location: [facility];  Service: Cardiovascular;  Laterality: Left;  . AV FISTULA PLACEMENT Left 12/26/2015   Procedure: CREATION OF LEFT RADIAL-CEPHALIC ARTERIOVENOUS FISTULA FOR HEMODIALYSIS;  Surgeon: Vickie Epley, MD;  [facility];  Service: Vascular;  Laterality: Left;  . AV FISTULA PLACEMENT Left 04/28/2016   Procedure: REVISION OF LEFT RADIAL CEPHALIC ARTERIOVENOUS (AV) FISTULA  FOR DURABLE HEMODIALYSIS;  Surgeon: Vickie Epley, MD;  [facility];  Service: Vascular;  Laterality: Left;  . CARDIAC SURGERY    . CENTRAL VENOUS CATHETER INSERTION Right 12/04/2015   Procedure: INSERTION OF TUNNELED HEMODIALYSIS CATHETER RIGHT INTERNAL JUGULAR;  Surgeon: Vickie Epley, MD;  Location[facility];  Service: General;  Laterality: Right;  . IR REMOVAL TUN CV CATH W/O FL  12/10/2016  . PERIPHERAL VASCULAR BALLOON ANGIOPLASTY Left 01/18/2018   Procedure: PERIPHERAL VASCULAR BALLOON ANGIOPLASTY;  Surgeon: Waynetta Sandy, MD;  Location: [facility];  Service: Cardiovascular;  Laterality: Left;  upper arm fistula  . REVISON OF ARTERIOVENOUS FISTULA Left 07/07/2016   Procedure: CREATION OF LEFT ARM BRACHIOCEPHALIC FISTULA;  Surgeon: Waynetta Sandy, MD;  Location[facility];  Service: Vascular;  Laterality: Left;    Family History  Problem Relation Age of Onset  . Hypertension Mother   . Heart attack Father        d/o MI at 52 yo   . Cancer Neg Hx   . Kidney disease Neg Hx     Social History:  reports that he has been smoking cigarettes. He started smoking about 45 years ago. He has a 20.00 pack-year smoking history. He has never used smokeless tobacco. He reports that he does not drink alcohol or use drugs.  Allergies:  Allergies  Allergen Reactions  . Other Other (See Comments)    IV contrast- Renal issues    Medications: I have reviewed the patient's current medications.  Results for orders placed or performed during the hospital encounter of 10/01/19 (from the past 48 hour(s))  CBC with Differential/Platelet     Status: Abnormal   Collection Time: 10/01/19 11:13 AM  Result Value Ref Range   WBC 9.5 4.0 - 10.5 K/uL   RBC 3.77 (L) 4.22 - 5.81 MIL/uL   Hemoglobin 12.4 (L) 13.0 - 17.0 g/dL   HCT 39.6 39.0 - 52.0 %   MCV 105.0 (H) 80.0 - 100.0 fL   MCH 32.9 26.0 -  34.0 pg   MCHC 31.3 30.0 - 36.0 g/dL   RDW 14.2 11.5 - 15.5 %   Platelets 246 150 - 400 K/uL   nRBC 0.0 0.0 - 0.2 %   Neutrophils Relative % 75 %   Neutro Abs 7.3 1.7 - 7.7 K/uL   Lymphocytes Relative 13 %   Lymphs Abs 1.2 0.7 - 4.0 K/uL   Monocytes Relative 9 %   Monocytes Absolute 0.8 0.1 - 1.0 K/uL   Eosinophils Relative 1 %   Eosinophils Absolute 0.1 0.0 - 0.5 K/uL   Basophils Relative 1 %   Basophils Absolute 0.1 0.0 - 0.1 K/uL   Immature Granulocytes 1 %   Abs Immature Granulocytes 0.07 0.00 - 0.07 K/uL    Comment: Performed at [facility], [address] [place], [place] [address]   Comprehensive metabolic panel     Status: Abnormal   Collection Time: 10/01/19 11:13 AM  Result Value Ref Range   Sodium 139 135 - 145 mmol/L   Potassium 4.2 3.5 - 5.1 mmol/L   Chloride 99 98 - 111 mmol/L   CO2 30 22 - 32 mmol/L   Glucose, Bld 62 (L) 70 - 99 mg/dL    Comment: Glucose reference range applies only to samples taken after fasting for at least 8 hours.   BUN 12 6 - 20 mg/dL   Creatinine, Ser 3.08 (H) 0.61 - 1.24 mg/dL   Calcium 8.1 (L) 8.9 - 10.3 mg/dL   Total Protein 5.0 (L) 6.5 - 8.1 g/dL   Albumin 2.5 (L) 3.5 - 5.0 g/dL   AST 12 (L) 15 - 41 U/L   ALT 11 0 - 44 U/L   Alkaline Phosphatase 65 38 - 126 U/L   Total Bilirubin 0.5 0.3 - 1.2 mg/dL   GFR calc non Af Amer 21 (L) >60 mL/min   GFR calc Af Amer 24 (L) >60 mL/min   Anion gap 10 5 - 15    Comment: Performed at [facility], [address] [place], [place] [address]  Protime-INR     Status: None   Collection Time: 10/01/19 11:13 AM  Result Value Ref Range   Prothrombin Time 12.1 11.4 - 15.2 seconds   INR 0.9 0.8 - 1.2    Comment: (NOTE) INR goal varies based on device and disease states. Performed at [facility], [address], [place], [place] [address]   SARS Coronavirus 2 by RT PCR (hospital order, performed in [facility] hospital lab) Nasopharyngeal Nasopharyngeal Swab     Status: None   Collection Time: 10/01/19 12:15 PM   Specimen: Nasopharyngeal Swab  Result Value Ref Range   SARS Coronavirus 2 NEGATIVE NEGATIVE    Comment: (NOTE) SARS-CoV-2 target nucleic acids are NOT DETECTED. The SARS-CoV-2 RNA is generally detectable in upper and lower respiratory specimens during the acute phase of infection. The lowest concentration of SARS-CoV-2 viral copies this assay can detect is 250 copies / mL. A negative result does not preclude SARS-CoV-2 infection and should not be used as the sole basis for treatment or other patient management decisions.  A negative result may occur with improper specimen  collection / handling, submission of specimen other than nasopharyngeal swab, presence of viral mutation(s) within the areas targeted by this assay, and inadequate number of viral copies (<250 copies / mL). A negative result must be combined with clinical observations, patient history, and epidemiological information. Fact Sheet for Patients:   StrictlyIdeas.no Fact Sheet for Healthcare Providers: BankingDealers.co.za This test  is not yet approved or cleared  by the [place] and has been authorized for detection and/or diagnosis of SARS-CoV-2 by FDA under an Emergency Use Authorization (EUA).  This EUA will remain in effect (meaning this test can be used) for the duration of the COVID-19 declaration under Section 564(b)(1) of the Act, 21 U.S.C. section 360bbb-3(b)(1), unless the authorization is terminated or revoked sooner. Performed at [facility], [address] [place], [place] [address]     DG Knee 1-2 Views Right  Result Date: 10/01/2019 CLINICAL DATA:  59 year old male with a history of leg injury EXAM: RIGHT KNEE - 1-2 VIEW COMPARISON:  None. FINDINGS: Comminuted fracture of the right femur at the distal metadiaphysis. Volar angulation is estimated 75 degrees. Associated soft tissue swelling. IMPRESSION: Single view of the distal femur demonstrates comminuted fracture of the distal femoral metadiaphysis with approximately 75 degree angulation. Electronically Signed   By: Corrie Mckusick D.O.   On: 10/01/2019 11:28   CT KNEE RIGHT WO CONTRAST  Result Date: 10/01/2019 CLINICAL DATA:  Fall today. Distal femur fracture. Hemodialysis patient. EXAM: CT OF THE RIGHT KNEE WITHOUT CONTRAST TECHNIQUE: Multidetector CT imaging of the right knee was performed according to the standard protocol. Multiplanar CT image reconstructions were also generated. COMPARISON:  Radiographs same date. FINDINGS: Bones/Joint/Cartilage The knee is splinted.  There is a comminuted and displaced fracture of the distal femoral metadiaphysis with improved alignment compared with the original radiographs. The fracture demonstrates up to 1.8 cm of impaction, and there are several large butterfly fragments both anteriorly and posteriorly. There is distal intra-articular extension of an intercondylar fracture which is essentially nondisplaced. This involves the central trochlea, but not the weight-bearing articular surfaces of either femoral condyle. The patella is located and intact. The proximal tibia and proximal fibula are intact. Moderate size lipohemarthrosis. Ligaments Suboptimally assessed by CT. The cruciate ligaments and menisci are grossly intact. Muscles and Tendons Intact extensor mechanism. Mild muscular atrophy in the distal thigh. Soft tissues Soft tissue swelling around the distal femoral fracture without significant focal hematoma. No evidence of neurovascular injury. Femoropopliteal atherosclerosis noted. Linear calcifications within Hoffa's fat are likely vascular as well. IMPRESSION: 1. Comminuted and displaced fracture of the distal femoral metadiaphysis with improved alignment compared with the original radiographs. 2. Distal intra-articular extension of an intercondylar fracture which is essentially nondisplaced. No involvement of the weight-bearing articular surfaces of either femoral condyle. 3. Moderate size lipohemarthrosis. 4. No evidence of neurovascular injury. Electronically Signed   By: Richardean Sale M.D.   On: 10/01/2019 16:39   DG Chest Port 1 View  Result Date: 10/01/2019 CLINICAL DATA:  59 year old male with a history of femoral fracture EXAM: PORTABLE CHEST 1 VIEW COMPARISON:  06/09/2018 FINDINGS: Cardiomediastinal silhouette unchanged in size and contour. Low lung volumes. Improved aeration compared to the prior with coarsened interstitial markings. No pneumothorax or pleural effusion. No confluent airspace disease. Elevation of the  right hemidiaphragm. No displaced fracture. IMPRESSION: Chronic lung changes with improved aeration compared to the prior chest x-ray and no evidence of acute cardiopulmonary disease Electronically Signed   By: Corrie Mckusick D.O.   On: 10/01/2019 13:49   DG Knee Right Port  Result Date: 10/01/2019 CLINICAL DATA:  Status post reduction EXAM: PORTABLE RIGHT KNEE - 2 VIEW COMPARISON:  Film from earlier in the same day. FINDINGS: Previously seen comminuted distal femoral fracture has been reduced and casted. Fracture fragments are in near anatomic alignment. The fracture line extends to the articular  surface of the distal femur. IMPRESSION: Status post reduction and casting. Electronically Signed   By: Inez Catalina M.D.   On: 10/01/2019 13:51    Review of Systems  Skin: Positive for color change.  Neurological: Negative for numbness.  Hematological: Bruises/bleeds easily.   Blood pressure (!) 152/98, pulse (!) 110, temperature 98 F (36.7 C), temperature source Oral, resp. rate 11, height 6\' 2"  (1.88 m), weight 65.8 kg, SpO2 100 %. Physical Exam Appears tired, weathered and emaciated  Alert awake oriented to person time and place  Mood depressed and affect flat   Gait can not walk   Right leg flexed with crepitance and pain at the distal femur  rom none instab not tested  Motor exam normal tone  Skin areas of ecchymosis dry woody appearance  Pulse normal  Sensation normal   Assessment/Plan: Distal fem fracture right knee leg  Traction applied splint applied   Post reduction xrays : improved alignment , frx intraarticular   Plan   frx needs repair orif  Px needs transfer ; his av fistula is causing severe swelling of his left arm; prefer vascular eval prior to surgery     Arther Abbott 10/01/2019, 5:05 PM

## 2019-10-01 NOTE — ED Notes (Signed)
Called [facility] with bed assignment and for transport.

## 2019-10-01 NOTE — Progress Notes (Signed)
Case discussed with Dr. Aline Brochure of [facility].  This is a patient whose medical history is complicated by end-stage renal disease on hemodialysis.  He had a fall leaving dialysis today.  He has some significant dysfunction of his left arm AV fistula.  There is concern for his medical fitness for emergent surgery.  Therefore, he is being transferred to [facility].  I have discussed the case with Dr. Lennette Bihari Haddix who is on-call for come tomorrow, and also able to manage this in a subacute fashion as he does provide the orthopedic traumatology care as well.  He will review studies including a CT scan that was just ordered of the right knee.  Plan for operative management Monday, assuming patient is medically optimized.

## 2019-10-01 NOTE — Progress Notes (Signed)
Patient ID: Steven Sellers, male   DOB: Dec 25, 1960, 59 y.o.   MRN: [id number] male ESRD dialysis patient   [place] today and has a distal intra articular femur fracture   I assisted ER doctor with closed reduction to improve alignment  I rec transfer based on   ESRD WITH MARGINAL SHUNT FUNCTION LEFT ARM, and access issues for surgery as well as his esrd

## 2019-10-01 NOTE — ED Provider Notes (Signed)
  Procedures Reduction of fracture  Date/Time: 10/01/2019 1:31 PM Performed by: Amaryllis Dyke, PA-C Authorized by: Amaryllis Dyke, PA-C  Consent: Verbal consent obtained. Risks and benefits: risks, benefits and alternatives were discussed Consent given by: patient Patient understanding: patient states understanding of the procedure being performed Patient consent: the patient's understanding of the procedure matches consent given Procedure consent: procedure consent matches procedure scheduled Relevant documents: relevant documents present and verified Test results: test results available and properly labeled Site marked: the operative site was marked Imaging studies: imaging studies available Required items: required blood products, implants, devices, and special equipment available Patient identity confirmed: verbally with patient Time out: Immediately prior to procedure a "time out" was called to verify the correct patient, procedure, equipment, support staff and site/side marked as required.  Sedation: Patient sedated: yes Sedation type: moderate (conscious) sedation Sedatives: etomidate  Patient tolerance: patient tolerated the procedure well with no immediate complications Comments: Reduction of RLE femur fracture. Please see supervising physician Dr. Gloris Manchester note for full H&P and sedation information.        Leafy Kindle 10/01/19 1333    Fredia Sorrow, MD 10/01/19 1415

## 2019-10-01 NOTE — ED Triage Notes (Signed)
Patient comes the ED after a fall to his right leg and right knee when walking out of [facility].  Patient had 2.1 liters removed this morning from his dialysis.  Patient has been given 51mcs of Fentanyl IM.  Patient guarding right leg.

## 2019-10-01 NOTE — Progress Notes (Signed)
Pt arrived to unit in stretcher via [place], alert/oriented/drowsy in no apparent distress.Pt situated/orientated to room/equipments. Welcome pack/guide/menu provided,Pt has been informed that [facility] is not responsible for any losses/damges to personal belongings/valuables.or has the options to inform security for safe keeping. Pt verbalized understanding of instructions. 3 sides rail up, bed in lowest position and all wheels locked. No complaints voiced.

## 2019-10-01 NOTE — ED Provider Notes (Signed)
[facility] Provider Note   CSN: [id number] Arrival date & time: 10/01/19  0930     History Chief Complaint  Patient presents with  . Fall    right leg, knee    Steven Sellers is a 59 y.o. male.  Patient brought in by EMS.  Patient's dialysis patient he was at dialysis center he completed dialysis.  Without leaving he fell landing on his right knee.  He has deformity to the distal femur.  His dialysis days are normally Tuesday Thursdays and Saturday.  He has been dialyzed for the last 4 years.  EMS gave him 50 mcg of fentanyl.  Obvious deformity to the right leg patient has it bent at the knee.  No other injuries.  States he did not hit his head.  Did not hurt anything else.  Patient initially arrived with confusing vital signs as oxygen saturation was 68%.  Patient placed on 100% nonrebreather and he went up to about 100%.  And then placed on just 2 L and his oxygen sats have been fine.  Initially tachycardic heart rate of 124.  Initial blood pressure was on the low side 92/78 but he had just finished dialysis.  While here blood pressures improved.  No fever.  Has an AV fistula in left arm.        Past Medical History:  Diagnosis Date  . Alcohol use   . Anemia   . ESRD (end stage renal disease) ([place])    Hemodialysis [facility] in [place]  . Hypertension   . 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 Pulmonary embolism ([place]) 2011  . Seizures ([place]) 09/2016  . Tobacco abuse   . Vision abnormalities     Patient Active Problem List   Diagnosis Date Noted  . Atrial fibrillation, new onset ([place]) 02/26/2017  . First time seizure ([place])   . Left arm weakness 09/25/2016  . CHF (congestive heart failure) ([place]) 12/16/2015  . CKD (chronic kidney disease) requiring chronic dialysis ([place]) 12/16/2015  . SOB (shortness of breath) 12/16/2015  . Central venous catheter in place   . Hyponatremia 11/30/2015  . Anemia 11/30/2015  . ESRD needing dialysis ([place])  11/30/2015  . Acute renal failure (ARF) ([place]) 11/30/2015  . Hyperkalemia 05/12/2015  . CKD (chronic kidney disease) stage 3, GFR 30-59 ml/min 05/12/2015  . 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 Muscle atrophy 04/25/2015  . Arm weakness 04/25/2015  . Leg weakness 04/25/2015  . Dysesthesia 04/25/2015  . Essential hypertension 05/21/2010  . PULMONARY EMBOLISM 05/21/2010  . PLEURAL EFFUSION 05/21/2010  . RENAL FAILURE, ACUTE 05/21/2010    Past Surgical History:  Procedure Laterality Date  . A/V FISTULAGRAM Left 11/05/2016   Procedure: A/V Fistulagram;  Surgeon: Waynetta Sandy, MD;  Location: [facility];  Service: Cardiovascular;  Laterality: Left;  . A/V FISTULAGRAM Left 01/18/2018   Procedure: A/V FISTULAGRAM - left upper extremity;  Surgeon: Waynetta Sandy, MD;  Location: [facility];  Service: Cardiovascular;  Laterality: Left;  . AV FISTULA PLACEMENT Left 12/26/2015   Procedure: CREATION OF LEFT RADIAL-CEPHALIC ARTERIOVENOUS FISTULA FOR HEMODIALYSIS;  Surgeon: Vickie Epley, MD;  Location: [facility];  Service: Vascular;  Laterality: Left;  . AV FISTULA PLACEMENT Left 04/28/2016   Procedure: REVISION OF LEFT RADIAL CEPHALIC ARTERIOVENOUS (AV) FISTULA  FOR DURABLE HEMODIALYSIS;  Surgeon: Vickie Epley, MD;  Location: [facility];  Service: Vascular;  Laterality: Left;  . CARDIAC SURGERY    .  CENTRAL VENOUS CATHETER INSERTION Right 12/04/2015   Procedure: INSERTION OF TUNNELED HEMODIALYSIS CATHETER RIGHT INTERNAL JUGULAR;  Surgeon: Vickie Epley, MD;  Location: [facility];  Service: General;  Laterality: Right;  . IR REMOVAL TUN CV CATH W/O FL  12/10/2016  . PERIPHERAL VASCULAR BALLOON ANGIOPLASTY Left 01/18/2018   Procedure: PERIPHERAL VASCULAR BALLOON ANGIOPLASTY;  Surgeon: Waynetta Sandy, MD;  Location: [facility];  Service: Cardiovascular;  Laterality: Left;  upper arm fistula  . REVISON OF ARTERIOVENOUS FISTULA Left 07/07/2016     Procedure: CREATION OF LEFT ARM BRACHIOCEPHALIC FISTULA;  Surgeon: Waynetta Sandy, MD;  Location: [facility];  Service: Vascular;  Laterality: Left;       Family History  Problem Relation Age of Onset  . Hypertension Mother   . Heart attack Father        d/o MI at 57 yo   . Cancer Neg Hx   . Kidney disease Neg Hx     Social History   Tobacco Use  . Smoking status: Current Every Day Smoker    Packs/day: 0.50    Years: 40.00    Pack years: 20.00    Types: Cigarettes    Start date: 05/12/1974  . Smokeless tobacco: Never Used  . Tobacco comment: 1/2 pk per day  Substance Use Topics  . Alcohol use: No    Alcohol/week: 24.0 standard drinks    Types: 24 Standard drinks or equivalent per week    Comment: occ  . Drug use: No    Home Medications Prior to Admission medications   Medication Sig Start Date End Date Taking? Authorizing Provider  acetaminophen (TYLENOL) 500 MG tablet Take 1,000 mg by mouth every 6 (six) hours as needed for mild pain or moderate pain.   Yes [provider]  lidocaine-prilocaine (EMLA) cream Apply 1 application topically once.  01/06/17  Yes [provider]  cefpodoxime (VANTIN) 200 MG tablet Take 1 tablet (200 mg total) by mouth daily. Patient not taking: Reported on 01/01/2018 02/28/17   Janece Canterbury, MD  diltiazem (CARDIZEM) 30 MG tablet Take 1 tablet (30 mg total) by mouth every 8 (eight) hours. Patient not taking: Reported on 01/01/2018 02/28/17   Janece Canterbury, MD  sevelamer carbonate (RENVELA) 0.8 g PACK packet Take 3.2 g by mouth 3 (three) times daily with meals.  01/05/17   [provider]  traMADol (ULTRAM) 50 MG tablet Take 50 mg by mouth every 4 (four) hours as needed. 09/20/19   [provider]  warfarin (COUMADIN) 2.5 MG tablet Take 2 tablets (5 mg total) by mouth daily at 6 PM. Patient not taking: Reported on 01/01/2018 02/28/17   Janece Canterbury, MD    Allergies    Other  Review of Systems    Review of Systems  Constitutional: Negative for chills and fever.  HENT: Negative for rhinorrhea and sore throat.   Eyes: Negative for visual disturbance.  Respiratory: Negative for cough and shortness of breath.   Cardiovascular: Negative for chest pain and leg swelling.  Gastrointestinal: Negative for abdominal pain, diarrhea, nausea and vomiting.  Genitourinary: Negative for dysuria.  Musculoskeletal: Negative for back pain and neck pain.  Skin: Negative for rash.  Neurological: Negative for dizziness, light-headedness and headaches.  Hematological: Does not bruise/bleed easily.  Psychiatric/Behavioral: Negative for confusion.    Physical Exam Updated Vital Signs BP (!) 147/94 (BP Location: Right Arm)   Pulse 85   Temp 98 F (36.7 C) (Oral)   Resp 14  Ht 1.88 m (6\' 2" )   Wt 65.8 kg   SpO2 100%   BMI 18.62 kg/m   Physical Exam Vitals and nursing note reviewed.  Constitutional:      Appearance: Normal appearance. He is well-developed.  HENT:     Head: Normocephalic and atraumatic.  Eyes:     Extraocular Movements: Extraocular movements intact.     Conjunctiva/sclera: Conjunctivae normal.     Pupils: Pupils are equal, round, and reactive to light.  Cardiovascular:     Rate and Rhythm: Normal rate and regular rhythm.     Heart sounds: No murmur.  Pulmonary:     Effort: Pulmonary effort is normal. No respiratory distress.     Breath sounds: Normal breath sounds.  Abdominal:     Palpations: Abdomen is soft.     Tenderness: There is no abdominal tenderness.  Musculoskeletal:        General: Tenderness, deformity and signs of injury present. Normal range of motion.     Cervical back: Neck supple.     Comments: Obvious deformity to distal of right femur.  Questionable swelling at the knee.  Not able to palpate pulses distally at either foot.  But his cap refill is very good less than 2 seconds.  Sensation intact and he can move his toes.  Left upper extremity has an AV  fistula has a thrill.  There is swelling distally and just the arm and proximal forearm patient states that that is chronic that he has venous return issue vascular surgery is aware about that and the graft to make repair on his AV fistula but it is still being used.  Skin:    General: Skin is warm and dry.     Capillary Refill: Capillary refill takes less than 2 seconds.  Neurological:     General: No focal deficit present.     Mental Status: He is alert and oriented to person, place, and time.     ED Results / Procedures / Treatments   Labs (all labs ordered are listed, but only abnormal results are displayed) Labs Reviewed  CBC WITH DIFFERENTIAL/PLATELET - Abnormal; Notable for the following components:      Result Value   RBC 3.77 (*)    Hemoglobin 12.4 (*)    MCV 105.0 (*)    All other components within normal limits  COMPREHENSIVE METABOLIC PANEL - Abnormal; Notable for the following components:   Glucose, Bld 62 (*)    Creatinine, Ser 3.08 (*)    Calcium 8.1 (*)    Total Protein 5.0 (*)    Albumin 2.5 (*)    AST 12 (*)    GFR calc non Af Amer 21 (*)    GFR calc Af Amer 24 (*)    All other components within normal limits  SARS CORONAVIRUS 2 BY RT PCR (HOSPITAL ORDER, [facility])  CBC WITH DIFFERENTIAL/PLATELET  PROTIME-INR    EKG None  Radiology DG Knee 1-2 Views Right  Result Date: 10/01/2019 CLINICAL DATA:  59 year old male with a history of leg injury EXAM: RIGHT KNEE - 1-2 VIEW COMPARISON:  None. FINDINGS: Comminuted fracture of the right femur at the distal metadiaphysis. Volar angulation is estimated 75 degrees. Associated soft tissue swelling. IMPRESSION: Single view of the distal femur demonstrates comminuted fracture of the distal femoral metadiaphysis with approximately 75 degree angulation. Electronically Signed   By: Corrie Mckusick D.O.   On: 10/01/2019 11:28   DG Chest [facility]  Result Date: 10/01/2019 CLINICAL DATA:   59 year old male with a history of femoral fracture EXAM: PORTABLE CHEST 1 VIEW COMPARISON:  06/09/2018 FINDINGS: Cardiomediastinal silhouette unchanged in size and contour. Low lung volumes. Improved aeration compared to the prior with coarsened interstitial markings. No pneumothorax or pleural effusion. No confluent airspace disease. Elevation of the right hemidiaphragm. No displaced fracture. IMPRESSION: Chronic lung changes with improved aeration compared to the prior chest x-ray and no evidence of acute cardiopulmonary disease Electronically Signed   By: Corrie Mckusick D.O.   On: 10/01/2019 13:49   DG Knee Right Port  Result Date: 10/01/2019 CLINICAL DATA:  Status post reduction EXAM: PORTABLE RIGHT KNEE - 2 VIEW COMPARISON:  Film from earlier in the same day. FINDINGS: Previously seen comminuted distal femoral fracture has been reduced and casted. Fracture fragments are in near anatomic alignment. The fracture line extends to the articular surface of the distal femur. IMPRESSION: Status post reduction and casting. Electronically Signed   By: Inez Catalina M.D.   On: 10/01/2019 13:51    Procedures .Sedation  Date/Time: 10/01/2019 3:43 PM Performed by: Fredia Sorrow, MD Authorized by: Fredia Sorrow, MD   Consent:    Consent obtained:  Verbal   Consent given by:  Patient   Risks discussed:  Allergic reaction, prolonged hypoxia resulting in organ damage, inadequate sedation, dysrhythmia, respiratory compromise necessitating ventilatory assistance and intubation, vomiting and nausea   Alternatives discussed:  Analgesia without sedation Universal protocol:    Procedure explained and questions answered to patient or proxy's satisfaction: yes     Relevant documents present and verified: yes     Test results available and properly labeled: yes     Imaging studies available: yes     Required blood products, implants, devices, and special equipment available: yes     Site/side marked: no      Immediately prior to procedure a time out was called: yes   Indications:    Procedure performed:  Fracture reduction Pre-sedation assessment:    Time since last food or drink:  Unknown   NPO status caution: unable to specify NPO status     ASA classification: class 4 - patient with severe systemic disease that is a constant threat to life     Mallampati score:  II - soft palate, uvula, fauces visible   Pre-sedation assessments completed and reviewed: airway patency, cardiovascular function, hydration status, mental status, nausea/vomiting, pain level, respiratory function and temperature     Pre-sedation assessment completed:  10/01/2019 3:45 PM Immediate pre-procedure details:    Reassessment: Patient reassessed immediately prior to procedure     Reviewed: vital signs     Verified: bag valve mask available, emergency equipment available, intubation equipment available, IV patency confirmed, oxygen available and suction available   Procedure details (see MAR for exact dosages):    Preoxygenation:  Nasal cannula and nonrebreather mask   Sedation:  Etomidate   Intended level of sedation: deep   Total Provider sedation time (minutes):  30 Post-procedure details:    Attendance: Constant attendance by certified staff until patient recovered     Recovery: Patient returned to pre-procedure baseline     (including critical care time)  Medications Ordered in ED Medications  0.9 %  sodium chloride infusion ( Intravenous New Bag/Given 10/01/19 1331)  fentaNYL (SUBLIMAZE) injection 50 mcg (50 mcg Intramuscular Given 10/01/19 1032)  dextrose 50 % solution 50 mL (50 mLs Intravenous Given 10/01/19 1220)  etomidate (AMIDATE) injection 10 mg (10 mg Intravenous  Given 10/01/19 1309)  HYDROmorphone (DILAUDID) injection 1 mg (1 mg Intravenous Given 10/01/19 1217)  ondansetron (ZOFRAN) injection 4 mg (4 mg Intravenous Given 10/01/19 1218)    ED Course  I have reviewed the triage vital signs and the nursing  notes.  Pertinent labs & imaging results that were available during my care of the patient were reviewed by me and considered in my medical decision making (see chart for details).    MDM Rules/Calculators/A&P                      Patient's initial vital signs with the low oxygen sats and the lower blood pressure all corrected itself here over time.  Patient just kept on 2 L of oxygen.  Patient underwent etomidate and reduction of the fracture.  See procedure notes.  Physician assistant helped with the reduction.  Dr. Aline Brochure also saw him from orthopedics and recommended admission to orthopedics [facility] contacted Dr. Stann Mainland Dr. Stann Mainland is put a note in that he will consult on the patient.  Hospitalist contacted and they will admit the patient to the hospitalist service at [facility]  Patient's post reduction films appeared show reasonable reduction and had posterior splint placed.   Patient from a dialysis standpoint is in pretty good shape.  Final Clinical Impression(s) / ED Diagnoses Final diagnoses:  Fall, initial encounter  ESRD on dialysis [facility])  Closed bicondylar fracture of right femur, initial encounter [facility])    Rx / DC Orders ED Discharge Orders    None       Fredia Sorrow, MD 10/01/19 1547

## 2019-10-01 NOTE — H&P (Signed)
Patient Demographics:    Steven Sellers, is a 59 y.o. 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 --Fracture/Dislocation Reduced in the ED on 10/01/2019 -Discussed with Dr. Arther Abbott orthopedic surgeon at [facility] evaluated patient and recommends transfer to [facility] due to complex medical co-morbidities -Dr. Victorino December orthopedic surgeon at [facility] consulted please see note from him dated 10/01/19 -CT of the right knee ordered and pending -Plan is for Dr. Lennette Bihari Haddix orthopedic surgeon to evaluate patient on 10/02/2019 and decide on timing of operative/surgical intervention  Due to ESRD-Preferred narcotic agents for pain control are hydromorphone, fentanyl, and methadone. Morphine should not be used. Baclofen should be avoided   2)ESRD--- patient has been on hemodialysis at [facility] in [place] for about 4 years now usually Tuesday Thursdays and Saturdays via [facility] -Last HD session on 10/01/2019 without any concerns -Please see see list of preferred narcotic agents in ESRD patients above Next HD session is 10/04/2019 -Please consult nephrologist over the next day or 2 at  [facility]   3)H/o Prior PE  and Chronic atrial fibrillation--- patient and his wife declines anticoagulation -Okay to use Cardizem for rate control  4)HFpEF--- echo from 2017 with EF of 55 to 93% with diastolic dysfunction--- patient appears euvolemic at this time -No evidence of diastolic CHF exacerbation -Repeat echo requested  5)HTN--apparently BP is improved on HD sessions, continue Cardizem as above in # 3  6)Limb-Girdle Muscular Dystrophy--at baseline patient ambulates with a cane -Pt and his Wife believes muscle weakness contributed to patient's fall today  7)Tobacco abuse--okay cessation advised, okay to give nicotine patch  8) anemia of ESRD--- hemoglobin above 12, decision on ESA/Procrit per nephrologist   With History of - Reviewed by me  Past Medical History:  Diagnosis Date  . Alcohol use   . Anemia   . ESRD (end stage renal disease) ([place])    Hemodialysis [facility]- [facility] in [place]  . Hypertension   . Muscular dystrophy ([place])    Limb Girdle  . Pulmonary embolism ([place]) 2011  . Seizures ([place]) 09/2016  . Tobacco abuse   . Vision abnormalities       Past Surgical History:  Procedure Laterality Date  . A/V FISTULAGRAM Left 11/05/2016   Procedure: A/V Fistulagram;  Surgeon: Waynetta Sandy, MD;  Location: [place] CV LAB;  Service: Cardiovascular;  Laterality: Left;  . A/V FISTULAGRAM Left 01/18/2018   Procedure: A/V FISTULAGRAM - left upper extremity;  Surgeon: Waynetta Sandy, MD;  Location: [place] CV LAB;  Service: Cardiovascular;  Laterality: Left;  . AV FISTULA PLACEMENT Left 12/26/2015   Procedure: CREATION OF LEFT RADIAL-CEPHALIC ARTERIOVENOUS FISTULA FOR HEMODIALYSIS;  Surgeon: Vickie Epley, MD;  Location: [facility];  Service: Vascular;  Laterality: Left;  . AV FISTULA PLACEMENT Left 04/28/2016   Procedure: REVISION OF LEFT RADIAL CEPHALIC ARTERIOVENOUS (AV) FISTULA  FOR DURABLE HEMODIALYSIS;  Surgeon: Vickie Epley, MD;  Location: [facility];  Service:  Vascular;  Laterality: Left;  . CARDIAC SURGERY    . CENTRAL VENOUS CATHETER INSERTION Right 12/04/2015   Procedure: INSERTION OF TUNNELED HEMODIALYSIS CATHETER RIGHT INTERNAL JUGULAR;  Surgeon: Vickie Epley, MD;  Location: [facility];  Service: General;  Laterality: Right;  . IR REMOVAL TUN CV CATH W/O FL  12/10/2016  . PERIPHERAL VASCULAR BALLOON ANGIOPLASTY Left 01/18/2018   Procedure: PERIPHERAL VASCULAR BALLOON ANGIOPLASTY;  Surgeon: Waynetta Sandy, MD;  Location: [place] CV LAB;  Service: Cardiovascular;  Laterality: Left;  upper arm fistula  . REVISON OF ARTERIOVENOUS FISTULA Left 07/07/2016   Procedure: CREATION OF LEFT ARM BRACHIOCEPHALIC FISTULA;  Surgeon: Waynetta Sandy, MD;  Location: [place];  Service: Vascular;  Laterality: Left;      Chief Complaint  Patient presents with  . Fall    right leg, knee      HPI:    Steven Sellers  is a 59 y.o. male smoker with past medical history relevant for limb-girdle muscular dystrophy, ESRD on HD TTS, history of HTN, history of pulmonary embolism in 2012 and history of chronic atrial fibrillation declined anticoagulation previously--- who presents to the ED after falling while getting out from hemodialysis session at [name] in [place] today 10/01/2019 --Patient wife was in the parking lot trying to pick him up after hemodialysis patient was walking with a cane--his legs got weak presumably due to his muscular dystrophy--he fell to the ground landing on his right knee and right ankle-- --I called and spoke with patient's wife who was present during the fall to get additional history -Apparently there was no head injury, no loss of consciousness -Patient denies palpitations chest pains or dizziness or shortness of breath prior to falling or after -Patient has a remote history of isolated seizure episode, according to patient and his wife to ask no concerns about seizures today no incontinence no tongue biting - -Patient's  wife states that patient has not had alcoholic intake in about 3 to 4 weeks--- and before that was not really much of a drinker anymore  No fever  Or chills   No Nausea, Vomiting or Diarrhea   Review of systems:    In addition to the HPI above,   A full Review of  Systems was done, all other systems reviewed are negative except as noted above in HPI , .   Social History:  Reviewed by me    Social History   Tobacco Use  . Smoking status: Current Every Day Smoker    Packs/day: 0.50    Years: 40.00    Pack years: 20.00    Types: Cigarettes    Start date: 05/12/1974  . Smokeless tobacco: Never Used  . Tobacco comment: 1/2 pk per day  Substance Use Topics  . Alcohol use: No    Alcohol/week: 24.0 standard drinks    Types: 24 Standard  drinks or equivalent per week    Comment: occ     Family History :  Reviewed by me    Family History  Problem Relation Age of Onset  . Hypertension Mother   . Heart attack Father        d/o MI at 46 yo   . Cancer Neg Hx   . Kidney disease Neg Hx      Home Medications:   Prior to Admission medications   Medication Sig Start Date End Date Taking? Authorizing Provider  acetaminophen (TYLENOL) 500 MG tablet Take 1,000 mg by mouth every 6 (six) hours as needed for mild pain or moderate pain.   Yes [provider]  lidocaine-prilocaine (EMLA) cream Apply 1 application topically once.  01/06/17  Yes [provider]  cefpodoxime (VANTIN) 200 MG tablet Take 1 tablet (200 mg total) by mouth daily. Patient not taking: Reported on 01/01/2018 02/28/17   Janece Canterbury, MD  diltiazem (CARDIZEM) 30 MG tablet Take 1 tablet (30 mg total) by mouth every 8 (eight) hours. Patient not taking: Reported on 01/01/2018 02/28/17   Janece Canterbury, MD  sevelamer carbonate (RENVELA) 0.8 g PACK packet Take 3.2 g by mouth 3 (three) times daily with meals.  01/05/17   [provider]  traMADol (ULTRAM) 50 MG tablet Take 50 mg by mouth every 4  (four) hours as needed. 09/20/19   [provider]  warfarin (COUMADIN) 2.5 MG tablet Take 2 tablets (5 mg total) by mouth daily at 6 PM. Patient not taking: Reported on 01/01/2018 02/28/17   Janece Canterbury, MD    Allergies:     Allergies  Allergen Reactions  . Other Other (See Comments)    IV contrast- Renal issues     Physical Exam:   Vitals  Blood pressure (!) 143/131, pulse 95, temperature 98 F (36.7 C), temperature source Oral, resp. rate (!) 26, height 6\' 2"  (1.88 m), weight 65.8 kg, SpO2 100 %.  Physical Examination: General appearance - alert, thin- appearing, and in no distress Mental status - alert, oriented to person, place, and time,  Eyes - sclera anicteric Neck - supple, no JVD elevation , Chest - clear  to auscultation bilaterally, symmetrical air movement,  Heart - S1 and S2 normal, irregular  Abdomen - soft, nontender, nondistended, no masses or organomegaly Neurological - screening mental status exam normal, neck supple without rigidity, cranial nerves II through XII intact, DTR's normal and symmetric Extremities -  intact peripheral pulses  MSK-right knee appears to be in a brace with Ace wraps -Left arm with AV fistula positive thrill and bruit Skin - warm, dry    Data Review:    CBC Recent Labs  Lab 10/01/19 1113  WBC 9.5  HGB 12.4*  HCT 39.6  PLT 246  MCV 105.0*  MCH 32.9  MCHC 31.3  RDW 14.2  LYMPHSABS 1.2  MONOABS 0.8  EOSABS 0.1  BASOSABS 0.1   ------------------------------------------------------------------------------------------------------------------  Chemistries  Recent Labs  Lab 10/01/19 1113  NA 139  K 4.2  CL 99  CO2 30  GLUCOSE 62*  BUN 12  CREATININE 3.08*  CALCIUM 8.1*  AST 12*  ALT 11  ALKPHOS 65  BILITOT 0.5   ------------------------------------------------------------------------------------------------------------------ estimated creatinine clearance is 24 mL/min (A) (by C-G formula based on  SCr of 3.08 mg/dL (H)). ------------------------------------------------------------------------------------------------------------------ No results for input(s): TSH, T4TOTAL, T3FREE, THYROIDAB in the last 72 hours.  Invalid input(s): FREET3   Coagulation profile Recent Labs  Lab 10/01/19 1113  INR 0.9   ------------------------------------------------------------------------------------------------------------------- No results for input(s): DDIMER in the last 72 hours. -------------------------------------------------------------------------------------------------------------------  Cardiac Enzymes No results for input(s): CKMB, TROPONINI, MYOGLOBIN in the last 168 hours.  Invalid input(s): CK ------------------------------------------------------------------------------------------------------------------    Component Value Date/Time   BNP 2,072.0 (H) 12/15/2015 2310     ---------------------------------------------------------------------------------------------------------------  Urinalysis    Component Value Date/Time   COLORURINE YELLOW 05/21/2016 0941   APPEARANCEUR CLEAR 05/21/2016 0941   LABSPEC 1.020 05/21/2016 0941   PHURINE 8.0 05/21/2016 0941   GLUCOSEU NEGATIVE 05/21/2016 0941   HGBUR LARGE (A) 05/21/2016 0941   BILIRUBINUR NEGATIVE 05/21/2016 0941   KETONESUR NEGATIVE 05/21/2016 0941   PROTEINUR >300 (A) 05/21/2016 0941   UROBILINOGEN 0.2 04/11/2010 2336   NITRITE NEGATIVE 05/21/2016 0941   LEUKOCYTESUR NEGATIVE 05/21/2016 0941    ----------------------------------------------------------------------------------------------------------------   Imaging Results:    DG Knee 1-2 Views Right  Result Date: 10/01/2019 CLINICAL DATA:  59 year old male with a history of leg injury EXAM: RIGHT KNEE - 1-2 VIEW COMPARISON:  None. FINDINGS: Comminuted fracture of the right femur at the distal metadiaphysis. Volar angulation is estimated 75 degrees.  Associated soft tissue swelling. IMPRESSION: Single view of the distal femur demonstrates comminuted fracture of the distal femoral metadiaphysis with approximately 75 degree angulation. Electronically Signed   By: Corrie Mckusick D.O.   On: 10/01/2019 11:28   DG Chest Port 1 View  Result Date: 10/01/2019 CLINICAL DATA:  59 year old male with a history of femoral fracture EXAM: PORTABLE CHEST 1 VIEW COMPARISON:  06/09/2018 FINDINGS: Cardiomediastinal silhouette unchanged in size and contour. Low lung volumes. Improved aeration compared to the prior with coarsened interstitial markings. No pneumothorax or pleural effusion. No confluent airspace disease. Elevation of the right hemidiaphragm. No displaced fracture. IMPRESSION: Chronic lung changes with improved aeration compared to the prior chest x-ray and no evidence of acute cardiopulmonary disease Electronically Signed   By: Corrie Mckusick D.O.   On: 10/01/2019 13:49   DG Knee Right Port  Result Date: 10/01/2019 CLINICAL DATA:  Status post reduction EXAM: PORTABLE RIGHT KNEE - 2 VIEW COMPARISON:  Film from earlier in the same day. FINDINGS: Previously seen comminuted distal femoral fracture has been reduced and casted. Fracture fragments are in near anatomic alignment. The fracture line extends to the articular surface of the distal femur. IMPRESSION: Status post reduction and casting. Electronically Signed   By: Inez Catalina M.D.   On: 10/01/2019 13:51    Radiological Exams on Admission: DG Knee 1-2 Views Right  Result Date: 10/01/2019 CLINICAL DATA:  59 year old male with a history of leg injury EXAM: RIGHT KNEE - 1-2 VIEW COMPARISON:  None. FINDINGS: Comminuted fracture of the right femur at the distal metadiaphysis. Volar angulation is estimated 75 degrees. Associated soft tissue swelling. IMPRESSION: Single view of the distal femur demonstrates comminuted fracture of the distal femoral metadiaphysis with approximately 75 degree angulation.  Electronically Signed   By: Corrie Mckusick D.O.   On: 10/01/2019 11:28   DG Chest Port 1 View  Result Date: 10/01/2019 CLINICAL DATA:  59 year old male with a history of femoral fracture EXAM: PORTABLE CHEST 1 VIEW COMPARISON:  06/09/2018 FINDINGS: Cardiomediastinal silhouette unchanged in size and contour. Low lung volumes. Improved aeration compared to the prior with coarsened interstitial markings. No pneumothorax or pleural effusion. No confluent airspace disease. Elevation of the right hemidiaphragm. No displaced fracture. IMPRESSION: Chronic lung changes with improved aeration compared to the prior chest x-ray and no evidence of acute cardiopulmonary disease Electronically Signed  By: Corrie Mckusick D.O.   On: 10/01/2019 13:49   DG Knee Right Port  Result Date: 10/01/2019 CLINICAL DATA:  Status post reduction EXAM: PORTABLE RIGHT KNEE - 2 VIEW COMPARISON:  Film from earlier in the same day. FINDINGS: Previously seen comminuted distal femoral fracture has been reduced and casted. Fracture fragments are in near anatomic alignment. The fracture line extends to the articular surface of the distal femur. IMPRESSION: Status post reduction and casting. Electronically Signed   By: Inez Catalina M.D.   On: 10/01/2019 13:51    DVT Prophylaxis -SCD  AM Labs Ordered, also please review Full Orders  Family Communication: Admission, patients condition and plan of care including tests being ordered have been discussed with the patient and wife who indicate understanding and agree with the plan   Code Status - Full Code  Likely DC to  To home with [facility] SN[facility]  Condition   Stable  Roxan Hockey M.D on 10/01/2019 at 4:10 PM Go to [email or web address] -  for contact info  [facility] - Office  [phone]

## 2019-10-01 NOTE — Plan of Care (Signed)

## 2019-10-02 DIAGNOSIS — S72401A Unspecified fracture of lower end of right femur, initial encounter for closed fracture: Secondary | ICD-10-CM

## 2019-10-02 DIAGNOSIS — S72351A Displaced comminuted fracture of shaft of right femur, initial encounter for closed fracture: Secondary | ICD-10-CM

## 2019-10-02 LAB — CBC
HCT: 36.8 % — ABNORMAL LOW (ref 39.0–52.0)
Hemoglobin: 11.4 g/dL — ABNORMAL LOW (ref 13.0–17.0)
MCH: 32.7 pg (ref 26.0–34.0)
MCHC: 31 g/dL (ref 30.0–36.0)
MCV: 105.4 fL — ABNORMAL HIGH (ref 80.0–100.0)
Platelets: 280 10*3/uL (ref 150–400)
RBC: 3.49 MIL/uL — ABNORMAL LOW (ref 4.22–5.81)
RDW: 14.2 % (ref 11.5–15.5)
WBC: 11.8 10*3/uL — ABNORMAL HIGH (ref 4.0–10.5)
nRBC: 0 % (ref 0.0–0.2)

## 2019-10-02 LAB — GLUCOSE, CAPILLARY: Glucose-Capillary: 73 mg/dL (ref 70–99)

## 2019-10-02 LAB — BASIC METABOLIC PANEL
Anion gap: 11 (ref 5–15)
BUN: 14 mg/dL (ref 6–20)
CO2: 29 mmol/L (ref 22–32)
Calcium: 8.3 mg/dL — ABNORMAL LOW (ref 8.9–10.3)
Chloride: 102 mmol/L (ref 98–111)
Creatinine, Ser: 4.06 mg/dL — ABNORMAL HIGH (ref 0.61–1.24)
GFR calc Af Amer: 17 mL/min — ABNORMAL LOW (ref >60)
GFR calc non Af Amer: 15 mL/min — ABNORMAL LOW (ref >60)
Glucose, Bld: 89 mg/dL (ref 70–99)
Potassium: 5.4 mmol/L — ABNORMAL HIGH (ref 3.5–5.1)
Sodium: 142 mmol/L (ref 135–145)

## 2019-10-02 LAB — SURGICAL PCR SCREEN
MRSA, PCR: NEGATIVE
Staphylococcus aureus: POSITIVE — AB

## 2019-10-02 MED ORDER — SODIUM ZIRCONIUM CYCLOSILICATE 10 G PO PACK
10.0000 g | PACK | Freq: Once | ORAL | Status: AC
  Administered 2019-10-02: 10 g via ORAL
  Filled 2019-10-02: qty 1

## 2019-10-02 MED ORDER — CEFAZOLIN SODIUM-DEXTROSE 2-4 GM/100ML-% IV SOLN
2.0000 g | INTRAVENOUS | Status: AC
  Administered 2019-10-03: 2 g via INTRAVENOUS
  Filled 2019-10-02: qty 100

## 2019-10-02 NOTE — Progress Notes (Signed)
Progress Note    Steven Sellers  ZOX:[id number] DOB: Nov 16, 1960  DOA: 10/01/2019 PCP: Sharilyn Sites, MD    Brief Narrative:     Medical records reviewed and are as summarized below:  Steven Sellers is an 59 y.o. male with past medical history relevant for limb-girdle muscular dystrophy, ESRD on HD TTS, history of HTN, history of pulmonary embolism in 2012 and history of chronic atrial fibrillation declined anticoagulation previously--- who presents to the ED after falling while getting out from hemodialysis session at [facility] in [place] today 10/01/2019  Assessment/Plan:   Principal Problem:   Rt Distal Femur Comminuted/Angulated Fracture- Active Problems:   Essential hypertension   Muscle atrophy   Limb-girdle muscular dystrophy ([place])   ESRD needing dialysis ([place])   Chronic a-fib ----Declined Anti-coagulation due to Frequent Bruising   Distal Rt Femur Fx  --status post mechanical fall, rt Knee xays of distal femur demonstrates comminuted fracture of the distal femoral metadiaphysis with approximately 75 degree angulation. --Fracture/Dislocation Reduced in the ED on 10/01/2019 -Patient transferred to [facility] from [facility] due to complex medical co-morbidities -Orthopedic consultation appreciated plan is for surgery tomorrow a.m.   ESRD--- patient has been on hemodialysis at [facility] in [place] for about 4 years now usually Tuesday Thursdays and Saturdays via [facility] -Last HD session on 10/01/2019 without any concerns per patient -Chart review of Dr. Aline Brochure notes appears to be concerned about swelling of left upper extremity and fistula.  Patient states he did not finish dialysis due to not having his fistula working -Nephrology consultation appreciated  H/o Prior PE and Chronic atrial fibrillation--- patient and his wife decline anticoagulation -Okay to use Cardizem for rate control  HFpEF--- echo from 2017 with EF of 55 to 81% with diastolic dysfunction---  patient appears euvolemic at this time -No evidence of diastolic CHF exacerbation  HTN -Resume home meds  Limb-Girdle Muscular Dystrophy --at baseline patient ambulates with a cane -Pt and his Wife believes muscle weakness contributed to patient's fall today -SLP eval for wet sounding cough and cough with drinking  Tobacco abuse -Encourage cessation      Family Communication/Anticipated D/C date and plan/Code Status   DVT prophylaxis: Per orthopedics Code Status: Full Code.   Disposition Plan: Status is: Inpatient  Remains inpatient appropriate because:Inpatient level of care appropriate due to severity of illness   Dispo: The patient is from: Home              Anticipated d/c is to: SNF              Anticipated d/c date is: 3 days              Patient currently is not medically stable to d/c.          Medical Consultants:    Orthopedics  Renal   Subjective:   Having a lot of pain in his knee Drinking liquids but coughing Seems confused as he states he did not finish dialysis but chart review appears tell different story  Objective:    Vitals:   10/01/19 2130 10/02/19 0454 10/02/19 0534 10/02/19 0835  BP:  138/68 (!) 138/92 127/88  Pulse:   (!) 106 100  Resp: 18 20 16 18   Temp:  98.2 F (36.8 C) 98.1 F (36.7 C) 97.8 F (36.6 C)  TempSrc:  Oral Oral Oral  SpO2:  93% 98% 98%    Intake/Output Summary (Last 24 hours) at 10/02/2019 1158 Last data  filed at 10/02/2019 0100 Gross per 24 hour  Intake 741.67 ml  Output 700 ml  Net 41.67 ml   There were no vitals filed for this visit.  Exam:  General: Appearance:    Frail male who appears uncomfortable  Eyes:    PERRL, conjunctiva/corneas clear, EOM's intact       Lungs:    Voice wet sounding with wet cough, respirations unlabored     MS:  Right leg wrapped around knee  Neurologic:  In pain but appears confused to situation regarding dialysis Fistula in left upper extremity    Data  Reviewed:   I have personally reviewed following labs and imaging studies:  Labs: Labs show the following:   Basic Metabolic Panel: Recent Labs  Lab 10/01/19 1113 10/02/19 0330  NA 139 142  K 4.2 5.4*  CL 99 102  CO2 30 29  GLUCOSE 62* 89  BUN 12 14  CREATININE 3.08* 4.06*  CALCIUM 8.1* 8.3*   GFR Estimated Creatinine Clearance: 18.2 mL/min (A) (by C-G formula based on SCr of 4.06 mg/dL (H)). Liver Function Tests: Recent Labs  Lab 10/01/19 1113  AST 12*  ALT 11  ALKPHOS 65  BILITOT 0.5  PROT 5.0*  ALBUMIN 2.5*   No results for input(s): LIPASE, AMYLASE in the last 168 hours. No results for input(s): AMMONIA in the last 168 hours. Coagulation profile Recent Labs  Lab 10/01/19 1113  INR 0.9    CBC: Recent Labs  Lab 10/01/19 1113 10/02/19 0330  WBC 9.5 11.8*  NEUTROABS 7.3  --   HGB 12.4* 11.4*  HCT 39.6 36.8*  MCV 105.0* 105.4*  PLT 246 280   Cardiac Enzymes: No results for input(s): CKTOTAL, CKMB, CKMBINDEX, TROPONINI in the last 168 hours. BNP (last 3 results) No results for input(s): PROBNP in the last 8760 hours. CBG: Recent Labs  Lab 10/01/19 2133 10/01/19 2232 10/01/19 2311 10/02/19 0540  GLUCAP 63* 59* 75 73   D-Dimer: No results for input(s): DDIMER in the last 72 hours. Hgb A1c: No results for input(s): HGBA1C in the last 72 hours. Lipid Profile: No results for input(s): CHOL, HDL, LDLCALC, TRIG, CHOLHDL, LDLDIRECT in the last 72 hours. Thyroid function studies: No results for input(s): TSH, T4TOTAL, T3FREE, THYROIDAB in the last 72 hours.  Invalid input(s): FREET3 Anemia work up: No results for input(s): VITAMINB12, FOLATE, FERRITIN, TIBC, IRON, RETICCTPCT in the last 72 hours. Sepsis Labs: Recent Labs  Lab 10/01/19 1113 10/02/19 0330  WBC 9.5 11.8*    Microbiology Recent Results (from the past 240 hour(s))  SARS Coronavirus 2 by RT PCR (hospital order, performed in [facility] hospital lab) Nasopharyngeal  Nasopharyngeal Swab     Status: None   Collection Time: 10/01/19 12:15 PM   Specimen: Nasopharyngeal Swab  Result Value Ref Range Status   SARS Coronavirus 2 NEGATIVE NEGATIVE Final    Comment: (NOTE) SARS-CoV-2 target nucleic acids are NOT DETECTED. The SARS-CoV-2 RNA is generally detectable in upper and lower respiratory specimens during the acute phase of infection. The lowest concentration of SARS-CoV-2 viral copies this assay can detect is 250 copies / mL. A negative result does not preclude SARS-CoV-2 infection and should not be used as the sole basis for treatment or other patient management decisions.  A negative result may occur with improper specimen collection / handling, submission of specimen other than nasopharyngeal swab, presence of viral mutation(s) within the areas targeted by this assay, and inadequate number of viral copies (<250 copies /  mL). A negative result must be combined with clinical observations, patient history, and epidemiological information. Fact Sheet for Patients:   StrictlyIdeas.no Fact Sheet for Healthcare Providers: BankingDealers.co.za This test is not yet approved or cleared  by the [place] FDA and has been authorized for detection and/or diagnosis of SARS-CoV-2 by FDA under an Emergency Use Authorization (EUA).  This EUA will remain in effect (meaning this test can be used) for the duration of the COVID-19 declaration under Section 564(b)(1) of the Act, 21 U.S.C. section 360bbb-3(b)(1), unless the authorization is terminated or revoked sooner. Performed at [facility], [address], [place], [place] [address]   Surgical pcr screen     Status: Abnormal   Collection Time: 10/02/19  6:58 AM   Specimen: Nasal Mucosa; Nasal Swab  Result Value Ref Range Status   MRSA, PCR NEGATIVE NEGATIVE Final   Staphylococcus aureus POSITIVE (A) NEGATIVE Final    Comment: RESULT CALLED TO, READ BACK BY AND  VERIFIED WITH: RN Jacob Moores KING 10/02/19 0920 KB (NOTE) The Xpert SA Assay (FDA approved for NASAL specimens in patients 59 years of age and older), is one component of a comprehensive surveillance program. It is not intended to diagnose infection nor to guide or monitor treatment.     Procedures and diagnostic studies:  DG Knee 1-2 Views Right  Result Date: 10/01/2019 CLINICAL DATA:  59 year old male with a history of leg injury EXAM: RIGHT KNEE - 1-2 VIEW COMPARISON:  None. FINDINGS: Comminuted fracture of the right femur at the distal metadiaphysis. Volar angulation is estimated 75 degrees. Associated soft tissue swelling. IMPRESSION: Single view of the distal femur demonstrates comminuted fracture of the distal femoral metadiaphysis with approximately 75 degree angulation. Electronically Signed   By: Corrie Mckusick D.O.   On: 10/01/2019 11:28   CT KNEE RIGHT WO CONTRAST  Result Date: 10/01/2019 CLINICAL DATA:  Fall today. Distal femur fracture. Hemodialysis patient. EXAM: CT OF THE RIGHT KNEE WITHOUT CONTRAST TECHNIQUE: Multidetector CT imaging of the right knee was performed according to the standard protocol. Multiplanar CT image reconstructions were also generated. COMPARISON:  Radiographs same date. FINDINGS: Bones/Joint/Cartilage The knee is splinted. There is a comminuted and displaced fracture of the distal femoral metadiaphysis with improved alignment compared with the original radiographs. The fracture demonstrates up to 1.8 cm of impaction, and there are several large butterfly fragments both anteriorly and posteriorly. There is distal intra-articular extension of an intercondylar fracture which is essentially nondisplaced. This involves the central trochlea, but not the weight-bearing articular surfaces of either femoral condyle. The patella is located and intact. The proximal tibia and proximal fibula are intact. Moderate size lipohemarthrosis. Ligaments Suboptimally assessed by CT. The  cruciate ligaments and menisci are grossly intact. Muscles and Tendons Intact extensor mechanism. Mild muscular atrophy in the distal thigh. Soft tissues Soft tissue swelling around the distal femoral fracture without significant focal hematoma. No evidence of neurovascular injury. Femoropopliteal atherosclerosis noted. Linear calcifications within Hoffa's fat are likely vascular as well. IMPRESSION: 1. Comminuted and displaced fracture of the distal femoral metadiaphysis with improved alignment compared with the original radiographs. 2. Distal intra-articular extension of an intercondylar fracture which is essentially nondisplaced. No involvement of the weight-bearing articular surfaces of either femoral condyle. 3. Moderate size lipohemarthrosis. 4. No evidence of neurovascular injury. Electronically Signed   By: Richardean Sale M.D.   On: 10/01/2019 16:39   DG Chest Port 1 View  Result Date: 10/01/2019 CLINICAL DATA:  59 year old male with a history of  femoral fracture EXAM: PORTABLE CHEST 1 VIEW COMPARISON:  06/09/2018 FINDINGS: Cardiomediastinal silhouette unchanged in size and contour. Low lung volumes. Improved aeration compared to the prior with coarsened interstitial markings. No pneumothorax or pleural effusion. No confluent airspace disease. Elevation of the right hemidiaphragm. No displaced fracture. IMPRESSION: Chronic lung changes with improved aeration compared to the prior chest x-ray and no evidence of acute cardiopulmonary disease Electronically Signed   By: Corrie Mckusick D.O.   On: 10/01/2019 13:49   DG Knee Right Port  Result Date: 10/01/2019 CLINICAL DATA:  Status post reduction EXAM: PORTABLE RIGHT KNEE - 2 VIEW COMPARISON:  Film from earlier in the same day. FINDINGS: Previously seen comminuted distal femoral fracture has been reduced and casted. Fracture fragments are in near anatomic alignment. The fracture line extends to the articular surface of the distal femur. IMPRESSION: Status  post reduction and casting. Electronically Signed   By: Inez Catalina M.D.   On: 10/01/2019 13:51    Medications:   . diltiazem  30 mg Oral Q8H  . lidocaine-prilocaine  1 application Topical Once  . sevelamer carbonate  3.2 g Oral TID WC  . sodium chloride flush  3 mL Intravenous Q12H   Continuous Infusions: . sodium chloride    . [START ON 10/03/2019]  ceFAZolin (ANCEF) IV       LOS: 1 day   Geradine Girt  [facility]   How to contact the [facility] Attending or Consulting provider [place] or covering provider during after hours [place], for this patient?  1. Check the care team in [facility] and look for a) attending/consulting TRH provider listed and b) the [facility] team listed 2. Log into [email or web address] and use [place] universal password to access. If you do not have the password, please contact the hospital operator. 3. Locate the [facility] provider you are looking for under [facility] and page to a number that you can be directly reached. 4. If you still have difficulty reaching the provider, please page the [facility] (Director on Call) for the Hospitalists listed on amion for assistance.  10/02/2019, 11:58 AM

## 2019-10-02 NOTE — Plan of Care (Signed)

## 2019-10-02 NOTE — H&P (View-Only) (Signed)
Orthopaedic Trauma Service (OTS) Consult   Patient ID: Steven Sellers MRN: [id number] DOB/AGE: 1960/10/17 59 y.o.  Reason for Consult: Right distal femur fracture Referring Physician: Dr. Victorino December, MD Rosanne Gutting)  HPI: Steven Sellers is an 59 y.o. male being seen at the request of Dr. Stann Mainland for right distal femur fracture.  Patient fell while leaving dialysis.  Had immediate pain and deformity to the right knee and inability to ambulate.  Was evaluated in [facility] emergency department where imaging revealed a right distal femur fracture.  Orthopedics was consulted.  Patient transferred to [facility] for surgical fixation.  Patient evaluated this morning on 5N.  Significant pain in the right leg.  Having a hard time getting comfortable in bed.  Denies any other injuries from his fall but does state that he is sore all over.  Denies any previous injury or surgery to the right knee.  Denies any significant numbness or tingling through his lower extremities.  Patient lives at home in [place] with his wife.  Does state he has some steps to get inside his house but they are currently in the process of building a ramp.  Does not currently work, is on disability.  Is an occasional drinker, states he has not drank in over a week.  Is a 1 PPD smoker.  Is not currently on any anticoagulants  Past Medical History:  Diagnosis Date  . Alcohol use   . Anemia   . ESRD (end stage renal disease) ([place])    Hemodialysis [facility] in [place]  . Hypertension   . Muscular dystrophy ([place])    Limb Girdle  . Pulmonary embolism ([place]) 2011  . Seizures ([place]) 09/2016  . Tobacco abuse   . Vision abnormalities     Past Surgical History:  Procedure Laterality Date  . A/V FISTULAGRAM Left 11/05/2016   Procedure: A/V Fistulagram;  Surgeon: Waynetta Sandy, MD;  Location: [facility];  Service: Cardiovascular;  Laterality: Left;  . A/V FISTULAGRAM Left 01/18/2018   Procedure: A/V  FISTULAGRAM - left upper extremity;  Surgeon: Waynetta Sandy, MD;  Location: [facility];  Service: Cardiovascular;  Laterality: Left;  . AV FISTULA PLACEMENT Left 12/26/2015   Procedure: CREATION OF LEFT RADIAL-CEPHALIC ARTERIOVENOUS FISTULA FOR HEMODIALYSIS;  Surgeon: Vickie Epley, MD;  Location: [facility];  Service: Vascular;  Laterality: Left;  . AV FISTULA PLACEMENT Left 04/28/2016   Procedure: REVISION OF LEFT RADIAL CEPHALIC ARTERIOVENOUS (AV) FISTULA  FOR DURABLE HEMODIALYSIS;  Surgeon: Vickie Epley, MD;  Location: [facility];  Service: Vascular;  Laterality: Left;  . CARDIAC SURGERY    . CENTRAL VENOUS CATHETER INSERTION Right 12/04/2015   Procedure: INSERTION OF TUNNELED HEMODIALYSIS CATHETER RIGHT INTERNAL JUGULAR;  Surgeon: Vickie Epley, MD;  Location: [facility];  Service: General;  Laterality: Right;  . IR REMOVAL TUN CV CATH W/O FL  12/10/2016  . PERIPHERAL VASCULAR BALLOON ANGIOPLASTY Left 01/18/2018   Procedure: PERIPHERAL VASCULAR BALLOON ANGIOPLASTY;  Surgeon: Waynetta Sandy, MD;  Location: [facility];  Service: Cardiovascular;  Laterality: Left;  upper arm fistula  . REVISON OF ARTERIOVENOUS FISTULA Left 07/07/2016   Procedure: CREATION OF LEFT ARM BRACHIOCEPHALIC FISTULA;  Surgeon: Waynetta Sandy, MD;  Location: [facility] OR;  Service: Vascular;  Laterality: Left;    Family History  Problem Relation Age of Onset  . Hypertension Mother   . Heart attack Father        d/o MI at  36 yo   . Cancer Neg Hx   . Kidney disease Neg Hx     Social History:  reports that he has been smoking cigarettes. He started smoking about 45 years ago. He has a 20.00 pack-year smoking history. He has never used smokeless tobacco. He reports that he does not drink alcohol or use drugs.  Allergies:  Allergies  Allergen Reactions  . Other Other (See Comments)    IV contrast- Renal issues    Medications: I have reviewed the patient's current  medications.  ROS: Constitutional: No fever or chills Vision: No changes in vision ENT: No difficulty swallowing CV: No chest pain Pulm: No SOB or wheezing GI: No nausea or vomiting GU: No urgency or inability to hold urine Skin: No poor wound healing Neurologic: No numbness or tingling Psychiatric: No depression or anxiety Heme: No bruising Allergic: No reaction to medications or food   Exam: Blood pressure (!) 138/92, pulse (!) 106, temperature 98.1 F (36.7 C), temperature source Oral, resp. rate 16, SpO2 98 %. General: Chronically ill-appearing male.  Sitting up in bed, in obvious discomfort/pain but no acute distress Orientation: Alert and oriented x3 Mood and Affect: Mood and affect appropriate, pleasant and cooperative Gait: Not assessed due to known fracture Coordination and balance: Within normal limits  Right lower extremity: Splint in place, dressing is clean, dry, intact.  Tenderness with palpation throughout the leg.  Pain with any movement of the leg.  Ankle dorsiflexion/plantarflexion limited secondary to pain.  Endorses sensation to light touch distally.+ DP pulse  Left lower extremity: Skin without lesions. No tenderness to palpation. Full painless ROM, full strength in each muscle group without evidence of instability.  Endorses sensation to light touch distally.  Neurovascularly intact   Medical Decision Making: Data: Imaging: X-ray and CT scan of right knee show comminuted displaced fracture of the distal femoral metadiaphysis with intra-articular extension noted  Labs:  Results for orders placed or performed during the hospital encounter of 10/01/19 (from the past 24 hour(s))  Glucose, capillary     Status: Abnormal   Collection Time: 10/01/19  9:33 PM  Result Value Ref Range   Glucose-Capillary 63 (L) 70 - 99 mg/dL   Comment 1 Document in Chart   Glucose, capillary     Status: Abnormal   Collection Time: 10/01/19 10:32 PM  Result Value Ref Range    Glucose-Capillary 59 (L) 70 - 99 mg/dL   Comment 1 Document in Chart   Glucose, capillary     Status: None   Collection Time: 10/01/19 11:11 PM  Result Value Ref Range   Glucose-Capillary 75 70 - 99 mg/dL   Comment 1 Document in Chart   Basic metabolic panel     Status: Abnormal   Collection Time: 10/02/19  3:30 AM  Result Value Ref Range   Sodium 142 135 - 145 mmol/L   Potassium 5.4 (H) 3.5 - 5.1 mmol/L   Chloride 102 98 - 111 mmol/L   CO2 29 22 - 32 mmol/L   Glucose, Bld 89 70 - 99 mg/dL   BUN 14 6 - 20 mg/dL   Creatinine, Ser 4.06 (H) 0.61 - 1.24 mg/dL   Calcium 8.3 (L) 8.9 - 10.3 mg/dL   GFR calc non Af Amer 15 (L) >60 mL/min   GFR calc Af Amer 17 (L) >60 mL/min   Anion gap 11 5 - 15  CBC     Status: Abnormal   Collection Time: 10/02/19  3:30 AM  Result Value Ref Range   WBC 11.8 (H) 4.0 - 10.5 K/uL   RBC 3.49 (L) 4.22 - 5.81 MIL/uL   Hemoglobin 11.4 (L) 13.0 - 17.0 g/dL   HCT 36.8 (L) 39.0 - 52.0 %   MCV 105.4 (H) 80.0 - 100.0 fL   MCH 32.7 26.0 - 34.0 pg   MCHC 31.0 30.0 - 36.0 g/dL   RDW 14.2 11.5 - 15.5 %   Platelets 280 150 - 400 K/uL   nRBC 0.0 0.0 - 0.2 %  Glucose, capillary     Status: None   Collection Time: 10/02/19  5:40 AM  Result Value Ref Range   Glucose-Capillary 73 70 - 99 mg/dL   Comment 1 Document in Chart     Assessment/Plan: 59 year old male with ESRD currently on dialysis s/p fall, resulting in right distal femur fracture.  Patient has sustained a significant injury which will require surgical fixation.  Recommend proceeding with open reduction internal fixation of the right distal femur.  We will plan for this tomorrow morning.  Risk and benefits of the procedure have been discussed with the patient. Risks discussed included bleeding, infection, malunion, nonunion, damage to surrounding nerves and blood vessels, pain, hardware prominence or irritation, hardware failure, stiffness, post-traumatic arthritis, DVT/PE, compartment syndrome, and  anesthesia complications.  Patient states his understanding of these risks and agrees to proceed with surgery.  We will outpatient a diet today.  Will be n.p.o. effective midnight.  Continue NWB RLE.   Emmanual Gauthreaux A. [facility] [phone] (office) [email or web address]

## 2019-10-02 NOTE — Progress Notes (Signed)
   10/02/19 0454  Assess: MEWS Score  Temp 98.2 F (36.8 C)  BP 138/68  ECG Heart Rate (!) 104  Resp 20 (pt with anxiety/panic attack)  Level of Consciousness Alert  SpO2 93 %  O2 Device Nasal Cannula  Patient Activity (if Appropriate) In bed  O2 Flow Rate (L/min) 3 L/min  Assess: MEWS Score  MEWS Temp 0  MEWS Systolic 0  MEWS Pulse 1  MEWS RR 0  MEWS LOC 0  MEWS Score 1  MEWS Score Color Green  Assess: if the MEWS score is Yellow or Red  Were vital signs taken at a resting state? Yes  Focused Assessment Documented focused assessment  Early Detection of Sepsis Score *See Row Information* Low  MEWS guidelines implemented *See Row Information* No, vital signs rechecked  Treat  MEWS Interventions Administered prn meds/treatments  Document  Patient Outcome Stabilized after interventions  Progress note created (see row info) Yes

## 2019-10-02 NOTE — Anesthesia Preprocedure Evaluation (Addendum)
Anesthesia Evaluation  Patient identified by MRN, date of birth, ID band Patient awake    Reviewed: Allergy & Precautions, H&P , NPO status , Patient's Chart, lab work & pertinent test results  Airway Mallampati: II  TM Distance: >3 FB Neck ROM: Full    Dental no notable dental hx. (+) Edentulous Upper, Poor Dentition, Dental Advisory Given   Pulmonary neg pulmonary ROS, Current Smoker and Patient abstained from smoking.,    Pulmonary exam normal breath sounds clear to auscultation       Cardiovascular Exercise Tolerance: Good hypertension, Pt. on medications  Rhythm:Regular Rate:Normal     Neuro/Psych negative neurological ROS  negative psych ROS   GI/Hepatic negative GI ROS, Neg liver ROS,   Endo/Other  negative endocrine ROS  Renal/GU ESRF and DialysisRenal disease  negative genitourinary   Musculoskeletal   Abdominal   Peds  Hematology  (+) Blood dyscrasia, anemia ,   Anesthesia Other Findings   Reproductive/Obstetrics negative OB ROS                            Anesthesia Physical Anesthesia Plan  ASA: III  Anesthesia Plan: General   Post-op Pain Management:    Induction: Intravenous  PONV Risk Score and Plan: 2 and Ondansetron, Dexamethasone and Midazolam  Airway Management Planned: Oral ETT  Additional Equipment:   Intra-op Plan:   Post-operative Plan: Extubation in OR  Informed Consent: I have reviewed the patients History and Physical, chart, labs and discussed the procedure including the risks, benefits and alternatives for the proposed anesthesia with the patient or authorized representative who has indicated his/her understanding and acceptance.     Dental advisory given  Plan Discussed with: CRNA  Anesthesia Plan Comments:         Anesthesia Quick Evaluation

## 2019-10-02 NOTE — Consult Note (Signed)
Orthopaedic Trauma Service (OTS) Consult   Patient ID: Steven Sellers MRN: [id number] DOB/AGE: 60/06/1960 60 y.o.  Reason for Consult: Right distal femur fracture Referring Physician: Dr. Victorino December, MD Rosanne Gutting)  HPI: Steven Sellers is an 59 y.o. male being seen at the request of Dr. Stann Mainland for right distal femur fracture.  Patient fell while leaving dialysis.  Had immediate pain and deformity to the right knee and inability to ambulate.  Was evaluated in [facility] emergency department where imaging revealed a right distal femur fracture.  Orthopedics was consulted.  Patient transferred to [facility] for surgical fixation.  Patient evaluated this morning on 5N.  Significant pain in the right leg.  Having a hard time getting comfortable in bed.  Denies any other injuries from his fall but does state that he is sore all over.  Denies any previous injury or surgery to the right knee.  Denies any significant numbness or tingling through his lower extremities.  Patient lives at home in [place] with his wife.  Does state he has some steps to get inside his house but they are currently in the process of building a ramp.  Does not currently work, is on disability.  Is an occasional drinker, states he has not drank in over a week.  Is a 1 PPD smoker.  Is not currently on any anticoagulants  Past Medical History:  Diagnosis Date  . Alcohol use   . Anemia   . ESRD (end stage renal disease) ([place])    Hemodialysis [facility]- [facility] in [place]  . Hypertension   . 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 Pulmonary embolism ([place]) 2011  . Seizures ([place]) 09/2016  . Tobacco abuse   . Vision abnormalities     Past Surgical History:  Procedure Laterality Date  . A/V FISTULAGRAM Left 11/05/2016   Procedure: A/V Fistulagram;  Surgeon: Waynetta Sandy, MD;  Location: [facility];  Service: Cardiovascular;  Laterality: Left;  . A/V FISTULAGRAM Left 01/18/2018   Procedure: A/V  FISTULAGRAM - left upper extremity;  Surgeon: Waynetta Sandy, MD;  Location: [place] [facility];  Service: Cardiovascular;  Laterality: Left;  . AV FISTULA PLACEMENT Left 12/26/2015   Procedure: CREATION OF LEFT RADIAL-CEPHALIC ARTERIOVENOUS FISTULA FOR HEMODIALYSIS;  Surgeon: Vickie Epley, MD;  Location: [facility];  Service: Vascular;  Laterality: Left;  . AV FISTULA PLACEMENT Left 04/28/2016   Procedure: REVISION OF LEFT RADIAL CEPHALIC ARTERIOVENOUS (AV) FISTULA  FOR DURABLE HEMODIALYSIS;  Surgeon: Vickie Epley, MD;  Location: [facility];  Service: Vascular;  Laterality: Left;  . CARDIAC SURGERY    . CENTRAL VENOUS CATHETER INSERTION Right 12/04/2015   Procedure: INSERTION OF TUNNELED HEMODIALYSIS CATHETER RIGHT INTERNAL JUGULAR;  Surgeon: Vickie Epley, MD;  Location: [facility];  Service: General;  Laterality: Right;  . IR REMOVAL TUN CV CATH W/O FL  12/10/2016  . PERIPHERAL VASCULAR BALLOON ANGIOPLASTY Left 01/18/2018   Procedure: PERIPHERAL VASCULAR BALLOON ANGIOPLASTY;  Surgeon: Waynetta Sandy, MD;  Location: [facility];  Service: Cardiovascular;  Laterality: Left;  upper arm fistula  . REVISON OF ARTERIOVENOUS FISTULA Left 07/07/2016   Procedure: CREATION OF LEFT ARM BRACHIOCEPHALIC FISTULA;  Surgeon: Waynetta Sandy, MD;  Location: [facility] OR;  Service: Vascular;  Laterality: Left;    Family History  Problem Relation Age of Onset  . Hypertension Mother   . Heart attack Father        d/o MI at  74 yo   . Cancer Neg Hx   . Kidney disease Neg Hx     Social History:  reports that he has been smoking cigarettes. He started smoking about 45 years ago. He has a 20.00 pack-year smoking history. He has never used smokeless tobacco. He reports that he does not drink alcohol or use drugs.  Allergies:  Allergies  Allergen Reactions  . Other Other (See Comments)    IV contrast- Renal issues    Medications: I have reviewed the patient's current  medications.  ROS: Constitutional: No fever or chills Vision: No changes in vision ENT: No difficulty swallowing CV: No chest pain Pulm: No SOB or wheezing GI: No nausea or vomiting GU: No urgency or inability to hold urine Skin: No poor wound healing Neurologic: No numbness or tingling Psychiatric: No depression or anxiety Heme: No bruising Allergic: No reaction to medications or food   Exam: Blood pressure (!) 138/92, pulse (!) 106, temperature 98.1 F (36.7 C), temperature source Oral, resp. rate 16, SpO2 98 %. General: Chronically ill-appearing male.  Sitting up in bed, in obvious discomfort/pain but no acute distress Orientation: Alert and oriented x3 Mood and Affect: Mood and affect appropriate, pleasant and cooperative Gait: Not assessed due to known fracture Coordination and balance: Within normal limits  Right lower extremity: Splint in place, dressing is clean, dry, intact.  Tenderness with palpation throughout the leg.  Pain with any movement of the leg.  Ankle dorsiflexion/plantarflexion limited secondary to pain.  Endorses sensation to light touch distally.+ DP pulse  Left lower extremity: Skin without lesions. No tenderness to palpation. Full painless ROM, full strength in each muscle group without evidence of instability.  Endorses sensation to light touch distally.  Neurovascularly intact   Medical Decision Making: Data: Imaging: X-ray and CT scan of right knee show comminuted displaced fracture of the distal femoral metadiaphysis with intra-articular extension noted  Labs:  Results for orders placed or performed during the hospital encounter of 10/01/19 (from the past 24 hour(s))  Glucose, capillary     Status: Abnormal   Collection Time: 10/01/19  9:33 PM  Result Value Ref Range   Glucose-Capillary 63 (L) 70 - 99 mg/dL   Comment 1 Document in Chart   Glucose, capillary     Status: Abnormal   Collection Time: 10/01/19 10:32 PM  Result Value Ref Range    Glucose-Capillary 59 (L) 70 - 99 mg/dL   Comment 1 Document in Chart   Glucose, capillary     Status: None   Collection Time: 10/01/19 11:11 PM  Result Value Ref Range   Glucose-Capillary 75 70 - 99 mg/dL   Comment 1 Document in Chart   Basic metabolic panel     Status: Abnormal   Collection Time: 10/02/19  3:30 AM  Result Value Ref Range   Sodium 142 135 - 145 mmol/L   Potassium 5.4 (H) 3.5 - 5.1 mmol/L   Chloride 102 98 - 111 mmol/L   CO2 29 22 - 32 mmol/L   Glucose, Bld 89 70 - 99 mg/dL   BUN 14 6 - 20 mg/dL   Creatinine, Ser 4.06 (H) 0.61 - 1.24 mg/dL   Calcium 8.3 (L) 8.9 - 10.3 mg/dL   GFR calc non Af Amer 15 (L) >60 mL/min   GFR calc Af Amer 17 (L) >60 mL/min   Anion gap 11 5 - 15  CBC     Status: Abnormal   Collection Time: 10/02/19  3:30 AM  Result Value Ref Range   WBC 11.8 (H) 4.0 - 10.5 K/uL   RBC 3.49 (L) 4.22 - 5.81 MIL/uL   Hemoglobin 11.4 (L) 13.0 - 17.0 g/dL   HCT 36.8 (L) 39.0 - 52.0 %   MCV 105.4 (H) 80.0 - 100.0 fL   MCH 32.7 26.0 - 34.0 pg   MCHC 31.0 30.0 - 36.0 g/dL   RDW 14.2 11.5 - 15.5 %   Platelets 280 150 - 400 K/uL   nRBC 0.0 0.0 - 0.2 %  Glucose, capillary     Status: None   Collection Time: 10/02/19  5:40 AM  Result Value Ref Range   Glucose-Capillary 73 70 - 99 mg/dL   Comment 1 Document in Chart     Assessment/Plan: 59 year old male with ESRD currently on dialysis s/p fall, resulting in right distal femur fracture.  Patient has sustained a significant injury which will require surgical fixation.  Recommend proceeding with open reduction internal fixation of the right distal femur.  We will plan for this tomorrow morning.  Risk and benefits of the procedure have been discussed with the patient. Risks discussed included bleeding, infection, malunion, nonunion, damage to surrounding nerves and blood vessels, pain, hardware prominence or irritation, hardware failure, stiffness, post-traumatic arthritis, DVT/PE, compartment syndrome, and  anesthesia complications.  Patient states his understanding of these risks and agrees to proceed with surgery.  We will outpatient a diet today.  Will be n.p.o. effective midnight.  Continue NWB RLE.   Disa Riedlinger A. Carmie [facility] [phone] (office) [email or web address]

## 2019-10-02 NOTE — Evaluation (Signed)
Clinical/Bedside Swallow Evaluation Patient Details  Name: Steven Sellers MRN: [id number] Date of Birth: May 17, 1960  Today's Date: 10/02/2019 Time: SLP Start Time (ACUTE ONLY): 67 SLP Stop Time (ACUTE ONLY): 0948 SLP Time Calculation (min) (ACUTE ONLY): 16 min  Past Medical History:  Past Medical History:  Diagnosis Date  . Alcohol use   . Anemia   . ESRD (end stage renal disease) ([place])    Hemodialysis [facility]- [facility] in [place]  . Hypertension   . 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 Pulmonary embolism ([place]) 2011  . Seizures ([place]) 09/2016  . Tobacco abuse   . Vision abnormalities    Past Surgical History:  Past Surgical History:  Procedure Laterality Date  . A/V FISTULAGRAM Left 11/05/2016   Procedure: A/V Fistulagram;  Surgeon: Waynetta Sandy, MD;  Location: [facility];  Service: Cardiovascular;  Laterality: Left;  . A/V FISTULAGRAM Left 01/18/2018   Procedure: A/V FISTULAGRAM - left upper extremity;  Surgeon: Waynetta Sandy, MD;  Location: [place] [facility];  Service: Cardiovascular;  Laterality: Left;  . AV FISTULA PLACEMENT Left 12/26/2015   Procedure: CREATION OF LEFT RADIAL-CEPHALIC ARTERIOVENOUS FISTULA FOR HEMODIALYSIS;  Surgeon: Vickie Epley, MD;  Location: [facility];  Service: Vascular;  Laterality: Left;  . AV FISTULA PLACEMENT Left 04/28/2016   Procedure: REVISION OF LEFT RADIAL CEPHALIC ARTERIOVENOUS (AV) FISTULA  FOR DURABLE HEMODIALYSIS;  Surgeon: Vickie Epley, MD;  [facility]: [facility];  Service: Vascular;  Laterality: Left;  . CARDIAC SURGERY    . CENTRAL VENOUS CATHETER INSERTION Right 12/04/2015   Procedure: INSERTION OF TUNNELED HEMODIALYSIS CATHETER RIGHT INTERNAL JUGULAR;  Surgeon: Vickie Epley, MD;  Location[facility];  Service: General;  Laterality: Right;  . IR REMOVAL TUN CV CATH W/O FL  12/10/2016  . PERIPHERAL VASCULAR BALLOON ANGIOPLASTY Left 01/18/2018   Procedure: PERIPHERAL VASCULAR BALLOON ANGIOPLASTY;  Surgeon:  Waynetta Sandy, MD;  Location: [name] [facility];  Service: Cardiovascular;  Laterality: Left;  upper arm fistula  . REVISON OF ARTERIOVENOUS FISTULA Left 07/07/2016   Procedure: CREATION OF LEFT ARM BRACHIOCEPHALIC FISTULA;  Surgeon: Waynetta Sandy, MD;  Location: [facility];  Service: Vascular;  Laterality: Left;   HPI:  Steven Sellers is an 59 y.o. male being seen at the request of Dr. Stann Mainland for right distal femur fracture.  Patient fell while leaving dialysis.  Had immediate pain and deformity to the right knee and inability to ambulate.  Was evaluated in [facility] emergency department where imaging revealed a right distal femur fracture.  Orthopedics was consulted.  Patient transferred to [facility] for surgical fixation.   Assessment / Plan / Recommendation Clinical Impression  Pt was seen for a bedside swallow evaluation.  He was encountered awake/alert, moaning in pain.  RN was notified and she reported that she had recently given the pt pain medication.  Pt was noted to have congested-sounding respirations upon SLP arrival and prior to PO trials, he coughed up sputum to his oral cavity that was then suctioned.  Pt reported that he is occasionally congested at home and that he sometimes coughs when eating/drinking.  Pt was seen with trials of thin liquid and regular solids.  He exhibited a delayed throat clear following 1/5 straw sips of thin liquid; otherwise no overt s/sx of aspiration were observed with any trials.  Mastication of regular solids was mildly prolonged, likely secondary to missing dentition (partial dentures are at home).  Pt  politely refused additional PO trials secondary to pain.  Recommend diet change to Dysphagia 3 (soft) solids and thin liquids with medications administered whole in puree.  Will briefly f/u to monitor diet tolerance following surgery.    SLP Visit Diagnosis: Dysphagia, unspecified (R13.10)    Aspiration Risk  Mild aspiration risk     Diet Recommendation Dysphagia 3 (Mech soft);Thin liquid   Liquid Administration via: Cup;Straw Medication Administration: Whole meds with liquid Supervision: Staff to assist with self feeding Compensations: Slow rate;Small sips/bites Postural Changes: Seated upright at 90 degrees    Other  Recommendations Oral Care Recommendations: Oral care BID Other Recommendations: Have oral suction available   Follow up Recommendations None      Frequency and Duration min 2x/week  2 weeks       Prognosis Prognosis for Safe Diet Advancement: Good      Swallow Study   General HPI: Steven Sellers is an 59 y.o. male being seen at the request of Dr. Stann Mainland for right distal femur fracture.  Patient fell while leaving dialysis.  Had immediate pain and deformity to the right knee and inability to ambulate.  Was evaluated in [facility] emergency department where imaging revealed a right distal femur fracture.  Orthopedics was consulted.  Patient transferred to [facility] for surgical fixation. Type of Study: Bedside Swallow Evaluation Previous Swallow Assessment: None  Diet Prior to this Study: Regular;Thin liquids Temperature Spikes Noted: No Respiratory Status: Room air History of Recent Intubation: No Behavior/Cognition: Alert;Cooperative Oral Cavity Assessment: Within Functional Limits Oral Care Completed by SLP: No Oral Cavity - Dentition: Missing dentition(partial dentures at home ) Vision: Functional for self-feeding Self-Feeding Abilities: Needs assist Patient Positioning: Upright in bed Baseline Vocal Quality: Normal Volitional Swallow: Able to elicit    Oral/Motor/Sensory Function Overall Oral Motor/Sensory Function: Within functional limits   Ice Chips Ice chips: Not tested   Thin Liquid Thin Liquid: Impaired Presentation: Cup;Straw Pharyngeal  Phase Impairments: Throat Clearing - Delayed    Nectar Thick Nectar Thick Liquid: Not tested   Honey Thick Honey Thick Liquid: Not  tested   Puree Puree: Not tested(pt refused)   Solid     Solid: Impaired Presentation: Spoon Oral Phase Impairments: Impaired mastication Oral Phase Functional Implications: Prolonged oral transit;Impaired mastication     Colin Mulders M.S., CCC-SLP Acute Rehabilitation Services Office: [phone]   [place] 10/02/2019,9:57 AM

## 2019-10-02 NOTE — Consult Note (Signed)
Reason for Consult: ESRD Referring Physician:  Dr. Eliseo Squires  Chief Complaint: [place] with hip pain  Assessment/Plan: 1. ESRD - TTS [facility] 1st shift. - Will give Lokelma 10gm PO x1; recheck BMet in AM. If higher then will dialyze. O/w planning to resume TTS regimen on Tues. - Not sure about the fistula not working; according to the pt he had a full treatment yesterday. He had a stent placed recently to the outflow segment and the cannulation has a beautiful thrill and augments well too with no hyperpulsatile segments.   2. Anemia - @ goal 3. Renal osteodystrophy - Will check phos for binder management 4. Right distal femoral fracture - going to the OR tomorrow AM for an ORIF. 5. Muscular dystrophy 6. Chronic atrial fibrillation - rate control  7. HFpEF  With diastolic dysfunction, compensated at this time. 8. HTN   HPI: Steven Sellers is an 59 y.o. male muscular dystrophy HTN PE in 2012 chronic afib (refused anticoagulation previously) ESRD TTS [facility] presenting after sustaining a fall while getting out of a hemodialysis treatment on 10/01/2019 (Saturday). He states that he hit his head only on his leg and did not lose consciousness. He also denied any dizziness, dyspnea, CP, nausea, vomiting. He was found to have sustained a right distal femoral metadiaphysis fracture which was reduced in the ED but then transferred to [facility] from [facility]. Pt unable to ambulate or bear weight and has severe pain. He denies any weakness or tingling in the lower extremities. Pt seen by orthopedics with recommendation for an open reduction internal fixation of the right distal femur. Pt had a full dialysis treatment on Sat and left at 65.2kg.   ROS Pertinent items are noted in HPI.  Chemistry and CBC: Creatinine, Ser  Date/Time Value Ref Range Status  10/02/2019 03:30 AM 4.06 (H) 0.61 - 1.24 mg/dL Final  10/01/2019 11:13 AM 3.08 (H) 0.61 - 1.24 mg/dL Final  01/18/2018 07:26 AM 8.70 (H) 0.61 - 1.24  mg/dL Final  02/28/2017 08:30 AM 8.37 (H) 0.61 - 1.24 mg/dL Final    Comment:    DELTA CHECK NOTED DIALYSIS PT   02/26/2017 12:51 PM 4.27 (H) 0.61 - 1.24 mg/dL Final  11/05/2016 08:55 AM 6.40 (H) 0.61 - 1.24 mg/dL Final  09/27/2016 07:53 AM 6.76 (H) 0.61 - 1.24 mg/dL Final  09/26/2016 04:38 AM 5.22 (H) 0.61 - 1.24 mg/dL Final    Comment:    DELTA CHECK NOTED  09/25/2016 11:48 AM 3.11 (H) 0.61 - 1.24 mg/dL Final  05/20/2016 11:37 AM 6.88 (H) 0.61 - 1.24 mg/dL Final  05/19/2016 05:39 AM 9.74 (H) 0.61 - 1.24 mg/dL Final  05/19/2016 01:50 AM 9.55 (H) 0.61 - 1.24 mg/dL Final  04/28/2016 07:49 AM 6.10 (H) 0.61 - 1.24 mg/dL Final  12/17/2015 04:22 AM 2.49 (H) 0.61 - 1.24 mg/dL Final  12/17/2015 04:22 AM 2.51 (H) 0.61 - 1.24 mg/dL Final  12/15/2015 11:10 PM 2.47 (H) 0.61 - 1.24 mg/dL Final  12/06/2015 04:57 AM 5.45 (H) 0.61 - 1.24 mg/dL Final  12/05/2015 06:28 AM 4.19 (H) 0.61 - 1.24 mg/dL Final    Comment:    DELTA CHECK NOTED  12/04/2015 04:48 AM 6.70 (H) 0.61 - 1.24 mg/dL Final  12/03/2015 03:51 AM 9.21 (H) 0.61 - 1.24 mg/dL Final  12/02/2015 10:15 AM 9.65 (H) 0.61 - 1.24 mg/dL Final  12/02/2015 06:53 AM 9.49 (H) 0.61 - 1.24 mg/dL Final  12/01/2015 05:00 AM 9.35 (H) 0.61 - 1.24 mg/dL Final  11/30/2015 12:20  PM 9.41 (H) 0.61 - 1.24 mg/dL Final  05/13/2015 06:21 AM 1.87 (H) 0.61 - 1.24 mg/dL Final  05/12/2015 11:30 AM 2.24 (H) 0.61 - 1.24 mg/dL Final  04/05/2015 11:35 AM 2.12 (H) 0.61 - 1.24 mg/dL Final  04/22/2010 05:21 AM 9.64 (H) 0.4 - 1.5 mg/dL Final  04/21/2010 04:20 AM 8.51 (H) 0.4 - 1.5 mg/dL Final  04/20/2010 05:00 PM 7.85 (H) 0.4 - 1.5 mg/dL Final  04/19/2010 06:15 AM 9.26 (H) 0.4 - 1.5 mg/dL Final  04/18/2010 06:00 AM 8.16 (H) 0.4 - 1.5 mg/dL Final  04/17/2010 06:30 AM 6.96 (H) 0.4 - 1.5 mg/dL Final  04/16/2010 02:30 AM 9.18 (H) 0.4 - 1.5 mg/dL Final  04/15/2010 05:28 AM 8.07 (H) 0.4 - 1.5 mg/dL Final  04/14/2010 05:26 AM 6.73 (H) 0.4 - 1.5 mg/dL Final  04/13/2010 04:00  AM 6.15 (H) 0.4 - 1.5 mg/dL Final  04/12/2010 05:56 PM 5.63 (H) 0.4 - 1.5 mg/dL Final  04/12/2010 04:00 AM 4.50 (H) 0.4 - 1.5 mg/dL Final  04/11/2010 08:01 PM 5.92 (H) 0.4 - 1.5 mg/dL Final  04/11/2010 04:14 PM 5.74 (H) 0.4 - 1.5 mg/dL Final  04/11/2010 05:02 AM 5.27 (H) 0.4 - 1.5 mg/dL Final  04/10/2010 05:25 AM 4.00 (H) 0.4 - 1.5 mg/dL Final  04/09/2010 05:15 AM 2.92 (H) 0.4 - 1.5 mg/dL Final  04/07/2010 05:16 AM 1.11 0.4 - 1.5 mg/dL Final  04/06/2010 01:18 PM 0.85 0.4 - 1.5 mg/dL Final   Recent Labs  Lab 10/01/19 1113 10/02/19 0330  NA 139 142  K 4.2 5.4*  CL 99 102  CO2 30 29  GLUCOSE 62* 89  BUN 12 14  CREATININE 3.08* 4.06*  CALCIUM 8.1* 8.3*   Recent Labs  Lab 10/01/19 1113 10/02/19 0330  WBC 9.5 11.8*  NEUTROABS 7.3  --   HGB 12.4* 11.4*  HCT 39.6 36.8*  MCV 105.0* 105.4*  PLT 246 280   Liver Function Tests: Recent Labs  Lab 10/01/19 1113  AST 12*  ALT 11  ALKPHOS 65  BILITOT 0.5  PROT 5.0*  ALBUMIN 2.5*   No results for input(s): LIPASE, AMYLASE in the last 168 hours. No results for input(s): AMMONIA in the last 168 hours. Cardiac Enzymes: No results for input(s): CKTOTAL, CKMB, CKMBINDEX, TROPONINI in the last 168 hours. Iron Studies: No results for input(s): IRON, TIBC, TRANSFERRIN, FERRITIN in the last 72 hours. PT/INR: @LABRCNTIP (inr:5)  Xrays/Other Studies: ) Results for orders placed or performed during the hospital encounter of 10/01/19 (from the past 48 hour(s))  Glucose, capillary     Status: Abnormal   Collection Time: 10/01/19  9:33 PM  Result Value Ref Range   Glucose-Capillary 63 (L) 70 - 99 mg/dL    Comment: Glucose reference range applies only to samples taken after fasting for at least 8 hours.   Comment 1 Document in Chart   Glucose, capillary     Status: Abnormal   Collection Time: 10/01/19 10:32 PM  Result Value Ref Range   Glucose-Capillary 59 (L) 70 - 99 mg/dL    Comment: Glucose reference range applies only to samples  taken after fasting for at least 8 hours.   Comment 1 Document in Chart   Glucose, capillary     Status: None   Collection Time: 10/01/19 11:11 PM  Result Value Ref Range   Glucose-Capillary 75 70 - 99 mg/dL    Comment: Glucose reference range applies only to samples taken after fasting for at least 8 hours.   Comment 1  Document in Chart   Basic metabolic panel     Status: Abnormal   Collection Time: 10/02/19  3:30 AM  Result Value Ref Range   Sodium 142 135 - 145 mmol/L   Potassium 5.4 (H) 3.5 - 5.1 mmol/L   Chloride 102 98 - 111 mmol/L   CO2 29 22 - 32 mmol/L   Glucose, Bld 89 70 - 99 mg/dL    Comment: Glucose reference range applies only to samples taken after fasting for at least 8 hours.   BUN 14 6 - 20 mg/dL   Creatinine, Ser 4.06 (H) 0.61 - 1.24 mg/dL   Calcium 8.3 (L) 8.9 - 10.3 mg/dL   GFR calc non Af Amer 15 (L) >60 mL/min   GFR calc Af Amer 17 (L) >60 mL/min   Anion gap 11 5 - 15    Comment: Performed at [facility] [address], [place], [place] [address]  CBC     Status: Abnormal   Collection Time: 10/02/19  3:30 AM  Result Value Ref Range   WBC 11.8 (H) 4.0 - 10.5 K/uL   RBC 3.49 (L) 4.22 - 5.81 MIL/uL   Hemoglobin 11.4 (L) 13.0 - 17.0 g/dL   HCT 36.8 (L) 39.0 - 52.0 %   MCV 105.4 (H) 80.0 - 100.0 fL   MCH 32.7 26.0 - 34.0 pg   MCHC 31.0 30.0 - 36.0 g/dL   RDW 14.2 11.5 - 15.5 %   Platelets 280 150 - 400 K/uL   nRBC 0.0 0.0 - 0.2 %    Comment: Performed at [facility], [place] [address] [place], [place] [address]  Glucose, capillary     Status: None   Collection Time: 10/02/19  5:40 AM  Result Value Ref Range   Glucose-Capillary 73 70 - 99 mg/dL    Comment: Glucose reference range applies only to samples taken after fasting for at least 8 hours.   Comment 1 Document in Chart   Surgical pcr screen     Status: Abnormal   Collection Time: 10/02/19  6:58 AM   Specimen: Nasal Mucosa; Nasal Swab  Result Value Ref Range   MRSA, PCR NEGATIVE  NEGATIVE   Staphylococcus aureus POSITIVE (A) NEGATIVE    Comment: RESULT CALLED TO, READ BACK BY AND VERIFIED WITH: RN Jacob Moores KING 10/02/19 0920 KB (NOTE) The Xpert SA Assay (FDA approved for NASAL specimens in patients 78 years of age and older), is one component of a comprehensive surveillance program. It is not intended to diagnose infection nor to guide or monitor treatment.    DG Knee 1-2 Views Right  Result Date: 10/01/2019 CLINICAL DATA:  59 year old male with a history of leg injury EXAM: RIGHT KNEE - 1-2 VIEW COMPARISON:  None. FINDINGS: Comminuted fracture of the right femur at the distal metadiaphysis. Volar angulation is estimated 75 degrees. Associated soft tissue swelling. IMPRESSION: Single view of the distal femur demonstrates comminuted fracture of the distal femoral metadiaphysis with approximately 75 degree angulation. Electronically Signed   By: Corrie Mckusick D.O.   On: 10/01/2019 11:28   CT KNEE RIGHT WO CONTRAST  Result Date: 10/01/2019 CLINICAL DATA:  Fall today. Distal femur fracture. Hemodialysis patient. EXAM: CT OF THE RIGHT KNEE WITHOUT CONTRAST TECHNIQUE: Multidetector CT imaging of the right knee was performed according to the standard protocol. Multiplanar CT image reconstructions were also generated. COMPARISON:  Radiographs same date. FINDINGS: Bones/Joint/Cartilage The knee is splinted. There is a comminuted and displaced fracture of  the distal femoral metadiaphysis with improved alignment compared with the original radiographs. The fracture demonstrates up to 1.8 cm of impaction, and there are several large butterfly fragments both anteriorly and posteriorly. There is distal intra-articular extension of an intercondylar fracture which is essentially nondisplaced. This involves the central trochlea, but not the weight-bearing articular surfaces of either femoral condyle. The patella is located and intact. The proximal tibia and proximal fibula are intact. Moderate  size lipohemarthrosis. Ligaments Suboptimally assessed by CT. The cruciate ligaments and menisci are grossly intact. Muscles and Tendons Intact extensor mechanism. Mild muscular atrophy in the distal thigh. Soft tissues Soft tissue swelling around the distal femoral fracture without significant focal hematoma. No evidence of neurovascular injury. Femoropopliteal atherosclerosis noted. Linear calcifications within Hoffa's fat are likely vascular as well. IMPRESSION: 1. Comminuted and displaced fracture of the distal femoral metadiaphysis with improved alignment compared with the original radiographs. 2. Distal intra-articular extension of an intercondylar fracture which is essentially nondisplaced. No involvement of the weight-bearing articular surfaces of either femoral condyle. 3. Moderate size lipohemarthrosis. 4. No evidence of neurovascular injury. Electronically Signed   By: Richardean Sale M.D.   On: 10/01/2019 16:39   DG Chest Port 1 View  Result Date: 10/01/2019 CLINICAL DATA:  59 year old male with a history of femoral fracture EXAM: PORTABLE CHEST 1 VIEW COMPARISON:  06/09/2018 FINDINGS: Cardiomediastinal silhouette unchanged in size and contour. Low lung volumes. Improved aeration compared to the prior with coarsened interstitial markings. No pneumothorax or pleural effusion. No confluent airspace disease. Elevation of the right hemidiaphragm. No displaced fracture. IMPRESSION: Chronic lung changes with improved aeration compared to the prior chest x-ray and no evidence of acute cardiopulmonary disease Electronically Signed   By: Corrie Mckusick D.O.   On: 10/01/2019 13:49   DG Knee Right Port  Result Date: 10/01/2019 CLINICAL DATA:  Status post reduction EXAM: PORTABLE RIGHT KNEE - 2 VIEW COMPARISON:  Film from earlier in the same day. FINDINGS: Previously seen comminuted distal femoral fracture has been reduced and casted. Fracture fragments are in near anatomic alignment. The fracture line  extends to the articular surface of the distal femur. IMPRESSION: Status post reduction and casting. Electronically Signed   By: Inez Catalina M.D.   On: 10/01/2019 13:51    PMH:   Past Medical History:  Diagnosis Date  . Alcohol use   . Anemia   . ESRD (end stage renal disease) ([place])    Hemodialysis TTHS- [facility] in [place]  . Hypertension   . 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 Pulmonary embolism ([place]) 2011  . Seizures ([place]) 09/2016  . Tobacco abuse   . Vision abnormalities     PSH:   Past Surgical History:  Procedure Laterality Date  . A/V FISTULAGRAM Left 11/05/2016   Procedure: A/V Fistulagram;  Surgeon: Waynetta Sandy, MD;  Location: [facility];  Service: Cardiovascular;  Laterality: Left;  . A/V FISTULAGRAM Left 01/18/2018   Procedure: A/V FISTULAGRAM - left upper extremity;  Surgeon: Waynetta Sandy, MD;  Location: [facility];  Service: Cardiovascular;  Laterality: Left;  . AV FISTULA PLACEMENT Left 12/26/2015   Procedure: CREATION OF LEFT RADIAL-CEPHALIC ARTERIOVENOUS FISTULA FOR HEMODIALYSIS;  Surgeon: Vickie Epley, MD;  Location: [facility];  Service: Vascular;  Laterality: Left;  . AV FISTULA PLACEMENT Left 04/28/2016   Procedure: REVISION OF LEFT RADIAL CEPHALIC ARTERIOVENOUS (AV) FISTULA  FOR DURABLE HEMODIALYSIS;  Surgeon: Vickie Epley, MD;  Location: [facility]  ORS;  Service: Vascular;  Laterality: Left;  . CARDIAC SURGERY    . CENTRAL VENOUS CATHETER INSERTION Right 12/04/2015   Procedure: INSERTION OF TUNNELED HEMODIALYSIS CATHETER RIGHT INTERNAL JUGULAR;  Surgeon: Vickie Epley, MD;  Location: [facility];  Service: General;  Laterality: Right;  . IR REMOVAL TUN CV CATH W/O FL  12/10/2016  . PERIPHERAL VASCULAR BALLOON ANGIOPLASTY Left 01/18/2018   Procedure: PERIPHERAL VASCULAR BALLOON ANGIOPLASTY;  Surgeon: Waynetta Sandy, MD;  Location: [facility];  Service: Cardiovascular;  Laterality: Left;  upper arm fistula   . REVISON OF ARTERIOVENOUS FISTULA Left 07/07/2016   Procedure: CREATION OF LEFT ARM BRACHIOCEPHALIC FISTULA;  Surgeon: Waynetta Sandy, MD;  Location: [facility];  Service: Vascular;  Laterality: Left;    Allergies:  Allergies  Allergen Reactions  . Other Other (See Comments)    IV contrast- Renal issues    Medications:   Prior to Admission medications   Medication Sig Start Date End Date Taking? Authorizing Provider  acetaminophen (TYLENOL) 500 MG tablet Take 1,000 mg by mouth every 6 (six) hours as needed for mild pain or moderate pain.   Yes [provider]  lidocaine-prilocaine (EMLA) cream Apply 1 application topically once.  01/06/17  Yes [provider]  sevelamer carbonate (RENVELA) 0.8 g PACK packet Take 3.2 g by mouth 3 (three) times daily with meals.  01/05/17  Yes [provider]  traMADol (ULTRAM) 50 MG tablet Take 50 mg by mouth every 4 (four) hours as needed for moderate pain.  09/20/19  Yes [provider]  cefpodoxime (VANTIN) 200 MG tablet Take 1 tablet (200 mg total) by mouth daily. Patient not taking: Reported on 01/01/2018 02/28/17   Janece Canterbury, MD  diltiazem (CARDIZEM) 30 MG tablet Take 1 tablet (30 mg total) by mouth every 8 (eight) hours. Patient not taking: Reported on 01/01/2018 02/28/17   Janece Canterbury, MD  warfarin (COUMADIN) 2.5 MG tablet Take 2 tablets (5 mg total) by mouth daily at 6 PM. Patient not taking: Reported on 01/01/2018 02/28/17   Janece Canterbury, MD    Discontinued Meds:  There are no discontinued medications.  Social History:  reports that he has been smoking cigarettes. He started smoking about 45 years ago. He has a 20.00 pack-year smoking history. He has never used smokeless tobacco. He reports that he does not drink alcohol or use drugs.  Family History:   Family History  Problem Relation Age of Onset  . Hypertension Mother   . Heart attack Father        d/o MI at 37 yo   . Cancer Neg Hx    . Kidney disease Neg Hx     Blood pressure 127/88, pulse 100, temperature 97.8 F (36.6 C), temperature source Oral, resp. rate 18, SpO2 98 %. General appearance: alert, cooperative and appears stated age Head: Normocephalic, without obvious abnormality, atraumatic Eyes: negative Neck: no adenopathy, no carotid bruit, supple, symmetrical, trachea midline and thyroid not enlarged, symmetric, no tenderness/mass/nodules Back: symmetric, no curvature. ROM normal. No CVA tenderness. Resp: rhonchi bilaterally Cardio: Tachy GI: soft, non-tender; bowel sounds normal; no masses,  no organomegaly Extremities: edema left forearm, left leg wrapped Pulses: 2+ and symmetric Skin: Skin color, texture, turgor normal. No rashes or lesions Lymph nodes: Cervical adenopathy: none Access: LUA AVF       Dwana Melena, MD 10/02/2019, 1:45 PM

## 2019-10-03 ENCOUNTER — Encounter (HOSPITAL_COMMUNITY)
Admission: EM | Disposition: A | Discharge status: Skilled Nursing Facility | Payer: Self-pay | Source: Other Acute Inpatient Hospital | Attending: Internal Medicine

## 2019-10-03 ENCOUNTER — Inpatient Hospital Stay (HOSPITAL_COMMUNITY): Payer: Medicare Other

## 2019-10-03 ENCOUNTER — Inpatient Hospital Stay (HOSPITAL_COMMUNITY): Payer: Medicare Other | Admitting: Anesthesiology

## 2019-10-03 ENCOUNTER — Encounter (HOSPITAL_COMMUNITY): Payer: Self-pay | Admitting: Family Medicine

## 2019-10-03 ENCOUNTER — Inpatient Hospital Stay: Admit: 2019-10-03 | Payer: Medicare Other | Admitting: Student

## 2019-10-03 DIAGNOSIS — I4891 Unspecified atrial fibrillation: Secondary | ICD-10-CM | POA: Diagnosis not present

## 2019-10-03 HISTORY — PX: ORIF FEMUR FRACTURE: SHX2119

## 2019-10-03 LAB — BASIC METABOLIC PANEL
Anion gap: 14 (ref 5–15)
BUN: 24 mg/dL — ABNORMAL HIGH (ref 6–20)
CO2: 27 mmol/L (ref 22–32)
Calcium: 8.3 mg/dL — ABNORMAL LOW (ref 8.9–10.3)
Chloride: 98 mmol/L (ref 98–111)
Creatinine, Ser: 5.28 mg/dL — ABNORMAL HIGH (ref 0.61–1.24)
GFR calc Af Amer: 13 mL/min — ABNORMAL LOW (ref >60)
GFR calc non Af Amer: 11 mL/min — ABNORMAL LOW (ref >60)
Glucose, Bld: 78 mg/dL (ref 70–99)
Potassium: 6.1 mmol/L — ABNORMAL HIGH (ref 3.5–5.1)
Sodium: 139 mmol/L (ref 135–145)

## 2019-10-03 LAB — TYPE AND SCREEN
ABO/RH(D): A POS
Antibody Screen: NEGATIVE

## 2019-10-03 LAB — POCT I-STAT, CHEM 8
BUN: 26 mg/dL — ABNORMAL HIGH (ref 6–20)
Calcium, Ion: 1.13 mmol/L — ABNORMAL LOW (ref 1.15–1.40)
Chloride: 96 mmol/L — ABNORMAL LOW (ref 98–111)
Creatinine, Ser: 5.3 mg/dL — ABNORMAL HIGH (ref 0.61–1.24)
Glucose, Bld: 90 mg/dL (ref 70–99)
HCT: 29 % — ABNORMAL LOW (ref 39.0–52.0)
Hemoglobin: 9.9 g/dL — ABNORMAL LOW (ref 13.0–17.0)
Potassium: 5.5 mmol/L — ABNORMAL HIGH (ref 3.5–5.1)
Sodium: 138 mmol/L (ref 135–145)
TCO2: 29 mmol/L (ref 22–32)

## 2019-10-03 LAB — PHOSPHORUS: Phosphorus: 7.6 mg/dL — ABNORMAL HIGH (ref 2.5–4.6)

## 2019-10-03 SURGERY — OPEN REDUCTION INTERNAL FIXATION (ORIF) DISTAL FEMUR FRACTURE
Anesthesia: General | Site: Leg Upper | Laterality: Right

## 2019-10-03 MED ORDER — VANCOMYCIN HCL 1000 MG IV SOLR
INTRAVENOUS | Status: DC | PRN
  Administered 2019-10-03: 1000 mg

## 2019-10-03 MED ORDER — DILTIAZEM HCL 30 MG PO TABS
30.0000 mg | ORAL_TABLET | Freq: Three times a day (TID) | ORAL | Status: DC
  Administered 2019-10-03 – 2019-10-05 (×6): 30 mg via ORAL
  Filled 2019-10-03 (×7): qty 1

## 2019-10-03 MED ORDER — HYDROMORPHONE HCL 1 MG/ML IJ SOLN
INTRAMUSCULAR | Status: AC
  Filled 2019-10-03: qty 1

## 2019-10-03 MED ORDER — DOCUSATE SODIUM 100 MG PO CAPS
100.0000 mg | ORAL_CAPSULE | Freq: Two times a day (BID) | ORAL | Status: DC
  Administered 2019-10-03 – 2019-10-07 (×7): 100 mg via ORAL
  Filled 2019-10-03 (×8): qty 1

## 2019-10-03 MED ORDER — ACETAMINOPHEN 325 MG PO TABS
650.0000 mg | ORAL_TABLET | Freq: Four times a day (QID) | ORAL | Status: DC
  Administered 2019-10-04 – 2019-10-07 (×14): 650 mg via ORAL
  Filled 2019-10-03 (×14): qty 2

## 2019-10-03 MED ORDER — HYDROMORPHONE HCL 1 MG/ML IJ SOLN
0.2500 mg | INTRAMUSCULAR | Status: DC | PRN
  Administered 2019-10-03 (×2): 0.5 mg via INTRAVENOUS

## 2019-10-03 MED ORDER — GLYCOPYRROLATE PF 0.2 MG/ML IJ SOSY
PREFILLED_SYRINGE | INTRAMUSCULAR | Status: DC | PRN
  Administered 2019-10-03: .4 mg via INTRAVENOUS

## 2019-10-03 MED ORDER — CHLORHEXIDINE GLUCONATE CLOTH 2 % EX PADS
6.0000 | MEDICATED_PAD | Freq: Every day | CUTANEOUS | Status: DC
  Administered 2019-10-04: 6 via TOPICAL

## 2019-10-03 MED ORDER — SUGAMMADEX SODIUM 200 MG/2ML IV SOLN
INTRAVENOUS | Status: DC | PRN
  Administered 2019-10-03: 200 mg via INTRAVENOUS

## 2019-10-03 MED ORDER — PHENYLEPHRINE 40 MCG/ML (10ML) SYRINGE FOR IV PUSH (FOR BLOOD PRESSURE SUPPORT)
PREFILLED_SYRINGE | INTRAVENOUS | Status: DC | PRN
  Administered 2019-10-03: 80 ug via INTRAVENOUS

## 2019-10-03 MED ORDER — NEOSTIGMINE METHYLSULFATE 5 MG/5ML IV SOSY
PREFILLED_SYRINGE | INTRAVENOUS | Status: DC | PRN
  Administered 2019-10-03: 5 mg via INTRAVENOUS

## 2019-10-03 MED ORDER — SODIUM CHLORIDE (PF) 0.9 % IJ SOLN
INTRAMUSCULAR | Status: AC
  Filled 2019-10-03: qty 10

## 2019-10-03 MED ORDER — MIDAZOLAM HCL 5 MG/5ML IJ SOLN
INTRAMUSCULAR | Status: DC | PRN
  Administered 2019-10-03 (×2): 1 mg via INTRAVENOUS

## 2019-10-03 MED ORDER — HYDROMORPHONE HCL 1 MG/ML IJ SOLN
0.5000 mg | INTRAMUSCULAR | Status: DC | PRN
  Administered 2019-10-03 – 2019-10-04 (×2): 1 mg via INTRAVENOUS
  Filled 2019-10-03 (×3): qty 1

## 2019-10-03 MED ORDER — CHLORHEXIDINE GLUCONATE CLOTH 2 % EX PADS
6.0000 | MEDICATED_PAD | Freq: Every day | CUTANEOUS | Status: DC
  Administered 2019-10-04 – 2019-10-07 (×3): 6 via TOPICAL

## 2019-10-03 MED ORDER — NEOSTIGMINE METHYLSULFATE 3 MG/3ML IV SOSY
PREFILLED_SYRINGE | INTRAVENOUS | Status: AC
  Filled 2019-10-03: qty 9

## 2019-10-03 MED ORDER — VANCOMYCIN HCL 1000 MG IV SOLR
INTRAVENOUS | Status: AC
  Filled 2019-10-03: qty 1000

## 2019-10-03 MED ORDER — CEFAZOLIN SODIUM-DEXTROSE 1-4 GM/50ML-% IV SOLN
INTRAVENOUS | Status: DC | PRN
Start: 2019-10-03 — End: 2019-10-03
  Administered 2019-10-03: 1 g via INTRAVENOUS

## 2019-10-03 MED ORDER — ESMOLOL HCL 100 MG/10ML IV SOLN
INTRAVENOUS | Status: DC | PRN
  Administered 2019-10-03: 20 mg via INTRAVENOUS
  Administered 2019-10-03: 30 mg via INTRAVENOUS

## 2019-10-03 MED ORDER — METHOCARBAMOL 500 MG PO TABS
500.0000 mg | ORAL_TABLET | Freq: Four times a day (QID) | ORAL | Status: DC | PRN
  Administered 2019-10-04 – 2019-10-07 (×4): 500 mg via ORAL
  Filled 2019-10-03 (×4): qty 1

## 2019-10-03 MED ORDER — ONDANSETRON HCL 4 MG PO TABS: 4.0000 mg | ORAL_TABLET | Freq: Four times a day (QID) | ORAL | Status: DC | PRN

## 2019-10-03 MED ORDER — ONDANSETRON HCL 4 MG/2ML IJ SOLN
4.0000 mg | Freq: Four times a day (QID) | INTRAMUSCULAR | Status: DC | PRN
  Administered 2019-10-07: 4 mg via INTRAVENOUS
  Filled 2019-10-03: qty 2

## 2019-10-03 MED ORDER — FENTANYL CITRATE (PF) 250 MCG/5ML IJ SOLN
INTRAMUSCULAR | Status: AC
  Filled 2019-10-03: qty 5

## 2019-10-03 MED ORDER — SODIUM CHLORIDE 0.9 % IV SOLN: INTRAVENOUS | Status: DC | PRN

## 2019-10-03 MED ORDER — 0.9 % SODIUM CHLORIDE (POUR BTL) OPTIME
TOPICAL | Status: DC | PRN
  Administered 2019-10-03: 1000 mL

## 2019-10-03 MED ORDER — PHENYLEPHRINE HCL-NACL 10-0.9 MG/250ML-% IV SOLN
INTRAVENOUS | Status: DC | PRN
  Administered 2019-10-03: 25 ug/min via INTRAVENOUS

## 2019-10-03 MED ORDER — CEFAZOLIN SODIUM-DEXTROSE 2-4 GM/100ML-% IV SOLN
2.0000 g | Freq: Three times a day (TID) | INTRAVENOUS | Status: AC
  Administered 2019-10-03 – 2019-10-04 (×3): 2 g via INTRAVENOUS
  Filled 2019-10-03 (×3): qty 100

## 2019-10-03 MED ORDER — PROPOFOL 10 MG/ML IV BOLUS
INTRAVENOUS | Status: AC
  Filled 2019-10-03: qty 40

## 2019-10-03 MED ORDER — ROCURONIUM BROMIDE 10 MG/ML (PF) SYRINGE
PREFILLED_SYRINGE | INTRAVENOUS | Status: DC | PRN
  Administered 2019-10-03: 50 mg via INTRAVENOUS

## 2019-10-03 MED ORDER — SODIUM ZIRCONIUM CYCLOSILICATE 10 G PO PACK
10.0000 g | PACK | ORAL | Status: AC
  Filled 2019-10-03: qty 1

## 2019-10-03 MED ORDER — FENTANYL CITRATE (PF) 250 MCG/5ML IJ SOLN
INTRAMUSCULAR | Status: DC | PRN
  Administered 2019-10-03 (×4): 50 ug via INTRAVENOUS

## 2019-10-03 MED ORDER — ONDANSETRON HCL 4 MG/2ML IJ SOLN
INTRAMUSCULAR | Status: AC
  Filled 2019-10-03: qty 2

## 2019-10-03 MED ORDER — PROPOFOL 1000 MG/100ML IV EMUL
INTRAVENOUS | Status: AC
  Filled 2019-10-03: qty 100

## 2019-10-03 MED ORDER — PROPOFOL 10 MG/ML IV BOLUS
INTRAVENOUS | Status: DC | PRN
  Administered 2019-10-03 (×2): 50 mg via INTRAVENOUS

## 2019-10-03 MED ORDER — MIDAZOLAM HCL 2 MG/2ML IJ SOLN
INTRAMUSCULAR | Status: AC
  Filled 2019-10-03: qty 2

## 2019-10-03 MED ORDER — OXYCODONE HCL 5 MG PO TABS
5.0000 mg | ORAL_TABLET | ORAL | Status: DC | PRN
  Administered 2019-10-03 – 2019-10-05 (×8): 10 mg via ORAL
  Administered 2019-10-05: 5 mg via ORAL
  Administered 2019-10-06 – 2019-10-07 (×5): 10 mg via ORAL
  Filled 2019-10-03 (×8): qty 2
  Filled 2019-10-03: qty 1
  Filled 2019-10-03 (×4): qty 2

## 2019-10-03 MED ORDER — METHOCARBAMOL 1000 MG/10ML IJ SOLN
500.0000 mg | Freq: Four times a day (QID) | INTRAVENOUS | Status: DC | PRN
  Filled 2019-10-03: qty 5

## 2019-10-03 MED ORDER — CEFAZOLIN SODIUM 1 G IJ SOLR
INTRAMUSCULAR | Status: AC
  Filled 2019-10-03: qty 10

## 2019-10-03 MED ORDER — GLYCOPYRROLATE PF 0.2 MG/ML IJ SOSY
PREFILLED_SYRINGE | INTRAMUSCULAR | Status: AC
  Filled 2019-10-03: qty 2

## 2019-10-03 MED ORDER — DILTIAZEM HCL-DEXTROSE 125-5 MG/125ML-% IV SOLN (PREMIX)
5.0000 mg/h | INTRAVENOUS | Status: DC
  Administered 2019-10-03: 5 mg/h via INTRAVENOUS
  Filled 2019-10-03: qty 125

## 2019-10-03 MED ORDER — HEPARIN SODIUM (PORCINE) 5000 UNIT/ML IJ SOLN
5000.0000 [IU] | Freq: Three times a day (TID) | INTRAMUSCULAR | Status: DC
  Administered 2019-10-03 – 2019-10-07 (×10): 5000 [IU] via SUBCUTANEOUS
  Filled 2019-10-03 (×11): qty 1

## 2019-10-03 MED ORDER — METOCLOPRAMIDE HCL 5 MG PO TABS: 5.0000 mg | ORAL_TABLET | Freq: Three times a day (TID) | ORAL | Status: DC | PRN

## 2019-10-03 MED ORDER — DEXAMETHASONE SODIUM PHOSPHATE 10 MG/ML IJ SOLN
INTRAMUSCULAR | Status: DC | PRN
  Administered 2019-10-03: 5 mg via INTRAVENOUS

## 2019-10-03 MED ORDER — PROPOFOL 500 MG/50ML IV EMUL
INTRAVENOUS | Status: DC | PRN
  Administered 2019-10-03: 150 ug/kg/min via INTRAVENOUS

## 2019-10-03 MED ORDER — METOCLOPRAMIDE HCL 5 MG/ML IJ SOLN: 5.0000 mg | Freq: Three times a day (TID) | INTRAMUSCULAR | Status: DC | PRN

## 2019-10-03 MED ORDER — TOBRAMYCIN SULFATE 1.2 G IJ SOLR
INTRAMUSCULAR | Status: AC
  Filled 2019-10-03: qty 1.2

## 2019-10-03 MED ORDER — ONDANSETRON HCL 4 MG/2ML IJ SOLN
INTRAMUSCULAR | Status: DC | PRN
  Administered 2019-10-03: 4 mg via INTRAVENOUS

## 2019-10-03 MED ORDER — SODIUM ZIRCONIUM CYCLOSILICATE 10 G PO PACK
10.0000 g | PACK | Freq: Once | ORAL | Status: AC
  Administered 2019-10-03: 10 g via ORAL
  Filled 2019-10-03: qty 1

## 2019-10-03 MED ORDER — POLYETHYLENE GLYCOL 3350 17 G PO PACK: 17.0000 g | PACK | Freq: Every day | ORAL | Status: DC | PRN

## 2019-10-03 MED ORDER — DEXAMETHASONE SODIUM PHOSPHATE 10 MG/ML IJ SOLN
INTRAMUSCULAR | Status: AC
  Filled 2019-10-03: qty 1

## 2019-10-03 SURGICAL SUPPLY — 74 items
APL PRP STRL LF DISP 70% ISPRP (MISCELLANEOUS) ×1
BIT DRILL 4.3 (BIT) ×2
BIT DRILL 4.3X300MM (BIT) IMPLANT
BIT DRILL LONG 3.3 (BIT) ×1 IMPLANT
BIT DRILL QC 3.3X195 (BIT) ×1 IMPLANT
BLADE CLIPPER SURG (BLADE) IMPLANT
BNDG CMPR MED 10X6 ELC LF (GAUZE/BANDAGES/DRESSINGS) ×1
BNDG COHESIVE 6X5 TAN STRL LF (GAUZE/BANDAGES/DRESSINGS) ×2 IMPLANT
BNDG ELASTIC 6X10 VLCR STRL LF (GAUZE/BANDAGES/DRESSINGS) ×2 IMPLANT
BNDG GAUZE ELAST 4 BULKY (GAUZE/BANDAGES/DRESSINGS) ×1 IMPLANT
BRUSH SCRUB EZ PLAIN DRY (MISCELLANEOUS) ×3 IMPLANT
CANISTER SUCT 3000ML PPV (MISCELLANEOUS) ×1 IMPLANT
CAP LOCK NCB (Cap) ×7 IMPLANT
CHLORAPREP W/TINT 26 (MISCELLANEOUS) ×2 IMPLANT
COVER SURGICAL LIGHT HANDLE (MISCELLANEOUS) ×2 IMPLANT
COVER WAND RF STERILE (DRAPES) ×2 IMPLANT
DRAPE C-ARM 42X72 X-RAY (DRAPES) ×2 IMPLANT
DRAPE C-ARMOR (DRAPES) ×2 IMPLANT
DRAPE HALF SHEET 40X57 (DRAPES) ×2 IMPLANT
DRAPE ORTHO SPLIT 77X108 STRL (DRAPES) ×4
DRAPE SURG 17X23 STRL (DRAPES) ×2 IMPLANT
DRAPE SURG ORHT 6 SPLT 77X108 (DRAPES) ×2 IMPLANT
DRAPE U-SHAPE 47X51 STRL (DRAPES) ×2 IMPLANT
DRSG ADAPTIC 3X8 NADH LF (GAUZE/BANDAGES/DRESSINGS) IMPLANT
DRSG MEPILEX BORDER 4X12 (GAUZE/BANDAGES/DRESSINGS) IMPLANT
DRSG MEPILEX BORDER 4X4 (GAUZE/BANDAGES/DRESSINGS) ×1 IMPLANT
DRSG MEPILEX BORDER 4X8 (GAUZE/BANDAGES/DRESSINGS) ×2 IMPLANT
DRSG PAD ABDOMINAL 8X10 ST (GAUZE/BANDAGES/DRESSINGS) ×3 IMPLANT
ELECT REM PT RETURN 9FT ADLT (ELECTROSURGICAL) ×2
ELECTRODE REM PT RTRN 9FT ADLT (ELECTROSURGICAL) ×1 IMPLANT
GAUZE SPONGE 4X4 12PLY STRL (GAUZE/BANDAGES/DRESSINGS) ×2 IMPLANT
GAUZE SPONGE 4X4 12PLY STRL LF (GAUZE/BANDAGES/DRESSINGS) ×1 IMPLANT
GLOVE BIO SURGEON STRL SZ 6.5 (GLOVE) ×6 IMPLANT
GLOVE BIO SURGEON STRL SZ7.5 (GLOVE) ×8 IMPLANT
GLOVE BIOGEL PI IND STRL 6.5 (GLOVE) ×1 IMPLANT
GLOVE BIOGEL PI IND STRL 7.5 (GLOVE) ×1 IMPLANT
GLOVE BIOGEL PI INDICATOR 6.5 (GLOVE) ×1
GLOVE BIOGEL PI INDICATOR 7.5 (GLOVE) ×1
GOWN STRL REUS W/ TWL LRG LVL3 (GOWN DISPOSABLE) ×3 IMPLANT
GOWN STRL REUS W/TWL LRG LVL3 (GOWN DISPOSABLE) ×6
K-WIRE 2.0 (WIRE) ×2
K-WIRE FXSTD 280X2XNS SS (WIRE) ×1
KIT BASIN OR (CUSTOM PROCEDURE TRAY) ×2 IMPLANT
KIT TURNOVER KIT B (KITS) ×2 IMPLANT
KWIRE FXSTD 280X2XNS SS (WIRE) IMPLANT
NS IRRIG 1000ML POUR BTL (IV SOLUTION) ×2 IMPLANT
PACK TOTAL JOINT (CUSTOM PROCEDURE TRAY) ×2 IMPLANT
PAD ABD 8X10 STRL (GAUZE/BANDAGES/DRESSINGS) ×2 IMPLANT
PAD ARMBOARD 7.5X6 YLW CONV (MISCELLANEOUS) ×4 IMPLANT
PAD CAST 4YDX4 CTTN HI CHSV (CAST SUPPLIES) ×1 IMPLANT
PADDING CAST COTTON 4X4 STRL (CAST SUPPLIES) ×2
PADDING CAST COTTON 6X4 STRL (CAST SUPPLIES) ×2 IMPLANT
PLATE FEM DIST NCB PP 278MM (Plate) ×1 IMPLANT
SCREW 5.0 80MM (Screw) ×1 IMPLANT
SCREW CORTICAL 5.0X95MM (Screw) ×1 IMPLANT
SCREW CORTICAL NCB 5.0X44 (Screw) ×1 IMPLANT
SCREW CORTICAL NCB 5.0X90MM (Screw) ×1 IMPLANT
SCREW NCB 4.0MX44M (Screw) ×1 IMPLANT
SCREW NCB 5.0X46MM (Screw) ×1 IMPLANT
SCREW NCB 5.0X85MM (Screw) ×2 IMPLANT
SPONGE LAP 18X18 RF (DISPOSABLE) IMPLANT
STAPLER VISISTAT 35W (STAPLE) ×2 IMPLANT
SUCTION FRAZIER HANDLE 10FR (MISCELLANEOUS) ×2
SUCTION TUBE FRAZIER 10FR DISP (MISCELLANEOUS) ×1 IMPLANT
SUT ETHILON 3 0 PS 1 (SUTURE) ×4 IMPLANT
SUT VIC AB 0 CT1 27 (SUTURE) ×4
SUT VIC AB 0 CT1 27XBRD ANBCTR (SUTURE) IMPLANT
SUT VIC AB 1 CT1 27 (SUTURE)
SUT VIC AB 1 CT1 27XBRD ANBCTR (SUTURE) IMPLANT
SUT VIC AB 2-0 CT1 27 (SUTURE) ×4
SUT VIC AB 2-0 CT1 TAPERPNT 27 (SUTURE) ×2 IMPLANT
TOWEL GREEN STERILE (TOWEL DISPOSABLE) ×3 IMPLANT
TRAY FOLEY MTR SLVR 16FR STAT (SET/KITS/TRAYS/PACK) IMPLANT
WATER STERILE IRR 1000ML POUR (IV SOLUTION) ×4 IMPLANT

## 2019-10-03 NOTE — Op Note (Signed)
Orthopaedic Surgery Operative Note (CSN: [id number] ) Date of Surgery: 10/03/2019  Admit Date: 10/01/2019   Diagnoses: Pre-Op Diagnoses: Right supracondylar distal femur fracture with intercondylar extension   Post-Op Diagnosis: Same  Procedures: CPT 27513-Open reduction internal fixation of right supracondylar distal femur fracture  Surgeons : Primary: Shona Needles, MD  Assistant: Patrecia Pace, PA-C  Location: OR 3   Anesthesia:General  Antibiotics: Ancef 2g preop with 1 gm vancomycin powder placed topically   Tourniquet time:None  Estimated Blood ACZY:606 mL  Complications:None   Specimens:None   Implants: Implant Name Type Inv. Item Serial No. Manufacturer Lot No. LRB No. Used Action  SCREW 5.0 80MM - [id number] Screw SCREW 5.0 80MM  [employer](ORTH,TRAU,BIO,SG)  Right 1 Implanted  PLATE FEM DIST NCB PP 278MM - [id number] Plate PLATE FEM DIST NCB PP 278MM  [employer](ORTH,TRAU,BIO,SG)  Right 1 Implanted  SCREW NCB 5.0X85MM - [id number] Screw SCREW NCB 5.0X85MM  [employer](ORTH,TRAU,BIO,SG)  Right 2 Implanted  SCREW NCB 5.0X46MM - [id number] Screw SCREW NCB 5.0X46MM  [employer](ORTH,TRAU,BIO,SG)  Right 1 Implanted  SCREW CORTICAL NCB 5.0X44 - [id number] Screw SCREW CORTICAL NCB 5.0X44  [employer](ORTH,TRAU,BIO,SG)  Right 1 Implanted  SCREW CORTICAL 5.0X95MM - [id number] Screw SCREW CORTICAL 5.0X95MM  Z[employer]ON(ORTH,TRAU,BIO,SG) [id number] Right 1 Implanted  SCREW CORTICAL NCB 5.0X90MM - [id number] Screw SCREW CORTICAL NCB 5.0X90MM  [employer](ORTH,TRAU,BIO,SG) [id number] Right 1 Implanted  CAP LOCK NCB - [id number] Cap CAP LOCK NCB  [employer](ORTH,TRAU,BIO,SG)  Right 7 Implanted     Indications for Surgery: 59 year old male with end-stage renal disease on dialysis as well as muscular dystrophy that fell while at dialysis.  He sustained a right intra-articular distal femur fracture.  He was transferred from [facility] for duration.  Due to the complexity of his  injury orthopedic traumatologist was consulted.  I recommended proceeding with open reduction internal fixation.  Risks and benefits were discussed with the patient.  Risks included but not limited to bleeding, infection, malunion, nonunion, hardware failure, hardware irritation, nerve and blood vessel injury, posttraumatic arthritis, knee stiffness, DVT, even the possibility anesthetic complications.  He agreed to proceed with surgery and consent was obtained.  Operative Findings: Open reduction internal fixation of right intra-articular distal femur fracture using [employer] NCB 12 hole distal femoral locking plate  Procedure: The patient was identified in the preoperative holding area. Consent was confirmed with the patient and their family and all questions were answered. The operative extremity was marked after confirmation with the patient. he was then brought back to the operating room by our anesthesia colleagues.  He was then placed under general anesthetic and carefully transferred over to a radiolucent flat top table.  A bump was placed under his operative hip.  The right lower extremity was prepped and draped in usual sterile fashion.  A timeout was performed to verify the patient, the procedure, and the extremity.  Preoperative antibiotics were dosed.  Fluoroscopic imaging was obtained to show the unstable nature of his injury.  A lateral incision was carried down through skin and subcutaneous tissue.  I split the IT band in line with incision and mobilized the vastus lateralis to allow access to the lateral cortex of the femur.  I then used a Cobb elevator to develop a plane along and the femur itself.  I performed a reduction maneuver with the knee and hip flexed over a triangle.  I confirmed adequate rotation length and using fluoroscopy and then I slid a 12 hole it was held provisionally  with a K wire distally.  A percutaneous incision was made proximally and a 3.3 mm drill bit was used  to hold the position of the proximal portion of the plate.  I confirmed positioning of the plate I then placed a 5.0 mm screws in the distal segment to bring plate flush to bone.  I made percutaneous incisions and placed a 5.0 mm screws into the femoral shaft to find it.  This allowed the plate to brought flush to bone proximally.  Locking caps were placed on top of the 5.0 millimeter screws.  The 3.3 drill bit was removed and a screw was placed in the most proximal screw hole.  I returned to the distal segment and placed 3 more 5.0 millimeter screws.  The jig was removed.  Final fluoroscopic imaging was obtained.  Locking caps were placed on all of the distal screws.  The incisions were copiously irrigated.  A gram of vancomycin powder was placed into the incision.  Layered closure of 0 Vicryl for the IT band, 2-0 Vicryl and 3-0 nylon for the skin.  Sterile dressing was placed.  Patient was awoken from anesthesia and taken to the PACU in stable condition.  Post Op Plan/Instructions: Patient will be touchdown weightbearing to the right lower extremity.  He will receive postoperative Ancef.  He will be started on subcutaneous heparin for DVT prophylaxis.  We will have him mobilized with physical and occupational therapy.  I was present and performed the entire surgery.  Patrecia Pace, PA-C did assist me throughout the case. An assistant was necessary given the difficulty in approach, maintenance of reduction and ability to instrument the fracture.   Katha Hamming, MD [facility]

## 2019-10-03 NOTE — Interval H&P Note (Signed)
History and Physical Interval Note:  10/03/2019 7:19 AM  Steven Sellers  has presented today for surgery, with the diagnosis of Right distal femur fracture.  The various methods of treatment have been discussed with the patient and family. After consideration of risks, benefits and other options for treatment, the patient has consented to  Procedure(s): OPEN REDUCTION INTERNAL FIXATION (ORIF) DISTAL FEMUR FRACTURE (Right) as a surgical intervention.  The patient's history has been reviewed, patient examined, no change in status, stable for surgery.  I have reviewed the patient's chart and labs.  Questions were answered to the patient's satisfaction.     Lennette Bihari P [name]

## 2019-10-03 NOTE — Anesthesia Procedure Notes (Signed)
Central Venous Catheter Insertion Performed by: Roderic Palau, MD, anesthesiologist Start/End5/24/2021 7:00 AM, 10/03/2019 7:38 AM Patient location: Pre-op. Preanesthetic checklist: patient identified, IV checked, site marked, risks and benefits discussed, surgical consent, monitors and equipment checked, pre-op evaluation, timeout performed and anesthesia consent Position: Trendelenburg Lidocaine 1% used for infiltration and patient sedated Hand hygiene performed , maximum sterile barriers used  and Seldinger technique used Catheter size: 8 Fr Total catheter length 16. Central line was placed.Double lumen Procedure performed using ultrasound guided technique. Ultrasound Notes:anatomy identified, needle tip was noted to be adjacent to the nerve/plexus identified, no ultrasound evidence of intravascular and/or intraneural injection and image(s) printed for medical record Attempts: 4 (Attempted B IJ and L [place]. Unable to find vein([place]) or pass wire.) Following insertion, dressing applied, line sutured and Biopatch. Post procedure assessment: blood return through all ports  Patient tolerated the procedure well with no immediate complications.

## 2019-10-03 NOTE — Plan of Care (Signed)

## 2019-10-03 NOTE — Anesthesia Postprocedure Evaluation (Signed)
Anesthesia Post Note  Patient: Steven Sellers  Procedure(s) Performed: OPEN REDUCTION INTERNAL FIXATION (ORIF) DISTAL FEMUR FRACTURE (Right Leg Upper)     Patient location during evaluation: PACU Anesthesia Type: General Level of consciousness: awake and alert Pain management: pain level controlled Vital Signs Assessment: post-procedure vital signs reviewed and stable Respiratory status: spontaneous breathing, nonlabored ventilation, respiratory function stable and patient connected to nasal cannula oxygen Cardiovascular status: blood pressure returned to baseline and stable (Pt went into rapid Afib on emergence. Started on Cardizem drip. Now HR in 90's with good BP. Going to step down) Postop Assessment: no apparent nausea or vomiting Anesthetic complications: no    Last Vitals:  Vitals:   10/03/19 1030 10/03/19 1037  BP:  108/87  Pulse: (!) 149 (!) 35  Resp: 16 13  Temp:    SpO2:  98%    Last Pain:  Vitals:   10/03/19 1022  TempSrc:   PainSc: Asleep                 Keyira Mondesir,W. EDMOND

## 2019-10-03 NOTE — Anesthesia Procedure Notes (Signed)
Procedure Name: Intubation Date/Time: 10/03/2019 8:11 AM Performed by: Colin Benton, CRNA Pre-anesthesia Checklist: Patient identified, Emergency Drugs available, Patient being monitored and Suction available Patient Re-evaluated:Patient Re-evaluated prior to induction Oxygen Delivery Method: Circle system utilized Preoxygenation: Pre-oxygenation with 100% oxygen Induction Type: IV induction Ventilation: Mask ventilation without difficulty and Oral airway inserted - appropriate to patient size Laryngoscope Size: Sabra Heck and 3 Grade View: Grade I Tube type: Oral Tube size: 7.5 mm Number of attempts: 1 Airway Equipment and Method: Stylet Placement Confirmation: ETT inserted through vocal cords under direct vision,  positive ETCO2 and breath sounds checked- equal and bilateral Secured at: 23 cm Tube secured with: Tape Dental Injury: Teeth and Oropharynx as per pre-operative assessment

## 2019-10-03 NOTE — Plan of Care (Signed)
  Problem: Education: Goal: Knowledge of General Education information will improve Description: Including pain rating scale, medication(s)/side effects and non-pharmacologic comfort measures Outcome: Progressing   Problem: Activity: Goal: Risk for activity intolerance will decrease Outcome: Progressing   Problem: Nutrition: Goal: Adequate nutrition will be maintained Outcome: Progressing   

## 2019-10-03 NOTE — Progress Notes (Signed)
Progress Note    Steven Sellers  BVQ:[id number] DOB: Jul 23, 1960  DOA: 10/01/2019 PCP: Sharilyn Sites, MD    Brief Narrative:    Medical records reviewed and are as summarized below:  Steven Sellers is an 59 y.o. male with a past medical history significant for limb-girdle muscular dystrophy, end-stage renal disease on dialysis Tuesday Thursday Saturday schedule, hypertension, PE in 2012, chronic A. fib not on anticoagulation admitted May 22 with right femur fracture after mechanical fall at dialysis center.  Underwent ORIF May 24.  In postanesthesia care unit developed A. fib with RVR.  Cardizem drip initiated  Assessment/Plan:   Principal Problem:   Rt Distal Femur Comminuted/Angulated Fracture- Active Problems:   ESRD needing dialysis ([place])   Atrial fibrillation with RVR (HCC)   Essential hypertension   Muscle atrophy   Limb-girdle muscular dystrophy (HCC)   Chronic a-fib ----Declined Anti-coagulation due to Frequent Bruising  #1.  Right distal femur fracture.  Status post ORIF per Dr. Doreatha Martin orthopedics.  Patient suffered a mechanical fall at dialysis center.  X-ray of distal femur demonstrates a muted fracture of the distal femoral metadiaphysis.  He initially went to [facility] and was transferred to [facility] -Pain management -Other management per orthopedics  #2.  A. fib with RVR.  Home medications list Cardizem 30 mg every 8 hours.  Patient reports he has not taken that "in a very long time".  In the PACU he developed A. fib with RVR.  Cardizem drip initiated.  2 hours later heart rate was 88. -Resume p.o. Cardizem -Wean Cardizem drip as able -Monitor  #3.  End-stage renal disease.  He is a Tuesday Thursday Saturday dialysis patient in [place].  Chart review indicates patient did not finished his most recent dialysis session concerned about fistula working.  Evaluated by nephrology who opine that patient did have a full dialysis treatment and fistula with good  thrill -Likely continue regular dialysis schedule -Management per nephrology  #4.  Chronic congestive heart failure with preserved EF.  Echo 2017 55 to 80% with diastolic dysfunction.  Does not appear overloaded. -Volume management with dialysis -Continue home meds  #5.  Hypertension.  Fair control.  Home meds include Cardizem which patient indicates he is not taking. -Monitor  #6.  Limb-girdle muscular dystrophy.  Appears to be at baseline.  Reports his baseline ambulation is with a cane.  Chart review indicates patient had a wet sounding cough yesterday.  Chest x-ray reveals chronic lung changes with improved variation compared to prior chest x-ray and no evidence of acute cardiopulmonary disease.  Evaluated by speech therapy who opined mild aspiration risk and recommended dysphagia 3 diet. -dysphagia 3 diet with thin liquids  #7.  Tobacco use. -cessation counseling offered   Family Communication/Anticipated D/C date and plan/Code Status   DVT prophylaxis: per ortho. Code Status: Full Code.  Family Communication: patient at bedside Disposition Plan: Status is: Inpatient  Remains inpatient appropriate because:IV treatments appropriate due to intensity of illness or inability to take PO   Dispo: The patient is from: Home              Anticipated d/c is to: SNF              Anticipated d/c date is: 2 days              Patient currently is not medically stable to d/c.          Medical Consultants:    [place]  Anti-Infectives:    None  Subjective:   Awake somewhat drowsy in the postanesthesia care unit.  Denies pain.  Denies chest pain palpitations shortness of breath  Objective:    Vitals:   10/03/19 1136 10/03/19 1145 10/03/19 1152 10/03/19 1200  BP: (!) 132/91  130/81   Pulse: (!) 26 88 86 88  Resp: 17 13 11 16   Temp:      TempSrc:      SpO2: 100% 100% 100% 100%    Intake/Output Summary (Last 24 hours) at 10/03/2019 1231 Last data filed at 10/03/2019  8242 Gross per 24 hour  Intake 525 ml  Output 200 ml  Net 325 ml   There were no vitals filed for this visit.  Exam: General: Thin somewhat frail-appearing chronically ill-appearing very poor dentition CV irregularly irregular no murmur gallop or rub Respiratory: Respirations slightly shallow but regular good airflow breath sounds slightly diminished I hear no crackles Abdomen: Nondistended soft positive bowel sounds throughout nontender to palpation no guarding or rebounding Musculoskeletal: Dressing to right foot/ankle dry and intact toes warm to the touch otherwise joints without swelling/erythema Neuro: Awake lethargic arouses to verbal stimuli oriented to self and place speech clear facial symmetry  Data Reviewed:   I have personally reviewed following labs and imaging studies:  Labs: Labs show the following:   Basic Metabolic Panel: Recent Labs  Lab 10/01/19 1113 10/01/19 1113 10/02/19 0330 10/02/19 0330 10/03/19 0220 10/03/19 0654  NA 139  --  142  --  139 138  K 4.2   < > 5.4*   < > 6.1* 5.5*  CL 99  --  102  --  98 96*  CO2 30  --  29  --  27  --   GLUCOSE 62*  --  89  --  78 90  BUN 12  --  14  --  24* 26*  CREATININE 3.08*  --  4.06*  --  5.28* 5.30*  CALCIUM 8.1*  --  8.3*  --  8.3*  --   PHOS  --   --   --   --  7.6*  --    < > = values in this interval not displayed.   GFR Estimated Creatinine Clearance: 14 mL/min (A) (by C-G formula based on SCr of 5.3 mg/dL (H)). Liver Function Tests: Recent Labs  Lab 10/01/19 1113  AST 12*  ALT 11  ALKPHOS 65  BILITOT 0.5  PROT 5.0*  ALBUMIN 2.5*   No results for input(s): LIPASE, AMYLASE in the last 168 hours. No results for input(s): AMMONIA in the last 168 hours. Coagulation profile Recent Labs  Lab 10/01/19 1113  INR 0.9    CBC: Recent Labs  Lab 10/01/19 1113 10/02/19 0330 10/03/19 0654  WBC 9.5 11.8*  --   NEUTROABS 7.3  --   --   HGB 12.4* 11.4* 9.9*  HCT 39.6 36.8* 29.0*  MCV 105.0*  105.4*  --   PLT 246 280  --    Cardiac Enzymes: No results for input(s): CKTOTAL, CKMB, CKMBINDEX, TROPONINI in the last 168 hours. BNP (last 3 results) No results for input(s): PROBNP in the last 8760 hours. CBG: Recent Labs  Lab 10/01/19 2133 10/01/19 2232 10/01/19 2311 10/02/19 0540  GLUCAP 63* 59* 75 73   D-Dimer: No results for input(s): DDIMER in the last 72 hours. Hgb A1c: No results for input(s): HGBA1C in the last 72 hours. Lipid Profile: No results for input(s): CHOL, HDL, LDLCALC, TRIG,  CHOLHDL, LDLDIRECT in the last 72 hours. Thyroid function studies: No results for input(s): TSH, T4TOTAL, T3FREE, THYROIDAB in the last 72 hours.  Invalid input(s): FREET3 Anemia work up: No results for input(s): VITAMINB12, FOLATE, FERRITIN, TIBC, IRON, RETICCTPCT in the last 72 hours. Sepsis Labs: Recent Labs  Lab 10/01/19 1113 10/02/19 0330  WBC 9.5 11.8*    Microbiology Recent Results (from the past 240 hour(s))  SARS Coronavirus 2 by RT PCR (hospital order, performed in [facility] hospital lab) Nasopharyngeal Nasopharyngeal Swab     Status: None   Collection Time: 10/01/19 12:15 PM   Specimen: Nasopharyngeal Swab  Result Value Ref Range Status   SARS Coronavirus 2 NEGATIVE NEGATIVE Final    Comment: (NOTE) SARS-CoV-2 target nucleic acids are NOT DETECTED. The SARS-CoV-2 RNA is generally detectable in upper and lower respiratory specimens during the acute phase of infection. The lowest concentration of SARS-CoV-2 viral copies this assay can detect is 250 copies / mL. A negative result does not preclude SARS-CoV-2 infection and should not be used as the sole basis for treatment or other patient management decisions.  A negative result may occur with improper specimen collection / handling, submission of specimen other than nasopharyngeal swab, presence of viral mutation(s) within the areas targeted by this assay, and inadequate number of viral copies (<250 copies /  mL). A negative result must be combined with clinical observations, patient history, and epidemiological information. Fact Sheet for Patients:   StrictlyIdeas.no Fact Sheet for Healthcare Providers: BankingDealers.co.za This test is not yet approved or cleared  by the [place] FDA and has been authorized for detection and/or diagnosis of SARS-CoV-2 by FDA under an Emergency Use Authorization (EUA).  This EUA will remain in effect (meaning this test can be used) for the duration of the COVID-19 declaration under Section 564(b)(1) of the Act, 21 U.S.C. section 360bbb-3(b)(1), unless the authorization is terminated or revoked sooner. Performed at [facility], [address], [place], [place] [address]   Surgical pcr screen     Status: Abnormal   Collection Time: 10/02/19  6:58 AM   Specimen: Nasal Mucosa; Nasal Swab  Result Value Ref Range Status   MRSA, PCR NEGATIVE NEGATIVE Final   Staphylococcus aureus POSITIVE (A) NEGATIVE Final    Comment: RESULT CALLED TO, READ BACK BY AND VERIFIED WITH: RN Jacob Moores KING 10/02/19 0920 KB (NOTE) The Xpert SA Assay (FDA approved for NASAL specimens in patients 47 years of age and older), is one component of a comprehensive surveillance program. It is not intended to diagnose infection nor to guide or monitor treatment.     Procedures and diagnostic studies:  CT KNEE RIGHT WO CONTRAST  Result Date: 10/01/2019 CLINICAL DATA:  Fall today. Distal femur fracture. Hemodialysis patient. EXAM: CT OF THE RIGHT KNEE WITHOUT CONTRAST TECHNIQUE: Multidetector CT imaging of the right knee was performed according to the standard protocol. Multiplanar CT image reconstructions were also generated. COMPARISON:  Radiographs same date. FINDINGS: Bones/Joint/Cartilage The knee is splinted. There is a comminuted and displaced fracture of the distal femoral metadiaphysis with improved alignment compared with the original  radiographs. The fracture demonstrates up to 1.8 cm of impaction, and there are several large butterfly fragments both anteriorly and posteriorly. There is distal intra-articular extension of an intercondylar fracture which is essentially nondisplaced. This involves the central trochlea, but not the weight-bearing articular surfaces of either femoral condyle. The patella is located and intact. The proximal tibia and proximal fibula are intact. Moderate size lipohemarthrosis. Ligaments  Suboptimally assessed by CT. The cruciate ligaments and menisci are grossly intact. Muscles and Tendons Intact extensor mechanism. Mild muscular atrophy in the distal thigh. Soft tissues Soft tissue swelling around the distal femoral fracture without significant focal hematoma. No evidence of neurovascular injury. Femoropopliteal atherosclerosis noted. Linear calcifications within Hoffa's fat are likely vascular as well. IMPRESSION: 1. Comminuted and displaced fracture of the distal femoral metadiaphysis with improved alignment compared with the original radiographs. 2. Distal intra-articular extension of an intercondylar fracture which is essentially nondisplaced. No involvement of the weight-bearing articular surfaces of either femoral condyle. 3. Moderate size lipohemarthrosis. 4. No evidence of neurovascular injury. Electronically Signed   By: Richardean Sale M.D.   On: 10/01/2019 16:39   DG Chest Port 1 View  Result Date: 10/01/2019 CLINICAL DATA:  59 year old male with a history of femoral fracture EXAM: PORTABLE CHEST 1 VIEW COMPARISON:  06/09/2018 FINDINGS: Cardiomediastinal silhouette unchanged in size and contour. Low lung volumes. Improved aeration compared to the prior with coarsened interstitial markings. No pneumothorax or pleural effusion. No confluent airspace disease. Elevation of the right hemidiaphragm. No displaced fracture. IMPRESSION: Chronic lung changes with improved aeration compared to the prior chest  x-ray and no evidence of acute cardiopulmonary disease Electronically Signed   By: Corrie Mckusick D.O.   On: 10/01/2019 13:49   DG Knee Right Port  Result Date: 10/03/2019 CLINICAL DATA:  Right distal femur ORIF. EXAM: PORTABLE RIGHT KNEE - 1-2 VIEW COMPARISON:  Intraoperative x-rays from same day. CT right knee dated Oct 01, 2019. FINDINGS: Interval lateral plate and screw fixation of the comminuted distal femoral metadiaphyseal fracture, in near anatomic alignment. No evidence of hardware failure or loosening. Expected postsurgical changes and subcutaneous emphysema in the surrounding soft tissues and knee joint. IMPRESSION: Interval right distal femoral metadiaphyseal fracture ORIF in near anatomic alignment. Electronically Signed   By: Titus Dubin M.D.   On: 10/03/2019 10:26   DG Knee Right Port  Result Date: 10/01/2019 CLINICAL DATA:  Status post reduction EXAM: PORTABLE RIGHT KNEE - 2 VIEW COMPARISON:  Film from earlier in the same day. FINDINGS: Previously seen comminuted distal femoral fracture has been reduced and casted. Fracture fragments are in near anatomic alignment. The fracture line extends to the articular surface of the distal femur. IMPRESSION: Status post reduction and casting. Electronically Signed   By: Inez Catalina M.D.   On: 10/01/2019 13:51   DG C-Arm 1-60 Min  Result Date: 10/03/2019 CLINICAL DATA:  Distal femur fracture ORIF. EXAM: RIGHT FEMUR 2 VIEWS; DG C-ARM 1-60 MIN FLUOROSCOPY TIME:  57 seconds. C-arm fluoroscopic images were obtained intraoperatively and submitted for post operative interpretation. COMPARISON:  CT right knee dated Oct 01, 2019. FINDINGS: Multiple intraoperative fluoroscopic images demonstrate interval lateral plate and screw fixation of the comminuted right distal femoral metadiaphyseal fracture, in near anatomic alignment. IMPRESSION: Intraoperative fluoroscopic guidance for right distal femur fracture ORIF. Electronically Signed   By: Titus Dubin  M.D.   On: 10/03/2019 09:44   DG FEMUR, MIN 2 VIEWS RIGHT  Result Date: 10/03/2019 CLINICAL DATA:  Distal femur fracture ORIF. EXAM: RIGHT FEMUR 2 VIEWS; DG C-ARM 1-60 MIN FLUOROSCOPY TIME:  57 seconds. C-arm fluoroscopic images were obtained intraoperatively and submitted for post operative interpretation. COMPARISON:  CT right knee dated Oct 01, 2019. FINDINGS: Multiple intraoperative fluoroscopic images demonstrate interval lateral plate and screw fixation of the comminuted right distal femoral metadiaphyseal fracture, in near anatomic alignment. IMPRESSION: Intraoperative fluoroscopic guidance for right distal femur fracture  ORIF. Electronically Signed   By: Titus Dubin M.D.   On: 10/03/2019 09:44    Medications:   . [MAR Hold] Chlorhexidine Gluconate Cloth  6 each Topical Daily  . diltiazem  30 mg Oral Q8H  . HYDROmorphone      . [MAR Hold] lidocaine-prilocaine  1 application Topical Once  . [MAR Hold] sevelamer carbonate  3.2 g Oral TID WC  . [MAR Hold] sodium chloride flush  3 mL Intravenous Q12H  . sodium zirconium cyclosilicate  10 g Oral To SSTC   Continuous Infusions: . [MAR Hold] sodium chloride    . diltiazem (CARDIZEM) infusion 15 mg/hr (10/03/19 1045)  . methocarbamol (ROBAXIN) IV       LOS: 2 days   Radene Gunning NP  [facility]   How to contact the [facility] Attending or Consulting provider [place] or covering provider during after hours [place], for this patient?  1. Check the care team in [facility] and look for a) attending/consulting [facility] provider listed and b) the [facility] team listed 2. Log into [email or web address] and use [place]'s universal password to access. If you do not have the password, please contact the hospital operator. 3. Locate the [facility] provider you are looking for under [facility] and page to a number that you can be directly reached. 4. If you still have difficulty reaching the provider, please page the [facility] (Director on Call) for the Hospitalists  listed on amion for assistance.  10/03/2019, 12:31 PM

## 2019-10-03 NOTE — Progress Notes (Signed)
Patient went to Afib RVR post op, diltiazem ordered by Dr. Ola Spurr, notified Dr. Eliseo Squires for orders for progressive. Dr. Eliseo Squires to place orders.  Rowe Pavy, RN

## 2019-10-03 NOTE — Transfer of Care (Signed)
Immediate Anesthesia Transfer of Care Note  Patient: Steven Sellers  Procedure(s) Performed: OPEN REDUCTION INTERNAL FIXATION (ORIF) DISTAL FEMUR FRACTURE (Right Leg Upper)  Patient Location: PACU  Anesthesia Type:General  Level of Consciousness: awake, alert , oriented and patient cooperative  Airway & Oxygen Therapy: Patient Spontanous Breathing and Patient connected to nasal cannula oxygen  Post-op Assessment: Report given to RN and Post -op Vital signs reviewed and stable  Post vital signs: Reviewed and stable  Last Vitals:  Vitals Value Taken Time  BP 116/74 10/03/19 0943  Temp    Pulse 102 10/03/19 0946  Resp 22 10/03/19 0946  SpO2 95 % 10/03/19 0946  Vitals shown include unvalidated device data.  Last Pain:  Vitals:   10/03/19 0400  TempSrc: Oral  PainSc:       Patients Stated Pain Goal: 3 (92/90/90 3014)  Complications: No apparent anesthesia complications

## 2019-10-03 NOTE — Discharge Instructions (Signed)
[facility] Discharge Instructions   General Discharge Instructions  WEIGHT BEARING STATUS: Touchdown weightbearing right lower extremity  RANGE OF MOTION/ACTIVITY: okay for unrestricted knee motion as tolerated  Wound Care: Incisions can be left open to air if there is no drainage. If incision continues to have drainage, follow wound care instructions below. Okay to shower if no drainage from incisions.  DVT/PE prophylaxis: Lovenox x 30 days  Diet: as you were eating previously.  Can use over the counter stool softeners and bowel preparations, such as Miralax, to help with bowel movements.  Narcotics can be constipating.  Be sure to drink plenty of fluids  PAIN MEDICATION USE AND EXPECTATIONS  You have likely been given narcotic medications to help control your pain.  After a traumatic event that results in an fracture (broken bone) with or without surgery, it is ok to use narcotic pain medications to help control one's pain.  We understand that everyone responds to pain differently and each individual patient will be evaluated on a regular basis for the continued need for narcotic medications. Ideally, narcotic medication use should last no more than 6-8 weeks (coinciding with fracture healing).   As a patient it is your responsibility as well to monitor narcotic medication use and report the amount and frequency you use these medications when you come to your office visit.   We would also advise that if you are using narcotic medications, you should take a dose prior to therapy to maximize you participation.  IF YOU ARE ON NARCOTIC MEDICATIONS IT IS NOT PERMISSIBLE TO OPERATE A MOTOR VEHICLE (MOTORCYCLE/CAR/TRUCK/MOPED) OR HEAVY MACHINERY DO NOT MIX NARCOTICS WITH OTHER CNS (CENTRAL NERVOUS SYSTEM) DEPRESSANTS SUCH AS ALCOHOL   STOP SMOKING OR USING NICOTINE PRODUCTS!!!!  As discussed nicotine severely impairs your body's ability to heal surgical and traumatic wounds  but also impairs bone healing.  Wounds and bone heal by forming microscopic blood vessels (angiogenesis) and nicotine is a vasoconstrictor (essentially, shrinks blood vessels).  Therefore, if vasoconstriction occurs to these microscopic blood vessels they essentially disappear and are unable to deliver necessary nutrients to the healing tissue.  This is one modifiable factor that you can do to dramatically increase your chances of healing your injury.    (This means no smoking, no nicotine gum, patches, etc)  DO NOT USE NONSTEROIDAL ANTI-INFLAMMATORY DRUGS (NSAID'S)  Using products such as Advil (ibuprofen), Aleve (naproxen), Motrin (ibuprofen) for additional pain control during fracture healing can delay and/or prevent the healing response.  If you would like to take over the counter (OTC) medication, Tylenol (acetaminophen) is ok.  However, some narcotic medications that are given for pain control contain acetaminophen as well. Therefore, you should not exceed more than 4000 mg of tylenol in a day if you do not have liver disease.  Also note that there are may OTC medicines, such as cold medicines and allergy medicines that my contain tylenol as well.  If you have any questions about medications and/or interactions please ask your doctor/PA or your pharmacist.      ICE AND ELEVATE INJURED/OPERATIVE EXTREMITY  Using ice and elevating the injured extremity above your heart can help with swelling and pain control.  Icing in a pulsatile fashion, such as 20 minutes on and 20 minutes off, can be followed.    Do not place ice directly on skin. Make sure there is a barrier between to skin and the ice pack.    Using frozen items such as frozen peas  works well as the conform nicely to the are that needs to be iced.  USE AN ACE WRAP OR TED HOSE FOR SWELLING CONTROL  In addition to icing and elevation, Ace wraps or TED hose are used to help limit and resolve swelling.  It is recommended to use Ace wraps or TED  hose until you are informed to stop.    When using Ace Wraps start the wrapping distally (farthest away from the body) and wrap proximally (closer to the body)   Example: If you had surgery on your leg or thing and you do not have a splint on, start the ace wrap at the toes and work your way up to the thigh        If you had surgery on your upper extremity and do not have a splint on, start the ace wrap at your fingers and work your way up to the upper arm   [place]: [phone]   VISIT OUR WEBSITE FOR ADDITIONAL INFORMATION: [email or web address]     Discharge Wound Care Instructions  Do NOT apply any ointments, solutions or lotions to pin sites or surgical wounds.  These prevent needed drainage and even though solutions like hydrogen peroxide kill bacteria, they also damage cells lining the pin sites that help fight infection.  Applying lotions or ointments can keep the wounds moist and can cause them to breakdown and open up as well. This can increase the risk for infection. When in doubt call the office.  Surgical incisions should be dressed daily.  If any drainage is noted, use one layer of adaptic, then gauze, Kerlix, and an ace wrap.  Once the incision is completely dry and without drainage, it may be left open to air out.  Showering may begin 36-48 hours later.  Cleaning gently with soap and water.  Traumatic wounds should be dressed daily as well.    One layer of adaptic, gauze, Kerlix, then ace wrap.  The adaptic can be discontinued once the draining has ceased    If you have a wet to dry dressing: wet the gauze with saline the squeeze as much saline out so the gauze is moist (not soaking wet), place moistened gauze over wound, then place a dry gauze over the moist one, followed by Kerlix wrap, then ace wrap.

## 2019-10-04 ENCOUNTER — Encounter (HOSPITAL_COMMUNITY): Payer: Self-pay | Admitting: Family Medicine

## 2019-10-04 LAB — BASIC METABOLIC PANEL
Anion gap: 15 (ref 5–15)
BUN: 40 mg/dL — ABNORMAL HIGH (ref 6–20)
CO2: 26 mmol/L (ref 22–32)
Calcium: 7.9 mg/dL — ABNORMAL LOW (ref 8.9–10.3)
Chloride: 95 mmol/L — ABNORMAL LOW (ref 98–111)
Creatinine, Ser: 6.42 mg/dL — ABNORMAL HIGH (ref 0.61–1.24)
GFR calc Af Amer: 10 mL/min — ABNORMAL LOW (ref >60)
GFR calc non Af Amer: 9 mL/min — ABNORMAL LOW (ref >60)
Glucose, Bld: 128 mg/dL — ABNORMAL HIGH (ref 70–99)
Potassium: 5.9 mmol/L — ABNORMAL HIGH (ref 3.5–5.1)
Sodium: 136 mmol/L (ref 135–145)

## 2019-10-04 LAB — CBC
HCT: 23.7 % — ABNORMAL LOW (ref 39.0–52.0)
Hemoglobin: 7.6 g/dL — ABNORMAL LOW (ref 13.0–17.0)
MCH: 33 pg (ref 26.0–34.0)
MCHC: 32.1 g/dL (ref 30.0–36.0)
MCV: 103 fL — ABNORMAL HIGH (ref 80.0–100.0)
Platelets: 170 10*3/uL (ref 150–400)
RBC: 2.3 MIL/uL — ABNORMAL LOW (ref 4.22–5.81)
RDW: 14 % (ref 11.5–15.5)
WBC: 8.4 10*3/uL (ref 4.0–10.5)
nRBC: 0 % (ref 0.0–0.2)

## 2019-10-04 LAB — GLUCOSE, CAPILLARY
Glucose-Capillary: 115 mg/dL — ABNORMAL HIGH (ref 70–99)
Glucose-Capillary: 78 mg/dL (ref 70–99)
Glucose-Capillary: 115 mg/dL — ABNORMAL HIGH (ref 70–99)

## 2019-10-04 LAB — VITAMIN D 25 HYDROXY (VIT D DEFICIENCY, FRACTURES): Vit D, 25-Hydroxy: 9.01 ng/mL — ABNORMAL LOW (ref 30–100)

## 2019-10-04 LAB — HEPATITIS B SURFACE ANTIGEN: Hepatitis B Surface Ag: NONREACTIVE

## 2019-10-04 MED ORDER — SODIUM CHLORIDE 0.9% FLUSH: 10.0000 mL | INTRAVENOUS | Status: DC | PRN

## 2019-10-04 MED ORDER — OXYCODONE HCL 5 MG PO TABS
ORAL_TABLET | ORAL | Status: AC
  Filled 2019-10-04: qty 2

## 2019-10-04 NOTE — Plan of Care (Signed)
  Problem: Education: Goal: Knowledge of General Education information will improve Description: Including pain rating scale, medication(s)/side effects and non-pharmacologic comfort measures Outcome: Progressing   Problem: Pain Managment: Goal: General experience of comfort will improve Outcome: Progressing   Problem: Safety: Goal: Ability to remain free from injury will improve Outcome: Progressing   

## 2019-10-04 NOTE — Progress Notes (Signed)
Orthopaedic Trauma Progress Note  S: Patient had episode of A. Fib with RVR postoperatively yesterday, required IV Cardizem.  Rate now controlled on p.o. Cardizem.  Seen this morning in Hemodialysis unit.  Right leg feels better than it did prior to surgery but still painful, particularly with any movement.  Denies any significant numbness or tingling.  O:  Vitals:   10/04/19 0009 10/04/19 0515  BP: 102/69 103/85  Pulse: 71 75  Resp: 14 15  Temp: 98.1 F (36.7 C) 97.8 F (36.6 C)  SpO2: 100% 94%    General: Sitting up in bed, no acute distress Respiratory: No increased work of breathing.  Right lower extremity: Dressing is clean, dry, intact.  Tender with palpation of the distal femur/knee.  Does not tolerate any passive or active flexion of the knee.  Ankle dorsiflexion/plantarflexion is intact.  Able to wiggle each of his toes.  Sensation intact to light touch distally.+ DP pulse  Imaging: Stable post op imaging.   Labs:  Results for orders placed or performed during the hospital encounter of 10/01/19 (from the past 24 hour(s))  Type and screen [place]     Status: None   Collection Time: 10/03/19  7:46 AM  Result Value Ref Range   ABO/RH(D) A POS    Antibody Screen NEG    Sample Expiration      10/06/2019,2359 Performed at [facility], [place] [address], [place], [place] [address]   CBC     Status: Abnormal   Collection Time: 10/04/19  4:45 AM  Result Value Ref Range   WBC 8.4 4.0 - 10.5 K/uL   RBC 2.30 (L) 4.22 - 5.81 MIL/uL   Hemoglobin 7.6 (L) 13.0 - 17.0 g/dL   HCT 23.7 (L) 39.0 - 52.0 %   MCV 103.0 (H) 80.0 - 100.0 fL   MCH 33.0 26.0 - 34.0 pg   MCHC 32.1 30.0 - 36.0 g/dL   RDW 14.0 11.5 - 15.5 %   Platelets 170 150 - 400 K/uL   nRBC 0.0 0.0 - 0.2 %  Basic metabolic panel     Status: Abnormal   Collection Time: 10/04/19  4:45 AM  Result Value Ref Range   Sodium 136 135 - 145 mmol/L   Potassium 5.9 (H) 3.5 - 5.1 mmol/L   Chloride 95  (L) 98 - 111 mmol/L   CO2 26 22 - 32 mmol/L   Glucose, Bld 128 (H) 70 - 99 mg/dL   BUN 40 (H) 6 - 20 mg/dL   Creatinine, Ser 6.42 (H) 0.61 - 1.24 mg/dL   Calcium 7.9 (L) 8.9 - 10.3 mg/dL   GFR calc non Af Amer 9 (L) >60 mL/min   GFR calc Af Amer 10 (L) >60 mL/min   Anion gap 15 5 - 15    Assessment: 59 year old male s/p fall when leaving hemodialysis, 1 Day Post-Op   Injuries: Right distal femur fracture with intra-articular split s/p ORIF  Weightbearing: TDWB RLE  Insicional and dressing care: Plan to remove dressing tomorrow  Showering: Okay to begin showering on 10/06/2019 if incisions are C/D/I  Orthopedic device(s): None   CV/Blood loss: Acute blood loss anemia, Hgb 7.6 this morning. Hemodynamically stable currently.  Continue to monitor CBC  Pain management:  1. Tylenol 650 mg q 6 hours scheduled 2. Robaxin 500 mg q 6 hours PRN 3. Oxycodone 5-10 mg q 4 hours PRN 4. Dilaudid 0.5-1 mg q 4 hours PRN  VTE prophylaxis: Heparin, SCDs  ID:  Ancef 2gm post op  Foley/Lines:  No foley, continue IVFs  Medical co-morbidities: ESRD, hypertension, limb-girdle muscular dystrophy, chronic A. Fib,hx of PE in 2012  Impediments to Fracture Healing: Vitamin D level pending, will start supplementation as indicated  Dispo: PT/OT eval today.  Dispo pending.  Plan to change dressing tomorrow.  Continue to monitor CBC.    Follow - up plan: 2 weeks after hospital discharge for repeat x-rays and suture removal  Contact information:  Katha Hamming MD, Patrecia Pace PA-C   [facility] [phone] (office) [email or web address]

## 2019-10-04 NOTE — Progress Notes (Signed)
To Hemo via bed.

## 2019-10-04 NOTE — Progress Notes (Signed)
[facility] Progress Note   Subjective:   Tolerated HD with UF 2.5L this AM.  Seen in room after - having lots of post op pain and awaiting pain medications.  LUE with AVF looks swollen - he says its unchanged but due to pain isn't going into more details right now.   Objective Vitals:   10/04/19 1130 10/04/19 1200 10/04/19 1233 10/04/19 1319  BP: (!) 131/112 90/67 116/71 110/68  Pulse: 86 94 99 86  Resp:   16 17  Temp:   98 F (36.7 C) 98 F (36.7 C)  TempSrc:   Oral Oral  SpO2:   100% 96%  Weight:   65.7 kg   Height:       Physical Exam General: frail, thin, uncomfortable Heart: RRR Lungs: normal WOB on RA at rest Abdomen: soft, scaphoid Extremities: R leg with post op dressing, no ankle edema Dialysis Access: LUE AVF +t/b, 1+ edema   Additional Objective Labs: Basic Metabolic Panel: Recent Labs  Lab 10/02/19 0330 10/02/19 0330 10/03/19 0220 10/03/19 0654 10/04/19 0445  NA 142   < > 139 138 136  K 5.4*   < > 6.1* 5.5* 5.9*  CL 102   < > 98 96* 95*  CO2 29  --  27  --  26  GLUCOSE 89   < > 78 90 128*  BUN 14   < > 24* 26* 40*  CREATININE 4.06*   < > 5.28* 5.30* 6.42*  CALCIUM 8.3*  --  8.3*  --  7.9*  PHOS  --   --  7.6*  --   --    < > = values in this interval not displayed.   Liver Function Tests: Recent Labs  Lab 10/01/19 1113  AST 12*  ALT 11  ALKPHOS 65  BILITOT 0.5  PROT 5.0*  ALBUMIN 2.5*   No results for input(s): LIPASE, AMYLASE in the last 168 hours. CBC: Recent Labs  Lab 10/01/19 1113 10/01/19 1113 10/02/19 0330 10/03/19 0654 10/04/19 0445  WBC 9.5  --  11.8*  --  8.4  NEUTROABS 7.3  --   --   --   --   HGB 12.4*   < > 11.4* 9.9* 7.6*  HCT 39.6   < > 36.8* 29.0* 23.7*  MCV 105.0*  --  105.4*  --  103.0*  PLT 246  --  280  --  170   < > = values in this interval not displayed.   Blood Culture    Component Value Date/Time   SDES URINE, CLEAN CATCH 05/21/2016 0941   SPECREQUEST NONE 05/21/2016 0941   CULT  MULTIPLE SPECIES PRESENT, SUGGEST RECOLLECTION (A) 05/21/2016 0941   REPTSTATUS 05/23/2016 FINAL 05/21/2016 0941    Cardiac Enzymes: No results for input(s): CKTOTAL, CKMB, CKMBINDEX, TROPONINI in the last 168 hours. CBG: Recent Labs  Lab 10/01/19 2232 10/01/19 2311 10/02/19 0540 10/04/19 0738 10/04/19 1316  GLUCAP 59* 75 73 115* 78   Iron Studies: No results for input(s): IRON, TIBC, TRANSFERRIN, FERRITIN in the last 72 hours. @lablastinr3 @ Studies/Results: DG Knee Right Port  Result Date: 10/03/2019 CLINICAL DATA:  Right distal femur ORIF. EXAM: PORTABLE RIGHT KNEE - 1-2 VIEW COMPARISON:  Intraoperative x-rays from same day. CT right knee dated Oct 01, 2019. FINDINGS: Interval lateral plate and screw fixation of the comminuted distal femoral metadiaphyseal fracture, in near anatomic alignment. No evidence of hardware failure or loosening. Expected postsurgical changes and subcutaneous emphysema in the surrounding  soft tissues and knee joint. IMPRESSION: Interval right distal femoral metadiaphyseal fracture ORIF in near anatomic alignment. Electronically Signed   By: Titus Dubin M.D.   On: 10/03/2019 10:26   DG C-Arm 1-60 Min  Result Date: 10/03/2019 CLINICAL DATA:  Distal femur fracture ORIF. EXAM: RIGHT FEMUR 2 VIEWS; DG C-ARM 1-60 MIN FLUOROSCOPY TIME:  57 seconds. C-arm fluoroscopic images were obtained intraoperatively and submitted for post operative interpretation. COMPARISON:  CT right knee dated Oct 01, 2019. FINDINGS: Multiple intraoperative fluoroscopic images demonstrate interval lateral plate and screw fixation of the comminuted right distal femoral metadiaphyseal fracture, in near anatomic alignment. IMPRESSION: Intraoperative fluoroscopic guidance for right distal femur fracture ORIF. Electronically Signed   By: Titus Dubin M.D.   On: 10/03/2019 09:44   DG FEMUR, MIN 2 VIEWS RIGHT  Result Date: 10/03/2019 CLINICAL DATA:  Distal femur fracture ORIF. EXAM: RIGHT  FEMUR 2 VIEWS; DG C-ARM 1-60 MIN FLUOROSCOPY TIME:  57 seconds. C-arm fluoroscopic images were obtained intraoperatively and submitted for post operative interpretation. COMPARISON:  CT right knee dated Oct 01, 2019. FINDINGS: Multiple intraoperative fluoroscopic images demonstrate interval lateral plate and screw fixation of the comminuted right distal femoral metadiaphyseal fracture, in near anatomic alignment. IMPRESSION: Intraoperative fluoroscopic guidance for right distal femur fracture ORIF. Electronically Signed   By: Titus Dubin M.D.   On: 10/03/2019 09:44   Medications: . sodium chloride    . diltiazem (CARDIZEM) infusion Stopped (10/03/19 1345)  . methocarbamol (ROBAXIN) IV     . acetaminophen  650 mg Oral Q6H  . Chlorhexidine Gluconate Cloth  6 each Topical Daily  . Chlorhexidine Gluconate Cloth  6 each Topical Q0600  . diltiazem  30 mg Oral Q8H  . docusate sodium  100 mg Oral BID  . heparin injection (subcutaneous)  5,000 Units Subcutaneous Q8H  . sevelamer carbonate  3.2 g Oral TID WC  . sodium chloride flush  3 mL Intravenous Q12H    Dialysis Orders:  Assessment/Plan: 1. ESRD - TTS [facility] 1st shift.  Had HD today per schedule, plan to continue TIW while admitted.  Will need to ask more about his AVF swelling extremity tomorrow when he is able to give me more info.  It was used today without issues.   2. Anemia - Hb from 11s to 7.6 this AM.  Expect operative loss.  No other bleeding reported.  Monitor if transfusion needed.  Give ESA with next HD.  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 Right distal femoral fracture - s/p OR 5/24 5. Muscular dystrophy 6. Chronic atrial fibrillation - rate control  7. HFpEF  With diastolic dysfunction, compensated at this time. 8. HTN - BP low 9. A fib - had A fib with RVR post op - resolved with IV diltiazem and now on po.   Jannifer Hick MD 10/04/2019, 2:05 PM  [facility] Pager: [phone]

## 2019-10-04 NOTE — Progress Notes (Addendum)
Progress Note    Steven Sellers  OFB:[id number] DOB: 01/13/1961  DOA: 10/01/2019 PCP: Sharilyn Sites, MD    Brief Narrative:    Medical records reviewed and are as summarized below:  Steven Sellers is an 59 y.o. male past medical history that includes limb-girdle muscular dystrophy, end-stage renal disease on dialysis Tuesday Thursday Saturday, PE in 2012, chronic A. fib not on anticoagulation, hypertension admitted May 22 with right femur fracture after mechanical fall at the dialysis center.  Underwent ORIF May 24.  Assessment/Plan:   Principal Problem:   Closed displaced supracondylar fracture of distal end of right femur with intracondylar extension ([place]) Active Problems:   ESRD needing dialysis ([place])   Atrial fibrillation with RVR (HCC)   Essential hypertension   Muscle atrophy   Limb-girdle muscular dystrophy (HCC)   Anemia   Chronic a-fib ----Declined Anti-coagulation due to Frequent Bruising   #1.  Right distal femur fracture status post mechanical fall.  Underwent ORIF May 24. -Pain management -Further management per orthopedics  #2.  A. fib with RVR.  Resolved.  After surgery in the postanesthesia care unit he developed A. fib with RVR.  Cardizem drip was initiated.  After 2 hours heart rate controlled and home Cardizem ordered.  Heart rate remains controlled this morning. -continue home cardizem Monitor  #3.  End-stage renal disease.  He is a Tuesday Thursday Saturday dialysis patient in [place].  He is being dialyzed this morning.  Evaluated by nephrology who opined that patient did have a full dialysis treatment in his fistula is okay with a good thrill. -Dialysis per nephrology -Appreciate nephrology's assistance  #4.  Chronic congestive heart failure with preserved EF.  Echo in 2017 revealed EF of 55 to 78% with diastolic dysfunction.  Volume managed with dialysis. -Dialysis today as noted above -Continue home meds  #5.  Hypertension.  Fair control.   Home medications include Cardizem. -Cardizem as noted above -Dialysis as noted above  #6.  Limb-girdle muscular dystrophy.  Appears to be at baseline. -We will likely need placement given current injury  #7.  Cough.  She has a wet sounding nonproductive cough.  Some concern for aspiration given #6.  Rest sounds slightly diminished on exam.  Chest x-ray reveals chronic lung changes with improved variation compared to the prior study no evidence of acute cardiopulmonary disease.  Oxygen saturation level greater than 90% on room air. -Speech therapy eval to assess aspiration risk -As needed nebs -Monitor  #8.  Tobacco use. -cessation counseling  #9. Anemia. Hx of same but hg trending down. Likely related to surgery. Hg 7.6 today down from 9.9 yesterday. No s/sx active bleeding -will monitor -transfuse if continues to drop.    Family Communication/Anticipated D/C date and plan/Code Status   DVT prophylaxis: per ortho. Code Status: Full Code.  Family Communication: patient at bedside Disposition Plan: Status is: Inpatient  Remains inpatient appropriate because:Inpatient level of care appropriate due to severity of illness   Dispo: The patient is from: Home              Anticipated d/c is to: SNF              Anticipated d/c date is: 2 days              Patient currently is not medically stable to d/c.    Medical Consultants:   [facility]   Anti-Infectives:    None  Subjective:   Sitting up in  bed in the dialysis unit.  Requesting coffee.  Denies pain or discomfort.  Having a coughing jag due to "no coffee"  Objective:    Vitals:   10/04/19 0816 10/04/19 0820 10/04/19 0859 10/04/19 0930  BP: (!) 141/76 119/61 109/81 (!) 126/58  Pulse: 82 82 96 86  Resp: 16 14    Temp:      TempSrc:      SpO2:      Weight:      Height:        Intake/Output Summary (Last 24 hours) at 10/04/2019 1018 Last data filed at 10/04/2019 0500 Gross per 24 hour  Intake  880 ml  Output 0 ml  Net 880 ml   Filed Weights   10/03/19 1806 10/04/19 0805  Weight: 65.8 kg 68.4 kg    Exam: General: Thin somewhat frail chronically ill-appearing poor dentition but no acute distress CV: Irregularly irregular hear no murmur gallop or rub no lower extremity edema Respiratory: Mild increased work of breathing with conversation frequent wet sounding nonproductive cough.  Breath sounds somewhat diminished throughout I hear no crackles or wheezes Abdomen: Soft positive bowel sounds nontender to palpation no guarding or rebounding Musculoskeletal dressing to right foot ankle up to above the knee.  Toes warm to touch otherwise joints are free of swelling or erythema Neuro: Awake alert oriented x3 speech clear facial symmetry  Data Reviewed:   I have personally reviewed following labs and imaging studies:  Labs: Labs show the following:   Basic Metabolic Panel: Recent Labs  Lab 10/01/19 1113 10/01/19 1113 10/02/19 0330 10/02/19 0330 10/03/19 0220 10/03/19 0220 10/03/19 0654 10/04/19 0445  NA 139  --  142  --  139  --  138 136  K 4.2   < > 5.4*   < > 6.1*   < > 5.5* 5.9*  CL 99  --  102  --  98  --  96* 95*  CO2 30  --  29  --  27  --   --  26  GLUCOSE 62*  --  89  --  78  --  90 128*  BUN 12  --  14  --  24*  --  26* 40*  CREATININE 3.08*  --  4.06*  --  5.28*  --  5.30* 6.42*  CALCIUM 8.1*  --  8.3*  --  8.3*  --   --  7.9*  PHOS  --   --   --   --  7.6*  --   --   --    < > = values in this interval not displayed.   GFR Estimated Creatinine Clearance: 12 mL/min (A) (by C-G formula based on SCr of 6.42 mg/dL (H)). Liver Function Tests: Recent Labs  Lab 10/01/19 1113  AST 12*  ALT 11  ALKPHOS 65  BILITOT 0.5  PROT 5.0*  ALBUMIN 2.5*   No results for input(s): LIPASE, AMYLASE in the last 168 hours. No results for input(s): AMMONIA in the last 168 hours. Coagulation profile Recent Labs  Lab 10/01/19 1113  INR 0.9    CBC: Recent Labs    Lab 10/01/19 1113 10/02/19 0330 10/03/19 0654 10/04/19 0445  WBC 9.5 11.8*  --  8.4  NEUTROABS 7.3  --   --   --   HGB 12.4* 11.4* 9.9* 7.6*  HCT 39.6 36.8* 29.0* 23.7*  MCV 105.0* 105.4*  --  103.0*  PLT 246 280  --  170  Cardiac Enzymes: No results for input(s): CKTOTAL, CKMB, CKMBINDEX, TROPONINI in the last 168 hours. BNP (last 3 results) No results for input(s): PROBNP in the last 8760 hours. CBG: Recent Labs  Lab 10/01/19 2133 10/01/19 2232 10/01/19 2311 10/02/19 0540 10/04/19 0738  GLUCAP 63* 59* 75 73 115*   D-Dimer: No results for input(s): DDIMER in the last 72 hours. Hgb A1c: No results for input(s): HGBA1C in the last 72 hours. Lipid Profile: No results for input(s): CHOL, HDL, LDLCALC, TRIG, CHOLHDL, LDLDIRECT in the last 72 hours. Thyroid function studies: No results for input(s): TSH, T4TOTAL, T3FREE, THYROIDAB in the last 72 hours.  Invalid input(s): FREET3 Anemia work up: No results for input(s): VITAMINB12, FOLATE, FERRITIN, TIBC, IRON, RETICCTPCT in the last 72 hours. Sepsis Labs: Recent Labs  Lab 10/01/19 1113 10/02/19 0330 10/04/19 0445  WBC 9.5 11.8* 8.4    Microbiology Recent Results (from the past 240 hour(s))  SARS Coronavirus 2 by RT PCR (hospital order, performed in [facility] hospital lab) Nasopharyngeal Nasopharyngeal Swab     Status: None   Collection Time: 10/01/19 12:15 PM   Specimen: Nasopharyngeal Swab  Result Value Ref Range Status   SARS Coronavirus 2 NEGATIVE NEGATIVE Final    Comment: (NOTE) SARS-CoV-2 target nucleic acids are NOT DETECTED. The SARS-CoV-2 RNA is generally detectable in upper and lower respiratory specimens during the acute phase of infection. The lowest concentration of SARS-CoV-2 viral copies this assay can detect is 250 copies / mL. A negative result does not preclude SARS-CoV-2 infection and should not be used as the sole basis for treatment or other patient management decisions.  A negative  result may occur with improper specimen collection / handling, submission of specimen other than nasopharyngeal swab, presence of viral mutation(s) within the areas targeted by this assay, and inadequate number of viral copies (<250 copies / mL). A negative result must be combined with clinical observations, patient history, and epidemiological information. Fact Sheet for Patients:   StrictlyIdeas.no Fact Sheet for Healthcare Providers: BankingDealers.co.za This test is not yet approved or cleared  by the [place] FDA and has been authorized for detection and/or diagnosis of SARS-CoV-2 by FDA under an Emergency Use Authorization (EUA).  This EUA will remain in effect (meaning this test can be used) for the duration of the COVID-19 declaration under Section 564(b)(1) of the Act, 21 U.S.C. section 360bbb-3(b)(1), unless the authorization is terminated or revoked sooner. Performed at [facility], [address] [place], [place] [address]   Surgical pcr screen     Status: Abnormal   Collection Time: 10/02/19  6:58 AM   Specimen: Nasal Mucosa; Nasal Swab  Result Value Ref Range Status   MRSA, PCR NEGATIVE NEGATIVE Final   Staphylococcus aureus POSITIVE (A) NEGATIVE Final    Comment: RESULT CALLED TO, READ BACK BY AND VERIFIED WITH: RN Jacob Moores KING 10/02/19 0920 KB (NOTE) The Xpert SA Assay (FDA approved for NASAL specimens in patients 49 years of age and older), is one component of a comprehensive surveillance program. It is not intended to diagnose infection nor to guide or monitor treatment.     Procedures and diagnostic studies:  DG Knee Right Port  Result Date: 10/03/2019 CLINICAL DATA:  Right distal femur ORIF. EXAM: PORTABLE RIGHT KNEE - 1-2 VIEW COMPARISON:  Intraoperative x-rays from same day. CT right knee dated Oct 01, 2019. FINDINGS: Interval lateral plate and screw fixation of the comminuted distal femoral metadiaphyseal  fracture, in near anatomic alignment. No evidence of  hardware failure or loosening. Expected postsurgical changes and subcutaneous emphysema in the surrounding soft tissues and knee joint. IMPRESSION: Interval right distal femoral metadiaphyseal fracture ORIF in near anatomic alignment. Electronically Signed   By: Titus Dubin M.D.   On: 10/03/2019 10:26   DG C-Arm 1-60 Min  Result Date: 10/03/2019 CLINICAL DATA:  Distal femur fracture ORIF. EXAM: RIGHT FEMUR 2 VIEWS; DG C-ARM 1-60 MIN FLUOROSCOPY TIME:  57 seconds. C-arm fluoroscopic images were obtained intraoperatively and submitted for post operative interpretation. COMPARISON:  CT right knee dated Oct 01, 2019. FINDINGS: Multiple intraoperative fluoroscopic images demonstrate interval lateral plate and screw fixation of the comminuted right distal femoral metadiaphyseal fracture, in near anatomic alignment. IMPRESSION: Intraoperative fluoroscopic guidance for right distal femur fracture ORIF. Electronically Signed   By: Titus Dubin M.D.   On: 10/03/2019 09:44   DG FEMUR, MIN 2 VIEWS RIGHT  Result Date: 10/03/2019 CLINICAL DATA:  Distal femur fracture ORIF. EXAM: RIGHT FEMUR 2 VIEWS; DG C-ARM 1-60 MIN FLUOROSCOPY TIME:  57 seconds. C-arm fluoroscopic images were obtained intraoperatively and submitted for post operative interpretation. COMPARISON:  CT right knee dated Oct 01, 2019. FINDINGS: Multiple intraoperative fluoroscopic images demonstrate interval lateral plate and screw fixation of the comminuted right distal femoral metadiaphyseal fracture, in near anatomic alignment. IMPRESSION: Intraoperative fluoroscopic guidance for right distal femur fracture ORIF. Electronically Signed   By: Titus Dubin M.D.   On: 10/03/2019 09:44    Medications:   . acetaminophen  650 mg Oral Q6H  . Chlorhexidine Gluconate Cloth  6 each Topical Daily  . Chlorhexidine Gluconate Cloth  6 each Topical Q0600  . diltiazem  30 mg Oral Q8H  . docusate sodium   100 mg Oral BID  . heparin injection (subcutaneous)  5,000 Units Subcutaneous Q8H  . oxyCODONE      . sevelamer carbonate  3.2 g Oral TID WC  . sodium chloride flush  3 mL Intravenous Q12H   Continuous Infusions: . sodium chloride    . diltiazem (CARDIZEM) infusion Stopped (10/03/19 1345)  . methocarbamol (ROBAXIN) IV       LOS: 3 days   Radene Gunning NP  [facility]   How to contact the [facility] Attending or Consulting provider [place] or covering provider during after hours [place], for this patient?  1. Check the care team in [facility] and look for a) attending/consulting [facility] provider listed and b) the [facility] team listed 2. Log into [email or web address] and use [place]'s universal password to access. If you do not have the password, please contact the hospital operator. 3. Locate the [facility] provider you are looking for under [facility] and page to a number that you can be directly reached. 4. If you still have difficulty reaching the provider, please page the [facility] (Director on Call) for the Hospitalists listed on amion for assistance.  10/04/2019, 10:18 AM

## 2019-10-04 NOTE — Evaluation (Addendum)
Physical Therapy Evaluation Patient Details Name: Steven Sellers MRN: [id number] DOB: 1960/05/20 Today's Date: 10/04/2019   History of Present Illness  %59 year old male admitted with Displaced supracondylar fracture of distal end of right femur after fall. PMH includes: limb girdle MD, HTN, ESRD, Afib.  Clinical Impression  Patient received in bed, had been in HD all day. Patient somewhat agreeable to PT. Pain limited with mobility. He required increased time with all mobility. He required max to total assist with all bed mobility. He would have very difficult time maintaining TDWB even to get to recliner, therefore we practiced scooting in sitting along edge of bed. Patient required max +2 assist with this. He will continue to benefit from skilled PT while here to improve strength, functional independence and safety with mobility.      Follow Up Recommendations SNF;Supervision/Assistance - 24 hour    Equipment Recommendations  None recommended by PT(TBD)    Recommendations for Other Services       Precautions / Restrictions Precautions Precautions: Fall Restrictions Weight Bearing Restrictions: Yes RLE Weight Bearing: Touchdown weight bearing      Mobility  Bed Mobility Overal bed mobility: Needs Assistance Bed Mobility: Supine to Sit;Sit to Supine     Supine to sit: Max assist;Total assist;+2 for physical assistance Sit to supine: +2 for physical assistance;Total assist   General bed mobility comments: pain and resistance with mobility  Transfers                 General transfer comment: did not attempt  Ambulation/Gait             General Gait Details: unable  Stairs            Wheelchair Mobility    Modified Rankin (Stroke Patients Only)       Balance Overall balance assessment: Needs assistance   Sitting balance-Leahy Scale: Poor Sitting balance - Comments: initially patient required total assist for sitting edge of bed. All over the  place as far as postural control. Leaning forward then to side, then back/ Progressed to min guard but patient leaning to his left elbow in sitting                                     Pertinent Vitals/Pain Pain Assessment: Faces Faces Pain Scale: Hurts whole lot Pain Location: leg Pain Descriptors / Indicators: Discomfort;Grimacing;Moaning;Guarding Pain Intervention(s): Monitored during session;Premedicated before session;Repositioned;Relaxation    Home Living Family/patient expects to be discharged to:: Private residence Living Arrangements: Spouse/significant other Available Help at Discharge: Available 24 hours/day Type of Home: House Home Access: Stairs to enter   Technical brewer of Steps: 2 Home Layout: One level Home Equipment: Cane - single point      Prior Function Level of Independence: Needs assistance               Hand Dominance   Dominant Hand: Right    Extremity/Trunk Assessment   Upper Extremity Assessment Upper Extremity Assessment: Generalized weakness;Defer to OT evaluation    Lower Extremity Assessment Lower Extremity Assessment: Generalized weakness;RLE deficits/detail RLE: Unable to fully assess due to pain RLE Coordination: decreased gross motor    Cervical / Trunk Assessment Cervical / Trunk Assessment: Normal  Communication   Communication: No difficulties  Cognition Arousal/Alertness: Awake/alert Behavior During Therapy: WFL for tasks assessed/performed Overall Cognitive Status: No family/caregiver present to determine baseline cognitive functioning  General Comments: patient has definte thoughts on how to do things. Requires lots of cues and step by step instructions.      General Comments      Exercises     Assessment/Plan    PT Assessment Patient needs continued PT services  PT Problem List Decreased strength;Decreased mobility;Decreased safety  awareness;Decreased activity tolerance;Decreased balance;Decreased knowledge of use of DME;Decreased coordination;Decreased knowledge of precautions;Decreased cognition;Cardiopulmonary status limiting activity;Pain       PT Treatment Interventions DME instruction;Therapeutic exercise;Gait training;Functional mobility training;Therapeutic activities;Balance training;Patient/family education    PT Goals (Current goals can be found in the Care Plan section)  Acute Rehab PT Goals Patient Stated Goal: none stated PT Goal Formulation: With patient Time For Goal Achievement: 10/18/19 Potential to Achieve Goals: Fair    Frequency Min 3X/week   Barriers to discharge Inaccessible home environment;Decreased caregiver support      Co-evaluation PT/OT/SLP Co-Evaluation/Treatment: Yes Reason for Co-Treatment: Complexity of the patient's impairments (multi-system involvement);Necessary to address cognition/behavior during functional activity;For patient/therapist safety;To address functional/ADL transfers PT goals addressed during session: Mobility/safety with mobility;Balance;Proper use of DME         AM-PAC PT "6 Clicks" Mobility  Outcome Measure Help needed turning from your back to your side while in a flat bed without using bedrails?: Total Help needed moving from lying on your back to sitting on the side of a flat bed without using bedrails?: Total Help needed moving to and from a bed to a chair (including a wheelchair)?: Total Help needed standing up from a chair using your arms (e.g., wheelchair or bedside chair)?: Total Help needed to walk in hospital room?: Total Help needed climbing 3-5 steps with a railing? : Total 6 Click Score: 6    End of Session Equipment Utilized During Treatment: Gait belt Activity Tolerance: Patient limited by pain Patient left: in bed;with bed alarm set;with call bell/phone within reach Nurse Communication: Mobility status PT Visit Diagnosis: Other  abnormalities of gait and mobility (R26.89);History of falling (Z91.81);Muscle weakness (generalized) (M62.81);Pain Pain - Right/Left: Right Pain - part of body: Leg    Time: 2536-6440 PT Time Calculation (min) (ACUTE ONLY): 42 min   Charges:   PT Evaluation $PT Eval Moderate Complexity: 1 Mod PT Treatments $Therapeutic Activity: 8-22 mins        Najeeb Uptain, PT, GCS 10/04/19,3:49 PM

## 2019-10-04 NOTE — Progress Notes (Signed)
PT Cancellation Note  Patient Details Name: NENG ALBEE MRN: [id number] DOB: 1960/07/21   Cancelled Treatment:    Reason Eval/Treat Not Completed: Patient at procedure or test/unavailable. Patient has been in HD all morning. Will  Re-attempt this pm if patient available.    Hallie Ertl 10/04/2019, 11:58 AM

## 2019-10-04 NOTE — Progress Notes (Signed)
SLP Cancellation Note  Patient Details Name: [name] MRN: [id number] DOB: 11/13/1960   Cancelled treatment:       Reason Eval/Treat Not Completed: Patient at procedure or test/unavailable (HD).    Osie Bond., M.A. [facility] [facility] Pager [phone] Office [phone]  10/04/2019, 8:41 AM

## 2019-10-04 NOTE — Evaluation (Signed)
Occupational Therapy Evaluation Patient Details Name: Steven Sellers MRN: [id number] DOB: 01-Oct-1960 Today's Date: 10/04/2019    History of Present Illness 59 year old male admitted with Displaced supracondylar fracture of distal end of right femur after fall. PMH includes: limb girdle MD, HTN, ESRD, Afib.   Clinical Impression   PTA, pt was living at home with his wife, pt reports he has some assistance with ADL/IADL but was vague on how much and for what ADL. Pt reports he was using cane for functional mobility. Pt currently requires maxA+2 for bed mobility. He demonstrates cognitive limitations impacting his safety and independence with mobility. Pt demonstrated difficulty with adherence to TDWB during lateral scoot toward head of bed. Due to decline in current level of function, pt would benefit from acute OT to address established goals to facilitate safe D/C to venue listed below. At this time, recommend SNF follow-up. Will continue to follow acutely.     Follow Up Recommendations  SNF;Supervision/Assistance - 24 hour    Equipment Recommendations  3 in 1 bedside commode;Hospital bed    Recommendations for Other Services       Precautions / Restrictions Precautions Precautions: Fall Restrictions Weight Bearing Restrictions: Yes RLE Weight Bearing: Touchdown weight bearing      Mobility Bed Mobility Overal bed mobility: Needs Assistance Bed Mobility: Supine to Sit;Sit to Supine     Supine to sit: Max assist;Total assist;+2 for physical assistance Sit to supine: +2 for physical assistance;Total assist   General bed mobility comments: pain and resistance with mobility  Transfers Overall transfer level: Needs assistance   Transfers: Lateral/Scoot Transfers          Lateral/Scoot Transfers: +2 physical assistance;+2 safety/equipment;Max assist General transfer comment: attempted lateral scoot to [facility], pt required very heavy maxA+2     Balance Overall balance  assessment: Needs assistance   Sitting balance-Leahy Scale: Poor Sitting balance - Comments: initially patient required total assist for sitting edge of bed. All over the place as far as postural control. Leaning forward then to side, then back/ Progressed to min guard but patient leaning to his left elbow in sitting       Standing balance comment: deferred                           ADL either performed or assessed with clinical judgement   ADL Overall ADL's : Needs assistance/impaired Eating/Feeding: Minimal assistance;Sitting Eating/Feeding Details (indicate cue type and reason): requires assistance to prop BUE in supported positioning Grooming: Minimal assistance Grooming Details (indicate cue type and reason): assist to correctly position nasal canula Upper Body Bathing: Moderate assistance;Sitting   Lower Body Bathing: Maximal assistance;Sitting/lateral leans   Upper Body Dressing : Moderate assistance;Sitting   Lower Body Dressing: Maximal assistance;Sitting/lateral leans     Toilet Transfer Details (indicate cue type and reason): deferred   Toileting - Clothing Manipulation Details (indicate cue type and reason): deferred       General ADL Comments: pt limited to EOB due to decreased functional use of BUE and generalized weakness, RLE limitations, and cognitive limitations     Vision         Perception     Praxis      Pertinent Vitals/Pain Pain Assessment: Faces Faces Pain Scale: Hurts whole lot Pain Location: leg Pain Descriptors / Indicators: Discomfort;Grimacing;Moaning;Guarding Pain Intervention(s): Limited activity within patient's tolerance;Monitored during session     Hand Dominance Right   Extremity/Trunk Assessment Upper Extremity  Assessment Upper Extremity Assessment: Generalized weakness;RUE deficits/detail;LUE deficits/detail RUE Deficits / Details: limited shoulder flexion about 60*;full elbow flexion limited elbow extension;not  fully assessmed this date as pt appeared to become frustrated with therapist prompting him to complete MMT and ROM LUE Deficits / Details: same as RUE;requires BUE to be propped to access face and mouth during feeding   Lower Extremity Assessment Lower Extremity Assessment: Defer to PT evaluation RLE: Unable to fully assess due to pain RLE Coordination: decreased gross motor   Cervical / Trunk Assessment Cervical / Trunk Assessment: Normal   Communication Communication Communication: No difficulties   Cognition Arousal/Alertness: Awake/alert Behavior During Therapy: WFL for tasks assessed/performed Overall Cognitive Status: No family/caregiver present to determine baseline cognitive functioning                                 General Comments: pt very particular with sequencing and processing through mobility;pt with rigid thinking at times requiring max cues for sequencing new progression of task   General Comments  pt with nasal canula off upon arrival, SpO2 85% on RA;93% on 2lnc;following mobility 90% 2lnc, required 1 min pursed lip breathing to boost >92%    Exercises     Shoulder Instructions      Home Living Family/patient expects to be discharged to:: Private residence Living Arrangements: Spouse/significant other Available Help at Discharge: Available 24 hours/day Type of Home: House Home Access: Stairs to enter Technical brewer of Steps: 2   Home Layout: One level     Bathroom Shower/Tub: Teacher, early years/pre: Standard     Home Equipment: Cane - single point          Prior Functioning/Environment Level of Independence: Needs assistance  Gait / Transfers Assistance Needed: pt reports he was using a cane for mobility ADL's / Homemaking Assistance Needed: pt unclear with level of assistance needed, reports he did have some assistance            OT Problem List: Decreased strength;Decreased range of motion;Decreased  activity tolerance;Impaired balance (sitting and/or standing);Decreased cognition;Decreased safety awareness;Decreased knowledge of use of DME or AE;Decreased knowledge of precautions;Cardiopulmonary status limiting activity;Impaired UE functional use;Pain      OT Treatment/Interventions: Self-care/ADL training;Therapeutic exercise;Neuromuscular education;Energy conservation;DME and/or AE instruction;Therapeutic activities;Cognitive remediation/compensation;Patient/family education;Balance training    OT Goals(Current goals can be found in the care plan section) Acute Rehab OT Goals Patient Stated Goal: to sit in the chair/get out of bed OT Goal Formulation: With patient Time For Goal Achievement: 10/18/19 Potential to Achieve Goals: Good ADL Goals Pt Will Perform Eating: with set-up;with adaptive utensils;sitting Pt Will Perform Grooming: with set-up;with adaptive equipment;sitting Pt Will Transfer to Toilet: with mod assist;stand pivot transfer Additional ADL Goal #1: Pt will complete bed mobility with minA in preparation for ADL/IADL and functional mobility.  OT Frequency: Min 2X/week   Barriers to D/C: Inaccessible home environment  pt has stairs to enter house       Co-evaluation PT/OT/SLP Co-Evaluation/Treatment: Yes Reason for Co-Treatment: Complexity of the patient's impairments (multi-system involvement);For patient/therapist safety;To address functional/ADL transfers PT goals addressed during session: Mobility/safety with mobility;Balance;Proper use of DME OT goals addressed during session: ADL's and self-care      AM-PAC OT "6 Clicks" Daily Activity     Outcome Measure Help from another person eating meals?: A Little Help from another person taking care of personal grooming?: A Little Help from another person toileting,  which includes using toliet, bedpan, or urinal?: Total Help from another person bathing (including washing, rinsing, drying)?: A Lot Help from another  person to put on and taking off regular upper body clothing?: A Little Help from another person to put on and taking off regular lower body clothing?: A Lot 6 Click Score: 14   End of Session Equipment Utilized During Treatment: Oxygen Nurse Communication: Mobility status  Activity Tolerance: Patient tolerated treatment well;Patient limited by pain Patient left: in bed;with call bell/phone within reach;with bed alarm set  OT Visit Diagnosis: Unsteadiness on feet (R26.81);Other abnormalities of gait and mobility (R26.89);Muscle weakness (generalized) (M62.81);History of falling (Z91.81);Other symptoms and signs involving cognitive function;Pain Pain - Right/Left: Right Pain - part of body: Leg                Time: 1980-2217 OT Time Calculation (min): 40 min Charges:  OT General Charges $OT Visit: 1 Visit OT Evaluation $OT Eval Moderate Complexity: [place] OTR/L Acute Rehabilitation Services Office: [place] 10/04/2019, 4:15 PM

## 2019-10-05 ENCOUNTER — Encounter: Payer: Self-pay | Admitting: *Deleted

## 2019-10-05 DIAGNOSIS — N186 End stage renal disease: Secondary | ICD-10-CM

## 2019-10-05 DIAGNOSIS — I482 Chronic atrial fibrillation, unspecified: Secondary | ICD-10-CM

## 2019-10-05 DIAGNOSIS — M6259 Muscle wasting and atrophy, not elsewhere classified, multiple sites: Secondary | ICD-10-CM

## 2019-10-05 DIAGNOSIS — Z992 Dependence on renal dialysis: Secondary | ICD-10-CM

## 2019-10-05 DIAGNOSIS — I4891 Unspecified atrial fibrillation: Secondary | ICD-10-CM

## 2019-10-05 DIAGNOSIS — D649 Anemia, unspecified: Secondary | ICD-10-CM

## 2019-10-05 DIAGNOSIS — I10 Essential (primary) hypertension: Secondary | ICD-10-CM

## 2019-10-05 DIAGNOSIS — G7109 Other specified muscular dystrophies: Secondary | ICD-10-CM

## 2019-10-05 DIAGNOSIS — S72461A Displaced supracondylar fracture with intracondylar extension of lower end of right femur, initial encounter for closed fracture: Principal | ICD-10-CM

## 2019-10-05 LAB — BASIC METABOLIC PANEL
Anion gap: 12 (ref 5–15)
BUN: 19 mg/dL (ref 6–20)
CO2: 29 mmol/L (ref 22–32)
Calcium: 8.2 mg/dL — ABNORMAL LOW (ref 8.9–10.3)
Chloride: 95 mmol/L — ABNORMAL LOW (ref 98–111)
Creatinine, Ser: 3.29 mg/dL — ABNORMAL HIGH (ref 0.61–1.24)
GFR calc Af Amer: 23 mL/min — ABNORMAL LOW (ref >60)
GFR calc non Af Amer: 19 mL/min — ABNORMAL LOW (ref >60)
Glucose, Bld: 108 mg/dL — ABNORMAL HIGH (ref 70–99)
Potassium: 3.8 mmol/L (ref 3.5–5.1)
Sodium: 136 mmol/L (ref 135–145)

## 2019-10-05 LAB — GLUCOSE, CAPILLARY
Glucose-Capillary: 82 mg/dL (ref 70–99)
Glucose-Capillary: 82 mg/dL (ref 70–99)
Glucose-Capillary: 98 mg/dL (ref 70–99)

## 2019-10-05 LAB — CBC
HCT: 24.2 % — ABNORMAL LOW (ref 39.0–52.0)
Hemoglobin: 7.5 g/dL — ABNORMAL LOW (ref 13.0–17.0)
MCH: 32.5 pg (ref 26.0–34.0)
MCHC: 31 g/dL (ref 30.0–36.0)
MCV: 104.8 fL — ABNORMAL HIGH (ref 80.0–100.0)
Platelets: 178 10*3/uL (ref 150–400)
RBC: 2.31 MIL/uL — ABNORMAL LOW (ref 4.22–5.81)
RDW: 13.8 % (ref 11.5–15.5)
WBC: 8.7 10*3/uL (ref 4.0–10.5)
nRBC: 0 % (ref 0.0–0.2)

## 2019-10-05 LAB — SARS CORONAVIRUS 2 BY RT PCR (HOSPITAL ORDER, PERFORMED IN ~~LOC~~ HOSPITAL LAB): SARS Coronavirus 2: NEGATIVE

## 2019-10-05 MED ORDER — DARBEPOETIN ALFA 100 MCG/0.5ML IJ SOSY
100.0000 ug | PREFILLED_SYRINGE | INTRAMUSCULAR | Status: DC
  Filled 2019-10-05: qty 0.5

## 2019-10-05 MED ORDER — DILTIAZEM HCL 60 MG PO TABS
60.0000 mg | ORAL_TABLET | Freq: Four times a day (QID) | ORAL | Status: AC
  Administered 2019-10-05 – 2019-10-07 (×7): 60 mg via ORAL
  Filled 2019-10-05: qty 2
  Filled 2019-10-05 (×6): qty 1

## 2019-10-05 MED ORDER — HEPARIN SODIUM (PORCINE) 5000 UNIT/ML IJ SOLN: 5000.0000 [IU] | Freq: Three times a day (TID) | INTRAMUSCULAR | 0 refills | Status: DC

## 2019-10-05 MED ORDER — SODIUM CHLORIDE 0.9 % IV BOLUS: 500.0000 mL | Freq: Once | INTRAVENOUS | Status: DC

## 2019-10-05 MED ORDER — VITAMIN D (ERGOCALCIFEROL) 1.25 MG (50000 UNIT) PO CAPS
50000.0000 [IU] | ORAL_CAPSULE | ORAL | Status: DC
  Filled 2019-10-05: qty 1

## 2019-10-05 MED ORDER — VITAMIN D 25 MCG (1000 UNIT) PO TABS
2000.0000 [IU] | ORAL_TABLET | Freq: Every day | ORAL | Status: DC
  Administered 2019-10-05 – 2019-10-07 (×3): 2000 [IU] via ORAL
  Filled 2019-10-05 (×3): qty 2

## 2019-10-05 MED ORDER — METHOCARBAMOL 500 MG PO TABS: 500.0000 mg | ORAL_TABLET | Freq: Four times a day (QID) | ORAL | 0 refills | Status: AC | PRN

## 2019-10-05 MED ORDER — METOPROLOL TARTRATE 5 MG/5ML IV SOLN
5.0000 mg | INTRAVENOUS | Status: AC
  Administered 2019-10-05: 5 mg via INTRAVENOUS
  Filled 2019-10-05: qty 5

## 2019-10-05 MED ORDER — MUPIROCIN 2 % EX OINT
1.0000 "application " | TOPICAL_OINTMENT | Freq: Two times a day (BID) | CUTANEOUS | Status: DC
  Administered 2019-10-05 – 2019-10-06 (×4): 1 via NASAL
  Filled 2019-10-05 (×2): qty 22

## 2019-10-05 MED ORDER — OXYCODONE-ACETAMINOPHEN 5-325 MG PO TABS: 1.0000 | ORAL_TABLET | ORAL | 0 refills | Status: DC | PRN

## 2019-10-05 MED ORDER — NICOTINE 14 MG/24HR TD PT24
14.0000 mg | MEDICATED_PATCH | Freq: Every day | TRANSDERMAL | Status: DC
  Administered 2019-10-05 – 2019-10-07 (×3): 14 mg via TRANSDERMAL
  Filled 2019-10-05 (×3): qty 1

## 2019-10-05 MED ORDER — VITAMIN D3 50 MCG (2000 UT) PO CAPS: 2000.0000 [IU] | ORAL_CAPSULE | Freq: Every day | ORAL | 0 refills | Status: DC

## 2019-10-05 MED ORDER — VITAMIN D (ERGOCALCIFEROL) 1.25 MG (50000 UNIT) PO CAPS: 50000.0000 [IU] | ORAL_CAPSULE | ORAL | 0 refills | Status: DC

## 2019-10-05 MED ORDER — DILTIAZEM HCL 30 MG PO TABS
60.0000 mg | ORAL_TABLET | Freq: Three times a day (TID) | ORAL | Status: DC
  Administered 2019-10-05: 60 mg via ORAL
  Filled 2019-10-05: qty 2

## 2019-10-05 MED ORDER — SODIUM CHLORIDE 0.9 % IV BOLUS
500.0000 mL | Freq: Once | INTRAVENOUS | Status: AC
  Administered 2019-10-05: 500 mL via INTRAVENOUS

## 2019-10-05 NOTE — Progress Notes (Signed)
   10/05/19 1504  Assess: MEWS Score  BP 138/81  Pulse Rate (!) 155  ECG Heart Rate (!) 155  Resp 16  Level of Consciousness Alert  SpO2 97 %  O2 Device Room Air  Patient Activity (if Appropriate) In bed  Assess: MEWS Score  MEWS Temp 0  MEWS Systolic 0  MEWS Pulse 3  MEWS RR 0  MEWS LOC 0  MEWS Score 3  MEWS Score Color Yellow  Assess: if the MEWS score is Yellow or Red  Were vital signs taken at a resting state? Yes  Focused Assessment Documented focused assessment  Early Detection of Sepsis Score *See Row Information* Low  MEWS guidelines implemented *See Row Information* Yes  Treat  MEWS Interventions Other (Comment)  Take Vital Signs  Increase Vital Sign Frequency  Yellow: Q 2hr X 2 then Q 4hr X 2, if remains yellow, continue Q 4hrs  Escalate  MEWS: Escalate Yellow: discuss with charge nurse/RN and consider discussing with provider and RRT  Notify: Charge Nurse/RN  Name of Charge Nurse/RN Notified Lazarus Gowda  Date Charge Nurse/RN Notified 10/05/19  Notify: Provider  Provider Name/Title Lewie Loron, DO  Date Provider Notified 10/05/19  Time Provider Notified 1504  Notification Type Page  Notification Reason Other (Comment) (increase HR)   HR continues in 150's - 165.  Remains asymptomatic save for feelings of "fluttering" now and then.  5 mg IV Metoprolol given as per MD order with good effects.

## 2019-10-05 NOTE — TOC CAGE-AID Note (Signed)
Transition of Care [facility]) - CAGE-AID Screening   Patient Details  Name: [name] MRN: [id number] Date of Birth: 03-18-1961  [facility]) CM/SW Contact:    Emeterio Reeve, [place] Phone Number: 10/05/2019, 4:31 PM   Clinical Narrative:  Pt declined alcohol use and substance use.   CAGE-AID Screening:    Have You Ever Felt You Ought to Cut Down on Your Drinking or Drug Use?: No Have People Annoyed You By Critizing Your Drinking Or Drug Use?: No Have You Felt Bad Or Guilty About Your Drinking Or Drug Use?: No Have You Ever Had a Drink or Used Drugs First Thing In The Morning to STeady Your Nerves or to Get Rid of a Hangover?: No CAGE-AID Score: 0  Substance Abuse Education Offered: No     Blima Ledger, [place] Social Worker [phone]

## 2019-10-05 NOTE — Progress Notes (Signed)
[facility] Progress Note   Subjective:   Tolerated HD with UF 2.5L yesterday.  Pain improved c/w yesterday.  PT/OT rec SNF  Objective Vitals:   10/04/19 2159 10/05/19 0408 10/05/19 0538 10/05/19 0813  BP: 115/80 117/75 119/74 138/67  Pulse:  74  88  Resp:  15  14  Temp:  98.1 F (36.7 C)  98.9 F (37.2 C)  TempSrc:  Oral  Oral  SpO2:  100%  93%  Weight:      Height:       Physical Exam General: frail, thin, comfortable now Heart: RRR Lungs: normal WOB on RA at rest Abdomen: soft, scaphoid Extremities: R leg with post op dressing off, sutures c/d/i, no ankle edema Dialysis Access: LUE AVF +t/b, 1+ edema unchanged  Additional Objective Labs: Basic Metabolic Panel: Recent Labs  Lab 10/03/19 0220 10/03/19 0220 10/03/19 0654 10/04/19 0445 10/05/19 0333  NA 139   < > 138 136 136  K 6.1*   < > 5.5* 5.9* 3.8  CL 98   < > 96* 95* 95*  CO2 27  --   --  26 29  GLUCOSE 78   < > 90 128* 108*  BUN 24*   < > 26* 40* 19  CREATININE 5.28*   < > 5.30* 6.42* 3.29*  CALCIUM 8.3*  --   --  7.9* 8.2*  PHOS 7.6*  --   --   --   --    < > = values in this interval not displayed.   Liver Function Tests: Recent Labs  Lab 10/01/19 1113  AST 12*  ALT 11  ALKPHOS 65  BILITOT 0.5  PROT 5.0*  ALBUMIN 2.5*   No results for input(s): LIPASE, AMYLASE in the last 168 hours. CBC: Recent Labs  Lab 10/01/19 1113 10/01/19 1113 10/02/19 0330 10/02/19 0330 10/03/19 0654 10/04/19 0445 10/05/19 0333  WBC 9.5   < > 11.8*  --   --  8.4 8.7  NEUTROABS 7.3  --   --   --   --   --   --   HGB 12.4*   < > 11.4*   < > 9.9* 7.6* 7.5*  HCT 39.6   < > 36.8*   < > 29.0* 23.7* 24.2*  MCV 105.0*  --  105.4*  --   --  103.0* 104.8*  PLT 246   < > 280  --   --  170 178   < > = values in this interval not displayed.   Blood Culture    Component Value Date/Time   SDES URINE, CLEAN CATCH 05/21/2016 0941   SPECREQUEST NONE 05/21/2016 0941   CULT MULTIPLE SPECIES PRESENT, SUGGEST  RECOLLECTION (A) 05/21/2016 0941   REPTSTATUS 05/23/2016 FINAL 05/21/2016 0941    Cardiac Enzymes: No results for input(s): CKTOTAL, CKMB, CKMBINDEX, TROPONINI in the last 168 hours. CBG: Recent Labs  Lab 10/02/19 0540 10/04/19 0738 10/04/19 1316 10/04/19 1619 10/05/19 0221  GLUCAP 73 115* 78 115* 82   Iron Studies: No results for input(s): IRON, TIBC, TRANSFERRIN, FERRITIN in the last 72 hours. @lablastinr3 @ Studies/Results: DG Knee Right Port  Result Date: 10/03/2019 CLINICAL DATA:  Right distal femur ORIF. EXAM: PORTABLE RIGHT KNEE - 1-2 VIEW COMPARISON:  Intraoperative x-rays from same day. CT right knee dated Oct 01, 2019. FINDINGS: Interval lateral plate and screw fixation of the comminuted distal femoral metadiaphyseal fracture, in near anatomic alignment. No evidence of hardware failure or loosening. Expected postsurgical changes and  subcutaneous emphysema in the surrounding soft tissues and knee joint. IMPRESSION: Interval right distal femoral metadiaphyseal fracture ORIF in near anatomic alignment. Electronically Signed   By: Titus Dubin M.D.   On: 10/03/2019 10:26   DG C-Arm 1-60 Min  Result Date: 10/03/2019 CLINICAL DATA:  Distal femur fracture ORIF. EXAM: RIGHT FEMUR 2 VIEWS; DG C-ARM 1-60 MIN FLUOROSCOPY TIME:  57 seconds. C-arm fluoroscopic images were obtained intraoperatively and submitted for post operative interpretation. COMPARISON:  CT right knee dated Oct 01, 2019. FINDINGS: Multiple intraoperative fluoroscopic images demonstrate interval lateral plate and screw fixation of the comminuted right distal femoral metadiaphyseal fracture, in near anatomic alignment. IMPRESSION: Intraoperative fluoroscopic guidance for right distal femur fracture ORIF. Electronically Signed   By: Titus Dubin M.D.   On: 10/03/2019 09:44   DG FEMUR, MIN 2 VIEWS RIGHT  Result Date: 10/03/2019 CLINICAL DATA:  Distal femur fracture ORIF. EXAM: RIGHT FEMUR 2 VIEWS; DG C-ARM 1-60 MIN  FLUOROSCOPY TIME:  57 seconds. C-arm fluoroscopic images were obtained intraoperatively and submitted for post operative interpretation. COMPARISON:  CT right knee dated Oct 01, 2019. FINDINGS: Multiple intraoperative fluoroscopic images demonstrate interval lateral plate and screw fixation of the comminuted right distal femoral metadiaphyseal fracture, in near anatomic alignment. IMPRESSION: Intraoperative fluoroscopic guidance for right distal femur fracture ORIF. Electronically Signed   By: Titus Dubin M.D.   On: 10/03/2019 09:44   Medications: . sodium chloride    . diltiazem (CARDIZEM) infusion Stopped (10/03/19 1345)  . methocarbamol (ROBAXIN) IV     . acetaminophen  650 mg Oral Q6H  . Chlorhexidine Gluconate Cloth  6 each Topical Daily  . Chlorhexidine Gluconate Cloth  6 each Topical Q0600  . cholecalciferol  2,000 Units Oral Daily  . diltiazem  30 mg Oral Q8H  . docusate sodium  100 mg Oral BID  . heparin injection (subcutaneous)  5,000 Units Subcutaneous Q8H  . sevelamer carbonate  3.2 g Oral TID WC  . sodium chloride flush  3 mL Intravenous Q12H  . Vitamin D (Ergocalciferol)  50,000 Units Oral Q7 days    Dialysis Orders:  Assessment/Plan: 1. ESRD - TTS [facility] 1st shift.  Had HD yesterdayper schedule, plan to continue TIW while admitted, next tomorrow.  Has some LUE edema which he says is actually improved -- may need outpt angiogram; we've not had any issues using the AVF here.   2. Anemia - Hb from 11s to 7.6 > 7.5 this AM.  Expect operative loss.  No other bleeding reported.  Monitor if transfusion needed.  Give ESA with next HD - aranesp 100 on 5/27; check iron indices and supplement PRN.  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 Right distal femoral fracture - s/p OR 5/24, SNF recommended 5. Muscular dystrophy 6. Chronic atrial fibrillation - rate control  7. HFpEF  With diastolic dysfunction, compensated at  this time. 8. HTN - BP low but stable, tolerating HD ok.  9. A fib - had A fib with RVR post op - resolved with IV diltiazem and now on po.   Jannifer Hick MD 10/05/2019, 9:07 AM  [facility] Pager: [phone]

## 2019-10-05 NOTE — Progress Notes (Signed)
PROGRESS NOTE    Steven Sellers  EPP:[id number] DOB: 17-Jan-1961 DOA: 10/01/2019 PCP: Sharilyn Sites, MD    Brief Narrative:  Steven Sellers is an 59 y.o. male past medical history that includes limb-girdle muscular dystrophy, end-stage renal disease on dialysis Tuesday Thursday Saturday, PE in 2012, chronic A. fib not on anticoagulation, hypertension admitted May 22 with right femur fracture after mechanical fall at the dialysis center.  Assessment & Plan:   Principal Problem:   Closed displaced supracondylar fracture of distal end of right femur with intracondylar extension ([place]) Active Problems:   Essential hypertension   Muscle atrophy   Limb-girdle muscular dystrophy ([place])   Anemia   ESRD needing dialysis (HCC)   Chronic a-fib ----Declined Anti-coagulation due to Frequent Bruising   Atrial fibrillation with RVR ([place])   Right distal femur fracture Patient presented following mechanical fall at hemodialysis. Imaging notable for distal right femur fracture. Patient underwent ORIF on 10/03/2019 by Dr. Doreatha Martin. --Touchdown weightbearing status right lower extremity --Pain control with Tylenol, Robaxin, oxycodone prn --Will need 2-week follow-up with orthopedics for repeat x-rays and suture removal --PT/OT recommend SNF; pending placement --Repeat Covid-19 test today  A. fib with RVR Following surgical invention, patient developed A. fib with RVR in PACU, initially requiring Cardizem drip which was transitioned back to his home Cardizem. --continue Cardizem 30mg  PO q8h   ESRD on HD TTS Patient follows with outpatient nephrology and dialysis in [place]. --Continue HD per nephrology  Chronic diastolic congestive heart failure, compensated Hx of essential hypertension TTE 2017 with EF 60-63% with diastolic dysfunction. Volume managed with HD. --BP 119/74, well controlled. --Continue HD per nephrology  Limb-girdle muscular dystrophy Appears to be at baseline. --Pending SNF  placement given hip fracture as above  Tobacco use disorder --Counseled on need for cessation  Anemia of chronic disease with likely component of postoperative blood loss anemia --Hemoglobin 9.9>7.6>7.5 --continue Aranesp weekly with HD --Pending ferritin, Iron, TIBC --Monitor CBC daily --Transfuse for hemoglobin less than 7.0  Vitamin D deficiency --drisdol 50,000 units PO q7 days   DVT prophylaxis: Heparin Code Status: Full Code Family Communication: Updated patient spouse, [name] via telephone this morning  Disposition Plan:  Status is: Inpatient  Remains inpatient appropriate because:Ongoing active pain requiring inpatient pain management and Unsafe d/c plan   Dispo: The patient is from: Home              Anticipated d/c is to: SNF              Anticipated d/c date is: 1 day              Patient currently is medically stable to d/c.   Consultants:   Ortho[pedics - Dr. Doreatha Martin  Procedures:   ORIF 5/24 - Dr. Doreatha Martin  Antimicrobials:   Perioperative cefazolin     Subjective: Patient seen and examined bedside, resting comfortably.  States pain is well controlled.  Updated spouse via telephone this morning.  Awaiting SNF bed placement.  Patient denies headache, no fever/chills/night sweats, no nausea/vomiting/diarrhea, no chest pain, no palpitations, no shortness of breath, no abdominal pain.  No acute events overnight per nursing staff.  Objective: Vitals:   10/04/19 2159 10/05/19 0408 10/05/19 0538 10/05/19 0813  BP: 115/80 117/75 119/74 138/67  Pulse:  74  88  Resp:  15  14  Temp:  98.1 F (36.7 C)  98.9 F (37.2 C)  TempSrc:  Oral  Oral  SpO2:  100%  93%  Weight:  Height:        Intake/Output Summary (Last 24 hours) at 10/05/2019 0942 Last data filed at 10/04/2019 2300 Gross per 24 hour  Intake --  Output 2500 ml  Net -2500 ml   Filed Weights   10/03/19 1806 10/04/19 0805 10/04/19 1233  Weight: 65.8 kg 68.4 kg 65.7 kg     Examination:  General exam: Appears calm and comfortable  Respiratory system: Clear to auscultation. Respiratory effort normal.  Oxygenating well on room air Cardiovascular system: S1 & S2 heard, RRR. No JVD, murmurs, rubs, gallops or clicks. No pedal edema. Gastrointestinal system: Abdomen is nondistended, soft and nontender. No organomegaly or masses felt. Normal bowel sounds heard. Central nervous system: Alert and oriented. No focal neurological deficits. Extremities: Symmetric 5 x 5 power. Skin: Surgical incision site noted, clean/dry/intact Psychiatry: Judgement and insight appear normal. Mood & affect appropriate.     Data Reviewed: I have personally reviewed following labs and imaging studies  CBC: Recent Labs  Lab 10/01/19 1113 10/02/19 0330 10/03/19 0654 10/04/19 0445 10/05/19 0333  WBC 9.5 11.8*  --  8.4 8.7  NEUTROABS 7.3  --   --   --   --   HGB 12.4* 11.4* 9.9* 7.6* 7.5*  HCT 39.6 36.8* 29.0* 23.7* 24.2*  MCV 105.0* 105.4*  --  103.0* 104.8*  PLT 246 280  --  170 702   Basic Metabolic Panel: Recent Labs  Lab 10/01/19 1113 10/01/19 1113 10/02/19 0330 10/03/19 0220 10/03/19 0654 10/04/19 0445 10/05/19 0333  NA 139   < > 142 139 138 136 136  K 4.2   < > 5.4* 6.1* 5.5* 5.9* 3.8  CL 99   < > 102 98 96* 95* 95*  CO2 30  --  29 27  --  26 29  GLUCOSE 62*   < > 89 78 90 128* 108*  BUN 12   < > 14 24* 26* 40* 19  CREATININE 3.08*   < > 4.06* 5.28* 5.30* 6.42* 3.29*  CALCIUM 8.1*  --  8.3* 8.3*  --  7.9* 8.2*  PHOS  --   --   --  7.6*  --   --   --    < > = values in this interval not displayed.   GFR: Estimated Creatinine Clearance: 22.5 mL/min (A) (by C-G formula based on SCr of 3.29 mg/dL (H)). Liver Function Tests: Recent Labs  Lab 10/01/19 1113  AST 12*  ALT 11  ALKPHOS 65  BILITOT 0.5  PROT 5.0*  ALBUMIN 2.5*   No results for input(s): LIPASE, AMYLASE in the last 168 hours. No results for input(s): AMMONIA in the last 168  hours. Coagulation Profile: Recent Labs  Lab 10/01/19 1113  INR 0.9   Cardiac Enzymes: No results for input(s): CKTOTAL, CKMB, CKMBINDEX, TROPONINI in the last 168 hours. BNP (last 3 results) No results for input(s): PROBNP in the last 8760 hours. HbA1C: No results for input(s): HGBA1C in the last 72 hours. CBG: Recent Labs  Lab 10/04/19 0738 10/04/19 1316 10/04/19 1619 10/05/19 0221 10/05/19 0932  GLUCAP 115* 78 115* 82 82   Lipid Profile: No results for input(s): CHOL, HDL, LDLCALC, TRIG, CHOLHDL, LDLDIRECT in the last 72 hours. Thyroid Function Tests: No results for input(s): TSH, T4TOTAL, FREET4, T3FREE, THYROIDAB in the last 72 hours. Anemia Panel: No results for input(s): VITAMINB12, FOLATE, FERRITIN, TIBC, IRON, RETICCTPCT in the last 72 hours. Sepsis Labs: No results for input(s): PROCALCITON, LATICACIDVEN in the last 168  hours.  Recent Results (from the past 240 hour(s))  SARS Coronavirus 2 by RT PCR (hospital order, performed in [facility] hospital lab) Nasopharyngeal Nasopharyngeal Swab     Status: None   Collection Time: 10/01/19 12:15 PM   Specimen: Nasopharyngeal Swab  Result Value Ref Range Status   SARS Coronavirus 2 NEGATIVE NEGATIVE Final    Comment: (NOTE) SARS-CoV-2 target nucleic acids are NOT DETECTED. The SARS-CoV-2 RNA is generally detectable in upper and lower respiratory specimens during the acute phase of infection. The lowest concentration of SARS-CoV-2 viral copies this assay can detect is 250 copies / mL. A negative result does not preclude SARS-CoV-2 infection and should not be used as the sole basis for treatment or other patient management decisions.  A negative result may occur with improper specimen collection / handling, submission of specimen other than nasopharyngeal swab, presence of viral mutation(s) within the areas targeted by this assay, and inadequate number of viral copies (<250 copies / mL). A negative result must be  combined with clinical observations, patient history, and epidemiological information. Fact Sheet for Patients:   StrictlyIdeas.no Fact Sheet for Healthcare Providers: BankingDealers.co.za This test is not yet approved or cleared  by the [place] FDA and has been authorized for detection and/or diagnosis of SARS-CoV-2 by FDA under an Emergency Use Authorization (EUA).  This EUA will remain in effect (meaning this test can be used) for the duration of the COVID-19 declaration under Section 564(b)(1) of the Act, 21 U.S.C. section 360bbb-3(b)(1), unless the authorization is terminated or revoked sooner. Performed at [facility], [address] [place], [place] [address]   Surgical pcr screen     Status: Abnormal   Collection Time: 10/02/19  6:58 AM   Specimen: Nasal Mucosa; Nasal Swab  Result Value Ref Range Status   MRSA, PCR NEGATIVE NEGATIVE Final   Staphylococcus aureus POSITIVE (A) NEGATIVE Final    Comment: RESULT CALLED TO, READ BACK BY AND VERIFIED WITH: RN Jacob Moores KING 10/02/19 0920 KB (NOTE) The Xpert SA Assay (FDA approved for NASAL specimens in patients 39 years of age and older), is one component of a comprehensive surveillance program. It is not intended to diagnose infection nor to guide or monitor treatment.          Radiology Studies: DG Knee Right Port  Result Date: 10/03/2019 CLINICAL DATA:  Right distal femur ORIF. EXAM: PORTABLE RIGHT KNEE - 1-2 VIEW COMPARISON:  Intraoperative x-rays from same day. CT right knee dated Oct 01, 2019. FINDINGS: Interval lateral plate and screw fixation of the comminuted distal femoral metadiaphyseal fracture, in near anatomic alignment. No evidence of hardware failure or loosening. Expected postsurgical changes and subcutaneous emphysema in the surrounding soft tissues and knee joint. IMPRESSION: Interval right distal femoral metadiaphyseal fracture ORIF in near anatomic  alignment. Electronically Signed   By: Titus Dubin M.D.   On: 10/03/2019 10:26        Scheduled Meds: . acetaminophen  650 mg Oral Q6H  . Chlorhexidine Gluconate Cloth  6 each Topical Daily  . Chlorhexidine Gluconate Cloth  6 each Topical Q0600  . cholecalciferol  2,000 Units Oral Daily  . darbepoetin (ARANESP) injection - DIALYSIS  100 mcg Intravenous Q Wed-HD  . diltiazem  30 mg Oral Q8H  . docusate sodium  100 mg Oral BID  . heparin injection (subcutaneous)  5,000 Units Subcutaneous Q8H  . mupirocin ointment  1 application Nasal BID  . sevelamer carbonate  3.2 g Oral TID WC  .  sodium chloride flush  3 mL Intravenous Q12H  . Vitamin D (Ergocalciferol)  50,000 Units Oral Q7 days   Continuous Infusions: . sodium chloride    . diltiazem (CARDIZEM) infusion Stopped (10/03/19 1345)  . methocarbamol (ROBAXIN) IV       LOS: 4 days    Time spent: 35 minutes spent on chart review, discussion with nursing staff, consultants, updating family and interview/physical exam; more than 50% of that time was spent in counseling and/or coordination of care.    Abigale Dorow J [place] DO [facility] Available via Epic secure chat 7am-7pm After these hours, please refer to coverage provider listed on [email or web address] 10/05/2019, 9:42 AM

## 2019-10-05 NOTE — Progress Notes (Signed)
   10/05/19 1646  Assess: MEWS Score  Temp 98.5 F (36.9 C)  BP 91/82  Pulse Rate (!) 155  ECG Heart Rate (!) 155  Resp 16  Level of Consciousness Alert  SpO2 98 %  O2 Device Room Air  Patient Activity (if Appropriate) In bed  Assess: MEWS Score  MEWS Temp 0  MEWS Systolic 1  MEWS Pulse 3  MEWS RR 0  MEWS LOC 0  MEWS Score 4  MEWS Score Color Red  Assess: if the MEWS score is Yellow or Red  Were vital signs taken at a resting state? Yes  Focused Assessment Documented focused assessment  Early Detection of Sepsis Score *See Row Information* Low  MEWS guidelines implemented *See Row Information* Yes  Treat  MEWS Interventions Other (Comment) (Notified MD)  Take Vital Signs  Increase Vital Sign Frequency  Red: Q 1hr X 4 then Q 4hr X 4, if remains red, continue Q 4hrs  Escalate  MEWS: Escalate Red: discuss with charge nurse/RN and provider, consider discussing with RRT  Notify: Charge Nurse/RN  Name of Charge Nurse/RN Notified Lazarus Gowda  Date Charge Nurse/RN Notified 10/05/19  Time Charge Nurse/RN Notified 1655  Notify: Provider  Provider Name/Title Wynelle Fanny, DO  Date Provider Notified 10/05/19  Time Provider Notified 1646  Notification Type Page  Notification Reason Other (Comment)  Response See new orders  Date of Provider Response 10/05/19  Time of Provider Response 1646  Notify: Rapid Response  Name of Rapid Response RN Notified Pujah, RN  Date Rapid Response Notified 10/05/19  Time Rapid Response Notified 7867    Treatment administered for A-fib s/ RVR

## 2019-10-05 NOTE — Progress Notes (Signed)
   10/05/19 1311  Assess: MEWS Score  Temp 98.6 F (37 C)  BP 123/68  Pulse Rate (!) 119  ECG Heart Rate (!) 119  Resp 16  Level of Consciousness Alert  SpO2 94 %  O2 Device Room Air  Assess: MEWS Score  MEWS Temp 0  MEWS Systolic 0  MEWS Pulse 2  MEWS RR 0  MEWS LOC 0  MEWS Score 2  MEWS Score Color Yellow  Assess: if the MEWS score is Yellow or Red  Were vital signs taken at a resting state? Yes  Focused Assessment Documented focused assessment  Early Detection of Sepsis Score *See Row Information* Low  MEWS guidelines implemented *See Row Information* Yes  Treat  MEWS Interventions Other (Comment) (Notified MD)  Take Vital Signs  Increase Vital Sign Frequency  Yellow: Q 2hr X 2 then Q 4hr X 2, if remains yellow, continue Q 4hrs  Escalate  MEWS: Escalate Yellow: discuss with charge nurse/RN and consider discussing with provider and RRT  Notify: Charge Nurse/RN  Name of Charge Nurse/RN Notified Lazarus Gowda, RN  Date Charge Nurse/RN Notified 10/05/19  Time Charge Nurse/RN Notified 7106  Notify: Provider  Provider Name/Title Eric [place] DO  Date Provider Notified 10/05/19  Time Provider Notified 1320  Notification Type Page  Notification Reason Other (Comment) (Change in HR)  Response See new orders  Date of Provider Response 10/05/19  Time of Provider Response 1315   Pt has had steadily increasing HR (up to 150's) without symptoms.  Discussed with MD.  Fluid bolus started and Cardizem po to be increased.  Will continue to monitor.

## 2019-10-05 NOTE — Progress Notes (Signed)
Orthopaedic Trauma Progress Note  S: Doing fairly well this morning, pain improving. Moving leg better than yesterday. Tolerated sitting on EOB with therapies yesterday. Feels like ace wrap is bothering back of his thigh. No other concerns currently  O:  Vitals:   10/05/19 0408 10/05/19 0538  BP: 117/75 119/74  Pulse: 74   Resp: 15   Temp: 98.1 F (36.7 C)   SpO2: 100%     General: Sitting up in bed, no acute distress Respiratory: No increased work of breathing.  Right lower extremity: Dressing removed, incisions are clean, dry, intact.  Mildly tender with palpation of the distal femur/knee.  Able to flex knee small amount. Ankle dorsiflexion/plantarflexion is intact.  Able to wiggle each of his toes.  Sensation intact to light touch distally.+ DP pulse  Imaging: Stable post op imaging.   Labs:  Results for orders placed or performed during the hospital encounter of 10/01/19 (from the past 24 hour(s))  Hepatitis B surface antigen     Status: None   Collection Time: 10/04/19  8:22 AM  Result Value Ref Range   Hepatitis B Surface Ag NON REACTIVE NON REACTIVE  Glucose, capillary     Status: None   Collection Time: 10/04/19  1:16 PM  Result Value Ref Range   Glucose-Capillary 78 70 - 99 mg/dL  Glucose, capillary     Status: Abnormal   Collection Time: 10/04/19  4:19 PM  Result Value Ref Range   Glucose-Capillary 115 (H) 70 - 99 mg/dL  Glucose, capillary     Status: None   Collection Time: 10/05/19  2:21 AM  Result Value Ref Range   Glucose-Capillary 82 70 - 99 mg/dL  CBC     Status: Abnormal   Collection Time: 10/05/19  3:33 AM  Result Value Ref Range   WBC 8.7 4.0 - 10.5 K/uL   RBC 2.31 (L) 4.22 - 5.81 MIL/uL   Hemoglobin 7.5 (L) 13.0 - 17.0 g/dL   HCT 24.2 (L) 39.0 - 52.0 %   MCV 104.8 (H) 80.0 - 100.0 fL   MCH 32.5 26.0 - 34.0 pg   MCHC 31.0 30.0 - 36.0 g/dL   RDW 13.8 11.5 - 15.5 %   Platelets 178 150 - 400 K/uL   nRBC 0.0 0.0 - 0.2 %  Basic metabolic panel      Status: Abnormal   Collection Time: 10/05/19  3:33 AM  Result Value Ref Range   Sodium 136 135 - 145 mmol/L   Potassium 3.8 3.5 - 5.1 mmol/L   Chloride 95 (L) 98 - 111 mmol/L   CO2 29 22 - 32 mmol/L   Glucose, Bld 108 (H) 70 - 99 mg/dL   BUN 19 6 - 20 mg/dL   Creatinine, Ser 3.29 (H) 0.61 - 1.24 mg/dL   Calcium 8.2 (L) 8.9 - 10.3 mg/dL   GFR calc non Af Amer 19 (L) >60 mL/min   GFR calc Af Amer 23 (L) >60 mL/min   Anion gap 12 5 - 15    Assessment: 59 year old male s/p fall when leaving hemodialysis, 2 Days Post-Op   Injuries: Right distal femur fracture with intra-articular split s/p ORIF  Weightbearing: TDWB RLE  Insicional and dressing care: Okay to leave incisions open to air  Showering: Okay to begin showering on 10/06/2019 if incisions are C/D/I  Orthopedic device(s): None   CV/Blood loss: Acute blood loss anemia, Hgb 7.5 this morning. Hemodynamically stable currently.  Continue to monitor CBC  Pain management:  1.  Tylenol 650 mg q 6 hours scheduled 2. Robaxin 500 mg q 6 hours PRN 3. Oxycodone 5-10 mg q 4 hours PRN 4. Dilaudid 0.5-1 mg q 4 hours PRN  VTE prophylaxis: Heparin, SCDs  ID:  Ancef 2gm post op completed  Foley/Lines:  No foley, KVO IVFs  Medical co-morbidities: ESRD, hypertension, limb-girdle muscular dystrophy, chronic A. Fib,hx of PE in 2012  Impediments to Fracture Healing: Vitamin D level 9, start D2 and D3 supplementation   Dispo: Up with therapies as tolerated. PT/OT recommending SNF. Continue to monitor CBC.   Follow - up plan: 2 weeks after hospital discharge for repeat x-rays and suture removal  Contact information:  Katha Hamming MD, Patrecia Pace PA-C   [facility] [place] [facility] [phone] (office) [email or web address]

## 2019-10-05 NOTE — Plan of Care (Addendum)
Sore forming on back of right thigh, pt states d/t ace bandage. Place gauze as a barrier.Will continue to monitor.   Problem: Education: Goal: Knowledge of General Education information will improve Description: Including pain rating scale, medication(s)/side effects and non-pharmacologic comfort measures Outcome: Progressing   Problem: Activity: Goal: Risk for activity intolerance will decrease Outcome: Progressing   Problem: Pain Managment: Goal: General experience of comfort will improve Outcome: Progressing   Problem: Safety: Goal: Ability to remain free from injury will improve Outcome: Progressing   Problem: Skin Integrity: Goal: Risk for impaired skin integrity will decrease Outcome: Progressing

## 2019-10-05 NOTE — Progress Notes (Signed)
SLP Cancellation Note  Patient Details Name: Steven Sellers MRN: [id number] DOB: [birth year]   Cancelled treatment:       Reason Eval/Treat Not Completed: Medical issues which prohibited therapy. Attempted to f/u for dysphagia treatment. RN says not to see pt at this time given his elevated HR (160s). Will f/u as able.     Osie Bond., M.A. [facility] Pager [phone] Office [phone]  10/05/2019, 2:57 PM

## 2019-10-06 LAB — BASIC METABOLIC PANEL
Anion gap: 12 (ref 5–15)
BUN: 33 mg/dL — ABNORMAL HIGH (ref 6–20)
CO2: 26 mmol/L (ref 22–32)
Calcium: 7.7 mg/dL — ABNORMAL LOW (ref 8.9–10.3)
Chloride: 97 mmol/L — ABNORMAL LOW (ref 98–111)
Creatinine, Ser: 4.61 mg/dL — ABNORMAL HIGH (ref 0.61–1.24)
GFR calc Af Amer: 15 mL/min — ABNORMAL LOW (ref >60)
GFR calc non Af Amer: 13 mL/min — ABNORMAL LOW (ref >60)
Glucose, Bld: 90 mg/dL (ref 70–99)
Potassium: 4.5 mmol/L (ref 3.5–5.1)
Sodium: 135 mmol/L (ref 135–145)

## 2019-10-06 LAB — CBC
HCT: 23.8 % — ABNORMAL LOW (ref 39.0–52.0)
Hemoglobin: 7.6 g/dL — ABNORMAL LOW (ref 13.0–17.0)
MCH: 32.9 pg (ref 26.0–34.0)
MCHC: 31.9 g/dL (ref 30.0–36.0)
MCV: 103 fL — ABNORMAL HIGH (ref 80.0–100.0)
Platelets: 212 10*3/uL (ref 150–400)
RBC: 2.31 MIL/uL — ABNORMAL LOW (ref 4.22–5.81)
RDW: 13.9 % (ref 11.5–15.5)
WBC: 6.8 10*3/uL (ref 4.0–10.5)
nRBC: 0 % (ref 0.0–0.2)

## 2019-10-06 LAB — FERRITIN: Ferritin: 428 ng/mL — ABNORMAL HIGH (ref 24–336)

## 2019-10-06 LAB — GLUCOSE, CAPILLARY
Glucose-Capillary: 74 mg/dL (ref 70–99)
Glucose-Capillary: 109 mg/dL — ABNORMAL HIGH (ref 70–99)
Glucose-Capillary: 98 mg/dL (ref 70–99)

## 2019-10-06 LAB — IRON AND TIBC
Iron: 82 ug/dL (ref 45–182)
Saturation Ratios: 72 % — ABNORMAL HIGH (ref 17.9–39.5)
TIBC: 113 ug/dL — ABNORMAL LOW (ref 250–450)
UIBC: 31 ug/dL

## 2019-10-06 LAB — MAGNESIUM: Magnesium: 1.9 mg/dL (ref 1.7–2.4)

## 2019-10-06 MED ORDER — SODIUM CHLORIDE 0.9 % IV SOLN: 100.0000 mL | INTRAVENOUS | Status: DC | PRN

## 2019-10-06 MED ORDER — LIDOCAINE HCL (PF) 1 % IJ SOLN: 5.0000 mL | INTRAMUSCULAR | Status: DC | PRN

## 2019-10-06 MED ORDER — PENTAFLUOROPROP-TETRAFLUOROETH EX AERO: 1.0000 "application " | INHALATION_SPRAY | CUTANEOUS | Status: DC | PRN

## 2019-10-06 MED ORDER — DILTIAZEM HCL ER COATED BEADS 240 MG PO CP24
240.0000 mg | ORAL_CAPSULE | Freq: Every day | ORAL | Status: DC
  Administered 2019-10-07: 240 mg via ORAL
  Filled 2019-10-06 (×2): qty 1

## 2019-10-06 MED ORDER — LIDOCAINE-PRILOCAINE 2.5-2.5 % EX CREA
1.0000 "application " | TOPICAL_CREAM | CUTANEOUS | Status: DC | PRN
  Filled 2019-10-06: qty 5

## 2019-10-06 MED ORDER — HEPARIN SODIUM (PORCINE) 1000 UNIT/ML DIALYSIS
1000.0000 [IU] | INTRAMUSCULAR | Status: DC | PRN
  Filled 2019-10-06: qty 1

## 2019-10-06 MED ORDER — ALTEPLASE 2 MG IJ SOLR: 2.0000 mg | Freq: Once | INTRAMUSCULAR | Status: DC | PRN

## 2019-10-06 NOTE — Progress Notes (Signed)
PT Cancellation Note  Patient Details Name: Steven Sellers MRN: [id number] DOB: 10-Nov-1960   Cancelled Treatment:    Reason Eval/Treat Not Completed: (P) Patient at procedure or test/unavailable Pt off floor in HD. PT will follow back tomorrow.   [name] B. Migdalia Dk PT, DPT Acute Rehabilitation Services Pager [phone] Office [phone]    [place] 10/06/2019, 3:10 PM

## 2019-10-06 NOTE — Progress Notes (Signed)
PROGRESS NOTE    Steven Sellers  EGB:[id number] DOB: Apr 01, 1961 DOA: 10/01/2019 PCP: Sharilyn Sites, MD    Brief Narrative:  Steven Sellers is an 59 y.o. male past medical history that includes limb-girdle muscular dystrophy, end-stage renal disease on dialysis Tuesday Thursday Saturday, PE in 2012, chronic A. fib not on anticoagulation, hypertension admitted May 22 with right femur fracture after mechanical fall at the dialysis center.  Assessment & Plan:   Principal Problem:   Closed displaced supracondylar fracture of distal end of right femur with intracondylar extension ([place]) Active Problems:   Essential hypertension   Muscle atrophy   Limb-girdle muscular dystrophy ([place])   Anemia   ESRD needing dialysis (HCC)   Chronic a-fib ----Declined Anti-coagulation due to Frequent Bruising   Atrial fibrillation with RVR ([place])   Right distal femur fracture Patient presented following mechanical fall at hemodialysis. Imaging notable for distal right femur fracture. Patient underwent ORIF on 10/03/2019 by Dr. Doreatha Martin. --Touchdown weightbearing status right lower extremity --Pain control with Tylenol, Robaxin, oxycodone prn --Will need 2-week follow-up with orthopedics for repeat x-rays and suture removal --PT/OT recommend SNF; pending placement  A. fib with RVR Following surgical invention, patient developed A. fib with RVR in PACU, initially requiring Cardizem drip which was transitioned back to oral Cardizem. --continue Cardizem 60mg  PO q6h; transition to CD 240mg  PO daily tomorrow --Continue to monitor on telemetry  ESRD on HD TTS Patient follows with outpatient nephrology and dialysis in [place]. --Continue HD per nephrology; receiving today  Chronic diastolic congestive heart failure, compensated Hx of essential hypertension TTE 2017 with EF 37-10% with diastolic dysfunction. Volume managed with HD. --BP 134/75, well controlled. --Continue HD per nephrology  Limb-girdle  muscular dystrophy Appears to be at baseline. --Pending SNF placement given hip fracture as above  Tobacco use disorder --Counseled on need for cessation  Anemia of chronic disease with likely component of postoperative blood loss anemia --Hemoglobin 9.9>7.6>7.5>7.6 --continue Aranesp weekly with HD --Pending ferritin, Iron, TIBC --Monitor CBC daily --Transfuse for hemoglobin less than 7.0  Vitamin D deficiency --drisdol 50,000 units PO q7 days   DVT prophylaxis: Heparin Code Status: Full Code Family Communication: Updated patient spouse, [name] via telephone this yesterday morning  Disposition Plan:  Status is: Inpatient  Remains inpatient appropriate because:Ongoing active pain requiring inpatient pain management and Unsafe d/c plan   Dispo: The patient is from: Home              Anticipated d/c is to: SNF              Anticipated d/c date is: 1 day              Patient currently is medically stable to d/c.   Consultants:   Ortho[pedics - Dr. Doreatha Martin  Procedures:   ORIF 5/24 - Dr. Doreatha Martin  Antimicrobials:   Perioperative cefazolin     Subjective: Patient seen and examined bedside, resting comfortably.  States pain is well controlled.  Awaiting SNF bed placement. Heart rate better controlled today. Patient denies headache, no fever/chills/night sweats, no nausea/vomiting/diarrhea, no chest pain, no palpitations, no shortness of breath, no abdominal pain.  No acute events overnight per nursing staff.  Objective: Vitals:   10/06/19 1238 10/06/19 1250 10/06/19 1330 10/06/19 1400  BP: 113/72 125/67 118/69 135/79  Pulse:      Resp: 15 16 15 15   Temp:      TempSrc:      SpO2: (P) 100%     Weight: (  P) 70.3 kg     Height:        Intake/Output Summary (Last 24 hours) at 10/06/2019 1456 Last data filed at 10/06/2019 0930 Gross per 24 hour  Intake 240 ml  Output --  Net 240 ml   Filed Weights   10/04/19 1233 10/06/19 0428 10/06/19 1238  Weight: 65.7 kg 68.5  kg (P) 70.3 kg    Examination:  General exam: Appears calm and comfortable  Respiratory system: Clear to auscultation. Respiratory effort normal.  Oxygenating well on room air Cardiovascular system: S1 & S2 heard, RRR. No JVD, murmurs, rubs, gallops or clicks. No pedal edema. Gastrointestinal system: Abdomen is nondistended, soft and nontender. No organomegaly or masses felt. Normal bowel sounds heard. Central nervous system: Alert and oriented. No focal neurological deficits. Extremities: Symmetric 5 x 5 power. Skin: Surgical incision site noted, clean/dry/intact Psychiatry: Judgement and insight appear normal. Mood & affect appropriate.     Data Reviewed: I have personally reviewed following labs and imaging studies  CBC: Recent Labs  Lab 10/01/19 1113 10/01/19 1113 10/02/19 0330 10/03/19 0654 10/04/19 0445 10/05/19 0333 10/06/19 0509  WBC 9.5  --  11.8*  --  8.4 8.7 6.8  NEUTROABS 7.3  --   --   --   --   --   --   HGB 12.4*   < > 11.4* 9.9* 7.6* 7.5* 7.6*  HCT 39.6   < > 36.8* 29.0* 23.7* 24.2* 23.8*  MCV 105.0*  --  105.4*  --  103.0* 104.8* 103.0*  PLT 246  --  280  --  170 178 212   < > = values in this interval not displayed.   Basic Metabolic Panel: Recent Labs  Lab 10/02/19 0330 10/02/19 0330 10/03/19 0220 10/03/19 0654 10/04/19 0445 10/05/19 0333 10/06/19 0509  NA 142   < > 139 138 136 136 135  K 5.4*   < > 6.1* 5.5* 5.9* 3.8 4.5  CL 102   < > 98 96* 95* 95* 97*  CO2 29  --  27  --  26 29 26   GLUCOSE 89   < > 78 90 128* 108* 90  BUN 14   < > 24* 26* 40* 19 33*  CREATININE 4.06*   < > 5.28* 5.30* 6.42* 3.29* 4.61*  CALCIUM 8.3*  --  8.3*  --  7.9* 8.2* 7.7*  MG  --   --   --   --   --   --  1.9  PHOS  --   --  7.6*  --   --   --   --    < > = values in this interval not displayed.   GFR: Estimated Creatinine Clearance: 16.7 mL/min (A) (by C-G formula based on SCr of 4.61 mg/dL (H)). Liver Function Tests: Recent Labs  Lab 10/01/19 1113  AST 12*   ALT 11  ALKPHOS 65  BILITOT 0.5  PROT 5.0*  ALBUMIN 2.5*   No results for input(s): LIPASE, AMYLASE in the last 168 hours. No results for input(s): AMMONIA in the last 168 hours. Coagulation Profile: Recent Labs  Lab 10/01/19 1113  INR 0.9   Cardiac Enzymes: No results for input(s): CKTOTAL, CKMB, CKMBINDEX, TROPONINI in the last 168 hours. BNP (last 3 results) No results for input(s): PROBNP in the last 8760 hours. HbA1C: No results for input(s): HGBA1C in the last 72 hours. CBG: Recent Labs  Lab 10/05/19 0221 10/05/19 0932 10/05/19 2114 10/06/19 0749 10/06/19 1142  GLUCAP 82 82 98 74 109*   Lipid Profile: No results for input(s): CHOL, HDL, LDLCALC, TRIG, CHOLHDL, LDLDIRECT in the last 72 hours. Thyroid Function Tests: No results for input(s): TSH, T4TOTAL, FREET4, T3FREE, THYROIDAB in the last 72 hours. Anemia Panel: Recent Labs    10/06/19 0509  FERRITIN 428*  TIBC 113*  IRON 82   Sepsis Labs: No results for input(s): PROCALCITON, LATICACIDVEN in the last 168 hours.  Recent Results (from the past 240 hour(s))  SARS Coronavirus 2 by RT PCR (hospital order, performed in [facility] hospital lab) Nasopharyngeal Nasopharyngeal Swab     Status: None   Collection Time: 10/01/19 12:15 PM   Specimen: Nasopharyngeal Swab  Result Value Ref Range Status   SARS Coronavirus 2 NEGATIVE NEGATIVE Final    Comment: (NOTE) SARS-CoV-2 target nucleic acids are NOT DETECTED. The SARS-CoV-2 RNA is generally detectable in upper and lower respiratory specimens during the acute phase of infection. The lowest concentration of SARS-CoV-2 viral copies this assay can detect is 250 copies / mL. A negative result does not preclude SARS-CoV-2 infection and should not be used as the sole basis for treatment or other patient management decisions.  A negative result may occur with improper specimen collection / handling, submission of specimen other than nasopharyngeal swab, presence  of viral mutation(s) within the areas targeted by this assay, and inadequate number of viral copies (<250 copies / mL). A negative result must be combined with clinical observations, patient history, and epidemiological information. Fact Sheet for Patients:   StrictlyIdeas.no Fact Sheet for Healthcare Providers: BankingDealers.co.za This test is not yet approved or cleared  by the [place] FDA and has been authorized for detection and/or diagnosis of SARS-CoV-2 by FDA under an Emergency Use Authorization (EUA).  This EUA will remain in effect (meaning this test can be used) for the duration of the COVID-19 declaration under Section 564(b)(1) of the Act, 21 U.S.C. section 360bbb-3(b)(1), unless the authorization is terminated or revoked sooner. Performed at [facility], [address], [place], [place] [address]   Surgical pcr screen     Status: Abnormal   Collection Time: 10/02/19  6:58 AM   Specimen: Nasal Mucosa; Nasal Swab  Result Value Ref Range Status   MRSA, PCR NEGATIVE NEGATIVE Final   Staphylococcus aureus POSITIVE (A) NEGATIVE Final    Comment: RESULT CALLED TO, READ BACK BY AND VERIFIED WITH: RN Jacob Moores KING 10/02/19 0920 KB (NOTE) The Xpert SA Assay (FDA approved for NASAL specimens in patients 2 years of age and older), is one component of a comprehensive surveillance program. It is not intended to diagnose infection nor to guide or monitor treatment.   SARS Coronavirus 2 by RT PCR (hospital order, performed in [facility] hospital lab) Nasopharyngeal Nasopharyngeal Swab     Status: None   Collection Time: 10/05/19  9:40 AM   Specimen: Nasopharyngeal Swab  Result Value Ref Range Status   SARS Coronavirus 2 NEGATIVE NEGATIVE Final    Comment: (NOTE) SARS-CoV-2 target nucleic acids are NOT DETECTED. The SARS-CoV-2 RNA is generally detectable in upper and lower respiratory specimens during the acute phase of infection.  The lowest concentration of SARS-CoV-2 viral copies this assay can detect is 250 copies / mL. A negative result does not preclude SARS-CoV-2 infection and should not be used as the sole basis for treatment or other patient management decisions.  A negative result may occur with improper specimen collection / handling, submission of specimen other than nasopharyngeal swab, presence  of viral mutation(s) within the areas targeted by this assay, and inadequate number of viral copies (<250 copies / mL). A negative result must be combined with clinical observations, patient history, and epidemiological information. Fact Sheet for Patients:   StrictlyIdeas.no Fact Sheet for Healthcare Providers: BankingDealers.co.za This test is not yet approved or cleared  by the [place] FDA and has been authorized for detection and/or diagnosis of SARS-CoV-2 by FDA under an Emergency Use Authorization (EUA).  This EUA will remain in effect (meaning this test can be used) for the duration of the COVID-19 declaration under Section 564(b)(1) of the Act, 21 U.S.C. section 360bbb-3(b)(1), unless the authorization is terminated or revoked sooner. Performed at [facility], [place] [address], [place], [place] [address]          Radiology Studies: No results found.      Scheduled Meds: . acetaminophen  650 mg Oral Q6H  . Chlorhexidine Gluconate Cloth  6 each Topical Daily  . Chlorhexidine Gluconate Cloth  6 each Topical Q0600  . cholecalciferol  2,000 Units Oral Daily  . darbepoetin (ARANESP) injection - DIALYSIS  100 mcg Intravenous Q Wed-HD  . diltiazem  60 mg Oral Q6H  . docusate sodium  100 mg Oral BID  . heparin injection (subcutaneous)  5,000 Units Subcutaneous Q8H  . mupirocin ointment  1 application Nasal BID  . nicotine  14 mg Transdermal Daily  . sevelamer carbonate  3.2 g Oral TID WC  . sodium chloride flush  3 mL Intravenous Q12H    . Vitamin D (Ergocalciferol)  50,000 Units Oral Q7 days   Continuous Infusions: . sodium chloride    . sodium chloride    . sodium chloride    . methocarbamol (ROBAXIN) IV    . sodium chloride       LOS: 5 days    Time spent: 35 minutes spent on chart review, discussion with nursing staff, consultants, updating family and interview/physical exam; more than 50% of that time was spent in counseling and/or coordination of care.    Steven Ron J [place] DO [facility] Available via Epic secure chat 7am-7pm After these hours, please refer to coverage provider listed on [email or web address] 10/06/2019, 2:56 PM

## 2019-10-06 NOTE — Progress Notes (Signed)
  Speech Language Pathology Treatment: Dysphagia  Patient Details Name: Steven Sellers MRN: [id number] DOB: [birth year] Today's Date: 10/06/2019 Time: [id number] SLP Time Calculation (min) (ACUTE ONLY): 12 min  Assessment / Plan / Recommendation Clinical Impression  Pt was seen for dysphagia treatment and was cooperative throughout the session. Pt and nursing reported that the pt has been tolerating the current diet without overt s/sx of aspiration. Pt tolerated regular texture solids and thin liquids via straw without symptoms of oropharyngeal dysphagia. Mastication time was mildly increased, but [facility] considering his reduced dentition. It is recommended that his diet be upgraded to his baseline of regular texture solids and thin liquids. Further skilled SLP services are not clinically indicated at this time.    HPI HPI: Steven Sellers is an 60 y.o. male being seen at the request of Dr. Stann Mainland for right distal femur fracture.  Patient fell while leaving dialysis.  Had immediate pain and deformity to the right knee and inability to ambulate.  Was evaluated in [facility] emergency department where imaging revealed a right distal femur fracture.  Orthopedics was consulted.  Patient transferred to [facility] for surgical fixation.      SLP Plan  All goals met;Discharge SLP treatment due to (comment)       Recommendations  Diet recommendations: Regular;Thin liquid Liquids provided via: Cup;Straw Medication Administration: Whole meds with liquid Supervision: Patient able to self feed Compensations: Slow rate;Small sips/bites                Oral Care Recommendations: Oral care BID Follow up Recommendations: None SLP Visit Diagnosis: Dysphagia, unspecified (R13.10) Plan: All goals met;Discharge SLP treatment due to (comment)       Dorthula Bier I. Hardin Negus, [place], [place] Office number [phone] Pager [place] 10/06/2019, 11:01 AM

## 2019-10-06 NOTE — Progress Notes (Signed)
[facility] Progress Note   Subjective:  Feeling well, pain better controlled.  On for HD later today.  No new issues.   Objective Vitals:   10/05/19 2019 10/05/19 2225 10/06/19 0428 10/06/19 0746  BP: 114/81 116/75 134/75 (!) 150/73  Pulse: 76 72 73 97  Resp: 17 11 17 19   Temp: 98.3 F (36.8 C) 98.2 F (36.8 C) 97.6 F (36.4 C) 98.7 F (37.1 C)  TempSrc: Oral Oral Axillary Oral  SpO2: 98% 97% 95% 100%  Weight:   68.5 kg   Height:       Physical Exam General: frail, thin, comfortable now Heart: RRR Lungs: normal WOB on RA at rest Abdomen: soft, scaphoid Extremities: R leg with post op dressing off, sutures c/d/i, no ankle edema Dialysis Access: LUE AVF +t/b, 1+ LUE edema unchanged  Additional Objective Labs: Basic Metabolic Panel: Recent Labs  Lab 10/03/19 0220 10/03/19 0654 10/04/19 0445 10/05/19 0333 10/06/19 0509  NA 139   < > 136 136 135  K 6.1*   < > 5.9* 3.8 4.5  CL 98   < > 95* 95* 97*  CO2 27  --  26 29 26   GLUCOSE 78   < > 128* 108* 90  BUN 24*   < > 40* 19 33*  CREATININE 5.28*   < > 6.42* 3.29* 4.61*  CALCIUM 8.3*  --  7.9* 8.2* 7.7*  PHOS 7.6*  --   --   --   --    < > = values in this interval not displayed.   Liver Function Tests: Recent Labs  Lab 10/01/19 1113  AST 12*  ALT 11  ALKPHOS 65  BILITOT 0.5  PROT 5.0*  ALBUMIN 2.5*   No results for input(s): LIPASE, AMYLASE in the last 168 hours. CBC: Recent Labs  Lab 10/01/19 1113 10/01/19 1113 10/02/19 0330 10/03/19 0654 10/04/19 0445 10/05/19 0333 10/06/19 0509  WBC 9.5   < > 11.8*  --  8.4 8.7 6.8  NEUTROABS 7.3  --   --   --   --   --   --   HGB 12.4*   < > 11.4*   < > 7.6* 7.5* 7.6*  HCT 39.6   < > 36.8*   < > 23.7* 24.2* 23.8*  MCV 105.0*  --  105.4*  --  103.0* 104.8* 103.0*  PLT 246   < > 280  --  170 178 212   < > = values in this interval not displayed.   Blood Culture    Component Value Date/Time   SDES URINE, CLEAN CATCH 05/21/2016 0941    SPECREQUEST NONE 05/21/2016 0941   CULT MULTIPLE SPECIES PRESENT, SUGGEST RECOLLECTION (A) 05/21/2016 0941   REPTSTATUS 05/23/2016 FINAL 05/21/2016 0941    Cardiac Enzymes: No results for input(s): CKTOTAL, CKMB, CKMBINDEX, TROPONINI in the last 168 hours. CBG: Recent Labs  Lab 10/04/19 1619 10/05/19 0221 10/05/19 0932 10/05/19 2114 10/06/19 0749  GLUCAP 115* 82 82 98 74   Iron Studies:  Recent Labs    10/06/19 0509  IRON 82  TIBC 113*  FERRITIN 428*   @[email or web address] @ Studies/Results: No results found. Medications: . sodium chloride    . methocarbamol (ROBAXIN) IV    . sodium chloride     . acetaminophen  650 mg Oral Q6H  . Chlorhexidine Gluconate Cloth  6 each Topical Daily  . Chlorhexidine Gluconate Cloth  6 each Topical Q0600  . cholecalciferol  2,000 Units Oral Daily  .  darbepoetin (ARANESP) injection - DIALYSIS  100 mcg Intravenous Q Wed-HD  . diltiazem  60 mg Oral Q6H  . docusate sodium  100 mg Oral BID  . heparin injection (subcutaneous)  5,000 Units Subcutaneous Q8H  . mupirocin ointment  1 application Nasal BID  . nicotine  14 mg Transdermal Daily  . sevelamer carbonate  3.2 g Oral TID WC  . sodium chloride flush  3 mL Intravenous Q12H  . Vitamin D (Ergocalciferol)  50,000 Units Oral Q7 days    Dialysis Orders:  Assessment/Plan: 1. ESRD - TTS [facility] 1st shift.  HD today.  Has some LUE edema which he says is actually improved -- may need outpt angiogram; we've not had any issues using the AVF here and this can be done outpt.   2. Anemia - Hb from 11s to 7.6 > 7.5 > 7.6 this AM.  Expect operative loss.  No other bleeding reported.  Monitor if transfusion needed.  Give ESA with next HD - aranesp 100 on 5/27. Iron replete this admit.  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 Right distal femoral fracture - s/p OR 5/24, SNF recommended 5. Muscular dystrophy 6. Chronic atrial fibrillation - rate  control  7. HFpEF  With diastolic dysfunction, compensated at this time. 8. HTN - BP low but stable, tolerating HD ok.  9. A fib - had A fib with RVR post op - resolved with IV diltiazem and now on po.   Jannifer Hick MD 10/06/2019, 11:04 AM  [facility] Pager: [phone]

## 2019-10-06 NOTE — NC FL2 (Signed)
[place] LEVEL OF CARE SCREENING TOOL     IDENTIFICATION  Patient Name: Steven Sellers Birthdate: [birth year] Sex: male Admission Date (Current Location): 10/01/2019  [facility] and [place] Number:  Herbalist and Address:  [facility], [place] [address], [place], [place] [address]      Provider Number: [id number]  Attending Physician Name and Address:  [place] Eric J, DO  Relative Name and Phone Number:  [name] [phone]    Current Level of Care: Hospital Recommended Level of Care: [place] Prior Approval Number:    Date Approved/Denied: 10/05/19 PASRR Number: [id number]  Discharge Plan: SNF    Current Diagnoses: Patient Active Problem List   Diagnosis Date Noted  . Atrial fibrillation with RVR ([place]) 10/03/2019  . Closed fracture of right distal femur ([place]) 10/02/2019  . Closed displaced supracondylar fracture of distal end of right femur with intracondylar extension ([place]) 10/01/2019  . Chronic a-fib ----Declined Anti-coagulation due to Frequent Bruising 10/01/2019  . H/o Pulmonary embolism -2012 10/01/2019  . Atrial fibrillation, new onset ([place]) 02/26/2017  . First time seizure ([place])   . Left arm weakness 09/25/2016  . CHF (congestive heart failure) ([place]) 12/16/2015  . CKD (chronic kidney disease) requiring chronic dialysis ([place]) 12/16/2015  . SOB (shortness of breath) 12/16/2015  . Central venous catheter in place   . Hyponatremia 11/30/2015  . Anemia 11/30/2015  . ESRD needing dialysis ([place]) 11/30/2015  . Acute renal failure (ARF) ([place]) 11/30/2015  . Hyperkalemia 05/12/2015  . 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 Muscle atrophy 04/25/2015  . Arm weakness 04/25/2015  . Leg weakness 04/25/2015  . Dysesthesia 04/25/2015  . Essential hypertension 05/21/2010  . H/o PULMONARY EMBOLISM in 2012 05/21/2010  . PLEURAL EFFUSION 05/21/2010  . RENAL FAILURE, ACUTE 05/21/2010    Orientation  RESPIRATION BLADDER Height & Weight     Self, Time, Situation, Place  Normal Continent Weight: 151 lb 0.2 oz (68.5 kg) Height:  6\' 2"  (188 cm)  BEHAVIORAL SYMPTOMS/MOOD NEUROLOGICAL BOWEL NUTRITION STATUS      Continent(Constipation) Diet(See Discharge Summary)  AMBULATORY STATUS COMMUNICATION OF NEEDS Skin   Extensive Assist Verbally Skin abrasions, Surgical wounds, Other (Comment)(Appropriate for Ethnicity, Dry, Ecchymosis skin tear surgical incision abrasion arm,hand,leg,right left anterior foam hand leg bilateral skin tear arm,leg bilateral foam non-tenting incision closed leg incision closed hand left incision closed neck right)                       Personal Care Assistance Level of Assistance  Bathing, Feeding, Dressing Bathing Assistance: Maximum assistance Feeding assistance: Independent(able to feed self) Dressing Assistance: Maximum assistance     Functional Limitations Info  Sight, Hearing, Speech Sight Info: Adequate Hearing Info: Adequate Speech Info: Adequate    SPECIAL CARE FACTORS FREQUENCY  PT (By licensed PT), OT (By licensed OT)     PT Frequency: 5x min weekly OT Frequency: 5x min weekly            Contractures Contractures Info: Not present    Additional Factors Info  Code Status Code Status Info: FULL Allergies Info: No known drug allergies           Current Medications (10/06/2019):  This is the current hospital active medication list Current Facility-Administered Medications  Medication Dose Route Frequency Provider Last Rate Last Admin  . 0.9 %  sodium chloride infusion  250 mL Intravenous PRN Delray Alt, PA-C      .  0.9 %  sodium chloride infusion  100 mL Intravenous PRN Justin Mend, MD      . 0.9 %  sodium chloride infusion  100 mL Intravenous PRN Justin Mend, MD      . acetaminophen (TYLENOL) tablet 650 mg  650 mg Oral Q6H Patrecia Pace A, PA-C   650 mg at 10/06/19 1230  . albuterol (PROVENTIL) (2.5 MG/3ML) 0.083%  nebulizer solution 2.5 mg  2.5 mg Nebulization Q2H PRN Delray Alt, PA-C      . alteplase (CATHFLO ACTIVASE) injection 2 mg  2 mg Intracatheter Once PRN Justin Mend, MD      . Chlorhexidine Gluconate Cloth 2 % PADS 6 each  6 each Topical Daily Delray Alt, PA-C   6 each at 10/04/19 1401  . Chlorhexidine Gluconate Cloth 2 % PADS 6 each  6 each Topical Q0600 Delray Alt, PA-C   6 each at 10/06/19 0556  . cholecalciferol (VITAMIN D3) tablet 2,000 Units  2,000 Units Oral Daily Delray Alt, PA-C   2,000 Units at 10/06/19 1610  . Darbepoetin Alfa (ARANESP) injection 100 mcg  100 mcg Intravenous Q Wed-HD Justin Mend, MD      . diltiazem (CARDIZEM) tablet 60 mg  60 mg Oral Q6H [place] Eric J, DO   60 mg at 10/06/19 1230  . docusate sodium (COLACE) capsule 100 mg  100 mg Oral BID Delray Alt, PA-C   100 mg at 10/06/19 9604  . heparin injection 1,000 Units  1,000 Units Dialysis PRN Justin Mend, MD      . heparin injection 5,000 Units  5,000 Units Subcutaneous Q8H Delray Alt, PA-C   5,000 Units at 10/06/19 5409  . HYDROmorphone (DILAUDID) injection 0.5-1 mg  0.5-1 mg Intravenous Q4H PRN Delray Alt, PA-C   1 mg at 10/04/19 0518  . labetalol (NORMODYNE) injection 10 mg  10 mg Intravenous Q4H PRN Delray Alt, PA-C      . lidocaine (PF) (XYLOCAINE) 1 % injection 5 mL  5 mL Intradermal PRN Justin Mend, MD      . lidocaine-prilocaine (EMLA) cream 1 application  1 application Topical PRN Justin Mend, MD      . methocarbamol (ROBAXIN) tablet 500 mg  500 mg Oral Q6H PRN Delray Alt, PA-C   500 mg at 10/05/19 1233   Or  . methocarbamol (ROBAXIN) 500 mg in dextrose 5 % 50 mL IVPB  500 mg Intravenous Q6H PRN Delray Alt, PA-C      . metoCLOPramide (REGLAN) tablet 5-10 mg  5-10 mg Oral Q8H PRN Patrecia Pace A, PA-C       Or  . metoCLOPramide (REGLAN) injection 5-10 mg  5-10 mg Intravenous Q8H PRN Delray Alt, PA-C      . mupirocin ointment  (BACTROBAN) 2 % 1 application  1 application Nasal BID [place] Eric J, DO   1 application at 81/19/14 0914  . nicotine (NICODERM CQ - dosed in mg/24 hours) patch 14 mg  14 mg Transdermal Daily Lang Snow, FNP   14 mg at 10/06/19 0914  . ondansetron (ZOFRAN) tablet 4 mg  4 mg Oral Q6H PRN Patrecia Pace A, PA-C       Or  . ondansetron [facility]) injection 4 mg  4 mg Intravenous Q6H PRN Patrecia Pace A, PA-C      . oxyCODONE (Oxy IR/ROXICODONE) immediate release tablet 5-10 mg  5-10 mg Oral Q4H PRN Ricci Barker,  Sarah A, PA-C   10 mg at 10/06/19 0914  . pentafluoroprop-tetrafluoroeth (GEBAUERS) aerosol 1 application  1 application Topical PRN Justin Mend, MD      . polyethylene glycol (MIRALAX / GLYCOLAX) packet 17 g  17 g Oral Daily PRN Delray Alt, PA-C      . sevelamer carbonate (RENVELA) packet 3.2 g  3.2 g Oral TID WC Ricci Barker, Sarah A, PA-C      . sodium chloride 0.9 % bolus 500 mL  500 mL Intravenous Once [place] Eric J, DO      . sodium chloride flush (NS) 0.9 % injection 3 mL  3 mL Intravenous Q12H Patrecia Pace A, PA-C   3 mL at 10/06/19 1150  . sodium chloride flush (NS) 0.9 % injection 3 mL  3 mL Intravenous PRN Delray Alt, PA-C      . traZODone (DESYREL) tablet 50 mg  50 mg Oral QHS PRN Patrecia Pace A, PA-C   50 mg at 10/05/19 2314  . Vitamin D (Ergocalciferol) (DRISDOL) capsule 50,000 Units  50,000 Units Oral Q7 days Delray Alt, PA-C         Discharge Medications: Please see discharge summary for a list of discharge medications.  Relevant Imaging Results:  Relevant Lab Results:   Additional Information [id number]  Trula Ore, LCSWA

## 2019-10-06 NOTE — Care Management Important Message (Signed)
Important Message  Patient Details  Name: Steven Sellers MRN: [id number] Date of Birth: [birth year]   Medicare Important Message Given:  Yes     Shelda Altes 10/06/2019, 2:40 PM

## 2019-10-06 NOTE — TOC Initial Note (Signed)
Transition of Care [facility]) - Initial/Assessment Note    Patient Details  Name: Steven Sellers MRN: [id number] Date of Birth: 03-27-1961  Transition of Care [facility] CM/SW Contact:    Trula Ore, [place] Phone Number: 10/06/2019, 10:19 AM  Clinical Narrative:                  CSW spoke with patient at bedside. Patient gave CSW permission to fax out initial referral to [facility] area for SNF placement. Patients first choice for SNF placement would be [facility]. Patient provided CSW with his Dialyslis schedule. Patient told CSW he goes to [facility] on Tuesday,Thursday, and Saturday from 4:45pm-8:30 pm. CSW reached out to renal navigator Terri Piedra.  Pending bed offers. CSW will continue to follow.  Expected Discharge Plan: Skilled Nursing Facility Barriers to Discharge: Continued Medical Work up   Patient Goals and CMS Choice Patient states their goals for this hospitalization and ongoing recovery are:: to go to SNF CMS Medicare.gov Compare Post Acute Care list provided to:: Patient Choice offered to / list presented to : Patient  Expected Discharge Plan and Services Expected Discharge Plan: [place]       Living arrangements for the past 2 months: Single Family Home                                      Prior Living Arrangements/Services Living arrangements for the past 2 months: Single Family Home Lives with:: Self, Spouse Patient language and need for interpreter reviewed:: Yes Do you feel safe going back to the place where you live?: No   SNF  Need for Family Participation in Patient Care: Yes (Comment) Care giver support system in place?: Yes (comment)   Criminal Activity/Legal Involvement Pertinent to Current Situation/Hospitalization: No - Comment as needed  Activities of Daily Living Home Assistive Devices/Equipment: None ADL Screening (condition at time of admission) Patient's cognitive ability adequate to safely  complete daily activities?: No Is the patient deaf or have difficulty hearing?: No Does the patient have difficulty seeing, even when wearing glasses/contacts?: No Does the patient have difficulty concentrating, remembering, or making decisions?: No Patient able to express need for assistance with ADLs?: No Does the patient have difficulty dressing or bathing?: Yes Independently performs ADLs?: No Does the patient have difficulty walking or climbing stairs?: Yes Weakness of Legs: Both Weakness of Arms/Hands: Both  Permission Sought/Granted Permission sought to share information with : Case Manager, Customer service manager, Family Supports Permission granted to share information with : Yes, Verbal Permission Granted  Share Information with NAME: [name]  Permission granted to share info w AGENCY: SNF  Permission granted to share info w Relationship: Spouse  Permission granted to share info w Contact Information: [name] [phone]  Emotional Assessment Appearance:: Appears stated age Attitude/Demeanor/Rapport: Gracious Affect (typically observed): Calm Orientation: : Oriented to Self, Oriented to Place, Oriented to  Time, Oriented to Situation Alcohol / Substance Use: Not Applicable Psych Involvement: No (comment)  Admission diagnosis:  Femur fracture ([place]) [S72.90XA] Patient Active Problem List   Diagnosis Date Noted  . Atrial fibrillation with RVR ([place]) 10/03/2019  . Closed fracture of right distal femur ([place]) 10/02/2019  . Closed displaced supracondylar fracture of distal end of right femur with intracondylar extension ([place]) 10/01/2019  . Chronic a-fib ----Declined Anti-coagulation due to Frequent Bruising 10/01/2019  . H/o Pulmonary embolism -2012 10/01/2019  . Atrial fibrillation,  new onset ([place]) 02/26/2017  . First time seizure ([place])   . Left arm weakness 09/25/2016  . CHF (congestive heart failure) ([place]) 12/16/2015  . CKD (chronic kidney disease) requiring chronic  dialysis ([place]) 12/16/2015  . SOB (shortness of breath) 12/16/2015  . Central venous catheter in place   . Hyponatremia 11/30/2015  . Anemia 11/30/2015  . ESRD needing dialysis ([place]) 11/30/2015  . Acute renal failure (ARF) ([place]) 11/30/2015  . Hyperkalemia 05/12/2015  . 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 Muscle atrophy 04/25/2015  . Arm weakness 04/25/2015  . Leg weakness 04/25/2015  . Dysesthesia 04/25/2015  . Essential hypertension 05/21/2010  . H/o PULMONARY EMBOLISM in 2012 05/21/2010  . PLEURAL EFFUSION 05/21/2010  . RENAL FAILURE, ACUTE 05/21/2010   PCP:  Sharilyn Sites, MD Pharmacy:   [facility] [address] [place] [address] [place] [address] [place] [address] Phone: [phone] Fax: [phone]     Social Determinants of Health (SDOH) Interventions    Readmission Risk Interventions No flowsheet data found.

## 2019-10-07 DIAGNOSIS — S72461S Displaced supracondylar fracture with intracondylar extension of lower end of right femur, sequela: Secondary | ICD-10-CM | POA: Diagnosis not present

## 2019-10-07 DIAGNOSIS — G7100 Muscular dystrophy, unspecified: Secondary | ICD-10-CM | POA: Diagnosis not present

## 2019-10-07 DIAGNOSIS — I10 Essential (primary) hypertension: Secondary | ICD-10-CM | POA: Diagnosis not present

## 2019-10-07 DIAGNOSIS — M2550 Pain in unspecified joint: Secondary | ICD-10-CM | POA: Diagnosis not present

## 2019-10-07 DIAGNOSIS — Z4789 Encounter for other orthopedic aftercare: Secondary | ICD-10-CM | POA: Diagnosis not present

## 2019-10-07 DIAGNOSIS — W19XXXD Unspecified fall, subsequent encounter: Secondary | ICD-10-CM | POA: Diagnosis not present

## 2019-10-07 DIAGNOSIS — R52 Pain, unspecified: Secondary | ICD-10-CM | POA: Diagnosis not present

## 2019-10-07 DIAGNOSIS — R262 Difficulty in walking, not elsewhere classified: Secondary | ICD-10-CM | POA: Diagnosis not present

## 2019-10-07 DIAGNOSIS — I4891 Unspecified atrial fibrillation: Secondary | ICD-10-CM | POA: Diagnosis not present

## 2019-10-07 DIAGNOSIS — Z992 Dependence on renal dialysis: Secondary | ICD-10-CM | POA: Diagnosis not present

## 2019-10-07 DIAGNOSIS — D509 Iron deficiency anemia, unspecified: Secondary | ICD-10-CM | POA: Diagnosis not present

## 2019-10-07 DIAGNOSIS — M6281 Muscle weakness (generalized): Secondary | ICD-10-CM | POA: Diagnosis not present

## 2019-10-07 DIAGNOSIS — G129 Spinal muscular atrophy, unspecified: Secondary | ICD-10-CM | POA: Diagnosis not present

## 2019-10-07 DIAGNOSIS — N186 End stage renal disease: Secondary | ICD-10-CM | POA: Diagnosis not present

## 2019-10-07 DIAGNOSIS — N250 Renal osteodystrophy: Secondary | ICD-10-CM | POA: Diagnosis not present

## 2019-10-07 DIAGNOSIS — Z86711 Personal history of pulmonary embolism: Secondary | ICD-10-CM | POA: Diagnosis not present

## 2019-10-07 DIAGNOSIS — M6259 Muscle wasting and atrophy, not elsewhere classified, multiple sites: Secondary | ICD-10-CM | POA: Diagnosis not present

## 2019-10-07 DIAGNOSIS — D631 Anemia in chronic kidney disease: Secondary | ICD-10-CM | POA: Diagnosis not present

## 2019-10-07 DIAGNOSIS — E559 Vitamin D deficiency, unspecified: Secondary | ICD-10-CM | POA: Diagnosis not present

## 2019-10-07 DIAGNOSIS — N2581 Secondary hyperparathyroidism of renal origin: Secondary | ICD-10-CM | POA: Diagnosis not present

## 2019-10-07 DIAGNOSIS — F330 Major depressive disorder, recurrent, mild: Secondary | ICD-10-CM | POA: Diagnosis not present

## 2019-10-07 DIAGNOSIS — S72461D Displaced supracondylar fracture with intracondylar extension of lower end of right femur, subsequent encounter for closed fracture with routine healing: Secondary | ICD-10-CM | POA: Diagnosis not present

## 2019-10-07 DIAGNOSIS — Z720 Tobacco use: Secondary | ICD-10-CM | POA: Diagnosis not present

## 2019-10-07 DIAGNOSIS — G4709 Other insomnia: Secondary | ICD-10-CM | POA: Diagnosis not present

## 2019-10-07 DIAGNOSIS — D649 Anemia, unspecified: Secondary | ICD-10-CM | POA: Diagnosis not present

## 2019-10-07 DIAGNOSIS — I5030 Unspecified diastolic (congestive) heart failure: Secondary | ICD-10-CM | POA: Diagnosis not present

## 2019-10-07 DIAGNOSIS — Z7401 Bed confinement status: Secondary | ICD-10-CM | POA: Diagnosis not present

## 2019-10-07 DIAGNOSIS — R0902 Hypoxemia: Secondary | ICD-10-CM | POA: Diagnosis not present

## 2019-10-07 DIAGNOSIS — S7290XA Unspecified fracture of unspecified femur, initial encounter for closed fracture: Secondary | ICD-10-CM | POA: Diagnosis not present

## 2019-10-07 DIAGNOSIS — G7109 Other specified muscular dystrophies: Secondary | ICD-10-CM | POA: Diagnosis not present

## 2019-10-07 DIAGNOSIS — I132 Hypertensive heart and chronic kidney disease with heart failure and with stage 5 chronic kidney disease, or end stage renal disease: Secondary | ICD-10-CM | POA: Diagnosis not present

## 2019-10-07 DIAGNOSIS — I2699 Other pulmonary embolism without acute cor pulmonale: Secondary | ICD-10-CM | POA: Diagnosis not present

## 2019-10-07 DIAGNOSIS — S72461A Displaced supracondylar fracture with intracondylar extension of lower end of right femur, initial encounter for closed fracture: Secondary | ICD-10-CM | POA: Diagnosis not present

## 2019-10-07 DIAGNOSIS — I482 Chronic atrial fibrillation, unspecified: Secondary | ICD-10-CM | POA: Diagnosis not present

## 2019-10-07 DIAGNOSIS — I499 Cardiac arrhythmia, unspecified: Secondary | ICD-10-CM | POA: Diagnosis not present

## 2019-10-07 LAB — CBC
HCT: 28.2 % — ABNORMAL LOW (ref 39.0–52.0)
Hemoglobin: 8.9 g/dL — ABNORMAL LOW (ref 13.0–17.0)
MCH: 32.5 pg (ref 26.0–34.0)
MCHC: 31.6 g/dL (ref 30.0–36.0)
MCV: 102.9 fL — ABNORMAL HIGH (ref 80.0–100.0)
Platelets: 209 10*3/uL (ref 150–400)
RBC: 2.74 MIL/uL — ABNORMAL LOW (ref 4.22–5.81)
RDW: 13.7 % (ref 11.5–15.5)
WBC: 8.2 10*3/uL (ref 4.0–10.5)
nRBC: 0 % (ref 0.0–0.2)

## 2019-10-07 LAB — GLUCOSE, CAPILLARY: Glucose-Capillary: 112 mg/dL — ABNORMAL HIGH (ref 70–99)

## 2019-10-07 MED ORDER — POLYETHYLENE GLYCOL 3350 17 G PO PACK: 17.0000 g | PACK | Freq: Every day | ORAL | 0 refills | Status: AC | PRN

## 2019-10-07 MED ORDER — DOCUSATE SODIUM 100 MG PO CAPS: 100.0000 mg | ORAL_CAPSULE | Freq: Two times a day (BID) | ORAL | 0 refills | Status: AC

## 2019-10-07 MED ORDER — VITAMIN D (ERGOCALCIFEROL) 1.25 MG (50000 UNIT) PO CAPS: 50000.0000 [IU] | ORAL_CAPSULE | ORAL | 0 refills | Status: AC

## 2019-10-07 MED ORDER — DILTIAZEM HCL ER COATED BEADS 240 MG PO CP24: 240.0000 mg | ORAL_CAPSULE | Freq: Every day | ORAL | 0 refills | Status: DC

## 2019-10-07 MED ORDER — HEPARIN SODIUM (PORCINE) 5000 UNIT/ML IJ SOLN: 5000.0000 [IU] | Freq: Three times a day (TID) | INTRAMUSCULAR | 0 refills | Status: DC

## 2019-10-07 MED ORDER — DARBEPOETIN ALFA 100 MCG/0.5ML IJ SOSY
100.0000 ug | PREFILLED_SYRINGE | INTRAMUSCULAR | Status: DC
  Filled 2019-10-07: qty 0.5

## 2019-10-07 MED ORDER — ONDANSETRON HCL 4 MG PO TABS: 4.0000 mg | ORAL_TABLET | Freq: Four times a day (QID) | ORAL | 0 refills | Status: AC | PRN

## 2019-10-07 MED ORDER — OXYCODONE HCL 5 MG PO TABS: 5.0000 mg | ORAL_TABLET | Freq: Four times a day (QID) | ORAL | 0 refills | Status: AC | PRN

## 2019-10-07 MED ORDER — DARBEPOETIN ALFA 100 MCG/0.5ML IJ SOSY: 100.0000 ug | PREFILLED_SYRINGE | INTRAMUSCULAR | Status: AC

## 2019-10-07 MED ORDER — VITAMIN D3 50 MCG (2000 UT) PO CAPS: 2000.0000 [IU] | ORAL_CAPSULE | Freq: Every day | ORAL | 0 refills | Status: AC

## 2019-10-07 MED ORDER — TRAZODONE HCL 50 MG PO TABS: 50.0000 mg | ORAL_TABLET | Freq: Every evening | ORAL | 0 refills | Status: AC | PRN

## 2019-10-07 NOTE — Progress Notes (Signed)
PT Cancellation Note  Patient Details Name: Steven Sellers MRN: [id number] DOB: December 18, 1960   Cancelled Treatment:    Reason Eval/Treat Not Completed: (P) Medical issues which prohibited therapy RN reports pt is vomiting and asks to defer treatment. PT will follow back this afternoon as able.   Adir Schicker B. Migdalia Dk PT, DPT Acute Rehabilitation Services Pager [phone] Office [phone]    [place] 10/07/2019, 10:19 AM

## 2019-10-07 NOTE — Discharge Summary (Signed)
Physician Discharge Summary  Steven Sellers YDX:[id number] DOB: Mar 30, 1961 DOA: 10/01/2019  PCP: Sharilyn Sites, MD  Admit date: 10/01/2019 Discharge date: 10/07/2019  Admitted From: Home Disposition: [facility]    Recommendations for Outpatient Follow-up:  1. Follow up with PCP in 1-2 weeks 2. Follow-up with orthopedics, Dr. Doreatha Martin in 2 weeks 3. Continue postoperative DVT prophylaxis with heparin every 8 hours 4. Started on 5. Please obtain BMP/CBC in one week 6. Please follow up on the following pending results:  Home Health: No Equipment/Devices: None  Discharge Condition: Stable CODE STATUS: Full code Diet recommendation: Renal diet, low-salt  History of present illness: Steven Sellers is an 59 y.o.malepast medical history that includes limb-girdle muscular dystrophy, end-stage renal disease on dialysis Tuesday Thursday Saturday, PE in 2012, chronic A. fib not on anticoagulation, hypertension admitted May 22 with right femur fracture after mechanical fall at the dialysis center.  Hospital course:  Right distal femur fracture Patient presented following mechanical fall at hemodialysis. Imaging notable for distal right femur fracture. Patient underwent ORIF on 10/03/2019 by Dr. Doreatha Martin.  Patient is touchdown weightbearing status right lower extremity.  Continue pain control with Tylenol, Robaxin, oxycodone as needed.  Continue postoperative DVT prophylaxis with heparin every 8 hours x21 days.  Will need 2-week follow-up with orthopedics, Dr. Doreatha Martin for repeat x-rays and suture removal.  Discharging to [facility].  A. fib with RVR Following surgical invention, patient developed A. fib with RVR in PACU, initially requiring Cardizem drip which was transitioned back to oral Cardizem.  Continue Cardizem CD 240 mg p.o. daily.  Not on chronic anticoagulation due to recurrent falls and patient declines due to ecchymosis.  ESRD on HD TTS Patient follows with outpatient  nephrology and dialysis in [place].  Chronic diastolic congestive heart failure, compensated Hx of essential hypertension TTE 2017 with EF 72-09% with diastolic dysfunction. Volume managed with HD. Continue Cardizem.  Continue HD per nephrology  Limb-girdle muscular dystrophy Appears to be at baseline.  Continue therapy at rehab.  Tobacco use disorder Counseled on need for cessation  Anemia of chronic disease with likely component of postoperative blood loss anemia Hemoglobin stable, 7.6 at time of discharge. Continue Aranesp weekly with HD  Vitamin D deficiency drisdol 50,000 units PO q7 days and vitamin D3 2000 units p.o. daily  Discharge Diagnoses:  Principal Problem:   Closed displaced supracondylar fracture of distal end of right femur with intracondylar extension ([place]) Active Problems:   Essential hypertension   Muscle atrophy   Limb-girdle muscular dystrophy ([place])   Anemia   ESRD needing dialysis ([place])   Chronic a-fib ----Declined Anti-coagulation due to Frequent Bruising   Atrial fibrillation with RVR [facility])    Discharge Instructions  Discharge Instructions    Diet - low sodium heart healthy   Complete by: As directed    Increase activity slowly   Complete by: As directed      Allergies as of 10/07/2019      Reactions   Other Other (See Comments)   IV contrast- Renal issues      Medication List    STOP taking these medications   cefpodoxime 200 MG tablet Commonly known as: VANTIN   diltiazem 30 MG tablet Commonly known as: CARDIZEM   traMADol 50 MG tablet Commonly known as: ULTRAM   warfarin 2.5 MG tablet Commonly known as: COUMADIN     TAKE these medications   acetaminophen 500 MG tablet Commonly known as: TYLENOL Take 1,000 mg by mouth  every 6 (six) hours as needed for mild pain or moderate pain.   Darbepoetin Alfa 100 MCG/0.5ML Sosy injection Commonly known as: ARANESP Inject 0.5 mLs (100 mcg total) into the vein every Friday with  hemodialysis.   diltiazem 240 MG 24 hr capsule Commonly known as: CARDIZEM CD Take 1 capsule (240 mg total) by mouth daily. Start taking on: Oct 08, 2019   docusate sodium 100 MG capsule Commonly known as: COLACE Take 1 capsule (100 mg total) by mouth 2 (two) times daily.   heparin 5000 UNIT/ML injection Inject 1 mL (5,000 Units total) into the skin every 8 (eight) hours for 21 days.   lidocaine-prilocaine cream Commonly known as: EMLA Apply 1 application topically once.   methocarbamol 500 MG tablet Commonly known as: ROBAXIN Take 1 tablet (500 mg total) by mouth every 6 (six) hours as needed for muscle spasms.   ondansetron 4 MG tablet Commonly known as: ZOFRAN Take 1 tablet (4 mg total) by mouth every 6 (six) hours as needed for up to 15 days for nausea.   oxyCODONE 5 MG immediate release tablet Commonly known as: Oxy IR/ROXICODONE Take 1 tablet (5 mg total) by mouth every 6 (six) hours as needed for moderate pain or severe pain.   oxyCODONE-acetaminophen 5-325 MG tablet Commonly known as: Percocet Take 1 tablet by mouth every 4 (four) hours as needed for severe pain.   polyethylene glycol 17 g packet Commonly known as: MIRALAX / GLYCOLAX Take 17 g by mouth daily as needed for mild constipation.   sevelamer carbonate 0.8 g Pack packet Commonly known as: RENVELA Take 3.2 g by mouth 3 (three) times daily with meals.   traZODone 50 MG tablet Commonly known as: DESYREL Take 1 tablet (50 mg total) by mouth at bedtime as needed for sleep.   Vitamin D (Ergocalciferol) 1.25 MG (50000 UNIT) Caps capsule Commonly known as: DRISDOL Take 1 capsule (50,000 Units total) by mouth every 7 (seven) days.   Vitamin D3 50 MCG (2000 UT) capsule Take 1 capsule (2,000 Units total) by mouth daily.       Contact information for follow-up providers    Haddix, Thomasene Lot, MD. Schedule an appointment as soon as possible for a visit in 2 weeks.   Specialty: Orthopedic Surgery Why: For  suture removal, For wound re-check Contact information: [place] [address] [phone]        Sharilyn Sites, MD. Schedule an appointment as soon as possible for a visit in 1 week(s).   Specialty: Family Medicine Contact information: [address] [place] [address] [phone]            Contact information for after-discharge care    [facility] .   Service: Skilled Nursing Contact information: [address] [address] [phone]                 Allergies  Allergen Reactions  . Other Other (See Comments)    IV contrast- Renal issues    Consultations:  Orthopedics - Dr. Doreatha Martin   Procedures/Studies: DG Knee 1-2 Views Right  Result Date: 10/01/2019 CLINICAL DATA:  59 year old male with a history of leg injury EXAM: RIGHT KNEE - 1-2 VIEW COMPARISON:  None. FINDINGS: Comminuted fracture of the right femur at the distal metadiaphysis. Volar angulation is estimated 75 degrees. Associated soft tissue swelling. IMPRESSION: Single view of the distal femur demonstrates comminuted fracture of the distal femoral  metadiaphysis with approximately 75 degree angulation. Electronically Signed   By: Corrie Mckusick D.O.   On: 10/01/2019 11:28   CT KNEE RIGHT WO CONTRAST  Result Date: 10/01/2019 CLINICAL DATA:  Fall today. Distal femur fracture. Hemodialysis patient. EXAM: CT OF THE RIGHT KNEE WITHOUT CONTRAST TECHNIQUE: Multidetector CT imaging of the right knee was performed according to the standard protocol. Multiplanar CT image reconstructions were also generated. COMPARISON:  Radiographs same date. FINDINGS: Bones/Joint/Cartilage The knee is splinted. There is a comminuted and displaced fracture of the distal femoral metadiaphysis with improved alignment compared with the original radiographs. The fracture demonstrates up to 1.8 cm of impaction, and there are several large butterfly  fragments both anteriorly and posteriorly. There is distal intra-articular extension of an intercondylar fracture which is essentially nondisplaced. This involves the central trochlea, but not the weight-bearing articular surfaces of either femoral condyle. The patella is located and intact. The proximal tibia and proximal fibula are intact. Moderate size lipohemarthrosis. Ligaments Suboptimally assessed by CT. The cruciate ligaments and menisci are grossly intact. Muscles and Tendons Intact extensor mechanism. Mild muscular atrophy in the distal thigh. Soft tissues Soft tissue swelling around the distal femoral fracture without significant focal hematoma. No evidence of neurovascular injury. Femoropopliteal atherosclerosis noted. Linear calcifications within Hoffa's fat are likely vascular as well. IMPRESSION: 1. Comminuted and displaced fracture of the distal femoral metadiaphysis with improved alignment compared with the original radiographs. 2. Distal intra-articular extension of an intercondylar fracture which is essentially nondisplaced. No involvement of the weight-bearing articular surfaces of either femoral condyle. 3. Moderate size lipohemarthrosis. 4. No evidence of neurovascular injury. Electronically Signed   By: Richardean Sale M.D.   On: 10/01/2019 16:39   DG Chest Port 1 View  Result Date: 10/01/2019 CLINICAL DATA:  59 year old male with a history of femoral fracture EXAM: PORTABLE CHEST 1 VIEW COMPARISON:  06/09/2018 FINDINGS: Cardiomediastinal silhouette unchanged in size and contour. Low lung volumes. Improved aeration compared to the prior with coarsened interstitial markings. No pneumothorax or pleural effusion. No confluent airspace disease. Elevation of the right hemidiaphragm. No displaced fracture. IMPRESSION: Chronic lung changes with improved aeration compared to the prior chest x-ray and no evidence of acute cardiopulmonary disease Electronically Signed   By: Corrie Mckusick D.O.   On:  10/01/2019 13:49   DG Knee Right Port  Result Date: 10/03/2019 CLINICAL DATA:  Right distal femur ORIF. EXAM: PORTABLE RIGHT KNEE - 1-2 VIEW COMPARISON:  Intraoperative x-rays from same day. CT right knee dated Oct 01, 2019. FINDINGS: Interval lateral plate and screw fixation of the comminuted distal femoral metadiaphyseal fracture, in near anatomic alignment. No evidence of hardware failure or loosening. Expected postsurgical changes and subcutaneous emphysema in the surrounding soft tissues and knee joint. IMPRESSION: Interval right distal femoral metadiaphyseal fracture ORIF in near anatomic alignment. Electronically Signed   By: Titus Dubin M.D.   On: 10/03/2019 10:26   DG Knee Right Port  Result Date: 10/01/2019 CLINICAL DATA:  Status post reduction EXAM: PORTABLE RIGHT KNEE - 2 VIEW COMPARISON:  Film from earlier in the same day. FINDINGS: Previously seen comminuted distal femoral fracture has been reduced and casted. Fracture fragments are in near anatomic alignment. The fracture line extends to the articular surface of the distal femur. IMPRESSION: Status post reduction and casting. Electronically Signed   By: Inez Catalina M.D.   On: 10/01/2019 13:51   DG C-Arm 1-60 Min  Result Date: 10/03/2019 CLINICAL DATA:  Distal femur fracture ORIF. EXAM: RIGHT  FEMUR 2 VIEWS; DG C-ARM 1-60 MIN FLUOROSCOPY TIME:  57 seconds. C-arm fluoroscopic images were obtained intraoperatively and submitted for post operative interpretation. COMPARISON:  CT right knee dated Oct 01, 2019. FINDINGS: Multiple intraoperative fluoroscopic images demonstrate interval lateral plate and screw fixation of the comminuted right distal femoral metadiaphyseal fracture, in near anatomic alignment. IMPRESSION: Intraoperative fluoroscopic guidance for right distal femur fracture ORIF. Electronically Signed   By: Titus Dubin M.D.   On: 10/03/2019 09:44   DG FEMUR, MIN 2 VIEWS RIGHT  Result Date: 10/03/2019 CLINICAL DATA:   Distal femur fracture ORIF. EXAM: RIGHT FEMUR 2 VIEWS; DG C-ARM 1-60 MIN FLUOROSCOPY TIME:  57 seconds. C-arm fluoroscopic images were obtained intraoperatively and submitted for post operative interpretation. COMPARISON:  CT right knee dated Oct 01, 2019. FINDINGS: Multiple intraoperative fluoroscopic images demonstrate interval lateral plate and screw fixation of the comminuted right distal femoral metadiaphyseal fracture, in near anatomic alignment. IMPRESSION: Intraoperative fluoroscopic guidance for right distal femur fracture ORIF. Electronically Signed   By: Titus Dubin M.D.   On: 10/03/2019 09:44      Subjective: Patient seen and examined bedside, family present.  No specific complaints this morning.  Ready for discharge to SNF.  Denies headache, no fever/chills/night sweats, no chest pain, no palpitations, no shortness of breath, no abdominal pain.  No acute events overnight per nursing staff.  Discharge Exam: Vitals:   10/07/19 0523 10/07/19 0908  BP: (!) 145/78 126/76  Pulse: 98   Resp: 17   Temp: 98.6 F (37 C)   SpO2: 97%    Vitals:   10/06/19 2302 10/07/19 0404 10/07/19 0523 10/07/19 0908  BP: 116/73  (!) 145/78 126/76  Pulse: 78  98   Resp: 11 15 17    Temp: 98.2 F (36.8 C)  98.6 F (37 C)   TempSrc: Oral  Oral   SpO2: 97%  97%   Weight:   64.1 kg   Height:        General: Pt is alert, awake, not in acute distress Cardiovascular: RRR, S1/S2 +, no rubs, no gallops Respiratory: CTA bilaterally, no wheezing, no rhonchi Abdominal: Soft, NT, ND, bowel sounds + Extremities: no edema, no cyanosis, surgical site noted, clean/dry/intact    The results of significant diagnostics from this hospitalization (including imaging, microbiology, ancillary and laboratory) are listed below for reference.     Microbiology: Recent Results (from the past 240 hour(s))  SARS Coronavirus 2 by RT PCR (hospital order, performed in [facility] hospital lab) Nasopharyngeal  Nasopharyngeal Swab     Status: None   Collection Time: 10/01/19 12:15 PM   Specimen: Nasopharyngeal Swab  Result Value Ref Range Status   SARS Coronavirus 2 NEGATIVE NEGATIVE Final    Comment: (NOTE) SARS-CoV-2 target nucleic acids are NOT DETECTED. The SARS-CoV-2 RNA is generally detectable in upper and lower respiratory specimens during the acute phase of infection. The lowest concentration of SARS-CoV-2 viral copies this assay can detect is 250 copies / mL. A negative result does not preclude SARS-CoV-2 infection and should not be used as the sole basis for treatment or other patient management decisions.  A negative result may occur with improper specimen collection / handling, submission of specimen other than nasopharyngeal swab, presence of viral mutation(s) within the areas targeted by this assay, and inadequate number of viral copies (<250 copies / mL). A negative result must be combined with clinical observations, patient history, and epidemiological information. Fact Sheet for Patients:   StrictlyIdeas.no Fact Sheet for  Healthcare Providers: BankingDealers.co.za This test is not yet approved or cleared  by the [place] and has been authorized for detection and/or diagnosis of SARS-CoV-2 by FDA under an Emergency Use Authorization (EUA).  This EUA will remain in effect (meaning this test can be used) for the duration of the COVID-19 declaration under Section 564(b)(1) of the Act, 21 U.S.C. section 360bbb-3(b)(1), unless the authorization is terminated or revoked sooner. Performed at [facility], [address], [place], [place] [address]   Surgical pcr screen     Status: Abnormal   Collection Time: 10/02/19  6:58 AM   Specimen: Nasal Mucosa; Nasal Swab  Result Value Ref Range Status   MRSA, PCR NEGATIVE NEGATIVE Final   Staphylococcus aureus POSITIVE (A) NEGATIVE Final    Comment: RESULT CALLED TO, READ BACK BY AND  VERIFIED WITH: RN Jacob Moores KING 10/02/19 0920 KB (NOTE) The Xpert SA Assay (FDA approved for NASAL specimens in patients 5 years of age and older), is one component of a comprehensive surveillance program. It is not intended to diagnose infection nor to guide or monitor treatment.   SARS Coronavirus 2 by RT PCR (hospital order, performed in [facility] hospital lab) Nasopharyngeal Nasopharyngeal Swab     Status: None   Collection Time: 10/05/19  9:40 AM   Specimen: Nasopharyngeal Swab  Result Value Ref Range Status   SARS Coronavirus 2 NEGATIVE NEGATIVE Final    Comment: (NOTE) SARS-CoV-2 target nucleic acids are NOT DETECTED. The SARS-CoV-2 RNA is generally detectable in upper and lower respiratory specimens during the acute phase of infection. The lowest concentration of SARS-CoV-2 viral copies this assay can detect is 250 copies / mL. A negative result does not preclude SARS-CoV-2 infection and should not be used as the sole basis for treatment or other patient management decisions.  A negative result may occur with improper specimen collection / handling, submission of specimen other than nasopharyngeal swab, presence of viral mutation(s) within the areas targeted by this assay, and inadequate number of viral copies (<250 copies / mL). A negative result must be combined with clinical observations, patient history, and epidemiological information. Fact Sheet for Patients:   StrictlyIdeas.no Fact Sheet for Healthcare Providers: BankingDealers.co.za This test is not yet approved or cleared  by the [place] FDA and has been authorized for detection and/or diagnosis of SARS-CoV-2 by FDA under an Emergency Use Authorization (EUA).  This EUA will remain in effect (meaning this test can be used) for the duration of the COVID-19 declaration under Section 564(b)(1) of the Act, 21 U.S.C. section 360bbb-3(b)(1), unless the authorization is  terminated or revoked sooner. Performed at [facility], [place] [address], [place], [place] [address]      Labs: BNP (last 3 results) No results for input(s): BNP in the last 8760 hours. Basic Metabolic Panel: Recent Labs  Lab 10/02/19 0330 10/02/19 0330 10/03/19 0220 10/03/19 0654 10/04/19 0445 10/05/19 0333 10/06/19 0509  NA 142   < > 139 138 136 136 135  K 5.4*   < > 6.1* 5.5* 5.9* 3.8 4.5  CL 102   < > 98 96* 95* 95* 97*  CO2 29  --  27  --  26 29 26   GLUCOSE 89   < > 78 90 128* 108* 90  BUN 14   < > 24* 26* 40* 19 33*  CREATININE 4.06*   < > 5.28* 5.30* 6.42* 3.29* 4.61*  CALCIUM 8.3*  --  8.3*  --  7.9*  8.2* 7.7*  MG  --   --   --   --   --   --  1.9  PHOS  --   --  7.6*  --   --   --   --    < > = values in this interval not displayed.   Liver Function Tests: Recent Labs  Lab 10/01/19 1113  AST 12*  ALT 11  ALKPHOS 65  BILITOT 0.5  PROT 5.0*  ALBUMIN 2.5*   No results for input(s): LIPASE, AMYLASE in the last 168 hours. No results for input(s): AMMONIA in the last 168 hours. CBC: Recent Labs  Lab 10/01/19 1113 10/01/19 1113 10/02/19 0330 10/02/19 0330 10/03/19 0654 10/04/19 0445 10/05/19 0333 10/06/19 0509 10/07/19 0343  WBC 9.5   < > 11.8*  --   --  8.4 8.7 6.8 8.2  NEUTROABS 7.3  --   --   --   --   --   --   --   --   HGB 12.4*   < > 11.4*   < > 9.9* 7.6* 7.5* 7.6* 8.9*  HCT 39.6   < > 36.8*   < > 29.0* 23.7* 24.2* 23.8* 28.2*  MCV 105.0*   < > 105.4*  --   --  103.0* 104.8* 103.0* 102.9*  PLT 246   < > 280  --   --  170 178 212 209   < > = values in this interval not displayed.   Cardiac Enzymes: No results for input(s): CKTOTAL, CKMB, CKMBINDEX, TROPONINI in the last 168 hours. BNP: Invalid input(s): POCBNP CBG: Recent Labs  Lab 10/05/19 2114 10/06/19 0749 10/06/19 1142 10/06/19 1746 10/07/19 0030  GLUCAP 98 74 109* 98 112*   D-Dimer No results for input(s): DDIMER in the last 72 hours. Hgb A1c No results for input(s):  HGBA1C in the last 72 hours. Lipid Profile No results for input(s): CHOL, HDL, LDLCALC, TRIG, CHOLHDL, LDLDIRECT in the last 72 hours. Thyroid function studies No results for input(s): TSH, T4TOTAL, T3FREE, THYROIDAB in the last 72 hours.  Invalid input(s): FREET3 Anemia work up Recent Labs    10/06/19 0509  FERRITIN 428*  TIBC 113*  IRON 82   Urinalysis    Component Value Date/Time   COLORURINE YELLOW 05/21/2016 [place] 05/21/2016 0941   LABSPEC 1.020 05/21/2016 0941   PHURINE 8.0 05/21/2016 0941   GLUCOSEU NEGATIVE 05/21/2016 0941   HGBUR LARGE (A) 05/21/2016 0941   BILIRUBINUR NEGATIVE 05/21/2016 0941   KETONESUR NEGATIVE 05/21/2016 0941   PROTEINUR >300 (A) 05/21/2016 0941   UROBILINOGEN 0.2 04/11/2010 2336   NITRITE NEGATIVE 05/21/2016 0941   LEUKOCYTESUR NEGATIVE 05/21/2016 0941   Sepsis Labs Invalid input(s): PROCALCITONIN,  WBC,  LACTICIDVEN Microbiology Recent Results (from the past 240 hour(s))  SARS Coronavirus 2 by RT PCR (hospital order, performed in [place] hospital lab) Nasopharyngeal Nasopharyngeal Swab     Status: None   Collection Time: 10/01/19 12:15 PM   Specimen: Nasopharyngeal Swab  Result Value Ref Range Status   SARS Coronavirus 2 NEGATIVE NEGATIVE Final    Comment: (NOTE) SARS-CoV-2 target nucleic acids are NOT DETECTED. The SARS-CoV-2 RNA is generally detectable in upper and lower respiratory specimens during the acute phase of infection. The lowest concentration of SARS-CoV-2 viral copies this assay can detect is 250 copies / mL. A negative result does not preclude SARS-CoV-2 infection and should not be used as the sole basis for treatment or other  patient management decisions.  A negative result may occur with improper specimen collection / handling, submission of specimen other than nasopharyngeal swab, presence of viral mutation(s) within the areas targeted by this assay, and inadequate number of viral copies (<250  copies / mL). A negative result must be combined with clinical observations, patient history, and epidemiological information. Fact Sheet for Patients:   StrictlyIdeas.no Fact Sheet for Healthcare Providers: BankingDealers.co.za This test is not yet approved or cleared  by the [place] FDA and has been authorized for detection and/or diagnosis of SARS-CoV-2 by FDA under an Emergency Use Authorization (EUA).  This EUA will remain in effect (meaning this test can be used) for the duration of the COVID-19 declaration under Section 564(b)(1) of the Act, 21 U.S.C. section 360bbb-3(b)(1), unless the authorization is terminated or revoked sooner. Performed at [facility], [address], [place], [place] [address]   Surgical pcr screen     Status: Abnormal   Collection Time: 10/02/19  6:58 AM   Specimen: Nasal Mucosa; Nasal Swab  Result Value Ref Range Status   MRSA, PCR NEGATIVE NEGATIVE Final   Staphylococcus aureus POSITIVE (A) NEGATIVE Final    Comment: RESULT CALLED TO, READ BACK BY AND VERIFIED WITH: RN Jacob Moores KING 10/02/19 0920 KB (NOTE) The Xpert SA Assay (FDA approved for NASAL specimens in patients 31 years of age and older), is one component of a comprehensive surveillance program. It is not intended to diagnose infection nor to guide or monitor treatment.   SARS Coronavirus 2 by RT PCR (hospital order, performed in [facility] hospital lab) Nasopharyngeal Nasopharyngeal Swab     Status: None   Collection Time: 10/05/19  9:40 AM   Specimen: Nasopharyngeal Swab  Result Value Ref Range Status   SARS Coronavirus 2 NEGATIVE NEGATIVE Final    Comment: (NOTE) SARS-CoV-2 target nucleic acids are NOT DETECTED. The SARS-CoV-2 RNA is generally detectable in upper and lower respiratory specimens during the acute phase of infection. The lowest concentration of SARS-CoV-2 viral copies this assay can detect is 250 copies / mL. A negative  result does not preclude SARS-CoV-2 infection and should not be used as the sole basis for treatment or other patient management decisions.  A negative result may occur with improper specimen collection / handling, submission of specimen other than nasopharyngeal swab, presence of viral mutation(s) within the areas targeted by this assay, and inadequate number of viral copies (<250 copies / mL). A negative result must be combined with clinical observations, patient history, and epidemiological information. Fact Sheet for Patients:   StrictlyIdeas.no Fact Sheet for Healthcare Providers: BankingDealers.co.za This test is not yet approved or cleared  by the [place] FDA and has been authorized for detection and/or diagnosis of SARS-CoV-2 by FDA under an Emergency Use Authorization (EUA).  This EUA will remain in effect (meaning this test can be used) for the duration of the COVID-19 declaration under Section 564(b)(1) of the Act, 21 U.S.C. section 360bbb-3(b)(1), unless the authorization is terminated or revoked sooner. Performed at [facility] Lab, [place] [address], [place], [place] [address]      Time coordinating discharge: Over 30 minutes  SIGNED:   Matheson Vandehei J [place] DO  [facility] 10/07/2019, 1:22 PM

## 2019-10-07 NOTE — Progress Notes (Signed)
[facility] Progress Note   Subjective: Some nausea this morning but otherwise no complaints.  Tolerating dialysis without any issues.  Patient is likely discharging today  Objective Vitals:   10/06/19 2302 10/07/19 0404 10/07/19 0523 10/07/19 0908  BP: 116/73  (!) 145/78 126/76  Pulse: 78  98   Resp: 11 15 17    Temp: 98.2 F (36.8 C)  98.6 F (37 C)   TempSrc: Oral  Oral   SpO2: 97%  97%   Weight:   64.1 kg   Height:       Physical Exam General: frail, thin, comfortable Heart: Normal rate Lungs: normal WOB bilateral chest rise Abdomen: soft, bowel sounds present Extremities: No ankle edema, warm Dialysis Access: LUE AVF +t/b, 1+ LUE edema unchanged  Additional Objective Labs: Basic Metabolic Panel: Recent Labs  Lab 10/03/19 0220 10/03/19 0654 10/04/19 0445 10/05/19 0333 10/06/19 0509  NA 139   < > 136 136 135  K 6.1*   < > 5.9* 3.8 4.5  CL 98   < > 95* 95* 97*  CO2 27  --  26 29 26   GLUCOSE 78   < > 128* 108* 90  BUN 24*   < > 40* 19 33*  CREATININE 5.28*   < > 6.42* 3.29* 4.61*  CALCIUM 8.3*  --  7.9* 8.2* 7.7*  PHOS 7.6*  --   --   --   --    < > = values in this interval not displayed.   Liver Function Tests: Recent Labs  Lab 10/01/19 1113  AST 12*  ALT 11  ALKPHOS 65  BILITOT 0.5  PROT 5.0*  ALBUMIN 2.5*   No results for input(s): LIPASE, AMYLASE in the last 168 hours. CBC: Recent Labs  Lab 10/01/19 1113 10/01/19 1113 10/02/19 0330 10/03/19 0654 10/04/19 0445 10/04/19 0445 10/05/19 0333 10/06/19 0509 10/07/19 0343  WBC 9.5   < > 11.8*  --  8.4   < > 8.7 6.8 8.2  NEUTROABS 7.3  --   --   --   --   --   --   --   --   HGB 12.4*   < > 11.4*   < > 7.6*   < > 7.5* 7.6* 8.9*  HCT 39.6   < > 36.8*   < > 23.7*   < > 24.2* 23.8* 28.2*  MCV 105.0*   < > 105.4*  --  103.0*  --  104.8* 103.0* 102.9*  PLT 246   < > 280  --  170   < > 178 212 209   < > = values in this interval not displayed.   Blood Culture    Component Value  Date/Time   SDES URINE, CLEAN CATCH 05/21/2016 0941   SPECREQUEST NONE 05/21/2016 0941   CULT MULTIPLE SPECIES PRESENT, SUGGEST RECOLLECTION (A) 05/21/2016 0941   REPTSTATUS 05/23/2016 FINAL 05/21/2016 0941    Cardiac Enzymes: No results for input(s): CKTOTAL, CKMB, CKMBINDEX, TROPONINI in the last 168 hours. CBG: Recent Labs  Lab 10/05/19 2114 10/06/19 0749 10/06/19 1142 10/06/19 1746 10/07/19 0030  GLUCAP 98 74 109* 98 112*   Iron Studies:  Recent Labs    10/06/19 0509  IRON 82  TIBC 113*  FERRITIN 428*   @lablastinr3 @ Studies/Results: No results found. Medications: . sodium chloride    . methocarbamol (ROBAXIN) IV    . sodium chloride     . acetaminophen  650 mg Oral Q6H  . Chlorhexidine Gluconate  Cloth  6 each Topical Daily  . Chlorhexidine Gluconate Cloth  6 each Topical Q0600  . cholecalciferol  2,000 Units Oral Daily  . darbepoetin (ARANESP) injection - DIALYSIS  100 mcg Intravenous Q Fri-HD  . diltiazem  240 mg Oral Daily  . docusate sodium  100 mg Oral BID  . heparin injection (subcutaneous)  5,000 Units Subcutaneous Q8H  . mupirocin ointment  1 application Nasal BID  . nicotine  14 mg Transdermal Daily  . sevelamer carbonate  3.2 g Oral TID WC  . sodium chloride flush  3 mL Intravenous Q12H  . Vitamin D (Ergocalciferol)  50,000 Units Oral Q7 days    Dialysis Orders:  Assessment/Plan: 1. ESRD - TTS [facility] 1st shift.  HD yesterday.  Has some LUE edema which he says is actually improved -- may need outpt angiogram; we've not had any issues using the AVF here and this can be done outpt.  Likely discharging today and discharge summary has been sent to his dialysis unit  2. Anemia - Hb from 11s decreased to sevens expect operative loss. aranesp 100 on 5/27. Iron replete this admit.  Hemoglobin improved to 8.9 today. 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 Right distal femoral fracture - s/p OR  5/24, SNF recommended 5. Muscular dystrophy 6. Chronic atrial fibrillation - rate control  7. HFpEF  With diastolic dysfunction, no signs of decompensation at this time 8. HTN - BP low but stable, tolerating HD ok.  9. A fib -some RVR this admission now resolved and on p.o. medication.

## 2019-10-07 NOTE — Progress Notes (Signed)
OT Cancellation Note  Patient Details Name: Steven Sellers MRN: [id number] DOB: 29-Jun-1960   Cancelled Treatment:      Reason Eval/Treat Not Completed: (P) Medical issues which prohibited therapy. RN reports pt is vomiting and asks to defer treatment. OT will follow back this afternoon as able. Thank you.   Francesca Jewett 10/07/2019, 10:55 AM

## 2019-10-07 NOTE — Progress Notes (Signed)
Renal Navigator faxed discharge summary and most recent renal note to patient's OP HD clinic/[facility] to notify of patient's discharge to SNF and provide continuity of care.   Alphonzo Cruise, [place] Renal Navigator [phone]

## 2019-10-07 NOTE — TOC Transition Note (Signed)
Transition of Care [facility]) - CM/SW Discharge Note   Patient Details  Name: Steven Sellers MRN: [id number] Date of Birth: June 08, 1960  [facility]) CM/SW Contact:  Trula Ore, [place] Phone Number: 10/07/2019, 1:49 PM   Clinical Narrative:     Patient will DC to: [facility]  Anticipated DC date: 10/07/2019  Family notified: [name]     Transport by: Corey Harold  ?  Per MD patient ready for DC to . RN, patient, patient's family, and facility notified of DC. Discharge Summary sent to facility. RN given number for report tele# [phone] 500 hall nurse. DC packet on chart. Ambulance transport requested for patient.  CSW signing off.   Final next level of care: Skilled Nursing Facility Barriers to Discharge: No Barriers Identified   Patient Goals and CMS Choice Patient states their goals for this hospitalization and ongoing recovery are:: SNF CMS Medicare.gov Compare Post Acute Care list provided to:: Patient Choice offered to / list presented to : Patient  Discharge Placement              Patient chooses bed at: [facility] Patient to be transferred to facility by: [place] Name of family member notified: [name] Patient and family notified of of transfer: 10/07/19  Discharge Plan and Services                                     Social Determinants of Health (SDOH) Interventions     Readmission Risk Interventions No flowsheet data found.

## 2019-10-08 DIAGNOSIS — N186 End stage renal disease: Secondary | ICD-10-CM | POA: Diagnosis not present

## 2019-10-08 DIAGNOSIS — N2581 Secondary hyperparathyroidism of renal origin: Secondary | ICD-10-CM | POA: Diagnosis not present

## 2019-10-08 DIAGNOSIS — D509 Iron deficiency anemia, unspecified: Secondary | ICD-10-CM | POA: Diagnosis not present

## 2019-10-08 DIAGNOSIS — Z992 Dependence on renal dialysis: Secondary | ICD-10-CM | POA: Diagnosis not present

## 2019-10-10 DIAGNOSIS — D649 Anemia, unspecified: Secondary | ICD-10-CM | POA: Diagnosis not present

## 2019-10-10 DIAGNOSIS — S72461S Displaced supracondylar fracture with intracondylar extension of lower end of right femur, sequela: Secondary | ICD-10-CM | POA: Diagnosis not present

## 2019-10-10 DIAGNOSIS — I4891 Unspecified atrial fibrillation: Secondary | ICD-10-CM | POA: Diagnosis not present

## 2019-10-10 DIAGNOSIS — I10 Essential (primary) hypertension: Secondary | ICD-10-CM | POA: Diagnosis not present

## 2019-10-12 DIAGNOSIS — I10 Essential (primary) hypertension: Secondary | ICD-10-CM | POA: Diagnosis not present

## 2019-10-12 DIAGNOSIS — N186 End stage renal disease: Secondary | ICD-10-CM | POA: Diagnosis not present

## 2019-10-12 DIAGNOSIS — S72461S Displaced supracondylar fracture with intracondylar extension of lower end of right femur, sequela: Secondary | ICD-10-CM | POA: Diagnosis not present

## 2019-10-12 DIAGNOSIS — F330 Major depressive disorder, recurrent, mild: Secondary | ICD-10-CM | POA: Diagnosis not present

## 2019-10-13 DIAGNOSIS — N2581 Secondary hyperparathyroidism of renal origin: Secondary | ICD-10-CM | POA: Diagnosis not present

## 2019-10-13 DIAGNOSIS — N186 End stage renal disease: Secondary | ICD-10-CM | POA: Diagnosis not present

## 2019-10-13 DIAGNOSIS — D631 Anemia in chronic kidney disease: Secondary | ICD-10-CM | POA: Diagnosis not present

## 2019-10-13 DIAGNOSIS — D509 Iron deficiency anemia, unspecified: Secondary | ICD-10-CM | POA: Diagnosis not present

## 2019-10-13 DIAGNOSIS — Z992 Dependence on renal dialysis: Secondary | ICD-10-CM | POA: Diagnosis not present

## 2019-10-15 DIAGNOSIS — N2581 Secondary hyperparathyroidism of renal origin: Secondary | ICD-10-CM | POA: Diagnosis not present

## 2019-10-15 DIAGNOSIS — Z992 Dependence on renal dialysis: Secondary | ICD-10-CM | POA: Diagnosis not present

## 2019-10-15 DIAGNOSIS — D631 Anemia in chronic kidney disease: Secondary | ICD-10-CM | POA: Diagnosis not present

## 2019-10-15 DIAGNOSIS — D509 Iron deficiency anemia, unspecified: Secondary | ICD-10-CM | POA: Diagnosis not present

## 2019-10-15 DIAGNOSIS — N186 End stage renal disease: Secondary | ICD-10-CM | POA: Diagnosis not present

## 2019-10-19 DIAGNOSIS — N186 End stage renal disease: Secondary | ICD-10-CM | POA: Diagnosis not present

## 2019-10-19 DIAGNOSIS — I10 Essential (primary) hypertension: Secondary | ICD-10-CM | POA: Diagnosis not present

## 2019-10-19 DIAGNOSIS — F330 Major depressive disorder, recurrent, mild: Secondary | ICD-10-CM | POA: Diagnosis not present

## 2019-10-19 DIAGNOSIS — S72461S Displaced supracondylar fracture with intracondylar extension of lower end of right femur, sequela: Secondary | ICD-10-CM | POA: Diagnosis not present

## 2019-10-22 DIAGNOSIS — N186 End stage renal disease: Secondary | ICD-10-CM | POA: Diagnosis not present

## 2019-10-22 DIAGNOSIS — D631 Anemia in chronic kidney disease: Secondary | ICD-10-CM | POA: Diagnosis not present

## 2019-10-22 DIAGNOSIS — D509 Iron deficiency anemia, unspecified: Secondary | ICD-10-CM | POA: Diagnosis not present

## 2019-10-22 DIAGNOSIS — N2581 Secondary hyperparathyroidism of renal origin: Secondary | ICD-10-CM | POA: Diagnosis not present

## 2019-10-22 DIAGNOSIS — Z992 Dependence on renal dialysis: Secondary | ICD-10-CM | POA: Diagnosis not present

## 2019-10-24 DIAGNOSIS — S72461S Displaced supracondylar fracture with intracondylar extension of lower end of right femur, sequela: Secondary | ICD-10-CM | POA: Diagnosis not present

## 2019-10-24 DIAGNOSIS — F330 Major depressive disorder, recurrent, mild: Secondary | ICD-10-CM | POA: Diagnosis not present

## 2019-10-24 DIAGNOSIS — N186 End stage renal disease: Secondary | ICD-10-CM | POA: Diagnosis not present

## 2019-10-24 DIAGNOSIS — S72461D Displaced supracondylar fracture with intracondylar extension of lower end of right femur, subsequent encounter for closed fracture with routine healing: Secondary | ICD-10-CM | POA: Diagnosis not present

## 2019-10-24 DIAGNOSIS — I10 Essential (primary) hypertension: Secondary | ICD-10-CM | POA: Diagnosis not present

## 2019-10-26 DIAGNOSIS — G7109 Other specified muscular dystrophies: Secondary | ICD-10-CM | POA: Diagnosis not present

## 2019-10-26 DIAGNOSIS — I132 Hypertensive heart and chronic kidney disease with heart failure and with stage 5 chronic kidney disease, or end stage renal disease: Secondary | ICD-10-CM | POA: Diagnosis not present

## 2019-10-26 DIAGNOSIS — N186 End stage renal disease: Secondary | ICD-10-CM | POA: Diagnosis not present

## 2019-10-26 DIAGNOSIS — F330 Major depressive disorder, recurrent, mild: Secondary | ICD-10-CM | POA: Diagnosis not present

## 2019-10-26 DIAGNOSIS — I5032 Chronic diastolic (congestive) heart failure: Secondary | ICD-10-CM | POA: Diagnosis not present

## 2019-10-26 DIAGNOSIS — D631 Anemia in chronic kidney disease: Secondary | ICD-10-CM | POA: Diagnosis not present

## 2019-10-26 DIAGNOSIS — Z79891 Long term (current) use of opiate analgesic: Secondary | ICD-10-CM | POA: Diagnosis not present

## 2019-10-26 DIAGNOSIS — G4709 Other insomnia: Secondary | ICD-10-CM | POA: Diagnosis not present

## 2019-10-26 DIAGNOSIS — E559 Vitamin D deficiency, unspecified: Secondary | ICD-10-CM | POA: Diagnosis not present

## 2019-10-26 DIAGNOSIS — I4819 Other persistent atrial fibrillation: Secondary | ICD-10-CM | POA: Diagnosis not present

## 2019-10-26 DIAGNOSIS — Z86711 Personal history of pulmonary embolism: Secondary | ICD-10-CM | POA: Diagnosis not present

## 2019-10-26 DIAGNOSIS — W19XXXD Unspecified fall, subsequent encounter: Secondary | ICD-10-CM | POA: Diagnosis not present

## 2019-10-26 DIAGNOSIS — Z992 Dependence on renal dialysis: Secondary | ICD-10-CM | POA: Diagnosis not present

## 2019-10-26 DIAGNOSIS — Z9181 History of falling: Secondary | ICD-10-CM | POA: Diagnosis not present

## 2019-10-26 DIAGNOSIS — S72461D Displaced supracondylar fracture with intracondylar extension of lower end of right femur, subsequent encounter for closed fracture with routine healing: Secondary | ICD-10-CM | POA: Diagnosis not present

## 2019-10-26 DIAGNOSIS — F17210 Nicotine dependence, cigarettes, uncomplicated: Secondary | ICD-10-CM | POA: Diagnosis not present

## 2019-10-27 ENCOUNTER — Encounter (HOSPITAL_COMMUNITY): Payer: Self-pay | Admitting: Emergency Medicine

## 2019-10-27 ENCOUNTER — Inpatient Hospital Stay (HOSPITAL_COMMUNITY)
Admission: AD | Admit: 2019-10-27 | Discharge: 2019-11-03 | DRG: 208 | Disposition: A | Discharge status: Home Health | Payer: Medicare Other | Attending: Internal Medicine | Admitting: Internal Medicine

## 2019-10-27 ENCOUNTER — Other Ambulatory Visit: Payer: Self-pay

## 2019-10-27 ENCOUNTER — Emergency Department (HOSPITAL_COMMUNITY): Payer: Medicare Other

## 2019-10-27 ENCOUNTER — Inpatient Hospital Stay (HOSPITAL_COMMUNITY): Payer: Medicare Other

## 2019-10-27 DIAGNOSIS — Z992 Dependence on renal dialysis: Secondary | ICD-10-CM | POA: Diagnosis not present

## 2019-10-27 DIAGNOSIS — D631 Anemia in chronic kidney disease: Secondary | ICD-10-CM | POA: Diagnosis present

## 2019-10-27 DIAGNOSIS — I480 Paroxysmal atrial fibrillation: Secondary | ICD-10-CM | POA: Diagnosis present

## 2019-10-27 DIAGNOSIS — Z681 Body mass index (BMI) 19 or less, adult: Secondary | ICD-10-CM

## 2019-10-27 DIAGNOSIS — R627 Adult failure to thrive: Secondary | ICD-10-CM

## 2019-10-27 DIAGNOSIS — Z978 Presence of other specified devices: Secondary | ICD-10-CM

## 2019-10-27 DIAGNOSIS — Z515 Encounter for palliative care: Secondary | ICD-10-CM | POA: Diagnosis not present

## 2019-10-27 DIAGNOSIS — I132 Hypertensive heart and chronic kidney disease with heart failure and with stage 5 chronic kidney disease, or end stage renal disease: Secondary | ICD-10-CM | POA: Diagnosis present

## 2019-10-27 DIAGNOSIS — E8770 Fluid overload, unspecified: Secondary | ICD-10-CM | POA: Diagnosis present

## 2019-10-27 DIAGNOSIS — S72461D Displaced supracondylar fracture with intracondylar extension of lower end of right femur, subsequent encounter for closed fracture with routine healing: Secondary | ICD-10-CM

## 2019-10-27 DIAGNOSIS — Z789 Other specified health status: Secondary | ICD-10-CM | POA: Diagnosis not present

## 2019-10-27 DIAGNOSIS — G71039 Limb girdle muscular dystrophy, unspecified: Secondary | ICD-10-CM | POA: Diagnosis present

## 2019-10-27 DIAGNOSIS — R531 Weakness: Secondary | ICD-10-CM | POA: Diagnosis not present

## 2019-10-27 DIAGNOSIS — R000 Tachycardia, unspecified: Secondary | ICD-10-CM | POA: Diagnosis not present

## 2019-10-27 DIAGNOSIS — J449 Chronic obstructive pulmonary disease, unspecified: Secondary | ICD-10-CM | POA: Diagnosis present

## 2019-10-27 DIAGNOSIS — E8889 Other specified metabolic disorders: Secondary | ICD-10-CM | POA: Diagnosis present

## 2019-10-27 DIAGNOSIS — G7109 Other specified muscular dystrophies: Secondary | ICD-10-CM | POA: Diagnosis present

## 2019-10-27 DIAGNOSIS — Z9911 Dependence on respirator [ventilator] status: Secondary | ICD-10-CM

## 2019-10-27 DIAGNOSIS — E875 Hyperkalemia: Secondary | ICD-10-CM

## 2019-10-27 DIAGNOSIS — I2699 Other pulmonary embolism without acute cor pulmonale: Secondary | ICD-10-CM | POA: Diagnosis present

## 2019-10-27 DIAGNOSIS — R296 Repeated falls: Secondary | ICD-10-CM | POA: Diagnosis present

## 2019-10-27 DIAGNOSIS — J069 Acute upper respiratory infection, unspecified: Secondary | ICD-10-CM

## 2019-10-27 DIAGNOSIS — W19XXXD Unspecified fall, subsequent encounter: Secondary | ICD-10-CM | POA: Diagnosis present

## 2019-10-27 DIAGNOSIS — I471 Supraventricular tachycardia: Secondary | ICD-10-CM | POA: Diagnosis present

## 2019-10-27 DIAGNOSIS — R0902 Hypoxemia: Secondary | ICD-10-CM

## 2019-10-27 DIAGNOSIS — N186 End stage renal disease: Secondary | ICD-10-CM

## 2019-10-27 DIAGNOSIS — F17210 Nicotine dependence, cigarettes, uncomplicated: Secondary | ICD-10-CM | POA: Diagnosis present

## 2019-10-27 DIAGNOSIS — J9600 Acute respiratory failure, unspecified whether with hypoxia or hypercapnia: Secondary | ICD-10-CM

## 2019-10-27 DIAGNOSIS — Z8249 Family history of ischemic heart disease and other diseases of the circulatory system: Secondary | ICD-10-CM

## 2019-10-27 DIAGNOSIS — Z7189 Other specified counseling: Secondary | ICD-10-CM | POA: Diagnosis not present

## 2019-10-27 DIAGNOSIS — I472 Ventricular tachycardia, unspecified: Secondary | ICD-10-CM

## 2019-10-27 DIAGNOSIS — J90 Pleural effusion, not elsewhere classified: Secondary | ICD-10-CM | POA: Diagnosis not present

## 2019-10-27 DIAGNOSIS — I953 Hypotension of hemodialysis: Secondary | ICD-10-CM | POA: Diagnosis not present

## 2019-10-27 DIAGNOSIS — I5031 Acute diastolic (congestive) heart failure: Secondary | ICD-10-CM | POA: Diagnosis not present

## 2019-10-27 DIAGNOSIS — T82898A Other specified complication of vascular prosthetic devices, implants and grafts, initial encounter: Secondary | ICD-10-CM | POA: Diagnosis not present

## 2019-10-27 DIAGNOSIS — Z01818 Encounter for other preprocedural examination: Secondary | ICD-10-CM

## 2019-10-27 DIAGNOSIS — Z95828 Presence of other vascular implants and grafts: Secondary | ICD-10-CM | POA: Diagnosis present

## 2019-10-27 DIAGNOSIS — R64 Cachexia: Secondary | ICD-10-CM | POA: Diagnosis present

## 2019-10-27 DIAGNOSIS — Z79899 Other long term (current) drug therapy: Secondary | ICD-10-CM | POA: Diagnosis not present

## 2019-10-27 DIAGNOSIS — J9601 Acute respiratory failure with hypoxia: Secondary | ICD-10-CM | POA: Diagnosis present

## 2019-10-27 DIAGNOSIS — R931 Abnormal findings on diagnostic imaging of heart and coronary circulation: Secondary | ICD-10-CM | POA: Diagnosis not present

## 2019-10-27 DIAGNOSIS — R0602 Shortness of breath: Secondary | ICD-10-CM | POA: Diagnosis not present

## 2019-10-27 DIAGNOSIS — Z20822 Contact with and (suspected) exposure to covid-19: Secondary | ICD-10-CM | POA: Diagnosis present

## 2019-10-27 DIAGNOSIS — I10 Essential (primary) hypertension: Secondary | ICD-10-CM | POA: Diagnosis present

## 2019-10-27 DIAGNOSIS — Z4659 Encounter for fitting and adjustment of other gastrointestinal appliance and device: Secondary | ICD-10-CM

## 2019-10-27 DIAGNOSIS — I5033 Acute on chronic diastolic (congestive) heart failure: Secondary | ICD-10-CM | POA: Diagnosis present

## 2019-10-27 DIAGNOSIS — D62 Acute posthemorrhagic anemia: Secondary | ICD-10-CM | POA: Diagnosis present

## 2019-10-27 DIAGNOSIS — I482 Chronic atrial fibrillation, unspecified: Secondary | ICD-10-CM | POA: Diagnosis not present

## 2019-10-27 DIAGNOSIS — Z86711 Personal history of pulmonary embolism: Secondary | ICD-10-CM

## 2019-10-27 DIAGNOSIS — R9431 Abnormal electrocardiogram [ECG] [EKG]: Secondary | ICD-10-CM | POA: Diagnosis not present

## 2019-10-27 DIAGNOSIS — E162 Hypoglycemia, unspecified: Secondary | ICD-10-CM | POA: Diagnosis present

## 2019-10-27 HISTORY — DX: Repeated falls: R29.6

## 2019-10-27 HISTORY — DX: Vitamin D deficiency, unspecified: E55.9

## 2019-10-27 HISTORY — DX: Limb girdle muscular dystrophy, unspecified: G71.039

## 2019-10-27 HISTORY — DX: Unspecified fall, initial encounter: W19.XXXA

## 2019-10-27 HISTORY — DX: Other specified muscular dystrophies: G71.09

## 2019-10-27 HISTORY — DX: Anemia in other chronic diseases classified elsewhere: D63.8

## 2019-10-27 HISTORY — DX: Paroxysmal atrial fibrillation: I48.0

## 2019-10-27 LAB — COMPREHENSIVE METABOLIC PANEL
ALT: 7 U/L (ref 0–44)
AST: 16 U/L (ref 15–41)
Albumin: 2.7 g/dL — ABNORMAL LOW (ref 3.5–5.0)
Alkaline Phosphatase: 110 U/L (ref 38–126)
Anion gap: 20 — ABNORMAL HIGH (ref 5–15)
BUN: 80 mg/dL — ABNORMAL HIGH (ref 6–20)
CO2: 22 mmol/L (ref 22–32)
Calcium: 11.2 mg/dL — ABNORMAL HIGH (ref 8.9–10.3)
Chloride: 96 mmol/L — ABNORMAL LOW (ref 98–111)
Creatinine, Ser: 8.87 mg/dL — ABNORMAL HIGH (ref 0.61–1.24)
GFR calc Af Amer: 7 mL/min — ABNORMAL LOW (ref >60)
GFR calc non Af Amer: 6 mL/min — ABNORMAL LOW (ref >60)
Glucose, Bld: 92 mg/dL (ref 70–99)
Potassium: 7.9 mmol/L (ref 3.5–5.1)
Sodium: 138 mmol/L (ref 135–145)
Total Bilirubin: 1.5 mg/dL — ABNORMAL HIGH (ref 0.3–1.2)
Total Protein: 6.6 g/dL (ref 6.5–8.1)

## 2019-10-27 LAB — CBG MONITORING, ED
Glucose-Capillary: 70 mg/dL (ref 70–99)
Glucose-Capillary: 73 mg/dL (ref 70–99)

## 2019-10-27 LAB — I-STAT CHEM 8, ED
BUN: 70 mg/dL — ABNORMAL HIGH (ref 6–20)
Calcium, Ion: 1.32 mmol/L (ref 1.15–1.40)
Chloride: 105 mmol/L (ref 98–111)
Creatinine, Ser: 9.3 mg/dL — ABNORMAL HIGH (ref 0.61–1.24)
Glucose, Bld: 95 mg/dL (ref 70–99)
HCT: 28 % — ABNORMAL LOW (ref 39.0–52.0)
Hemoglobin: 9.5 g/dL — ABNORMAL LOW (ref 13.0–17.0)
Potassium: 7.6 mmol/L (ref 3.5–5.1)
Sodium: 134 mmol/L — ABNORMAL LOW (ref 135–145)
TCO2: 23 mmol/L (ref 22–32)

## 2019-10-27 LAB — POTASSIUM: Potassium: 4.5 mmol/L (ref 3.5–5.1)

## 2019-10-27 LAB — CBC WITH DIFFERENTIAL/PLATELET
Abs Immature Granulocytes: 0.12 10*3/uL — ABNORMAL HIGH (ref 0.00–0.07)
Basophils Absolute: 0.1 10*3/uL (ref 0.0–0.1)
Basophils Relative: 0 %
Eosinophils Absolute: 0 10*3/uL (ref 0.0–0.5)
Eosinophils Relative: 0 %
HCT: 29.3 % — ABNORMAL LOW (ref 39.0–52.0)
Hemoglobin: 9 g/dL — ABNORMAL LOW (ref 13.0–17.0)
Immature Granulocytes: 1 %
Lymphocytes Relative: 4 %
Lymphs Abs: 0.7 10*3/uL (ref 0.7–4.0)
MCH: 31.6 pg (ref 26.0–34.0)
MCHC: 30.7 g/dL (ref 30.0–36.0)
MCV: 102.8 fL — ABNORMAL HIGH (ref 80.0–100.0)
Monocytes Absolute: 0.6 10*3/uL (ref 0.1–1.0)
Monocytes Relative: 3 %
Neutro Abs: 17.2 10*3/uL — ABNORMAL HIGH (ref 1.7–7.7)
Neutrophils Relative %: 92 %
Platelets: 473 10*3/uL — ABNORMAL HIGH (ref 150–400)
RBC: 2.85 MIL/uL — ABNORMAL LOW (ref 4.22–5.81)
RDW: 14.4 % (ref 11.5–15.5)
WBC: 18.7 10*3/uL — ABNORMAL HIGH (ref 4.0–10.5)
nRBC: 0 % (ref 0.0–0.2)

## 2019-10-27 LAB — BRAIN NATRIURETIC PEPTIDE: B Natriuretic Peptide: 635 pg/mL — ABNORMAL HIGH (ref 0.0–100.0)

## 2019-10-27 LAB — POCT I-STAT 7, (LYTES, BLD GAS, ICA,H+H)
Acid-Base Excess: 9 mmol/L — ABNORMAL HIGH (ref 0.0–2.0)
Bicarbonate: 33.6 mmol/L — ABNORMAL HIGH (ref 20.0–28.0)
Calcium, Ion: 1.23 mmol/L (ref 1.15–1.40)
HCT: 24 % — ABNORMAL LOW (ref 39.0–52.0)
Hemoglobin: 8.2 g/dL — ABNORMAL LOW (ref 13.0–17.0)
O2 Saturation: 100 %
Patient temperature: 98.6
Potassium: 3.7 mmol/L (ref 3.5–5.1)
Sodium: 135 mmol/L (ref 135–145)
TCO2: 35 mmol/L — ABNORMAL HIGH (ref 22–32)
pCO2 arterial: 46.6 mmHg (ref 32.0–48.0)
pH, Arterial: 7.467 — ABNORMAL HIGH (ref 7.350–7.450)
pO2, Arterial: 390 mmHg — ABNORMAL HIGH (ref 83.0–108.0)

## 2019-10-27 LAB — TROPONIN I (HIGH SENSITIVITY)
Troponin I (High Sensitivity): 113 ng/L (ref <18)
Troponin I (High Sensitivity): 166 ng/L (ref <18)

## 2019-10-27 LAB — GLUCOSE, CAPILLARY
Glucose-Capillary: 136 mg/dL — ABNORMAL HIGH (ref 70–99)
Glucose-Capillary: 62 mg/dL — ABNORMAL LOW (ref 70–99)
Glucose-Capillary: 77 mg/dL (ref 70–99)

## 2019-10-27 LAB — HIV ANTIBODY (ROUTINE TESTING W REFLEX): HIV Screen 4th Generation wRfx: NONREACTIVE

## 2019-10-27 LAB — SARS CORONAVIRUS 2 BY RT PCR (HOSPITAL ORDER, PERFORMED IN ~~LOC~~ HOSPITAL LAB): SARS Coronavirus 2: NEGATIVE

## 2019-10-27 LAB — BLOOD GAS, ARTERIAL
Acid-Base Excess: 2.2 mmol/L — ABNORMAL HIGH (ref 0.0–2.0)
Bicarbonate: 26.1 mmol/L (ref 20.0–28.0)
FIO2: 80
O2 Saturation: 91.3 %
Patient temperature: 36.6
pCO2 arterial: 46 mmHg (ref 32.0–48.0)
pH, Arterial: 7.383 (ref 7.350–7.450)
pO2, Arterial: 64.8 mmHg — ABNORMAL LOW (ref 83.0–108.0)

## 2019-10-27 LAB — LACTIC ACID, PLASMA
Lactic Acid, Venous: 1.9 mmol/L (ref 0.5–1.9)
Lactic Acid, Venous: 2.3 mmol/L (ref 0.5–1.9)

## 2019-10-27 LAB — APTT: aPTT: 36 seconds (ref 24–36)

## 2019-10-27 LAB — PROTIME-INR
INR: 1 (ref 0.8–1.2)
Prothrombin Time: 12.9 seconds (ref 11.4–15.2)

## 2019-10-27 LAB — ECHOCARDIOGRAM COMPLETE
Height: 74 in
Weight: 2292.78 oz

## 2019-10-27 MED ORDER — ALBUMIN HUMAN 25 % IV SOLN
25.0000 g | Freq: Once | INTRAVENOUS | Status: DC
  Filled 2019-10-27: qty 100

## 2019-10-27 MED ORDER — ALBUMIN HUMAN 25 % IV SOLN
25.0000 g | Freq: Once | INTRAVENOUS | Status: AC
  Administered 2019-10-27: 25 g via INTRAVENOUS

## 2019-10-27 MED ORDER — NOREPINEPHRINE 4 MG/250ML-% IV SOLN
0.0000 ug/min | INTRAVENOUS | Status: DC
  Administered 2019-10-28: 5 ug/min via INTRAVENOUS
  Filled 2019-10-27: qty 250

## 2019-10-27 MED ORDER — CHLORHEXIDINE GLUCONATE 0.12% ORAL RINSE (MEDLINE KIT)
15.0000 mL | Freq: Two times a day (BID) | OROMUCOSAL | Status: DC
  Administered 2019-10-27 – 2019-10-29 (×4): 15 mL via OROMUCOSAL

## 2019-10-27 MED ORDER — DEXTROSE 50 % IV SOLN: 25.0000 mL | Freq: Once | INTRAVENOUS | Status: AC

## 2019-10-27 MED ORDER — INSULIN ASPART 100 UNIT/ML IV SOLN
5.0000 [IU] | Freq: Once | INTRAVENOUS | Status: AC
  Administered 2019-10-27: 5 [IU] via INTRAVENOUS

## 2019-10-27 MED ORDER — HEPARIN SODIUM (PORCINE) 5000 UNIT/ML IJ SOLN
5000.0000 [IU] | Freq: Three times a day (TID) | INTRAMUSCULAR | Status: DC
  Administered 2019-10-27 – 2019-11-01 (×16): 5000 [IU] via SUBCUTANEOUS
  Filled 2019-10-27 (×17): qty 1

## 2019-10-27 MED ORDER — DEXTROSE 50 % IV SOLN
INTRAVENOUS | Status: AC
  Administered 2019-10-27: 25 mL via INTRAVENOUS
  Filled 2019-10-27: qty 50

## 2019-10-27 MED ORDER — POLYETHYLENE GLYCOL 3350 17 G PO PACK: 17.0000 g | PACK | Freq: Every day | ORAL | Status: DC | PRN

## 2019-10-27 MED ORDER — VANCOMYCIN HCL 1250 MG/250ML IV SOLN
1250.0000 mg | Freq: Once | INTRAVENOUS | Status: AC
  Administered 2019-10-27: 1250 mg via INTRAVENOUS
  Filled 2019-10-27: qty 250

## 2019-10-27 MED ORDER — ORAL CARE MOUTH RINSE
15.0000 mL | OROMUCOSAL | Status: DC
  Administered 2019-10-28 – 2019-10-29 (×13): 15 mL via OROMUCOSAL

## 2019-10-27 MED ORDER — CHLORHEXIDINE GLUCONATE CLOTH 2 % EX PADS
6.0000 | MEDICATED_PAD | Freq: Every day | CUTANEOUS | Status: DC
  Administered 2019-10-29 – 2019-11-01 (×4): 6 via TOPICAL

## 2019-10-27 MED ORDER — AMIODARONE HCL 150 MG/3ML IV SOLN: 300.0000 mg | Freq: Once | INTRAVENOUS | Status: DC

## 2019-10-27 MED ORDER — AMIODARONE HCL IN DEXTROSE 360-4.14 MG/200ML-% IV SOLN
60.0000 mg/h | INTRAVENOUS | Status: AC
  Administered 2019-10-27: 60 mg/h via INTRAVENOUS
  Filled 2019-10-27: qty 200

## 2019-10-27 MED ORDER — CALCIUM GLUCONATE 10 % IV SOLN
1.0000 g | Freq: Once | INTRAVENOUS | Status: AC
  Administered 2019-10-27: 1 g via INTRAVENOUS

## 2019-10-27 MED ORDER — VANCOMYCIN VARIABLE DOSE PER UNSTABLE RENAL FUNCTION (PHARMACIST DOSING): Status: DC

## 2019-10-27 MED ORDER — MIDAZOLAM 50MG/50ML (1MG/ML) PREMIX INFUSION
0.5000 mg/h | INTRAVENOUS | Status: DC
  Administered 2019-10-27: 0.5 mg/h via INTRAVENOUS
  Administered 2019-10-28: 6 mg/h via INTRAVENOUS
  Filled 2019-10-27 (×3): qty 50

## 2019-10-27 MED ORDER — ETOMIDATE 2 MG/ML IV SOLN
20.0000 mg | Freq: Once | INTRAVENOUS | Status: AC
  Administered 2019-10-27: 20 mg via INTRAVENOUS

## 2019-10-27 MED ORDER — SODIUM BICARBONATE 8.4 % IV SOLN
50.0000 meq | Freq: Once | INTRAVENOUS | Status: AC
  Administered 2019-10-27: 50 meq via INTRAVENOUS

## 2019-10-27 MED ORDER — IPRATROPIUM-ALBUTEROL 0.5-2.5 (3) MG/3ML IN SOLN
3.0000 mL | Freq: Four times a day (QID) | RESPIRATORY_TRACT | Status: DC
  Administered 2019-10-27 – 2019-10-29 (×9): 3 mL via RESPIRATORY_TRACT
  Filled 2019-10-27 (×10): qty 3

## 2019-10-27 MED ORDER — AMIODARONE HCL 150 MG/3ML IV SOLN
150.0000 mg | Freq: Once | INTRAVENOUS | Status: AC
  Administered 2019-10-27: 150 mg via INTRAVENOUS

## 2019-10-27 MED ORDER — FAMOTIDINE IN NACL 20-0.9 MG/50ML-% IV SOLN: 20.0000 mg | Freq: Two times a day (BID) | INTRAVENOUS | Status: DC

## 2019-10-27 MED ORDER — LIDOCAINE HCL (PF) 1 % IJ SOLN: 5.0000 mL | INTRAMUSCULAR | Status: DC | PRN

## 2019-10-27 MED ORDER — PENTAFLUOROPROP-TETRAFLUOROETH EX AERO: 1.0000 "application " | INHALATION_SPRAY | CUTANEOUS | Status: DC | PRN

## 2019-10-27 MED ORDER — DEXTROSE 10 % IV SOLN: Freq: Once | INTRAVENOUS | Status: AC

## 2019-10-27 MED ORDER — PROPOFOL 1000 MG/100ML IV EMUL
5.0000 ug/kg/min | INTRAVENOUS | Status: DC
  Administered 2019-10-27: 5 ug/kg/min via INTRAVENOUS
  Filled 2019-10-27: qty 100

## 2019-10-27 MED ORDER — INSULIN ASPART 100 UNIT/ML ~~LOC~~ SOLN
0.0000 [IU] | SUBCUTANEOUS | Status: DC
  Administered 2019-10-28: 1 [IU] via SUBCUTANEOUS

## 2019-10-27 MED ORDER — DEXTROSE 50 % IV SOLN
1.0000 | Freq: Once | INTRAVENOUS | Status: AC
  Administered 2019-10-27: 50 mL via INTRAVENOUS
  Filled 2019-10-27: qty 50

## 2019-10-27 MED ORDER — VANCOMYCIN HCL 750 MG/150ML IV SOLN
750.0000 mg | Freq: Once | INTRAVENOUS | Status: AC
  Administered 2019-10-27: 750 mg via INTRAVENOUS
  Filled 2019-10-27: qty 150

## 2019-10-27 MED ORDER — METRONIDAZOLE IN NACL 5-0.79 MG/ML-% IV SOLN
500.0000 mg | Freq: Once | INTRAVENOUS | Status: AC
  Administered 2019-10-27: 500 mg via INTRAVENOUS
  Filled 2019-10-27: qty 100

## 2019-10-27 MED ORDER — SODIUM CHLORIDE 0.9 % IV SOLN
2.0000 g | Freq: Once | INTRAVENOUS | Status: AC
  Administered 2019-10-27: 2 g via INTRAVENOUS
  Filled 2019-10-27: qty 2

## 2019-10-27 MED ORDER — SODIUM CHLORIDE 0.9 % IV SOLN: 100.0000 mL | INTRAVENOUS | Status: DC | PRN

## 2019-10-27 MED ORDER — ONDANSETRON HCL 4 MG/2ML IJ SOLN: 4.0000 mg | Freq: Four times a day (QID) | INTRAMUSCULAR | Status: DC | PRN

## 2019-10-27 MED ORDER — ROCURONIUM BROMIDE 50 MG/5ML IV SOLN
80.0000 mg | Freq: Once | INTRAVENOUS | Status: AC
  Administered 2019-10-27: 80 mg via INTRAVENOUS

## 2019-10-27 MED ORDER — FAMOTIDINE IN NACL 20-0.9 MG/50ML-% IV SOLN
20.0000 mg | Freq: Every day | INTRAVENOUS | Status: DC
  Administered 2019-10-27 – 2019-10-28 (×2): 20 mg via INTRAVENOUS
  Filled 2019-10-27 (×2): qty 50

## 2019-10-27 MED ORDER — SEVELAMER CARBONATE 2.4 G PO PACK
3.2000 g | PACK | Freq: Three times a day (TID) | ORAL | Status: DC
  Administered 2019-10-28 – 2019-10-29 (×4): 3.2 g via ORAL
  Filled 2019-10-27 (×6): qty 1

## 2019-10-27 MED ORDER — SODIUM CHLORIDE 0.9 % IV SOLN
1.0000 g | Freq: Every day | INTRAVENOUS | Status: DC
  Administered 2019-10-27 – 2019-10-28 (×2): 1 g via INTRAVENOUS
  Filled 2019-10-27 (×3): qty 1

## 2019-10-27 MED ORDER — ACETAMINOPHEN 325 MG PO TABS
650.0000 mg | ORAL_TABLET | ORAL | Status: DC | PRN
  Administered 2019-10-29 – 2019-11-03 (×6): 650 mg via ORAL
  Filled 2019-10-27 (×6): qty 2

## 2019-10-27 MED ORDER — AMIODARONE HCL IN DEXTROSE 360-4.14 MG/200ML-% IV SOLN: 60.0000 mg/h | INTRAVENOUS | Status: DC

## 2019-10-27 MED ORDER — VANCOMYCIN HCL IN DEXTROSE 1-5 GM/200ML-% IV SOLN: 1000.0000 mg | Freq: Once | INTRAVENOUS | Status: DC

## 2019-10-27 MED ORDER — HEPARIN SODIUM (PORCINE) 5000 UNIT/ML IJ SOLN: 5000.0000 [IU] | Freq: Three times a day (TID) | INTRAMUSCULAR | Status: DC

## 2019-10-27 MED ORDER — ACETAMINOPHEN 500 MG PO TABS: 1000.0000 mg | ORAL_TABLET | Freq: Four times a day (QID) | ORAL | Status: DC | PRN

## 2019-10-27 MED ORDER — LIDOCAINE-PRILOCAINE 2.5-2.5 % EX CREA
1.0000 "application " | TOPICAL_CREAM | CUTANEOUS | Status: DC | PRN
  Filled 2019-10-27: qty 5

## 2019-10-27 NOTE — ED Notes (Signed)
Called [facility] for new bottle of versed

## 2019-10-27 NOTE — ED Notes (Signed)
CRITICAL VALUE ALERT  Critical Value:  Trop 113, Potassium 7.9  Date & Time Notied:  10/27/2019, 1355  Provider Notified: Dr. Rogene Houston  Orders Received/Actions taken: no new orders

## 2019-10-27 NOTE — ED Notes (Signed)
New bottle of versed with [facility] to hang incase previous bottle runs out

## 2019-10-27 NOTE — H&P (Signed)
 -  Patient transferred to [facility] service please see H&P from Dr. Melvyn Novas -Patient is transferred to [facility] after getting hemodialysis at [facility], ED    --Please seeH&P from Dr. Melvyn Novas for same date of service erroneously labeled as a consult note   Roxan Hockey, MD

## 2019-10-27 NOTE — ED Notes (Signed)
[facility] arrived. 

## 2019-10-27 NOTE — ED Notes (Signed)
Wife updated about patient's condition and placed in the waiting room. EDP to update her asap.

## 2019-10-27 NOTE — Procedures (Signed)
     EMERGENT HEMODIALYSIS TREATMENT NOTE:   HD performed in ED d/t critical hyperkalemia (7.9) despite temporizing measures.  Pre-treatment, pt was medically sedated on ventilator saturating 95%, and hemodynamically stable on Amiodarone and propofol infusion.  HD was initiated via LUA AVF (15g ante/retrograde) on 0K bath for 30 minutes.  Of note, left arm is edematous with 1+ pitting edema.  Per family, some kind of vascular evaluation is pending.    SBP dropped to 80s within 15 minutes of HD initiation with 3L goal.  UF was interrupted and Albumin 25g was given.  Additionally, propofol infusion was weaned and ultimately discontinued in favor of Versed drip.  BP improved after 15 minute break from filtration and UF was resumed at a lower rate.  Stat K was drawn 30 minutes into treatment at which time bath was changed to 2K/2.5Ca.  K resulted at 4.5 and pt remained on 2K bath for the duration of the treatment.  4 hour heparin-free uneventful treatment was completed.  Net UF 2.2 liters.  All blood was returned and hemostasis was achieved in 20 minutes.  Unable to obtain pre/post weights as stretcher did not have a scale.   Rockwell Alexandria, RN   Rockwell Alexandria, RN

## 2019-10-27 NOTE — Progress Notes (Signed)
Patient intubated by ED physician. 7.5 et tube secured at 23 @ lip.  Equal bilateral breath sounds.  Positive CO2 color change.  Pt placed on vent on PRVC 600/18/100%/+5.  Pt tolerating at this time.  RT will continue to monitor.

## 2019-10-27 NOTE — Consult Note (Signed)
NAME:  Steven Sellers, MRN:  [id number], DOB:  December 28, 1960, LOS: 0 ADMISSION DATE:  10/27/2019, CONSULTATION DATE:  10/27/19  REFERRING MD:  EDP , CHIEF COMPLAINT:  resp distress/ acute hyperkalemia   Brief History   59 yo white male smoker esrf/ HD dep missed HD 10/22/19 and 10/25/19  and presented am 6/17 [facility] with sob and ventricular arrthmias and found to have severe hyperkalemia rx with CaCl2, bicarb, insulin and Dextrose and plans to start HD at 230 pm and PCCM service consulted re acute management and ? Need for transfer to [facility] with concern may need continuous HD thru the night not available at [facility]  History of present illness   None directly from pt. 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 fib not on anticoagulation, hypertension admitted May 22 with right femur fracture after mechanical fall at the dialysis center.  Hospital course:  Right distal femur fracture Patient presented following mechanical fall at hemodialysis. Imaging notable for distal right femur fracture. Patient underwent ORIF on 10/03/2019 by Dr. Doreatha Martin.  Patient is touchdown weightbearing status right lower extremity.  Continue pain control with Tylenol, Robaxin, oxycodone as needed.  Continue postoperative DVT prophylaxis with heparin every 8 hours x 21 days.  Will need 2-week follow-up with orthopedics, Dr. Doreatha Martin for repeat x-rays and suture removal.  Discharging to [facility].  A. fib with RVR Following surgical invention, patient developed A. fib with RVR in PACU, initially requiring Cardizem drip which was transitioned back tooralCardizem.  Continue Cardizem CD 240 mg p.o. daily.  Not on chronic anticoagulation due to recurrent falls  and patient declines due to ecchymosis.  ESRD on HD TTS Patient follows with outpatient nephrology and dialysis in [place].  Chronic diastolic congestive heart failure, compensated Hx of essential hypertension TTE 2017 with EF 24-09% with diastolic dysfunction. Volume managed with HD. Continue Cardizem.  Continue HD per nephrology  Limb-girdle muscular dystrophy/ baseline still walking with cane prior to fx Appears to be at baseline.  Continue therapy at rehab.  Tobacco use disorder Counseled on need for cessation  Anemia of chronic disease with likely component of postoperative blood loss anemia Hemoglobin stable, 7.6 at time of discharge. Continue Aranesp weekly with HD  Vitamin D deficiency drisdol 50,000 units PO q7 days and vitamin D3 2000 units p.o. daily  Discharge Diagnoses:  Principal Problem:   Closed displaced supracondylar fracture of distal end of right femur with intracondylar extension ([place]) Active Problems:   Essential hypertension   Muscle atrophy   Limb-girdle muscular dystrophy ([place])   Anemia   ESRD needing dialysis (HCC)   Chronic a-fib ----Declined Anti-coagulation due to Frequent Bruising   Atrial fibrillation with RVR ([place])    Past Medical History   MD/ limb-girdle complicated by falls > fx R femur  ORIF on 10/03/2019 by Dr. Doreatha Martin. ESRF CAF HBP  Significant Hospital Events   Admit to [facility] ER awaiting disposition Placed on amio drip pm 6/17 due to VT with high k  Consults:  PCCM  Procedures:  Oral ET  6/17  L  Fem CVL by edp 6/17   Significant Diagnostic Tests:    Micro Data:  BC x 2 6/17 >>> Covid 19 PCR  6/17 neg   Antimicrobials:  cefemime  6/17  >>> Vancomycin 6/17 >>> Metronidazole 6/17  >>>   Scheduled Meds: Continuous Infusions: . amiodarone 60 mg/hr (10/27/19 1250)  . amiodarone    . metronidazole 500 mg (10/27/19 1504)  . propofol (DIPRIVAN) infusion 40 mcg/kg/min (10/27/19 1510)  . vancomycin 1,250 mg  (10/27/19 1512)   PRN Meds:.   Interim history/subjective:  Just starting HD on my arrival around 230 pm  Sedated on diprovan drip   Objective   Blood pressure (!) 89/63, pulse (!) 101, temperature 97.7 F (36.5 C), temperature source Oral, resp. rate 18, height 6\' 2"  (1.88 m), weight 65 kg, SpO2 100 %.    Vent Mode: PRVC FiO2 (%):  [100 %] 100 % Set Rate:  [18 bmp] 18 bmp Vt Set:  [600 mL] 600 mL PEEP:  [5 cmH20] 5 cmH20 Plateau Pressure:  [19 cmH20] 19 cmH20  No intake or output data in the 24 hours ending 10/27/19 1524 Filed Weights   10/27/19 1058  Weight: 65 kg    Examination: Tmax 97.7  General: chronically ill appearing male sedated on vent  HENT: oral et Lungs: distant bs bilaterally Cardiovascular: RRR no s3  - sinus tach on monitor  Abdomen: soft benign Extremities: L groin CVL, cool ext Neuro: sedated on diprovan     I personally reviewed images and agree with radiology impression as follows:  CXR:   6/17  ET a bit high, no obvious chf/ ptx   Resolved Hospital Problem list     Assessment & Plan:  1) acute resp failure, vent dep sp vtach settled down on Vent prior to HD  - ET a bit high but mechanics are good despite smoking hx - ABx not needed from pulmonary perspective   2) ESRF / HD dep and now severe hyperkalemia p missed HD   3) probably anemia of chronic dz   4) V tach likely due to hyperkalemia, r/o MI  - amiodarone drip started by EDP seems to have quieted the arrythmias and hemodynamics ok off pressors     If improves p emergency HD in ER can consider admit unless more of a continuous HD  required, in meantime check abg's on present settings to assure resp compensation is adequate for whatever degree of acidosis is ongoing    Best practice:  Diet: npo Pain/Anxiety/Delirium protocol (if indicated): diprovan drip VAP protocol (if indicated):   DVT prophylaxis: sub q hep GI prophylaxis  H2  Glucose control: n/a  Mobility: sbr  Code  Status: full code Family Communication: discussed with wife/ daughter at bedside   Labs   CBC: Recent Labs  Lab 10/27/19 1224 10/27/19 1227  WBC 18.7*  --   NEUTROABS 17.2*  --   HGB 9.0* 9.5*  HCT 29.3* 28.0*  MCV 102.8*  --   PLT 473*  --     Basic Metabolic Panel: Recent Labs  Lab 10/27/19 1224 10/27/19 1227  NA 138 134*  K 7.9* 7.6*  CL 96* 105  CO2 22  --   GLUCOSE 92 95  BUN 80* 70*  CREATININE 8.87* 9.30*  CALCIUM 11.2*  --    GFR: Estimated Creatinine Clearance: 7.9 mL/min (A) (by C-G formula based on SCr of 9.3 mg/dL (H)). Recent Labs  Lab 10/27/19 1224 10/27/19 1310  WBC 18.7*  --   LATICACIDVEN  --  1.9    Liver Function Tests: Recent Labs  Lab 10/27/19 1224  AST 16  ALT 7  ALKPHOS 110  BILITOT 1.5*  PROT  6.6  ALBUMIN 2.7*   No results for input(s): LIPASE, AMYLASE in the last 168 hours. No results for input(s): AMMONIA in the last 168 hours.  ABG    Component Value Date/Time   PHART 7.390 04/12/2010 0853   PCO2ART 43.1 04/12/2010 0853   PO2ART 80.0 04/12/2010 0853   HCO3 26.1 (H) 04/12/2010 0853   TCO2 23 10/27/2019 1227   ACIDBASEDEF 4.0 (H) 04/11/2010 2023   O2SAT 96.0 04/12/2010 0853     Coagulation Profile: Recent Labs  Lab 10/27/19 1310  INR 1.0    Cardiac Enzymes: No results for input(s): CKTOTAL, CKMB, CKMBINDEX, TROPONINI in the last 168 hours.  HbA1C: Hgb A1c MFr Bld  Date/Time Value Ref Range Status  09/25/2016 11:48 AM 5.4 4.8 - 5.6 % Final    Comment:    (NOTE)         Pre-diabetes: 5.7 - 6.4         Diabetes: >6.4         Glycemic control for adults with diabetes: <7.0   12/01/2015 05:00 AM 5.3 4.8 - 5.6 % Final    Comment:    (NOTE)         Pre-diabetes: 5.7 - 6.4         Diabetes: >6.4         Glycemic control for adults with diabetes: <7.0     CBG: Recent Labs  Lab 10/27/19 1120  GLUCAP 70       Past Medical History  He,  has a past medical history of Alcohol use, Anemia, ESRD (end  stage renal disease) ([place]), Hypertension, Muscular dystrophy ([place]), Pulmonary embolism ([place]) (2011), Seizures ([place]) (09/2016), Tobacco abuse, and Vision abnormalities.   Surgical History    Past Surgical History:  Procedure Laterality Date  . A/V FISTULAGRAM Left 11/05/2016   Procedure: A/V Fistulagram;  Surgeon: Waynetta Sandy, MD;  Location: [place] [facility];  Service: Cardiovascular;  Laterality: Left;  . A/V FISTULAGRAM Left 01/18/2018   Procedure: A/V FISTULAGRAM - left upper extremity;  Surgeon: Waynetta Sandy, MD;  Location: [place] [facility];  Service: Cardiovascular;  Laterality: Left;  . AV FISTULA PLACEMENT Left 12/26/2015   Procedure: CREATION OF LEFT RADIAL-CEPHALIC ARTERIOVENOUS FISTULA FOR HEMODIALYSIS;  Surgeon: Vickie Epley, MD;  Location: [facility];  Service: Vascular;  Laterality: Left;  . AV FISTULA PLACEMENT Left 04/28/2016   Procedure: REVISION OF LEFT RADIAL CEPHALIC ARTERIOVENOUS (AV) FISTULA  FOR DURABLE HEMODIALYSIS;  Surgeon: Vickie Epley, MD;  Location: [facility];  Service: Vascular;  Laterality: Left;  . CARDIAC SURGERY    . CENTRAL VENOUS CATHETER INSERTION Right 12/04/2015   Procedure: INSERTION OF TUNNELED HEMODIALYSIS CATHETER RIGHT INTERNAL JUGULAR;  Surgeon: Vickie Epley, MD;  Location: [facility];  Service: General;  Laterality: Right;  . IR REMOVAL TUN CV CATH W/O FL  12/10/2016  . ORIF FEMUR FRACTURE Right 10/03/2019   Procedure: OPEN REDUCTION INTERNAL FIXATION (ORIF) DISTAL FEMUR FRACTURE;  Surgeon: Shona Needles, MD;  Location: [place];  Service: Orthopedics;  Laterality: Right;  . PERIPHERAL VASCULAR BALLOON ANGIOPLASTY Left 01/18/2018   Procedure: PERIPHERAL VASCULAR BALLOON ANGIOPLASTY;  Surgeon: Waynetta Sandy, MD;  Location: [place] [facility];  Service: Cardiovascular;  Laterality: Left;  upper arm fistula  . REVISON OF ARTERIOVENOUS FISTULA Left 07/07/2016   Procedure: CREATION OF LEFT ARM BRACHIOCEPHALIC FISTULA;   Surgeon: Waynetta Sandy, MD;  Location: [place];  Service: Vascular;  Laterality: Left;  Social History   reports that he has been smoking cigarettes. He started smoking about 45 years ago. He has a 20.00 pack-year smoking history. He has never used smokeless tobacco. He reports that he does not drink alcohol and does not use drugs.   Family History   His family history includes Heart attack in his father; Hypertension in his mother. There is no history of Cancer or Kidney disease.   Allergies Allergies  Allergen Reactions  . Other Other (See Comments)    IV contrast- Renal issues     Home Medications  Prior to Admission medications   Medication Sig Start Date End Date Taking? Authorizing Provider  methocarbamol (ROBAXIN) 500 MG tablet Take 1 tablet (500 mg total) by mouth every 6 (six) hours as needed for muscle spasms. 10/05/19  Yes Delray Alt, PA-C  oxyCODONE (OXY IR/ROXICODONE) 5 MG immediate release tablet Take 1 tablet (5 mg total) by mouth every 6 (six) hours as needed for moderate pain or severe pain. 10/07/19  Yes [place] Donnamarie Poag, DO  acetaminophen (TYLENOL) 500 MG tablet Take 1,000 mg by mouth every 6 (six) hours as needed for mild pain or moderate pain. Patient not taking: Reported on 10/27/2019    [provider]  Cholecalciferol (VITAMIN D3) 50 MCG (2000 UT) capsule Take 1 capsule (2,000 Units total) by mouth daily. Patient not taking: Reported on 10/27/2019 10/07/19   [place] Donnamarie Poag, DO  Darbepoetin Alfa (ARANESP) 100 MCG/0.5ML SOSY injection Inject 0.5 mLs (100 mcg total) into the vein every Friday with hemodialysis. Patient not taking: Reported on 10/27/2019 10/07/19   [place] Donnamarie Poag, DO  diltiazem (CARDIZEM CD) 240 MG 24 hr capsule Take 1 capsule (240 mg total) by mouth daily. Patient not taking: Reported on 10/27/2019 10/08/19 11/07/19  [place] Donnamarie Poag, DO  docusate sodium (COLACE) 100 MG capsule Take 1 capsule (100 mg total) by mouth 2 (two) times  daily. Patient not taking: Reported on 10/27/2019 10/07/19 11/06/19  [place] Eric J, DO  heparin 5000 UNIT/ML injection Inject 1 mL (5,000 Units total) into the skin every 8 (eight) hours for 21 days. Patient not taking: Reported on 10/27/2019 10/07/19 10/28/19  [place] Donnamarie Poag, DO  lidocaine-prilocaine (EMLA) cream Apply 1 application topically once.  Patient not taking: Reported on 10/27/2019 01/06/17   [provider]  oxyCODONE-acetaminophen (PERCOCET) 5-325 MG tablet Take 1 tablet by mouth every 4 (four) hours as needed for severe pain. Patient not taking: Reported on 10/27/2019 10/05/19   Delray Alt, PA-C  polyethylene glycol (MIRALAX / GLYCOLAX) 17 g packet Take 17 g by mouth daily as needed for mild constipation. Patient not taking: Reported on 10/27/2019 10/07/19   [place] Donnamarie Poag, DO  sevelamer carbonate (RENVELA) 0.8 g PACK packet Take 3.2 g by mouth 3 (three) times daily with meals.  Patient not taking: Reported on 10/27/2019 01/05/17   [provider]  traZODone (DESYREL) 50 MG tablet Take 1 tablet (50 mg total) by mouth at bedtime as needed for sleep. Patient not taking: Reported on 10/27/2019 10/07/19 11/06/19  [place] Eric J, DO  Vitamin D, Ergocalciferol, (DRISDOL) 1.25 MG (50000 UNIT) CAPS capsule Take 1 capsule (50,000 Units total) by mouth every 7 (seven) days. Patient not taking: Reported on 10/27/2019 10/07/19   [place] Eric J, DO       The patient is critically ill with multiple organ systems failure and requires high complexity decision making for assessment and support, frequent evaluation and titration of therapies, application  of advanced monitoring technologies and extensive interpretation of multiple databases. Critical Care Time devoted to patient care services described in this note is 45 minutes.    Christinia Gully, MD Pulmonary and [place] [phone] After 6:00 PM or weekends, use [facility] [phone]  After 7:00  pm call [place]  [phone]

## 2019-10-27 NOTE — ED Notes (Signed)
ET tube 7.60mm and securred 24 at the lip.

## 2019-10-27 NOTE — ED Triage Notes (Signed)
PT brought in by [facility] today from home for worsening in fluid overload from missing dialysis on tueday and today. PT put on O2 4L n/c for low saturation upon EMS arrival to his home today and coarse crackles auscultated upon arrival. PT states he missed dialysis Tuesday because his lift pad hasn't been delivered yet and dialysis center wouldn't dialysis without lift pad and today because he didn't have transport and [facility] couldn't take him to his 11:00 dialysis appointment today.

## 2019-10-27 NOTE — ED Notes (Signed)
PT moved into room 2 and EDP at bedside at this time. PT noted to go into a Vtach rhythm with a pulse in the 150s. Respiratory paged at this time. PT placed on non-rebreather and Zoll pads applied.

## 2019-10-27 NOTE — ED Notes (Signed)
Heartrate 164 and Vtach. New order for Calcium received.

## 2019-10-27 NOTE — Progress Notes (Signed)
ET tube advanced to 26 @ lip per Dr Melvyn Novas. RN to call for repeat chest xray.  RT will continue to monitor

## 2019-10-27 NOTE — ED Notes (Signed)
PT noted to become SOB at this time and saying repeatedly "I can't breathe" and tossing his head back and forth. O2 sats decreased to 86% on 2L and now increased to 4L. PT repositioned at this time.

## 2019-10-27 NOTE — Progress Notes (Signed)
*  PRELIMINARY RESULTS* Echocardiogram 2D Echocardiogram has been performed.  Steven Sellers 10/27/2019, 5:24 PM

## 2019-10-27 NOTE — ED Provider Notes (Signed)
[facility] Provider Note   CSN: [id number] Arrival date & time: 10/27/19  1040     History Chief Complaint  Patient presents with  . Shortness of Breath    Steven Sellers is a 59 y.o. male.  Patient sent in from dialysis center.  Patient known to me.  Patient had a fall outside dialysis center in May.  Fracturing his femur.  Has had that repaired.  Patient best we can tell last had dialysis on Saturday.  Did not make dialysis on Tuesday with a could have been because he was being discharged from the hospital.  He has been receiving dialysis for several years.  Patient became very anxious at dialysis a never was started.  Said he could not breathe can swallow.  Patient's wife says that these things started last night but she could kind of calm him down.  Currently patient had some diarrhea overnight.  Patient denied any headache or chest pain or abdominal pain.  Just said he could not breathe.  Patient was markedly tachycardic.  Blood pressure was markedly elevated.  Patient's oxygen saturations were in the 80s on room air.        Past Medical History:  Diagnosis Date  . Alcohol use   . Anemia   . ESRD (end stage renal disease) ([place])    Hemodialysis [facility] in [place]  . Hypertension   . 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 Pulmonary embolism ([place]) 2011  . Seizures ([place]) 09/2016  . Tobacco abuse   . Vision abnormalities     Patient Active Problem List   Diagnosis Date Noted  . Ventricular tachyarrhythmia ([place]) 10/27/2019  . Acute respiratory failure with hypoxemia ([place]) 10/27/2019  . Atrial fibrillation with RVR ([place]) 10/03/2019  . Closed fracture of right distal femur ([place]) 10/02/2019  . Closed displaced supracondylar fracture of distal end of right femur with intracondylar extension ([place]) 10/01/2019  . Chronic a-fib ----Declined Anti-coagulation due to Frequent Bruising 10/01/2019  . H/o Pulmonary embolism -2012 10/01/2019  . Atrial  fibrillation, new onset ([place]) 02/26/2017  . First time seizure ([place])   . Left arm weakness 09/25/2016  . CHF (congestive heart failure) ([place]) 12/16/2015  . CKD (chronic kidney disease) requiring chronic dialysis ([place]) 12/16/2015  . SOB (shortness of breath) 12/16/2015  . Central venous catheter in place   . Hyponatremia 11/30/2015  . Anemia 11/30/2015  . ESRD needing dialysis ([place]) 11/30/2015  . Acute renal failure (ARF) ([place]) 11/30/2015  . Hyperkalemia 05/12/2015  . 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 Muscle atrophy 04/25/2015  . Arm weakness 04/25/2015  . Leg weakness 04/25/2015  . Dysesthesia 04/25/2015  . Essential hypertension 05/21/2010  . H/o PULMONARY EMBOLISM in 2012 05/21/2010  . PLEURAL EFFUSION 05/21/2010  . RENAL FAILURE, ACUTE 05/21/2010    Past Surgical History:  Procedure Laterality Date  . A/V FISTULAGRAM Left 11/05/2016   Procedure: A/V Fistulagram;  Surgeon: Waynetta Sandy, MD;  Location: [facility];  Service: Cardiovascular;  Laterality: Left;  . A/V FISTULAGRAM Left 01/18/2018   Procedure: A/V FISTULAGRAM - left upper extremity;  Surgeon: Waynetta Sandy, MD;  Location: [facility];  Service: Cardiovascular;  Laterality: Left;  . AV FISTULA PLACEMENT Left 12/26/2015   Procedure: CREATION OF LEFT RADIAL-CEPHALIC ARTERIOVENOUS FISTULA FOR HEMODIALYSIS;  Surgeon: Vickie Epley, MD;  Location: [facility];  Service: Vascular;  Laterality: Left;  . AV FISTULA PLACEMENT Left 04/28/2016  Procedure: REVISION OF LEFT RADIAL CEPHALIC ARTERIOVENOUS (AV) FISTULA  FOR DURABLE HEMODIALYSIS;  Surgeon: Vickie Epley, MD;  Location: [facility];  Service: Vascular;  Laterality: Left;  . CARDIAC SURGERY    . CENTRAL VENOUS CATHETER INSERTION Right 12/04/2015   Procedure: INSERTION OF TUNNELED HEMODIALYSIS CATHETER RIGHT INTERNAL JUGULAR;  Surgeon: Vickie Epley, MD;  Location: [facility];  Service: General;  Laterality: Right;  . IR  REMOVAL TUN CV CATH W/O FL  12/10/2016  . ORIF FEMUR FRACTURE Right 10/03/2019   Procedure: OPEN REDUCTION INTERNAL FIXATION (ORIF) DISTAL FEMUR FRACTURE;  Surgeon: Shona Needles, MD;  Location: [facility];  Service: Orthopedics;  Laterality: Right;  . PERIPHERAL VASCULAR BALLOON ANGIOPLASTY Left 01/18/2018   Procedure: PERIPHERAL VASCULAR BALLOON ANGIOPLASTY;  Surgeon: Waynetta Sandy, MD;  Location: [facility];  Service: Cardiovascular;  Laterality: Left;  upper arm fistula  . REVISON OF ARTERIOVENOUS FISTULA Left 07/07/2016   Procedure: CREATION OF LEFT ARM BRACHIOCEPHALIC FISTULA;  Surgeon: Waynetta Sandy, MD;  Location: [facility];  Service: Vascular;  Laterality: Left;       Family History  Problem Relation Age of Onset  . Hypertension Mother   . Heart attack Father        d/o MI at 81 yo   . Cancer Neg Hx   . Kidney disease Neg Hx     Social History   Tobacco Use  . Smoking status: Current Every Day Smoker    Packs/day: 0.50    Years: 40.00    Pack years: 20.00    Types: Cigarettes    Start date: 05/12/1974  . Smokeless tobacco: Never Used  . Tobacco comment: 1/2 pk per day  Vaping Use  . Vaping Use: Never used  Substance Use Topics  . Alcohol use: No    Alcohol/week: 24.0 standard drinks    Types: 24 Standard drinks or equivalent per week    Comment: occ  . Drug use: No    Home Medications Prior to Admission medications   Medication Sig Start Date End Date Taking? Authorizing Provider  methocarbamol (ROBAXIN) 500 MG tablet Take 1 tablet (500 mg total) by mouth every 6 (six) hours as needed for muscle spasms. 10/05/19  Yes Delray Alt, PA-C  oxyCODONE (OXY IR/ROXICODONE) 5 MG immediate release tablet Take 1 tablet (5 mg total) by mouth every 6 (six) hours as needed for moderate pain or severe pain. 10/07/19  Yes [place] Donnamarie Poag, DO  acetaminophen (TYLENOL) 500 MG tablet Take 1,000 mg by mouth every 6 (six) hours as needed for mild pain or moderate  pain. Patient not taking: Reported on 10/27/2019    [provider]  Cholecalciferol (VITAMIN D3) 50 MCG (2000 UT) capsule Take 1 capsule (2,000 Units total) by mouth daily. Patient not taking: Reported on 10/27/2019 10/07/19   [place] Donnamarie Poag, DO  Darbepoetin Alfa (ARANESP) 100 MCG/0.5ML SOSY injection Inject 0.5 mLs (100 mcg total) into the vein every Friday with hemodialysis. Patient not taking: Reported on 10/27/2019 10/07/19   [place] Donnamarie Poag, DO  diltiazem (CARDIZEM CD) 240 MG 24 hr capsule Take 1 capsule (240 mg total) by mouth daily. Patient not taking: Reported on 10/27/2019 10/08/19 11/07/19  [place] Donnamarie Poag, DO  docusate sodium (COLACE) 100 MG capsule Take 1 capsule (100 mg total) by mouth 2 (two) times daily. Patient not taking: Reported on 10/27/2019 10/07/19 11/06/19  [place] Eric J, DO  heparin 5000 UNIT/ML injection Inject 1 mL (5,000 Units  total) into the skin every 8 (eight) hours for 21 days. Patient not taking: Reported on 10/27/2019 10/07/19 10/28/19  [place] Donnamarie Poag, DO  lidocaine-prilocaine (EMLA) cream Apply 1 application topically once.  Patient not taking: Reported on 10/27/2019 01/06/17   [provider]  oxyCODONE-acetaminophen (PERCOCET) 5-325 MG tablet Take 1 tablet by mouth every 4 (four) hours as needed for severe pain. Patient not taking: Reported on 10/27/2019 10/05/19   Delray Alt, PA-C  polyethylene glycol (MIRALAX / GLYCOLAX) 17 g packet Take 17 g by mouth daily as needed for mild constipation. Patient not taking: Reported on 10/27/2019 10/07/19   [place] Donnamarie Poag, DO  sevelamer carbonate (RENVELA) 0.8 g PACK packet Take 3.2 g by mouth 3 (three) times daily with meals.  Patient not taking: Reported on 10/27/2019 01/05/17   [provider]  traZODone (DESYREL) 50 MG tablet Take 1 tablet (50 mg total) by mouth at bedtime as needed for sleep. Patient not taking: Reported on 10/27/2019 10/07/19 11/06/19  [place] Eric J, DO  Vitamin D, Ergocalciferol,  (DRISDOL) 1.25 MG (50000 UNIT) CAPS capsule Take 1 capsule (50,000 Units total) by mouth every 7 (seven) days. Patient not taking: Reported on 10/27/2019 10/07/19   [place] Eric J, DO    Allergies    Other  Review of Systems   Review of Systems  Constitutional: Negative for chills and fever.  HENT: Negative for rhinorrhea and sore throat.   Eyes: Negative for visual disturbance.  Respiratory: Positive for shortness of breath. Negative for cough.   Cardiovascular: Negative for chest pain and leg swelling.  Gastrointestinal: Negative for abdominal pain, diarrhea, nausea and vomiting.  Genitourinary: Negative for dysuria.  Musculoskeletal: Negative for back pain and neck pain.  Skin: Negative for rash.  Neurological: Negative for dizziness, light-headedness and headaches.  Hematological: Does not bruise/bleed easily.  Psychiatric/Behavioral: Negative for confusion. The patient is nervous/anxious.     Physical Exam Updated Vital Signs BP (!) 154/84   Pulse (!) 105   Temp 97.9 F (36.6 C) (Axillary)   Resp 15   Ht 1.88 m (6\' 2" )   Wt 65 kg   SpO2 94%   BMI 18.40 kg/m   Physical Exam Vitals and nursing note reviewed.  Constitutional:      General: He is in acute distress.     Appearance: He is well-developed. He is ill-appearing.  HENT:     Head: Normocephalic and atraumatic.     Mouth/Throat:     Comments: Some excess of saliva in the mouth. Eyes:     Extraocular Movements: Extraocular movements intact.     Conjunctiva/sclera: Conjunctivae normal.     Pupils: Pupils are equal, round, and reactive to light.  Cardiovascular:     Rate and Rhythm: Regular rhythm. Tachycardia present.     Heart sounds: No murmur heard.   Pulmonary:     Effort: Respiratory distress present.     Breath sounds: Normal breath sounds. No wheezing.  Abdominal:     Palpations: Abdomen is soft.     Tenderness: There is no abdominal tenderness.  Musculoskeletal:        General: Normal range of  motion.     Cervical back: Normal range of motion and neck supple.     Comments: AV fistula left upper arm.  Patient's femoral pulses bilaterally 2+.  Right lateral femur with well-healed surgical scar no evidence of infection.  Skin:    General: Skin is warm.     Coloration: Skin  is pale.     Comments: Some skin is kind to gray in color.  Patient with an AV fistula left upper arm  Neurological:     Mental Status: He is alert.     Comments: Patient distress.  But awake appeared very anxious.  Moving all 4 extremities.     ED Results / Procedures / Treatments   Labs (all labs ordered are listed, but only abnormal results are displayed) Labs Reviewed  CBC WITH DIFFERENTIAL/PLATELET - Abnormal; Notable for the following components:      Result Value   WBC 18.7 (*)    RBC 2.85 (*)    Hemoglobin 9.0 (*)    HCT 29.3 (*)    MCV 102.8 (*)    Platelets 473 (*)    Neutro Abs 17.2 (*)    Abs Immature Granulocytes 0.12 (*)    All other components within normal limits  COMPREHENSIVE METABOLIC PANEL - Abnormal; Notable for the following components:   Potassium 7.9 (*)    Chloride 96 (*)    BUN 80 (*)    Creatinine, Ser 8.87 (*)    Calcium 11.2 (*)    Albumin 2.7 (*)    Total Bilirubin 1.5 (*)    GFR calc non Af Amer 6 (*)    GFR calc Af Amer 7 (*)    Anion gap 20 (*)    All other components within normal limits  BRAIN NATRIURETIC PEPTIDE - Abnormal; Notable for the following components:   B Natriuretic Peptide 635.0 (*)    All other components within normal limits  BLOOD GAS, ARTERIAL - Abnormal; Notable for the following components:   pO2, Arterial 64.8 (*)    Acid-Base Excess 2.2 (*)    All other components within normal limits  I-STAT CHEM 8, ED - Abnormal; Notable for the following components:   Sodium 134 (*)    Potassium 7.6 (*)    BUN 70 (*)    Creatinine, Ser 9.30 (*)    Hemoglobin 9.5 (*)    HCT 28.0 (*)    All other components within normal limits  TROPONIN I (HIGH  SENSITIVITY) - Abnormal; Notable for the following components:   Troponin I (High Sensitivity) 113 (*)    All other components within normal limits  TROPONIN I (HIGH SENSITIVITY) - Abnormal; Notable for the following components:   Troponin I (High Sensitivity) 166 (*)    All other components within normal limits  CULTURE, BLOOD (ROUTINE X 2)  SARS CORONAVIRUS 2 BY RT PCR (HOSPITAL ORDER, [facility])  LACTIC ACID, PLASMA  APTT  PROTIME-INR  POTASSIUM  LACTIC ACID, PLASMA  BASIC METABOLIC PANEL  CBG MONITORING, ED  CBG MONITORING, ED    EKG EKG Interpretation  Date/Time:  Thursday October 27 2019 10:57:22 EDT Ventricular Rate:  102 PR Interval:  174 QRS Duration: 166 QT Interval:  406 QTC Calculation: 529 R Axis:   -84 Text Interpretation: Sinus tachycardia Left axis deviation Right bundle branch block Septal infarct , age undetermined Abnormal ECG ? hyperkalemia Confirmed by Fredia Sorrow [id number]) on 10/27/2019 1:06:18 PM   Radiology DG Chest 1V REPEAT Same Day  Result Date: 10/27/2019 CLINICAL DATA:  59 year old male with loss of breath sounds. EXAM: CHEST - 1 VIEW SAME DAY COMPARISON:  1323 hours today and earlier. FINDINGS: Portable AP upright view at 1435 hours. Enteric tube removed. Endotracheal tube tip is stable just above the clavicles. Stable pacer/resuscitation pads. Evidence of left lower lobe  atelectasis with confluent stable retrocardiac opacity. No pneumothorax, pulmonary edema or definite pleural effusion. Ventilation appears unchanged from earlier. No confluent opacity on the right. No acute osseous abnormality identified. IMPRESSION: 1. Enteric tube removed. Stable ET tube which could be advanced 1 cm for more optimal placement. 2. Stable ventilation from earlier with left lower lobe collapse suspected. No new cardiopulmonary abnormality. Electronically Signed   By: Genevie Ann M.D.   On: 10/27/2019 14:59   DG Chest Portable 1 View  Result  Date: 10/27/2019 CLINICAL DATA:  Post intubation. EXAM: PORTABLE CHEST 1 VIEW COMPARISON:  10/01/2019 FINDINGS: Interval placement of endotracheal tube with tip 10 cm above the carina. Lungs are adequately inflated with hazy opacification over the left base likely small effusion with atelectasis. Cardiomediastinal silhouette and remainder of the exam is unchanged. IMPRESSION: Left base opacification likely small effusion with atelectasis. Endotracheal tube with tip 10 cm above the carina. Electronically Signed   By: Marin Olp M.D.   On: 10/27/2019 12:38   DG Chest Port 1V same Day  Result Date: 10/27/2019 CLINICAL DATA:  Orogastric tube placement EXAM: PORTABLE CHEST 1 VIEW COMPARISON:  Same-day radiograph FINDINGS: Interval placement of enteric tube which is abnormally positioned following the course of the left mainstem bronchus and terminating in the region of the left lower lobe. Endotracheal tube terminates approximately 7.7 cm superior to the carina. Overlying pacer pads. Stable cardiomediastinal contours. Persistent left basilar opacification. No pneumothorax. IMPRESSION: 1. Interval placement of enteric tube which is abnormally positioned following the course of the left mainstem bronchus and terminating in the region of the left lower lobe. Recommend repositioning. 2. Endotracheal tube terminates approximately 7.7 cm superior to the carina. These results were called by telephone at the time of interpretation on 10/27/2019 at 1:53 pm to provider Fredia Sorrow , who verbally acknowledged these results. Electronically Signed   By: Davina Poke D.O.   On: 10/27/2019 13:55    Procedures Procedure Name: Intubation Date/Time: 10/27/2019 4:18 PM Performed by: Fredia Sorrow, MD Pre-anesthesia Checklist: Patient identified, Patient being monitored, Emergency Drugs available, Timeout performed and Suction available Oxygen Delivery Method: Non-rebreather mask Preoxygenation: Pre-oxygenation with  100% oxygen Induction Type: Rapid sequence Ventilation: Mask ventilation without difficulty Laryngoscope Size: Glidescope and 4 Tube size: 7.5 mm Number of attempts: 1 Placement Confirmation: ETT inserted through vocal cords under direct vision,  CO2 detector and Breath sounds checked- equal and bilateral Secured at: 23 cm Tube secured with: ETT holder    .Central Line  Date/Time: 10/27/2019 4:19 PM Performed by: Fredia Sorrow, MD Authorized by: Fredia Sorrow, MD   Consent:    Consent obtained:  Emergent situation Pre-procedure details:    Hand hygiene: Hand hygiene performed prior to insertion     Sterile barrier technique: All elements of maximal sterile technique followed     Skin preparation:  2% chlorhexidine   Skin preparation agent: Skin preparation agent completely dried prior to procedure   Sedation:    Sedation type:  Moderate (conscious) sedation Procedure details:    Location:  L femoral   Patient position:  Flat   Procedural supplies:  Triple lumen   Catheter size:  7 Fr   Landmarks identified: yes     Ultrasound guidance: no     Number of attempts:  2   Successful placement: yes   Post-procedure details:    Post-procedure:  Dressing applied and line sutured   Assessment:  Blood return through all ports   Patient tolerance of procedure:  Tolerated well, no immediate complications   (including critical care time)  CRITICAL CARE Performed by: Fredia Sorrow Total critical care time: 90 minutes Critical care time was exclusive of separately billable procedures and treating other patients. Critical care was necessary to treat or prevent imminent or life-threatening deterioration. Critical care was time spent personally by me on the following activities: development of treatment plan with patient and/or surrogate as well as nursing, discussions with consultants, evaluation of patient's response to treatment, examination of patient, obtaining history from  patient or surrogate, ordering and performing treatments and interventions, ordering and review of laboratory studies, ordering and review of radiographic studies, pulse oximetry and re-evaluation of patient's condition.   Medications Ordered in ED Medications  propofol (DIPRIVAN) 1000 MG/100ML infusion (0 mcg/kg/min  65 kg Intravenous Stopped 10/27/19 1529)  amiodarone (NEXTERONE PREMIX) 360-4.14 MG/200ML-% (1.8 mg/mL) IV infusion (60 mg/hr Intravenous New Bag/Given 10/27/19 1250)  amiodarone (NEXTERONE PREMIX) 360-4.14 MG/200ML-% (1.8 mg/mL) IV infusion (has no administration in time range)  vancomycin (VANCOREADY) IVPB 1250 mg/250 mL (1,250 mg Intravenous New Bag/Given 10/27/19 1512)  midazolam (VERSED) 50 mg/50 mL (1 mg/mL) premix infusion (10 mg/hr Intravenous Rate/Dose Change 10/27/19 1554)  sodium bicarbonate injection 50 mEq (50 mEq Intravenous Given 10/27/19 1117)  calcium gluconate inj 10% (1 g) URGENT USE ONLY! (1 g Intravenous Given 10/27/19 1116)  sodium bicarbonate injection 50 mEq (50 mEq Intravenous Given 10/27/19 1125)  etomidate (AMIDATE) injection 20 mg (20 mg Intravenous Given 10/27/19 1128)  rocuronium (ZEMURON) injection 80 mg (80 mg Intravenous Given 10/27/19 1129)  calcium gluconate inj 10% (1 g) URGENT USE ONLY! (1 g Intravenous Given 10/27/19 1133)  amiodarone (CORDARONE) injection 150 mg (150 mg Intravenous Given 10/27/19 1148)  insulin aspart (novoLOG) injection 5 Units (5 Units Intravenous Given 10/27/19 1329)    And  dextrose 50 % solution 50 mL (50 mLs Intravenous Given 10/27/19 1330)  dextrose 10 % infusion ( Intravenous New Bag/Given 10/27/19 1350)  calcium gluconate inj 10% (1 g) URGENT USE ONLY! (1 g Intravenous Given 10/27/19 1350)  ceFEPIme (MAXIPIME) 2 g in sodium chloride 0.9 % 100 mL IVPB (0 g Intravenous Stopped 10/27/19 1436)  metroNIDAZOLE (FLAGYL) IVPB 500 mg (500 mg Intravenous New Bag/Given 10/27/19 1504)  albumin human 25 % solution 25 g (0 g Intravenous Stopped  10/27/19 1520)    ED Course  I have reviewed the triage vital signs and the nursing notes.  Pertinent labs & imaging results that were available during my care of the patient were reviewed by me and considered in my medical decision making (see chart for details).    MDM Rules/Calculators/A&P                         Patient definitely in respiratory distress with hypoxia.  So intubation was planned.  Just had a small IV in his right arm.  Skin AV fistula on his left arm.  Patient was markedly tachycardic heart rate around 155 it seem to be a wide complex tachycardia raise concerns for hyperkalemia.  Patient's blood pressure was actually on the high side in the 170s.  Patient required intubation.  Patient was intubated with etomidate and rocuronium without any difficulty.  7.5 tube was placed.  Also while we were prepping for intubation due to the concern for hyperkalemia.  Patient was given calcium chloride and sodium bicarb and IV.  This seemed to narrow his complex down some but he still was tachycardic.  Got  a wider complex later so that was given again.  Following intubation patient's oxygen sats were fine.  Patient started on propofol drip.  However we needed to get better line access and could not get any peripheral IVs.  So a left femoral vein central line was initiated triple-lumen.  Patient was also initiated on amiodarone prior to the a central line placement for the tachycardia.  Which did bring his heart rate down to around the 120s.  Worked even after calcium chloride x2 and sodium bicarbonate bite x2 showed potassium was 7.6.  So most likely had a markedly elevated potassium representing significant hyperkalemia.  So in addition patient was given dextrose and insulin to help control that.  But the ultimate was that he needed dialysis.  Nephrology was contacted and plan is to do dialysis here in the emergency department.  Critical care was contacted they will consult.  They thought  initially patient could be admitted here at [facility].  But hospitalist in consultation with them decided patient would be [facility] to [facility].  Patient also was initiated on amiodarone drip following the initial 150 mg bolus.  Patient was continue the propofol drip.  But then went on to have a hypotensive episode the propofol drip was stopped hypotensive so it occurred while he was getting dialyzed.  And then his blood pressures came up.  Patient was also treated with broad-spectrum antibiotics chest x-ray was negative for any acute findings.  Blood cultures done as well.  Final Clinical Impression(s) / ED Diagnoses Final diagnoses:  Hypoxia  Hyperkalemia  ESRD needing dialysis [facility])  Tachyarrhythmia    Rx / DC Orders ED Discharge Orders    None       Fredia Sorrow, MD 10/27/19 1629

## 2019-10-27 NOTE — ED Notes (Signed)
CRITICAL VALUE ALERT  Critical Value:  Potassium 7.6  Date & Time Notied:  10/27/2019, 1250  Provider Notified: Dr. Rogene [facility]  Orders Received/Actions taken: no new orders

## 2019-10-27 NOTE — Progress Notes (Addendum)
Pharmacy Antibiotic Note  RIKI GEHRING is a 59 y.o. male admitted to [facility] on 10/27/2019 with volume overload and hyperkalemia from missed HD sessions.  Patient is s/p full HD session at [facility] and then transferred to [facility] due to possible need for CRRT.  Pharmacy has been consulted for vancomycin and cefepime dosing for sepsis.  Afebrile, WBC elevated at 18.7.  Patient received vancomycin 1250mg  IV and cefepime 2gm IV right before or at the start of HD session, so will need to redose tonight.  Plan: Vanc 750mg  IV x 1 now Cefepime 1gm IV Q24H Monitor HD schedule/tolerance, clinical progress, vanc level as indicated  Height: 6\' 2"  (188 cm) Weight:  (scale unavailable) IBW/kg (Calculated) : 82.2  Temp (24hrs), Avg:97.9 F (36.6 C), Min:97.7 F (36.5 C), Max:98 F (36.7 C)  Recent Labs  Lab 10/27/19 1224 10/27/19 1227 10/27/19 1310 10/27/19 1415  WBC 18.7*  --   --   --   CREATININE 8.87* 9.30*  --   --   LATICACIDVEN  --   --  1.9 2.3*    Estimated Creatinine Clearance: 7.9 mL/min (A) (by C-G formula based on SCr of 9.3 mg/dL (H)).    Allergies  Allergen Reactions  . Other Other (See Comments)    IV contrast- Renal issues    Vanc 6/17 >> Cefepime 6/17 >>  6/17 covid - negative 6/17 BCx -   Keonte Daubenspeck D. Mina Marble, PharmD, BCPS, [place] 10/27/2019, 10:00 PM

## 2019-10-27 NOTE — Progress Notes (Addendum)
Brief summary - 59 y.o. male with a history of ESRD on HD TTS, hypertension, muscular dystrophy, tobacco abuse, chronic A. fib and diastolic dysfunction CHF presenting with acute hypoxic respiratory failure due to volume overload and missed hemodialysis sessions found to be hyperkalemic and having tachyarrhythmias -Patient transferred to PCCM service please see H&P from Dr. Melvyn Novas -Patient is transferred to [facility] after getting hemodialysis at [facility], ED  A/p 1)Acute Hypoxic Resp Failure-- Patient intubated by ED physician. 7.5 et tube secured at 23 @ lip.  Equal bilateral breath sounds.  Positive CO2 color change.  Pt placed on vent on PRVC 600/18/100%/+5 --Discussed with Dr. Melvyn Novas (PCCM) , transferring to [facility] under PCCM service  2) ventricular arrhythmias in the setting of severe hyperkalemia--- patient received hyperkalemia cocktail in the ED, currently receiving hemodialysis to address hyperkalemia  3)Hyperkalemia-potassium is up to 7.9 even after administration of hyperkalemia cocktail --- this is presumed to be due to missed HD sessions, pt missed HD on 10/27/19, then got HD on 10/29/19, missed HD on 10/25/19--- patient typically gets hemodialysis on Tuesdays Thursdays and Saturdays  4)ESRD--- patient has been on hemodialysis at [facility] in [place] for about 4 years now usually Tuesday Thursdays and Saturdays via Lt ARM AVF -Missed hemodialysis sessions as outlined above #3 -Nephrology consult for emergent HD session due to persistent hyperkalemia with arrhythmias Due to ESRD-Preferred narcotic agents for pain control are hydromorphone, fentanyl, and methadone. Morphine should not be used. Baclofen should be avoided  5)Elevated Troponin and Arrhythmia in the setting of volume overload and hyperkalemia-----please get cardiology consult when patient is back to [facility] --echoCardiogram requested - Troponin 113>>>>166  6)HFpEF/acute on chronic diastolic CHF  exacerbation --- echo from 2017 with EF of 55 to 51% with diastolic dysfunction---  -BNP is 635 -Repeat echo as noted in #5 above  7)H/o Prior PE and Chronic atrial fibrillation--- patient and his wife declines anticoagulation  8)Social/Ethics--- plan of care, advanced directives and goals of care discussed with patient and his wife -Patient is currently full scope of treatment and full code -As per wife no limitations to treatment at this time -Given multiple comorbid conditions, severity of illness and recurrent admissions to the hospital I have requested palliative care consult  9)HTN--- May use as needed IV labetalol  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--at baseline patient ambulates with a cane -Pt and his Wife believes muscle weakness contributed to patient's falls  11)Tobacco abuse--patient will need nicotine patch once extubated  12) anemia of ESRD--- hemoglobin currently 9.0 - decision on ESA/Procrit per nephrologist  13) recent right hip fracture discharged from SNF on 10/25/2019   --Post intubation patient became more restless requiring additional sedation, enteric tube appears to be in the left lower lobe, had to be removed -ET tube may have to be advanced, will await completion of hemodialysis and enhanced sedation prior to attempting to advance ET tube  --I discussed this case with Dr. Melvyn Novas from PCCM who will take over care of this patient  --Nephrology and cardiology consult as well as palliative care consult will be needed  --DVT and GI prophylaxis as ordered   CRITICAL CARE Performed by: Roxan Hockey   Total critical care time: 45 minutes  Critical care time was exclusive of separately billable procedures and treating other patients.  -vent on PRVC 600/18/100%/+5   Critical care was necessary to treat or prevent imminent or life-threatening deterioration.  Critical care was time spent personally by me on the following activities: development  of  treatment plan with patient and/or surrogate as well as nursing, discussions with consultants, evaluation of patient's response to treatment, examination of patient, obtaining history from patient or surrogate, ordering and performing treatments and interventions, ordering and review of laboratory studies, ordering and review of radiographic studies, pulse oximetry and re-evaluation of patient's condition.  -Patient transferred to PCCM service please see H&P from Dr. Melvyn Novas -Patient is transferred to [facility] after getting hemodialysis at [facility], ED

## 2019-10-27 NOTE — Consult Note (Addendum)
[facility] Renal Consultation Note  Requesting MD: Fredia Sorrow, MD Indication for Consultation:  ESRD with hyperkalemia  Chief complaint: shortness of breath  HPI: Steven Sellers is a 59 y.o. male with a history of ESRD on HD TTS, hypertension, muscular dystrophy, and prior tobacco abuse who presented to the hospital after family noticed him to be in respiratory distress at home.  I spoke with his wife and daughter who were at bedside in the ER.  They called EMS after he experienced difficulty breathing as well as dry heaving.  He also had complaints of diarrhea.  And hard to get comfortable.  He does not normally use any oxygen at home.  He did not have dialysis on Tuesday of this week, so his last treatment was actually on 6/12.  They state that he did not have dialysis on Tuesday because he was transferred home from the [facility].  He had been at the [facility] for rehab after a fracture.  His family states his left arm is swollen and his AVF is working but he was supposed to have been scheduled for a fistulogram soon.  He has been intubated.  He was initiated on amio after tachy to the 150's.  istat labs are all that is currently available.  He received bicarb x 2 amps, 5 units of insulin with dextrose, and calcium in the ER.  He was also given vanc and cefepime.  Per report, there is concern for pneumothorax after NG tube placement.  Seen on HD with 142/89 BP initially and HR 115 then notified per RN BP 84/68.  UF goal decreased and sedation was adjusted with BP 132/89 on my reassessment.    PMHx:   Past Medical History:  Diagnosis Date  . Alcohol use   . Anemia   . ESRD (end stage renal disease) ([place])    Hemodialysis TTHS- [facility] in [place]  . Hypertension   . Muscular dystrophy ([place])    Limb Girdle  . Pulmonary embolism ([place]) 2011  . Seizures ([place]) 09/2016  . Tobacco abuse   . Vision abnormalities     Past Surgical History:  Procedure Laterality Date   . A/V FISTULAGRAM Left 11/05/2016   Procedure: A/V Fistulagram;  Surgeon: Waynetta Sandy, MD;  Location: [place] CV LAB;  Service: Cardiovascular;  Laterality: Left;  . A/V FISTULAGRAM Left 01/18/2018   Procedure: A/V FISTULAGRAM - left upper extremity;  Surgeon: Waynetta Sandy, MD;  Location: [place] CV LAB;  Service: Cardiovascular;  Laterality: Left;  . AV FISTULA PLACEMENT Left 12/26/2015   Procedure: CREATION OF LEFT RADIAL-CEPHALIC ARTERIOVENOUS FISTULA FOR HEMODIALYSIS;  Surgeon: Vickie Epley, MD;  Location: [facility];  Service: Vascular;  Laterality: Left;  . AV FISTULA PLACEMENT Left 04/28/2016   Procedure: REVISION OF LEFT RADIAL CEPHALIC ARTERIOVENOUS (AV) FISTULA  FOR DURABLE HEMODIALYSIS;  Surgeon: Vickie Epley, MD;  Location: [facility];  Service: Vascular;  Laterality: Left;  . CARDIAC SURGERY    . CENTRAL VENOUS CATHETER INSERTION Right 12/04/2015   Procedure: INSERTION OF TUNNELED HEMODIALYSIS CATHETER RIGHT INTERNAL JUGULAR;  Surgeon: Vickie Epley, MD;  Location: [facility];  Service: General;  Laterality: Right;  . IR REMOVAL TUN CV CATH W/O FL  12/10/2016  . ORIF FEMUR FRACTURE Right 10/03/2019   Procedure: OPEN REDUCTION INTERNAL FIXATION (ORIF) DISTAL FEMUR FRACTURE;  Surgeon: Shona Needles, MD;  Location: [place];  Service: Orthopedics;  Laterality: Right;  . PERIPHERAL VASCULAR BALLOON ANGIOPLASTY Left  01/18/2018   Procedure: PERIPHERAL VASCULAR BALLOON ANGIOPLASTY;  Surgeon: Waynetta Sandy, MD;  Location: [place] CV LAB;  Service: Cardiovascular;  Laterality: Left;  upper arm fistula  . REVISON OF ARTERIOVENOUS FISTULA Left 07/07/2016   Procedure: CREATION OF LEFT ARM BRACHIOCEPHALIC FISTULA;  Surgeon: Waynetta Sandy, MD;  Location: [facility] OR;  Service: Vascular;  Laterality: Left;    Family Hx:  Family History  Problem Relation Age of Onset  . Hypertension Mother   . Heart attack Father        d/o MI at 64 yo   . Cancer  Neg Hx   . Kidney disease Neg Hx     Social History:  reports that he has been smoking cigarettes. He started smoking about 45 years ago. He has a 20.00 pack-year smoking history. He has never used smokeless tobacco. He reports that he does not drink alcohol and does not use drugs.  Allergies:  Allergies  Allergen Reactions  . Other Other (See Comments)    IV contrast- Renal issues    Medications: Prior to Admission medications   Medication Sig Start Date End Date Taking? Authorizing Provider  methocarbamol (ROBAXIN) 500 MG tablet Take 1 tablet (500 mg total) by mouth every 6 (six) hours as needed for muscle spasms. 10/05/19  Yes Delray Alt, PA-C  oxyCODONE (OXY IR/ROXICODONE) 5 MG immediate release tablet Take 1 tablet (5 mg total) by mouth every 6 (six) hours as needed for moderate pain or severe pain. 10/07/19  Yes [place] Donnamarie Poag, DO  acetaminophen (TYLENOL) 500 MG tablet Take 1,000 mg by mouth every 6 (six) hours as needed for mild pain or moderate pain. Patient not taking: Reported on 10/27/2019    [provider]  Cholecalciferol (VITAMIN D3) 50 MCG (2000 UT) capsule Take 1 capsule (2,000 Units total) by mouth daily. Patient not taking: Reported on 10/27/2019 10/07/19   [place] Donnamarie Poag, DO  Darbepoetin Alfa (ARANESP) 100 MCG/0.5ML SOSY injection Inject 0.5 mLs (100 mcg total) into the vein every Friday with hemodialysis. Patient not taking: Reported on 10/27/2019 10/07/19   [place] Donnamarie Poag, DO  diltiazem (CARDIZEM CD) 240 MG 24 hr capsule Take 1 capsule (240 mg total) by mouth daily. Patient not taking: Reported on 10/27/2019 10/08/19 11/07/19  [place] Donnamarie Poag, DO  docusate sodium (COLACE) 100 MG capsule Take 1 capsule (100 mg total) by mouth 2 (two) times daily. Patient not taking: Reported on 10/27/2019 10/07/19 11/06/19  [place] Eric J, DO  heparin 5000 UNIT/ML injection Inject 1 mL (5,000 Units total) into the skin every 8 (eight) hours for 21 days. Patient not taking:  Reported on 10/27/2019 10/07/19 10/28/19  [place] Donnamarie Poag, DO  lidocaine-prilocaine (EMLA) cream Apply 1 application topically once.  Patient not taking: Reported on 10/27/2019 01/06/17   [provider]  oxyCODONE-acetaminophen (PERCOCET) 5-325 MG tablet Take 1 tablet by mouth every 4 (four) hours as needed for severe pain. Patient not taking: Reported on 10/27/2019 10/05/19   Delray Alt, PA-C  polyethylene glycol (MIRALAX / GLYCOLAX) 17 g packet Take 17 g by mouth daily as needed for mild constipation. Patient not taking: Reported on 10/27/2019 10/07/19   [place] Donnamarie Poag, DO  sevelamer carbonate (RENVELA) 0.8 g PACK packet Take 3.2 g by mouth 3 (three) times daily with meals.  Patient not taking: Reported on 10/27/2019 01/05/17   [provider]  traZODone (DESYREL) 50 MG tablet Take 1 tablet (50 mg total) by mouth at bedtime as  needed for sleep. Patient not taking: Reported on 10/27/2019 10/07/19 11/06/19  [place] Eric J, DO  Vitamin D, Ergocalciferol, (DRISDOL) 1.25 MG (50000 UNIT) CAPS capsule Take 1 capsule (50,000 Units total) by mouth every 7 (seven) days. Patient not taking: Reported on 10/27/2019 10/07/19   [place] Eric J, DO    I have reviewed the patient's reported prior to admission and current medications.  Labs:  BMP Latest Ref Rng & Units 10/27/2019 10/27/2019 10/06/2019  Glucose 70 - 99 mg/dL 95 92 90  BUN 6 - 20 mg/dL 70(H) 80(H) 33(H)  Creatinine 0.61 - 1.24 mg/dL 9.30(H) 8.87(H) 4.61(H)  Sodium 135 - 145 mmol/L 134(L) 138 135  Potassium 3.5 - 5.1 mmol/L 7.6(HH) 7.9(HH) 4.5  Chloride 98 - 111 mmol/L 105 96(L) 97(L)  CO2 22 - 32 mmol/L - 22 26  Calcium 8.9 - 10.3 mg/dL - 11.2(H) 7.7(L)    ROS:  Unable to obtain secondary to intubated   Physical Exam: Vitals:   10/27/19 1415 10/27/19 1500  BP: (!) 150/98 102/77  Pulse: (!) 117 (!) 113  Resp: 18 18  Temp:    SpO2: 97% 95%     General: adult male in bed intubated HEENT: NCAT Heart: S1S2  tachycardia Lungs: absent on left anteriorly ;reduce on right anteriorly Abdomen: soft/nd Extremities: 1+ edema left > right upper extremity; no pitting lower extremity edema Skin: no rash on extremities exposed  Neuro: on continuous sedation   [facility] [address]  TTS schedule  Left AVF  3 hours and 15 minutes BF 400  DF 800 2K/2.5 Ca EDW 65 kg (left at 64.5 kg on 6/12) epo 2400 units each tx  hectoral 0.5 mcg of hectoral venofer 50 mg weekly on Tuesdays   Assessment/Plan:  # Hyperkalemia - getting HD emergently now. 0k bath for 30 minutes and then transition to 2K bath.  Repeat K level after 30 minutes of the zero K bath and notify MD if over 5.5 on repeat - spoke with HD RN - Repeat BMP 2 hours after HD to assess for rebound hyperkalemia   # Acute hypoxic resp failure  - On mechanical ventilation per pulm  - critical care assessed for possible pneumothorax though felt to be atelectasis per critical care   - optimize volume status with HD   # ESRD on HD  - HD now as above and per TTS schedule  - Assess dialysis needs and hemodynamics daily  # HTN  - UF as tolerated with HD  # Tachycardia - on amio per ER/primary - just stopped per critical care  # Anemia of ESRD  - anticipate need for ESA  # metabolic bone disease  - continue binders when receiving feeds/po.  Hold hectoral with hypercalcemia  Disposition per pulm and primary team - not currently tolerating much UF with HD and we have reduced goal to improve BP with primary goal to manage hyperkalemia.  Per pulm the current plan is to transfer to [facility] after HD  Claudia Desanctis 10/27/2019, 4:01 PM

## 2019-10-27 NOTE — ED Notes (Addendum)
Have called pharmacy for Versed Drip and Albumin

## 2019-10-27 NOTE — ED Notes (Signed)
Dialysis nurse present at bedside at this time. PT to be transferred to [facility] if bed available when dialysis is complete.

## 2019-10-27 NOTE — Progress Notes (Signed)
[facility] PCCM PROGRESS NOTE  S: 59 year old male with ESRD on HD who had missed HD on 6/15. Presented to HD on 6/17 with complaints of dyspnea. He was transported to [facility] ED where sats found to be in the 80s. ED course complicated by VT with pulse. Treated with amio. He ultimately required intubation for respiratory distress. Lab eval significant for K 7.6, BUN 70, WBC 18.7. He underwent emergent HD in [facility] ED. Potassium corrected to the 4 range post HD. He was transported to [facility] MICU for further ICU care.   O: BP 93/60   Pulse (!) 106   Temp 98 F (36.7 C) (Oral)   Resp (!) 21   Ht 6\' 2"  (1.88 m)   Wt 64.8 kg   SpO2 100%   BMI 18.34 kg/m   General:  Middle aged male of normal body habitus Neuro:  Sedated HEENT:  [place]/AT, PERRL, no JVD Cardiovascular:  ST on monitor rate 104.  Lungs:  Diminished on L Abdomen:  Soft, non-tender, non-distended Musculoskeletal:  No acute deformity Skin:  Intact, MMM   A/P:  Acute hypoxemic respiratory failure ESRD on HD TTS VT in setting of hyperkalemia presumably  Sepsis secondary to LLL HCAP  - Full vent support - Wean sedation for hypotension (was on 15 versed upon arrival to [facility]) - Versed infusion to continue for RASS -1.  - Empiric cefepime vancomycin - Amiodarone has been off since before transport.  - Nephology following. - Follow BMP    Georgann Housekeeper, [place] [facility]/Critical Care Pager [phone] or [phone]

## 2019-10-28 ENCOUNTER — Inpatient Hospital Stay (HOSPITAL_COMMUNITY): Payer: Medicare Other

## 2019-10-28 LAB — CBC WITH DIFFERENTIAL/PLATELET
Abs Immature Granulocytes: 0.09 10*3/uL — ABNORMAL HIGH (ref 0.00–0.07)
Basophils Absolute: 0.1 10*3/uL (ref 0.0–0.1)
Basophils Relative: 0 %
Eosinophils Absolute: 0 10*3/uL (ref 0.0–0.5)
Eosinophils Relative: 0 %
HCT: 23.7 % — ABNORMAL LOW (ref 39.0–52.0)
Hemoglobin: 7.2 g/dL — ABNORMAL LOW (ref 13.0–17.0)
Immature Granulocytes: 1 %
Lymphocytes Relative: 5 %
Lymphs Abs: 0.7 10*3/uL (ref 0.7–4.0)
MCH: 31.4 pg (ref 26.0–34.0)
MCHC: 30.4 g/dL (ref 30.0–36.0)
MCV: 103.5 fL — ABNORMAL HIGH (ref 80.0–100.0)
Monocytes Absolute: 1.1 10*3/uL — ABNORMAL HIGH (ref 0.1–1.0)
Monocytes Relative: 8 %
Neutro Abs: 12.1 10*3/uL — ABNORMAL HIGH (ref 1.7–7.7)
Neutrophils Relative %: 86 %
Platelets: 336 10*3/uL (ref 150–400)
RBC: 2.29 MIL/uL — ABNORMAL LOW (ref 4.22–5.81)
RDW: 14.6 % (ref 11.5–15.5)
WBC: 14.1 10*3/uL — ABNORMAL HIGH (ref 4.0–10.5)
nRBC: 0 % (ref 0.0–0.2)

## 2019-10-28 LAB — POCT I-STAT 7, (LYTES, BLD GAS, ICA,H+H)
Acid-Base Excess: 3 mmol/L — ABNORMAL HIGH (ref 0.0–2.0)
Bicarbonate: 26.7 mmol/L (ref 20.0–28.0)
Calcium, Ion: 1.18 mmol/L (ref 1.15–1.40)
HCT: 22 % — ABNORMAL LOW (ref 39.0–52.0)
Hemoglobin: 7.5 g/dL — ABNORMAL LOW (ref 13.0–17.0)
O2 Saturation: 97 %
Patient temperature: 98.6
Potassium: 4.4 mmol/L (ref 3.5–5.1)
Sodium: 135 mmol/L (ref 135–145)
TCO2: 28 mmol/L (ref 22–32)
pCO2 arterial: 33.8 mmHg (ref 32.0–48.0)
pH, Arterial: 7.506 — ABNORMAL HIGH (ref 7.350–7.450)
pO2, Arterial: 81 mmHg — ABNORMAL LOW (ref 83.0–108.0)

## 2019-10-28 LAB — MAGNESIUM
Magnesium: 1.6 mg/dL — ABNORMAL LOW (ref 1.7–2.4)
Magnesium: 1.7 mg/dL (ref 1.7–2.4)
Magnesium: 1.7 mg/dL (ref 1.7–2.4)

## 2019-10-28 LAB — COMPREHENSIVE METABOLIC PANEL
ALT: 6 U/L (ref 0–44)
AST: 11 U/L — ABNORMAL LOW (ref 15–41)
Albumin: 2.2 g/dL — ABNORMAL LOW (ref 3.5–5.0)
Alkaline Phosphatase: 73 U/L (ref 38–126)
Anion gap: 15 (ref 5–15)
BUN: 21 mg/dL — ABNORMAL HIGH (ref 6–20)
CO2: 26 mmol/L (ref 22–32)
Calcium: 8.9 mg/dL (ref 8.9–10.3)
Chloride: 97 mmol/L — ABNORMAL LOW (ref 98–111)
Creatinine, Ser: 4.01 mg/dL — ABNORMAL HIGH (ref 0.61–1.24)
GFR calc Af Amer: 18 mL/min — ABNORMAL LOW (ref >60)
GFR calc non Af Amer: 15 mL/min — ABNORMAL LOW (ref >60)
Glucose, Bld: 99 mg/dL (ref 70–99)
Potassium: 4.5 mmol/L (ref 3.5–5.1)
Sodium: 138 mmol/L (ref 135–145)
Total Bilirubin: 1.3 mg/dL — ABNORMAL HIGH (ref 0.3–1.2)
Total Protein: 5.1 g/dL — ABNORMAL LOW (ref 6.5–8.1)

## 2019-10-28 LAB — GLUCOSE, CAPILLARY
Glucose-Capillary: 119 mg/dL — ABNORMAL HIGH (ref 70–99)
Glucose-Capillary: 122 mg/dL — ABNORMAL HIGH (ref 70–99)
Glucose-Capillary: 130 mg/dL — ABNORMAL HIGH (ref 70–99)
Glucose-Capillary: 40 mg/dL — CL (ref 70–99)
Glucose-Capillary: 68 mg/dL — ABNORMAL LOW (ref 70–99)
Glucose-Capillary: 69 mg/dL — ABNORMAL LOW (ref 70–99)
Glucose-Capillary: 77 mg/dL (ref 70–99)
Glucose-Capillary: 95 mg/dL (ref 70–99)
Glucose-Capillary: 96 mg/dL (ref 70–99)

## 2019-10-28 LAB — PHOSPHORUS
Phosphorus: 5.9 mg/dL — ABNORMAL HIGH (ref 2.5–4.6)
Phosphorus: 5.7 mg/dL — ABNORMAL HIGH (ref 2.5–4.6)

## 2019-10-28 LAB — MRSA PCR SCREENING: MRSA by PCR: POSITIVE — AB

## 2019-10-28 LAB — IRON AND TIBC
Iron: 12 ug/dL — ABNORMAL LOW (ref 45–182)
Saturation Ratios: 9 % — ABNORMAL LOW (ref 17.9–39.5)
TIBC: 134 ug/dL — ABNORMAL LOW (ref 250–450)
UIBC: 122 ug/dL

## 2019-10-28 MED ORDER — ALBUMIN HUMAN 25 % IV SOLN
25.0000 g | Freq: Four times a day (QID) | INTRAVENOUS | Status: AC
  Administered 2019-10-28 – 2019-10-29 (×4): 25 g via INTRAVENOUS
  Filled 2019-10-28 (×4): qty 100

## 2019-10-28 MED ORDER — RENA-VITE PO TABS
1.0000 | ORAL_TABLET | Freq: Every day | ORAL | Status: DC
  Administered 2019-10-28: 1 via ORAL
  Filled 2019-10-28: qty 1

## 2019-10-28 MED ORDER — NOREPINEPHRINE 16 MG/250ML-% IV SOLN
0.0000 ug/min | INTRAVENOUS | Status: DC
  Administered 2019-10-28: 6 ug/min via INTRAVENOUS
  Filled 2019-10-28: qty 250

## 2019-10-28 MED ORDER — FENTANYL CITRATE (PF) 100 MCG/2ML IJ SOLN
INTRAMUSCULAR | Status: AC
  Administered 2019-10-28: 100 ug
  Filled 2019-10-28: qty 2

## 2019-10-28 MED ORDER — RENA-VITE PO TABS
1.0000 | ORAL_TABLET | Freq: Every day | ORAL | Status: DC
  Administered 2019-10-29: 1
  Filled 2019-10-28: qty 1

## 2019-10-28 MED ORDER — MIDAZOLAM HCL 2 MG/2ML IJ SOLN
INTRAMUSCULAR | Status: AC
  Administered 2019-10-28: 2 mg
  Filled 2019-10-28: qty 2

## 2019-10-28 MED ORDER — HEPARIN SODIUM (PORCINE) 1000 UNIT/ML DIALYSIS
1000.0000 [IU] | INTRAMUSCULAR | Status: DC | PRN
  Administered 2019-10-28 (×2): 1400 [IU] via INTRAVENOUS_CENTRAL
  Filled 2019-10-28: qty 6
  Filled 2019-10-28: qty 4
  Filled 2019-10-28 (×2): qty 6

## 2019-10-28 MED ORDER — PRO-STAT SUGAR FREE PO LIQD
30.0000 mL | Freq: Every day | ORAL | Status: DC
  Administered 2019-10-29 – 2019-11-01 (×2): 30 mL
  Filled 2019-10-28 (×3): qty 30

## 2019-10-28 MED ORDER — VITAL 1.5 CAL PO LIQD
1000.0000 mL | ORAL | Status: DC
  Administered 2019-10-28: 1000 mL
  Filled 2019-10-28 (×4): qty 1000

## 2019-10-28 MED ORDER — DEXTROSE 10 % IV SOLN: INTRAVENOUS | Status: DC

## 2019-10-28 MED ORDER — DEXMEDETOMIDINE HCL IN NACL 400 MCG/100ML IV SOLN
0.4000 ug/kg/h | INTRAVENOUS | Status: DC
  Administered 2019-10-28 (×2): 0.4 ug/kg/h via INTRAVENOUS
  Filled 2019-10-28 (×2): qty 100

## 2019-10-28 MED ORDER — MIDODRINE HCL 5 MG PO TABS
5.0000 mg | ORAL_TABLET | Freq: Three times a day (TID) | ORAL | Status: DC
  Administered 2019-10-28 – 2019-10-29 (×3): 5 mg via ORAL
  Filled 2019-10-28 (×3): qty 1

## 2019-10-28 NOTE — Progress Notes (Signed)
Initial Nutrition Assessment  DOCUMENTATION CODES:   Underweight  INTERVENTION:   Tube Feeding via Cortrak:  Vital 1.5 at 55 ml/hr Pro-Stat 30 mL daily Provides 2080 kcals, 104 g of protein and 1003 mL of free water Meets 100% estimated calorie and protein needs  Add Rena-Vite daily  NUTRITION DIAGNOSIS:   Inadequate oral intake related to acute illness as evidenced by NPO status.  GOAL:   Patient will meet greater than or equal to 90% of their needs  MONITOR:   Vent status, Labs, Weight trends, TF tolerance  REASON FOR ASSESSMENT:   Consult, Ventilator Enteral/tube feeding initiation and management  ASSESSMENT:   59 yo male admitted to [facility] with SOB and ventricular arrhythmias and found to have severe hyperkalemia after missed HD x 2. PMH includes recent femur fracture s/p ORIF on 10/03/19, ESRD on HD, HTN, EtOH abuse, muscular dystrophy   RD working remotely.  Noted pt missed HD on 6/12 and 6/15  6/17 Intubated, received iHD 6/18 Cortrak  Patient is currently intubated on ventilator support, sedated on precedex, requiring levophed. Plan for weaning possible extubtaion MV: 10.9 L/min Temp (24hrs), Avg:98.3 F (36.8 C), Min:97.9 F (36.6 C), Max:98.9 F (37.2 C)  Noted episodes of hypoglycemia ovenright, CBGs 68-96  Potassium 7.9 on admission, improved to 4.5 with treatment  Outpatient EDW 65 kg; current weight 64.8 kg Noted pt with 2+ edema in BUE. Suspect true dry weight may be lower than current outpatient EDW indicating possible weight loss.   Unable to obtain diet and weight history from patient at this time  Labs: CBGs 68-96 Meds: ss novolog, renvela   Diet Order:   Diet Order            Diet NPO time specified  Diet effective now                 EDUCATION NEEDS:   Not appropriate for education at this time  Skin:  Skin Assessment: Reviewed RN Assessment  Last BM:  no documented BM  Height:   Ht Readings from Last 1  Encounters:  10/27/19 6\' 2"  (1.88 m)    Weight:   Wt Readings from Last 1 Encounters:  10/27/19 64.8 kg    BMI:  Body mass index is 18.34 kg/m.  Estimated Nutritional Needs:   Kcal:  1950-2150 kcals  Protein:  98-130 g  Fluid:  1000 mL plus UOP   Kerman Passey MS, RDN, LDN, CNSC Registered Dietitian III RD Pager Number and RD On-Call Pager Number Located in [place]

## 2019-10-28 NOTE — Progress Notes (Signed)
[facility] Progress Note Patient Name: Steven Sellers DOB: 07-14-1960 MRN: [id number]   Date of Service  10/28/2019  HPI/Events of Note  Pt needs order for a.m. labs  eICU Interventions   a.m. labs ordered.        [name] 10/28/2019, 4:14 AM

## 2019-10-28 NOTE — Progress Notes (Signed)
[facility] ROUNDING NOTE   Subjective:   59 year old gentleman with history end-stage renal disease Tuesday Thursday Saturday dialysis hypertension muscular dystrophy history of tobacco abuse.  Presents to [facility] with increasing shortness of breath.  His last dialysis treatment was Saturday, 10/22/2019.  He did not receive his dialysis treatment Tuesday, 10/25/2019.  He was found to be hyperkalemic with a potassium of 7.6.  He was emergently dialyzed.  He was also placed on the ventilator 10/27/2019 for his acute hypoxic respiratory failure.  Blood pressure 89/65 pulse 105 temperature 98.7 O2 sats 90% FiO2 "40%  Sodium 138 potassium 4.5 chloride 97 CO2 26 BUN 21 creatinine 4.01 glucose 99 calcium 8.9 albumin 2.2 hemoglobin 7.2 WBC 14.1  Renvela 3.2 g 3 times daily Vancomycin IV cefepime  IV Levophed    Objective:  Vital signs in last 24 hours:  Temp:  [97.7 F (36.5 C)-98.7 F (37.1 C)] 98.7 F (37.1 C) (06/18 0305) Pulse Rate:  [32-159] 105 (06/18 0600) Resp:  [11-33] 18 (06/18 0600) BP: (80-197)/(52-125) 92/64 (06/18 0600) SpO2:  [74 %-100 %] 100 % (06/18 0600) FiO2 (%):  [40 %-100 %] 40 % (06/18 0420) Weight:  [64.8 kg-65 kg] 64.8 kg (06/17 2205)  Weight change:  Filed Weights   10/27/19 1058 10/27/19 2205  Weight: 65 kg 64.8 kg    Intake/Output: I/O last 3 completed shifts: In: 769.7 [I.V.:469.7; IV Piggyback:300] Out: 2216 [Other:2216]   Intake/Output this shift:  No intake/output data recorded.  General: adult male in bed intubated HEENT: NCAT Heart: S1S2 tachycardia Lungs: absent on left anteriorly ;reduce on right anteriorly Abdomen: soft/nd Extremities: 1+ edema left > right upper extremity; no pitting lower extremity edema Skin: no rash on extremities exposed  Neuro: on continuous sedation     Basic Metabolic Panel: Recent Labs  Lab 10/27/19 1224 10/27/19 1224 10/27/19 1227 10/27/19 1515 10/27/19 2139 10/28/19 0421  10/28/19 0429  NA 138  --  134*  --  135 135 138  K 7.9*   < > 7.6* 4.5 3.7 4.4 4.5  CL 96*  --  105  --   --   --  97*  CO2 22  --   --   --   --   --  26  GLUCOSE 92  --  95  --   --   --  99  BUN 80*  --  70*  --   --   --  21*  CREATININE 8.87*  --  9.30*  --   --   --  4.01*  CALCIUM 11.2*  --   --   --   --   --  8.9   < > = values in this interval not displayed.    Liver Function Tests: Recent Labs  Lab 10/27/19 1224 10/28/19 0429  AST 16 11*  ALT 7 6  ALKPHOS 110 73  BILITOT 1.5* 1.3*  PROT 6.6 5.1*  ALBUMIN 2.7* 2.2*   No results for input(s): LIPASE, AMYLASE in the last 168 hours. No results for input(s): AMMONIA in the last 168 hours.  CBC: Recent Labs  Lab 10/27/19 1224 10/27/19 1227 10/27/19 2139 10/28/19 0421 10/28/19 0429  WBC 18.7*  --   --   --  14.1*  NEUTROABS 17.2*  --   --   --  12.1*  HGB 9.0* 9.5* 8.2* 7.5* 7.2*  HCT 29.3* 28.0* 24.0* 22.0* 23.7*  MCV 102.8*  --   --   --  103.5*  PLT 473*  --   --   --  336    Cardiac Enzymes: No results for input(s): CKTOTAL, CKMB, CKMBINDEX, TROPONINI in the last 168 hours.  BNP: Invalid input(s): POCBNP  CBG: Recent Labs  Lab 10/27/19 2153 10/27/19 2348 10/28/19 0157 10/28/19 0237 10/28/19 0427  GLUCAP 136* 77 68* 66* 96    Microbiology: Results for orders placed or performed during the hospital encounter of 10/27/19  SARS Coronavirus 2 by RT PCR (hospital order, performed in [facility] hospital lab) Nasopharyngeal Nasopharyngeal Swab     Status: None   Collection Time: 10/27/19 12:25 PM   Specimen: Nasopharyngeal Swab  Result Value Ref Range Status   SARS Coronavirus 2 NEGATIVE NEGATIVE Final    Comment: (NOTE) SARS-CoV-2 target nucleic acids are NOT DETECTED.  The SARS-CoV-2 RNA is generally detectable in upper and lower respiratory specimens during the acute phase of infection. The lowest concentration of SARS-CoV-2 viral copies this assay can detect is 250 copies / mL. A  negative result does not preclude SARS-CoV-2 infection and should not be used as the sole basis for treatment or other patient management decisions.  A negative result may occur with improper specimen collection / handling, submission of specimen other than nasopharyngeal swab, presence of viral mutation(s) within the areas targeted by this assay, and inadequate number of viral copies (<250 copies / mL). A negative result must be combined with clinical observations, patient history, and epidemiological information.  Fact Sheet for Patients:   StrictlyIdeas.no  Fact Sheet for Healthcare Providers: BankingDealers.co.za  This test is not yet approved or  cleared by the [place] FDA and has been authorized for detection and/or diagnosis of SARS-CoV-2 by FDA under an Emergency Use Authorization (EUA).  This EUA will remain in effect (meaning this test can be used) for the duration of the COVID-19 declaration under Section 564(b)(1) of the Act, 21 U.S.C. section 360bbb-3(b)(1), unless the authorization is terminated or revoked sooner.  Performed at [facility], [address] [place], [place] [address]   Blood culture (routine x 2)     Status: None (Preliminary result)   Collection Time: 10/27/19  1:10 PM   Specimen: BLOOD  Result Value Ref Range Status   Specimen Description BLOOD LEFT FEMORAL CATH  Final   Special Requests   Final    BOTTLES DRAWN AEROBIC AND ANAEROBIC Blood Culture adequate volume   Culture   Final    NO GROWTH < 24 HOURS Performed at [facility], [address] [place], [place] [address]    Report Status PENDING  Incomplete  MRSA PCR Screening     Status: Abnormal   Collection Time: 10/27/19 11:17 PM   Specimen: Nasal Mucosa; Nasopharyngeal  Result Value Ref Range Status   MRSA by PCR POSITIVE (A) NEGATIVE Final    Comment:        The GeneXpert MRSA Assay (FDA approved for NASAL specimens only), is one  component of a comprehensive MRSA colonization surveillance program. It is not intended to diagnose MRSA infection nor to guide or monitor treatment for MRSA infections. RESULT CALLED TO, READ BACK BY AND VERIFIED WITH: J CRUISE RN 10/28/19 0101 JDW Performed at [facility] [address] [place], [place] [address]     Coagulation Studies: Recent Labs    10/27/19 1310  LABPROT 12.9  INR 1.0    Urinalysis: No results for input(s): COLORURINE, LABSPEC, PHURINE, GLUCOSEU, HGBUR, BILIRUBINUR, KETONESUR, PROTEINUR, UROBILINOGEN, NITRITE, LEUKOCYTESUR in the  last 72 hours.  Invalid input(s): APPERANCEUR    Imaging: DG Chest 1V REPEAT Same Day  Result Date: 10/27/2019 CLINICAL DATA:  59 year old male with loss of breath sounds. EXAM: CHEST - 1 VIEW SAME DAY COMPARISON:  1323 hours today and earlier. FINDINGS: Portable AP upright view at 1435 hours. Enteric tube removed. Endotracheal tube tip is stable just above the clavicles. Stable pacer/resuscitation pads. Evidence of left lower lobe atelectasis with confluent stable retrocardiac opacity. No pneumothorax, pulmonary edema or definite pleural effusion. Ventilation appears unchanged from earlier. No confluent opacity on the right. No acute osseous abnormality identified. IMPRESSION: 1. Enteric tube removed. Stable ET tube which could be advanced 1 cm for more optimal placement. 2. Stable ventilation from earlier with left lower lobe collapse suspected. No new cardiopulmonary abnormality. Electronically Signed   By: Genevie Ann M.D.   On: 10/27/2019 14:59   DG CHEST PORT 1 VIEW  Result Date: 10/27/2019 CLINICAL DATA:  Hypoxia EXAM: PORTABLE CHEST 1 VIEW COMPARISON:  10/27/2019, 10/01/2019 FINDINGS: Endotracheal tube tip about 3.6 cm superior to the carina. Esophageal tube tip below the diaphragm but incompletely visualized. Scarring at the right base. Persistent consolidation at the left lung base with possible effusion. Stable  cardiomediastinal silhouette. IMPRESSION: 1. Endotracheal tube tip about 3.6 cm superior to carina 2. Persistent consolidation at the left lung base with possible small effusion Electronically Signed   By: Donavan Foil M.D.   On: 10/27/2019 23:49   DG Chest Port 1 View  Result Date: 10/27/2019 CLINICAL DATA:  Intubated EXAM: PORTABLE CHEST 1 VIEW COMPARISON:  10/27/2019 at 2:35 p.m. FINDINGS: Single frontal view of the chest demonstrates endotracheal tube overlying tracheal air column tip just below thoracic inlet. External defibrillator pads again identified. Cardiac silhouette is stable. Persistent left lower lobe consolidation. Small left effusion not excluded. No pneumothorax. IMPRESSION: 1. Appropriate position of the endotracheal tube. 2. Persistent left lower lobe consolidation. Electronically Signed   By: Randa Ngo M.D.   On: 10/27/2019 18:27   DG Chest Portable 1 View  Result Date: 10/27/2019 CLINICAL DATA:  Post intubation. EXAM: PORTABLE CHEST 1 VIEW COMPARISON:  10/01/2019 FINDINGS: Interval placement of endotracheal tube with tip 10 cm above the carina. Lungs are adequately inflated with hazy opacification over the left base likely small effusion with atelectasis. Cardiomediastinal silhouette and remainder of the exam is unchanged. IMPRESSION: Left base opacification likely small effusion with atelectasis. Endotracheal tube with tip 10 cm above the carina. Electronically Signed   By: Marin Olp M.D.   On: 10/27/2019 12:38   DG Chest Port 1V same Day  Result Date: 10/27/2019 CLINICAL DATA:  Orogastric tube placement EXAM: PORTABLE CHEST 1 VIEW COMPARISON:  Same-day radiograph FINDINGS: Interval placement of enteric tube which is abnormally positioned following the course of the left mainstem bronchus and terminating in the region of the left lower lobe. Endotracheal tube terminates approximately 7.7 cm superior to the carina. Overlying pacer pads. Stable cardiomediastinal contours.  Persistent left basilar opacification. No pneumothorax. IMPRESSION: 1. Interval placement of enteric tube which is abnormally positioned following the course of the left mainstem bronchus and terminating in the region of the left lower lobe. Recommend repositioning. 2. Endotracheal tube terminates approximately 7.7 cm superior to the carina. These results were called by telephone at the time of interpretation on 10/27/2019 at 1:53 pm to provider Fredia Sorrow , who verbally acknowledged these results. Electronically Signed   By: Davina Poke D.O.   On: 10/27/2019 13:55  DG Abd Portable 1V  Result Date: 10/27/2019 CLINICAL DATA:  OG tube EXAM: PORTABLE ABDOMEN - 1 VIEW COMPARISON:  Chest x-ray 10/27/2019 FINDINGS: Esophageal tube tip overlies the proximal stomach, side-port in the region of GE junction. Probable small left effusion with left basilar consolidation. IMPRESSION: Esophageal tube side-port overlies GE junction, further advancement could be considered for more optimal positioning Electronically Signed   By: Donavan Foil M.D.   On: 10/27/2019 23:49   ECHOCARDIOGRAM COMPLETE  Result Date: 10/27/2019    ECHOCARDIOGRAM REPORT   Patient Name:   JESSTIN STUDSTILL Date of Exam: 10/27/2019 Medical Rec #:  [id number]       Height:       74.0 in Accession #:    [id number]      Weight:       143.3 lb Date of Birth:  1961-02-26       BSA:          1.886 m Patient Age:    10 years        BP:           111/74 mmHg Patient Gender: M               HR:           108 bpm. Exam Location:  [facility] Procedure: 2D Echo, Cardiac Doppler and Color Doppler Indications:    CHF-Acute Diastolic 361.22 / E49.75  History:        Patient has prior history of Echocardiogram examinations, most                 recent 09/26/2016. CHF, Arrythmias:Atrial Fibrillation; Risk                 Factors:Hypertension. Chronic a-fib ----Declined                 Anti-coagulation due to Frequent Bruising,ESRD on                  dialysis,Muscular dystrophy ([place]) (From Hx),Tobacco abuse (From                 Hx).  Sonographer:    Alvino Chapel RCS Referring Phys: [phone] [facility]  Sonographer Comments: Patient on mechanical ventilator and dialysis at time of echo. IMPRESSIONS  1. Left ventricular ejection fraction, by estimation, is 60 to 65%. The left ventricle has normal function. The left ventricle has no regional wall motion abnormalities. The left ventricular internal cavity size was mildly dilated. There is severe left ventricular hypertrophy. Left ventricular diastolic parameters were normal.  2. Right ventricular systolic function is normal. The right ventricular size is normal.  3. The mitral valve is normal in structure. No evidence of mitral valve regurgitation. No evidence of mitral stenosis.  4. Nodular calcification near the non coronary cusp cannot r/o vegetation on the ventricular side of valve no significant AR/AS . The aortic valve is abnormal. Aortic valve regurgitation is not visualized. No aortic stenosis is present.  5. The inferior vena cava is normal in size with greater than 50% respiratory variability, suggesting right atrial pressure of 3 mmHg. FINDINGS  Left Ventricle: Left ventricular ejection fraction, by estimation, is 60 to 65%. The left ventricle has normal function. The left ventricle has no regional wall motion abnormalities. The left ventricular internal cavity size was mildly dilated. There is  severe left ventricular hypertrophy. Left ventricular diastolic parameters were normal. Right Ventricle: The right ventricular size is normal. No increase in right  ventricular wall thickness. Right ventricular systolic function is normal. Left Atrium: Left atrial size was normal in size. Right Atrium: Right atrial size was normal in size. Pericardium: There is no evidence of pericardial effusion. Mitral Valve: The mitral valve is normal in structure. There is mild thickening of the mitral valve leaflet(s).  There is mild calcification of the mitral valve leaflet(s). Normal mobility of the mitral valve leaflets. Mild mitral annular calcification. No evidence of mitral valve regurgitation. No evidence of mitral valve stenosis. Tricuspid Valve: The tricuspid valve is normal in structure. Tricuspid valve regurgitation is mild . No evidence of tricuspid stenosis. Aortic Valve: Nodular calcification near the non coronary cusp cannot r/o vegetation on the ventricular side of valve no significant AR/AS. The aortic valve is abnormal. Aortic valve regurgitation is not visualized. No aortic stenosis is present. Pulmonic Valve: The pulmonic valve was normal in structure. Pulmonic valve regurgitation is not visualized. No evidence of pulmonic stenosis. Aorta: The aortic root is normal in size and structure. Venous: The inferior vena cava is normal in size with greater than 50% respiratory variability, suggesting right atrial pressure of 3 mmHg. IAS/Shunts: The interatrial septum was not well visualized.  LEFT VENTRICLE PLAX 2D LVIDd:         4.11 cm LVIDs:         2.83 cm LV PW:         1.39 cm LV IVS:        1.72 cm LVOT diam:     2.20 cm LV SV:         57 LV SV Index:   30 LVOT Area:     3.80 cm  RIGHT VENTRICLE RV S prime:     11.80 cm/s TAPSE (M-mode): 2.0 cm LEFT ATRIUM             Index       RIGHT ATRIUM           Index LA diam:        2.80 cm 1.48 cm/m  RA Area:     12.20 cm LA Vol (A2C):   52.6 ml 27.88 ml/m RA Volume:   30.80 ml  16.33 ml/m LA Vol (A4C):   30.0 ml 15.90 ml/m LA Biplane Vol: 41.7 ml 22.11 ml/m  AORTIC VALVE LVOT Vmax:   110.00 cm/s LVOT Vmean:  68.500 cm/s LVOT VTI:    0.149 m  AORTA Ao Root diam: 3.30 cm MITRAL VALVE MV Area (PHT): 5.54 cm     SHUNTS MV Decel Time: 137 msec     Systemic VTI:  0.15 m MV E velocity: 101.00 cm/s  Systemic Diam: 2.20 cm Jenkins Rouge MD Electronically signed by Jenkins Rouge MD Signature Date/Time: 10/27/2019/5:44:49 PM    Final      Medications:   . sodium  chloride    . sodium chloride    . amiodarone Stopped (10/27/19 1718)  . ceFEPime (MAXIPIME) IV Stopped (10/28/19 0028)  . dextrose 50 mL/hr at 10/28/19 0600  . famotidine (PEPCID) IV Stopped (10/27/19 2231)  . midazolam 4 mg/hr (10/28/19 0600)  . norepinephrine (LEVOPHED) Adult infusion 5 mcg/min (10/28/19 0600)  . propofol (DIPRIVAN) infusion Stopped (10/27/19 1529)   . chlorhexidine gluconate (MEDLINE KIT)  15 mL Mouth Rinse BID  . Chlorhexidine Gluconate Cloth  6 each Topical Daily  . heparin  5,000 Units Subcutaneous Q8H  . insulin aspart  0-6 Units Subcutaneous Q4H  . ipratropium-albuterol  3 mL Nebulization QID  . mouth rinse  15 mL Mouth Rinse 10 times per day  . sevelamer carbonate  3.2 g Oral TID WC  . vancomycin variable dose per unstable renal function (pharmacist dosing)   Does not apply See admin instructions   sodium chloride, sodium chloride, acetaminophen, lidocaine (PF), lidocaine-prilocaine, ondansetron (ZOFRAN) IV, pentafluoroprop-tetrafluoroeth, polyethylene glycol  Assessment/ Plan:  [place] [address]  TTS schedule  Left AVF  3 hours and 15 minutes BF 400  DF 800 2K/2.5 Ca EDW 65 kg (left at 64.5 kg on 6/12) epo 2400 units each tx  hectoral 0.5 mcg of hectoral venofer 50 mg weekly on Tuesdays     End-stage renal disease Tuesday Thursday Saturday dialysis.  Received dialysis 10/27/2019 in the setting of life-threatening hyperkalemia.  This seems to have resolved.  He now is intubated for respiratory failure.  He is hypotensive and the question will be whether to place him on intermittent dialysis or start CRRT.  I doubt we will be able to remove significant volume secondary to his hypotension.  Hypertension/volume patient now on IV pressors Levophed for pressure support.  Acute hypoxic respiratory failure mechanical ventilation as per critical care medicine  Anemia check iron studies anticipate need for ESA  Metabolic bone disease we will continue  binders once eating.  Holding Hectorol secondary to hypercalcemia   LOS: 1 Sherril Croon '@TODAY' '@7' :15 AM

## 2019-10-28 NOTE — Progress Notes (Signed)
Renal Navigator provided update based on progress notes to patient's OP HD clinic to provide continuity of care.  Alphonzo Cruise, [place] Renal Navigator [phone]

## 2019-10-28 NOTE — Procedures (Signed)
Hemodialysis Catheter Insertion Procedure Note Steven Sellers [id number] Apr 12, 1961  Procedure: Insertion of Hemodialysis Catheter Indications: Dialysis Access   Procedure Details Consent: Risks of procedure as well as the alternatives and risks of each were explained to the (patient/caregiver).  Consent for procedure obtained. Time Out: Verified patient identification, verified procedure, site/side was marked, verified correct patient position, special equipment/implants available, medications/allergies/relevent history reviewed, required imaging and test results available.  Performed  Maximum sterile technique was used including antiseptics, cap, gloves, gown, hand hygiene, mask and sheet. Skin prep: Chlorhexidine; local anesthetic administered Triple lumen hemodialysis catheter was inserted into left internal jugular vein using the Seldinger technique.  Evaluation Blood flow good Complications: No apparent complications Patient did tolerate procedure well. Chest X-ray ordered to verify placement.  CXR: pending.   Steven Sellers Jaymie Misch 10/28/2019

## 2019-10-28 NOTE — Progress Notes (Signed)
NAME:  Steven Sellers, MRN:  [id number], DOB:  04-Jan-1961, LOS: 1 ADMISSION DATE:  10/27/2019, CONSULTATION DATE:  10/27/19  REFERRING MD:  EDP , CHIEF COMPLAINT:  resp distress/ acute hyperkalemia   Brief History   60 yo white male smoker esrf/ HD dep missed HD 10/22/19 and 10/25/19  and presented am 6/17 [facility] with sob and ventricular arrthmias and found to have severe hyperkalemia rx with CaCl2, bicarb, insulin and Dextrose and plans to start HD at 230 pm and PCCM service consulted re acute management and ? Need for transfer to [facility] with concern may need continuous HD thru the night not available at [facility]  History of present illness   None directly from pt. From last admit: Admit date: 10/01/2019 Discharge date: 10/07/2019  Admitted From: Home Disposition: [facility] SN[facility]    History of present illness: 59 y.o.malepast medical history that includes limb-girdle muscular dystrophy, end-stage renal disease on dialysis Tuesday Thursday Saturday, PE in 2012, chronic A. fib not on anticoagulation, hypertension admitted May 22 with right femur fracture after mechanical fall at the dialysis center.  Hospital course:  Right distal femur fracture Patient presented following mechanical fall at hemodialysis. Imaging notable for distal right femur fracture. Patient underwent ORIF on 10/03/2019 by Dr. Doreatha Martin.  Patient is touchdown weightbearing status right lower extremity.  Continue pain control with Tylenol, Robaxin, oxycodone as needed.  Continue postoperative DVT prophylaxis with heparin every 8 hours x 21 days.  Will need 2-week follow-up with orthopedics, Dr. Doreatha Martin for repeat x-rays and suture removal.  Discharging to [facility].  A. fib with RVR Following surgical invention, patient developed A. fib with RVR in PACU, initially requiring Cardizem drip which was transitioned back tooralCardizem.  Continue Cardizem CD 240 mg p.o. daily.  Not on chronic anticoagulation due to recurrent falls  and patient declines due to ecchymosis.  ESRD on HD TTS Patient follows with outpatient nephrology and dialysis in [place].  Chronic diastolic congestive heart failure, compensated Hx of essential hypertension TTE 2017 with EF 35-45% with diastolic dysfunction. Volume managed with HD. Continue Cardizem.  Continue HD per nephrology  Limb-girdle muscular dystrophy/ baseline still walking with cane prior to fx Appears to be at baseline.  Continue therapy at rehab.  Tobacco use disorder Counseled on need for cessation  Anemia of chronic disease with likely component of postoperative blood loss anemia Hemoglobin stable, 7.6 at time of discharge. Continue Aranesp weekly with HD  Vitamin D deficiency drisdol 50,000 units PO q7 days and vitamin D3 2000 units p.o. daily  Discharge Diagnoses:  Principal Problem:   Closed displaced supracondylar fracture of distal end of right femur with intracondylar extension ([place]) Active Problems:   Essential hypertension   Muscle atrophy   Limb-girdle muscular dystrophy ([place])   Anemia   ESRD needing dialysis (HCC)   Chronic a-fib ----Declined Anti-coagulation due to Frequent Bruising   Atrial fibrillation with RVR ([place])    Past Medical History   MD/ limb-girdle complicated by falls > fx R femur  ORIF on 10/03/2019 by Dr. Doreatha Martin. ESRF CAF HBP  Significant Hospital Events   Admit to [facility] ER awaiting disposition Placed on amio drip pm 6/17 due to VT with high k  Consults:  PCCM  Procedures:  Oral ET  6/17  L  Fem CVL by edp 6/17   Significant Diagnostic Tests:    Micro Data:  BC x 2 6/17 >>> Covid 19 PCR  6/17 neg   Antimicrobials:  cefemime  6/17  >>> Vancomycin 6/17 >>> Metronidazole 6/17  >>>   Scheduled Meds: . chlorhexidine gluconate (MEDLINE KIT)  15 mL Mouth Rinse BID  . Chlorhexidine Gluconate Cloth  6 each Topical Daily  . heparin  5,000 Units Subcutaneous Q8H  . insulin aspart  0-6 Units Subcutaneous  Q4H  . ipratropium-albuterol  3 mL Nebulization QID  . mouth rinse  15 mL Mouth Rinse 10 times per day  . sevelamer carbonate  3.2 g Oral TID WC  . vancomycin variable dose per unstable renal function (pharmacist dosing)   Does not apply See admin instructions   Continuous Infusions: . sodium chloride    . sodium chloride    . amiodarone Stopped (10/27/19 1718)  . ceFEPime (MAXIPIME) IV Stopped (10/28/19 0028)  . dextrose 50 mL/hr at 10/28/19 0600  . famotidine (PEPCID) IV Stopped (10/27/19 2231)  . midazolam 4 mg/hr (10/28/19 0600)  . norepinephrine (LEVOPHED) Adult infusion 5 mcg/min (10/28/19 0600)  . propofol (DIPRIVAN) infusion Stopped (10/27/19 1529)   PRN Meds:.   Interim history/subjective:  Sedated on vent  Objective   Blood pressure 92/64, pulse (!) 105, temperature 98.7 F (37.1 C), temperature source Oral, resp. rate 18, height '6\' 2"'  (1.88 m), weight 64.8 kg, SpO2 100 %.    Vent Mode: PRVC FiO2 (%):  [40 %-100 %] 40 % Set Rate:  [18 bmp] 18 bmp Vt Set:  [600 mL] 600 mL PEEP:  [5 cmH20] 5 cmH20 Plateau Pressure:  [17 cmH20-19 cmH20] 19 cmH20   Intake/Output Summary (Last 24 hours) at 10/28/2019 0708 Last data filed at 10/28/2019 0600 Gross per 24 hour  Intake 769.7 ml  Output 2216 ml  Net -1446.3 ml   Filed Weights   10/27/19 1058 10/27/19 2205  Weight: 65 kg 64.8 kg    Examination: GEN: no acute distress HEENT: ETT in place, minimal secretions CV: RRR, ext warm PULM: Clear, no wheezing GI: Soft, hypoactive BS EXT: bruising over arms, RUE fistula CDI NEURO: withdraws to pain x 4 PSYCH: heavily sedated SKIN: bruising as above  Labs improved with HD WBC 18>>14 CXR R hemidiaphragm elevation chronic with hyperinflation on left CBGs a bit low ABG resp alkalosis  Resolved Hospital Problem list     Assessment & Plan:  Symptomatic Hyperkalemia manifested by Vtach vs. afib/aberrancy- improved with amiodarone and HD - No further need for amiodarone,  induced by hyperkalemia  Acute hypoxemic respiratory failure- due to arrythmia, unclear why BIPAP not used as bridge - SAT/SBT  ESRD on HD- [facility] [place] - Resume when able  Question sepsis, question LLL pna- CXR not impressive.  MRSA PCR swab positive.  Fine for 48h empiric coverage then would narrow to rocephin or augmentin to complete 5-7 day course.  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, needs PFTs to confirm  Prior PE not on [facility]  Muscular dystrophy  SNF/LTACH resident   Best practice:  Diet: NPO, will see if can wean off vent Pain/Anxiety/Delirium protocol (if indicated): diprovan drip VAP protocol (if indicated):   DVT prophylaxis: sub q hep GI prophylaxis  H2  Glucose control: n/a  Mobility: sbr  Code Status: full code Family Communication: will update if they come in Dispo: ICU pending vent and pressor liberation    The patient is critically ill with multiple organ systems failure and requires high complexity decision making for assessment and support, frequent evaluation and titration of therapies, application of advanced monitoring technologies and  extensive interpretation of multiple databases. Critical Care Time devoted to patient care services described in this note independent of APP/resident time (if applicable)  is 34 minutes.   Erskine Emery MD [facility] 10/28/2019 7:14 AM Personal pager: [phone] If unanswered, please page CCM On-call: [phone]

## 2019-10-28 NOTE — Procedures (Signed)
Cortrak  Person Inserting Tube:  Rosalita Carey, Creola Corn, RD Tube Type:  Cortrak - 43 inches Tube Location:  Left nare Initial Placement:  Stomach Secured by: Bridle Technique Used to Measure Tube Placement:  Documented cm marking at nare/ corner of mouth Cortrak Secured At:  65 cm    Cortrak Tube Team Note:  Consult received to place a Cortrak feeding tube.   No x-ray is required. RN may begin using tube.    If the tube becomes dislodged please keep the tube and contact the Cortrak team at [email or web address] (password [email or web address]) for replacement.  If after hours and replacement cannot be delayed, place a NG tube and confirm placement with an abdominal x-ray.    Larkin Ina, MS, RD, LDN RD pager number and weekend/on-call pager number located in [place].

## 2019-10-28 NOTE — Progress Notes (Signed)
[place] Progress Note Patient Name: Steven Sellers DOB: 02-Mar-1961 MRN: [id number]   Date of Service  10/28/2019  HPI/Events of Note  Hypoglycemia  eICU Interventions  D 10 W infusion started @ 50 ml / hour        Frederik Pear 10/28/2019, 2:48 AM

## 2019-10-28 NOTE — Progress Notes (Signed)
OG tube advanced 5 cm per NP.

## 2019-10-28 NOTE — Procedures (Signed)
Cortrak  Person Inserting Tube:  Lakya Schrupp, Creola Corn, RD Tube Type:  Cortrak - 43 inches Tube Location:  Left nare Initial Placement:  Stomach Secured by: Bridle Technique Used to Measure Tube Placement:  Documented cm marking at nare/ corner of mouth Cortrak Secured At:  71 cm    Cortrak Tube Team Note:  Consult received to place a Cortrak feeding tube.   No x-ray is required. RN may begin using tube.    If the tube becomes dislodged please keep the tube and contact the Cortrak team at [email or web address] (password [email or web address]) for replacement.  If after hours and replacement cannot be delayed, place a NG tube and confirm placement with an abdominal x-ray.    Larkin Ina, MS, RD, LDN RD pager number and weekend/on-call pager number located in [place].

## 2019-10-28 NOTE — Consult Note (Signed)
   I evaluated patient in the ICU.  He is currently intubated and sedated.  His left arm fistula does have a thrill but was reportedly not working as an outpatient and he was scheduled for fistulogram.  I will touch base with him once he is extubated to discuss options moving forward which will include fistulogram with possible intervention possible open revision with possible need tunneled catheter prior to discharge.  Shamela Haydon C. Donzetta Matters, MD [facility] Office: [phone] Pager: [phone]

## 2019-10-28 NOTE — Progress Notes (Signed)
Spoke with family who were hoping Dr. Donzetta Matters from vascular could evaluate Steven Sellers for potential fistula revision/repair during this hospitalization.  Consult was placed and Dr. Donzetta Matters will see, appreciate help.  Erskine Emery MD PCCM

## 2019-10-28 NOTE — Progress Notes (Signed)
[place] Progress Note Patient Name: Steven Sellers DOB: [birth year] MRN: [id number]   Date of Service  10/28/2019  HPI/Events of Note  Pt with ESRD, missed dialysis, acute pulmonary edema, K+ 7.8 and wide complex tachycardia, treated at [facility] with intubation, Amiodarone and hemodialysis with normalization of K+, transported to 3 M 02 for further Rx, Pt hypotensive on arrival to 3 M 02.  eICU Interventions  New Patient Evaluation completed, Norepinephrine for hypotension.        [name] 10/28/2019, 12:02 AM

## 2019-10-29 ENCOUNTER — Inpatient Hospital Stay (HOSPITAL_COMMUNITY): Payer: Medicare Other

## 2019-10-29 LAB — BASIC METABOLIC PANEL
Anion gap: 13 (ref 5–15)
Anion gap: 13 (ref 5–15)
BUN: 27 mg/dL — ABNORMAL HIGH (ref 6–20)
BUN: 8 mg/dL (ref 6–20)
CO2: 24 mmol/L (ref 22–32)
CO2: 30 mmol/L (ref 22–32)
Calcium: 8.9 mg/dL (ref 8.9–10.3)
Calcium: 8.7 mg/dL — ABNORMAL LOW (ref 8.9–10.3)
Chloride: 95 mmol/L — ABNORMAL LOW (ref 98–111)
Chloride: 90 mmol/L — ABNORMAL LOW (ref 98–111)
Creatinine, Ser: 4.77 mg/dL — ABNORMAL HIGH (ref 0.61–1.24)
Creatinine, Ser: 2.01 mg/dL — ABNORMAL HIGH (ref 0.61–1.24)
GFR calc Af Amer: 14 mL/min — ABNORMAL LOW (ref >60)
GFR calc Af Amer: 41 mL/min — ABNORMAL LOW (ref >60)
GFR calc non Af Amer: 12 mL/min — ABNORMAL LOW (ref >60)
GFR calc non Af Amer: 35 mL/min — ABNORMAL LOW (ref >60)
Glucose, Bld: 150 mg/dL — ABNORMAL HIGH (ref 70–99)
Glucose, Bld: 129 mg/dL — ABNORMAL HIGH (ref 70–99)
Potassium: 5 mmol/L (ref 3.5–5.1)
Potassium: 3.4 mmol/L — ABNORMAL LOW (ref 3.5–5.1)
Sodium: 132 mmol/L — ABNORMAL LOW (ref 135–145)
Sodium: 133 mmol/L — ABNORMAL LOW (ref 135–145)

## 2019-10-29 LAB — CBC WITH DIFFERENTIAL/PLATELET
Abs Immature Granulocytes: 0.04 10*3/uL (ref 0.00–0.07)
Basophils Absolute: 0 10*3/uL (ref 0.0–0.1)
Basophils Relative: 0 %
Eosinophils Absolute: 0 10*3/uL (ref 0.0–0.5)
Eosinophils Relative: 0 %
HCT: 20 % — ABNORMAL LOW (ref 39.0–52.0)
Hemoglobin: 6.1 g/dL — CL (ref 13.0–17.0)
Immature Granulocytes: 1 %
Lymphocytes Relative: 9 %
Lymphs Abs: 0.6 10*3/uL — ABNORMAL LOW (ref 0.7–4.0)
MCH: 31.3 pg (ref 26.0–34.0)
MCHC: 30.5 g/dL (ref 30.0–36.0)
MCV: 102.6 fL — ABNORMAL HIGH (ref 80.0–100.0)
Monocytes Absolute: 0.6 10*3/uL (ref 0.1–1.0)
Monocytes Relative: 10 %
Neutro Abs: 5.2 10*3/uL (ref 1.7–7.7)
Neutrophils Relative %: 80 %
Platelets: 188 10*3/uL (ref 150–400)
RBC: 1.95 MIL/uL — ABNORMAL LOW (ref 4.22–5.81)
RDW: 14.5 % (ref 11.5–15.5)
WBC: 6.5 10*3/uL (ref 4.0–10.5)
nRBC: 0 % (ref 0.0–0.2)

## 2019-10-29 LAB — GLUCOSE, CAPILLARY
Glucose-Capillary: 126 mg/dL — ABNORMAL HIGH (ref 70–99)
Glucose-Capillary: 114 mg/dL — ABNORMAL HIGH (ref 70–99)
Glucose-Capillary: 113 mg/dL — ABNORMAL HIGH (ref 70–99)
Glucose-Capillary: 121 mg/dL — ABNORMAL HIGH (ref 70–99)
Glucose-Capillary: 94 mg/dL (ref 70–99)
Glucose-Capillary: 114 mg/dL — ABNORMAL HIGH (ref 70–99)

## 2019-10-29 LAB — MAGNESIUM
Magnesium: 2 mg/dL (ref 1.7–2.4)
Magnesium: 1.8 mg/dL (ref 1.7–2.4)

## 2019-10-29 LAB — PHOSPHORUS
Phosphorus: 6.5 mg/dL — ABNORMAL HIGH (ref 2.5–4.6)
Phosphorus: 2.9 mg/dL (ref 2.5–4.6)

## 2019-10-29 LAB — PREPARE RBC (CROSSMATCH)

## 2019-10-29 LAB — HEMOGLOBIN AND HEMATOCRIT, BLOOD
HCT: 26.2 % — ABNORMAL LOW (ref 39.0–52.0)
Hemoglobin: 8.5 g/dL — ABNORMAL LOW (ref 13.0–17.0)

## 2019-10-29 MED ORDER — SODIUM CHLORIDE 0.9 % IV SOLN
250.0000 mg | Freq: Every day | INTRAVENOUS | Status: AC
  Administered 2019-10-29 – 2019-11-01 (×4): 250 mg via INTRAVENOUS
  Filled 2019-10-29 (×4): qty 20

## 2019-10-29 MED ORDER — AMIODARONE HCL IN DEXTROSE 360-4.14 MG/200ML-% IV SOLN
60.0000 mg/h | INTRAVENOUS | Status: AC
  Administered 2019-10-29: 60 mg/h via INTRAVENOUS
  Filled 2019-10-29: qty 200

## 2019-10-29 MED ORDER — SEVELAMER CARBONATE 2.4 G PO PACK
3.2000 g | PACK | Freq: Three times a day (TID) | ORAL | Status: DC
  Administered 2019-10-29 (×2): 3.2 g
  Filled 2019-10-29 (×4): qty 1

## 2019-10-29 MED ORDER — CHLORHEXIDINE GLUCONATE CLOTH 2 % EX PADS
6.0000 | MEDICATED_PAD | Freq: Every day | CUTANEOUS | Status: DC
  Administered 2019-10-31 – 2019-11-03 (×4): 6 via TOPICAL

## 2019-10-29 MED ORDER — AMIODARONE HCL IN DEXTROSE 360-4.14 MG/200ML-% IV SOLN
30.0000 mg/h | INTRAVENOUS | Status: DC
  Administered 2019-10-30 (×2): 30 mg/h via INTRAVENOUS
  Filled 2019-10-29 (×2): qty 200

## 2019-10-29 MED ORDER — DARBEPOETIN ALFA 200 MCG/0.4ML IJ SOSY
PREFILLED_SYRINGE | INTRAMUSCULAR | Status: AC
  Administered 2019-10-29: 200 ug via INTRAVENOUS
  Filled 2019-10-29: qty 0.4

## 2019-10-29 MED ORDER — HEPARIN SODIUM (PORCINE) 1000 UNIT/ML IJ SOLN
INTRAMUSCULAR | Status: AC
  Filled 2019-10-29: qty 5

## 2019-10-29 MED ORDER — ORAL CARE MOUTH RINSE
15.0000 mL | Freq: Two times a day (BID) | OROMUCOSAL | Status: DC
Start: 2019-10-29 — End: 2019-10-29

## 2019-10-29 MED ORDER — DARBEPOETIN ALFA 200 MCG/0.4ML IJ SOSY: 200.0000 ug | PREFILLED_SYRINGE | INTRAMUSCULAR | Status: DC

## 2019-10-29 MED ORDER — SODIUM CHLORIDE 0.9% IV SOLUTION: Freq: Once | INTRAVENOUS | Status: DC

## 2019-10-29 MED ORDER — MIDODRINE HCL 5 MG PO TABS
5.0000 mg | ORAL_TABLET | Freq: Three times a day (TID) | ORAL | Status: DC
  Administered 2019-10-29 – 2019-10-30 (×3): 5 mg
  Filled 2019-10-29 (×4): qty 1

## 2019-10-29 MED ORDER — ORAL CARE MOUTH RINSE
15.0000 mL | Freq: Two times a day (BID) | OROMUCOSAL | Status: DC
  Administered 2019-10-30 – 2019-11-02 (×6): 15 mL via OROMUCOSAL

## 2019-10-29 MED ORDER — AMIODARONE IV BOLUS ONLY 150 MG/100ML
150.0000 mg | Freq: Once | INTRAVENOUS | Status: AC
  Administered 2019-10-29: 150 mg via INTRAVENOUS
  Filled 2019-10-29: qty 100

## 2019-10-29 NOTE — Progress Notes (Signed)
[facility] ROUNDING NOTE   Subjective:   59 year old gentleman with history end-stage renal disease Tuesday Thursday Saturday dialysis hypertension muscular dystrophy history of tobacco abuse.  Presents to [facility] with increasing shortness of breath.  His last dialysis treatment was Saturday, 10/22/2019.  He did not receive his dialysis treatment Tuesday, 10/25/2019.  He was found to be hyperkalemic with a potassium of 7.6.  He was emergently dialyzed.  He was also placed on the ventilator 10/27/2019 for his acute hypoxic respiratory failure.  Blood pressure 131/68 pulse 94 temperature 97.8 O2 sats 90% FiO2 40%  Labs pending 10/29/2019  Renvela 3.2 g 3 times daily  IV D10 50 cc an hour    Objective:  Vital signs in last 24 hours:  Temp:  [97.7 F (36.5 C)-98.9 F (37.2 C)] 97.8 F (36.6 C) (06/19 0341) Pulse Rate:  [75-109] 94 (06/19 0732) Resp:  [14-26] 14 (06/19 0732) BP: (77-131)/(52-78) 131/68 (06/19 0732) SpO2:  [99 %-100 %] 100 % (06/19 0732) FiO2 (%):  [40 %] 40 % (06/19 0732) Weight:  [66.5 kg] 66.5 kg (06/19 0500)  Weight change: 1.5 kg Filed Weights   10/27/19 1058 10/27/19 2205 10/29/19 0500  Weight: 65 kg 64.8 kg 66.5 kg    Intake/Output: I/O last 3 completed shifts: In: 3129.2 [I.V.:1974.7; NG/GT:420; IV Piggyback:734.5] Out: -    Intake/Output this shift:  No intake/output data recorded.  General: adult male in bed intubated HEENT: NCAT Heart: S1S2 tachycardia Lungs: absent on left anteriorly ;reduce on right anteriorly Abdomen: soft/nd Extremities: 1+ edema left > right upper extremity; no pitting lower extremity edema Skin: no rash on extremities exposed  Neuro: on continuous sedation     Basic Metabolic Panel: Recent Labs  Lab 10/27/19 1224 10/27/19 1224 10/27/19 1227 10/27/19 1227 10/27/19 1515 10/27/19 2139 10/28/19 0421 10/28/19 0429 10/28/19 1301 10/28/19 1802 10/29/19 0419  NA 138   < > 134*  --   --   135 135 138  --   --  132*  K 7.9*   < > 7.6*   < > 4.5 3.7 4.4 4.5  --   --  5.0  CL 96*  --  105  --   --   --   --  97*  --   --  95*  CO2 22  --   --   --   --   --   --  26  --   --  24  GLUCOSE 92  --  95  --   --   --   --  99  --   --  150*  BUN 80*  --  70*  --   --   --   --  21*  --   --  27*  CREATININE 8.87*  --  9.30*  --   --   --   --  4.01*  --   --  4.77*  CALCIUM 11.2*  --   --   --   --   --   --  8.9  --   --  8.9  MG  --   --   --   --   --   --   --  1.6* 1.7 1.7 2.0  PHOS  --   --   --   --   --   --   --   --  5.9* 5.7* 6.5*   < > = values in this  interval not displayed.    Liver Function Tests: Recent Labs  Lab 10/27/19 1224 10/28/19 0429  AST 16 11*  ALT 7 6  ALKPHOS 110 73  BILITOT 1.5* 1.3*  PROT 6.6 5.1*  ALBUMIN 2.7* 2.2*   No results for input(s): LIPASE, AMYLASE in the last 168 hours. No results for input(s): AMMONIA in the last 168 hours.  CBC: Recent Labs  Lab 10/27/19 1224 10/27/19 1224 10/27/19 1227 10/27/19 2139 10/28/19 0421 10/28/19 0429 10/29/19 0419  WBC 18.7*  --   --   --   --  14.1* 6.5  NEUTROABS 17.2*  --   --   --   --  12.1* 5.2  HGB 9.0*   < > 9.5* 8.2* 7.5* 7.2* 6.1*  HCT 29.3*   < > 28.0* 24.0* 22.0* 23.7* 20.0*  MCV 102.8*  --   --   --   --  103.5* 102.6*  PLT 473*  --   --   --   --  336 188   < > = values in this interval not displayed.    Cardiac Enzymes: No results for input(s): CKTOTAL, CKMB, CKMBINDEX, TROPONINI in the last 168 hours.  BNP: Invalid input(s): POCBNP  CBG: Recent Labs  Lab 10/28/19 1108 10/28/19 1622 10/28/19 1918 10/28/19 2348 10/29/19 0322  GLUCAP 95 130* 119* 122* 126*    Microbiology: Results for orders placed or performed during the hospital encounter of 10/27/19  SARS Coronavirus 2 by RT PCR (hospital order, performed in [facility] hospital lab) Nasopharyngeal Nasopharyngeal Swab     Status: None   Collection Time: 10/27/19 12:25 PM   Specimen: Nasopharyngeal Swab   Result Value Ref Range Status   SARS Coronavirus 2 NEGATIVE NEGATIVE Final    Comment: (NOTE) SARS-CoV-2 target nucleic acids are NOT DETECTED.  The SARS-CoV-2 RNA is generally detectable in upper and lower respiratory specimens during the acute phase of infection. The lowest concentration of SARS-CoV-2 viral copies this assay can detect is 250 copies / mL. A negative result does not preclude SARS-CoV-2 infection and should not be used as the sole basis for treatment or other patient management decisions.  A negative result may occur with improper specimen collection / handling, submission of specimen other than nasopharyngeal swab, presence of viral mutation(s) within the areas targeted by this assay, and inadequate number of viral copies (<250 copies / mL). A negative result must be combined with clinical observations, patient history, and epidemiological information.  Fact Sheet for Patients:   StrictlyIdeas.no  Fact Sheet for Healthcare Providers: BankingDealers.co.za  This test is not yet approved or  cleared by the [place] FDA and has been authorized for detection and/or diagnosis of SARS-CoV-2 by FDA under an Emergency Use Authorization (EUA).  This EUA will remain in effect (meaning this test can be used) for the duration of the COVID-19 declaration under Section 564(b)(1) of the Act, 21 U.S.C. section 360bbb-3(b)(1), unless the authorization is terminated or revoked sooner.  Performed at [facility], [address], [place], [place] [address]   Blood culture (routine x 2)     Status: None (Preliminary result)   Collection Time: 10/27/19  1:10 PM   Specimen: BLOOD  Result Value Ref Range Status   Specimen Description BLOOD LEFT FEMORAL CATH  Final   Special Requests   Final    BOTTLES DRAWN AEROBIC AND ANAEROBIC Blood Culture adequate volume   Culture   Final    NO GROWTH 2 DAYS Performed at  [facility],  [address], [place], [place] [address]    Report Status PENDING  Incomplete  MRSA PCR Screening     Status: Abnormal   Collection Time: 10/27/19 11:17 PM   Specimen: Nasal Mucosa; Nasopharyngeal  Result Value Ref Range Status   MRSA by PCR POSITIVE (A) NEGATIVE Final    Comment:        The GeneXpert MRSA Assay (FDA approved for NASAL specimens only), is one component of a comprehensive MRSA colonization surveillance program. It is not intended to diagnose MRSA infection nor to guide or monitor treatment for MRSA infections. RESULT CALLED TO, READ BACK BY AND VERIFIED WITH: J CRUISE RN 10/28/19 0101 JDW Performed at [facility] [address] [place], [place] [address]     Coagulation Studies: Recent Labs    10/27/19 1310  LABPROT 12.9  INR 1.0    Urinalysis: No results for input(s): COLORURINE, LABSPEC, PHURINE, GLUCOSEU, HGBUR, BILIRUBINUR, KETONESUR, PROTEINUR, UROBILINOGEN, NITRITE, LEUKOCYTESUR in the last 72 hours.  Invalid input(s): APPERANCEUR    Imaging: DG Chest 1V REPEAT Same Day  Result Date: 10/27/2019 CLINICAL DATA:  59 year old male with loss of breath sounds. EXAM: CHEST - 1 VIEW SAME DAY COMPARISON:  1323 hours today and earlier. FINDINGS: Portable AP upright view at 1435 hours. Enteric tube removed. Endotracheal tube tip is stable just above the clavicles. Stable pacer/resuscitation pads. Evidence of left lower lobe atelectasis with confluent stable retrocardiac opacity. No pneumothorax, pulmonary edema or definite pleural effusion. Ventilation appears unchanged from earlier. No confluent opacity on the right. No acute osseous abnormality identified. IMPRESSION: 1. Enteric tube removed. Stable ET tube which could be advanced 1 cm for more optimal placement. 2. Stable ventilation from earlier with left lower lobe collapse suspected. No new cardiopulmonary abnormality. Electronically Signed   By: Genevie Ann M.D.   On: 10/27/2019 14:59   DG Chest Port 1  View  Result Date: 10/29/2019 CLINICAL DATA:  Acute Respiratory Failure EXAM: PORTABLE CHEST 1 VIEW COMPARISON:  10/28/2019 FINDINGS: Endotracheal tube, feeding tube and central venous line unchanged. LEFT effusion and atelectasis noted. No pneumothorax. No pulmonary edema. IMPRESSION: Stable support apparatus. LEFT effusion and atelectasis. Electronically Signed   By: Suzy Bouchard M.D.   On: 10/29/2019 06:20   DG CHEST PORT 1 VIEW  Result Date: 10/28/2019 CLINICAL DATA:  On mechanically assisted ventilation. Acute respiratory failure with hypoxemia. EXAM: PORTABLE CHEST 1 VIEW COMPARISON:  Chest x-ray dated 10/27/2019 FINDINGS: Endotracheal tube is 4 cm above the carina in position. NG tube has been exchanged for a feeding tube in the tip is below the diaphragm. New central venous catheter has been inserted and the tip is at the junction of innominate vein and superior vena cava just above the carina. No pneumothorax. There is persistent consolidation and/or atelectasis in the left lower lobe. Minimal atelectasis at the right lung base. IMPRESSION: Persistent consolidation and/or atelectasis in the left lower lobe. Electronically Signed   By: Lorriane Shire M.D.   On: 10/28/2019 16:29   DG CHEST PORT 1 VIEW  Result Date: 10/27/2019 CLINICAL DATA:  Hypoxia EXAM: PORTABLE CHEST 1 VIEW COMPARISON:  10/27/2019, 10/01/2019 FINDINGS: Endotracheal tube tip about 3.6 cm superior to the carina. Esophageal tube tip below the diaphragm but incompletely visualized. Scarring at the right base. Persistent consolidation at the left lung base with possible effusion. Stable cardiomediastinal silhouette. IMPRESSION: 1. Endotracheal tube tip about 3.6 cm superior to carina 2. Persistent consolidation at the left lung  base with possible small effusion Electronically Signed   By: Donavan Foil M.D.   On: 10/27/2019 23:49   DG Chest Port 1 View  Result Date: 10/27/2019 CLINICAL DATA:  Intubated EXAM: PORTABLE CHEST 1  VIEW COMPARISON:  10/27/2019 at 2:35 p.m. FINDINGS: Single frontal view of the chest demonstrates endotracheal tube overlying tracheal air column tip just below thoracic inlet. External defibrillator pads again identified. Cardiac silhouette is stable. Persistent left lower lobe consolidation. Small left effusion not excluded. No pneumothorax. IMPRESSION: 1. Appropriate position of the endotracheal tube. 2. Persistent left lower lobe consolidation. Electronically Signed   By: Randa Ngo M.D.   On: 10/27/2019 18:27   DG Chest Portable 1 View  Result Date: 10/27/2019 CLINICAL DATA:  Post intubation. EXAM: PORTABLE CHEST 1 VIEW COMPARISON:  10/01/2019 FINDINGS: Interval placement of endotracheal tube with tip 10 cm above the carina. Lungs are adequately inflated with hazy opacification over the left base likely small effusion with atelectasis. Cardiomediastinal silhouette and remainder of the exam is unchanged. IMPRESSION: Left base opacification likely small effusion with atelectasis. Endotracheal tube with tip 10 cm above the carina. Electronically Signed   By: Marin Olp M.D.   On: 10/27/2019 12:38   DG Chest Port 1V same Day  Result Date: 10/27/2019 CLINICAL DATA:  Orogastric tube placement EXAM: PORTABLE CHEST 1 VIEW COMPARISON:  Same-day radiograph FINDINGS: Interval placement of enteric tube which is abnormally positioned following the course of the left mainstem bronchus and terminating in the region of the left lower lobe. Endotracheal tube terminates approximately 7.7 cm superior to the carina. Overlying pacer pads. Stable cardiomediastinal contours. Persistent left basilar opacification. No pneumothorax. IMPRESSION: 1. Interval placement of enteric tube which is abnormally positioned following the course of the left mainstem bronchus and terminating in the region of the left lower lobe. Recommend repositioning. 2. Endotracheal tube terminates approximately 7.7 cm superior to the carina. These  results were called by telephone at the time of interpretation on 10/27/2019 at 1:53 pm to provider Fredia Sorrow , who verbally acknowledged these results. Electronically Signed   By: Davina Poke D.O.   On: 10/27/2019 13:55   DG Abd Portable 1V  Result Date: 10/27/2019 CLINICAL DATA:  OG tube EXAM: PORTABLE ABDOMEN - 1 VIEW COMPARISON:  Chest x-ray 10/27/2019 FINDINGS: Esophageal tube tip overlies the proximal stomach, side-port in the region of GE junction. Probable small left effusion with left basilar consolidation. IMPRESSION: Esophageal tube side-port overlies GE junction, further advancement could be considered for more optimal positioning Electronically Signed   By: Donavan Foil M.D.   On: 10/27/2019 23:49   ECHOCARDIOGRAM COMPLETE  Result Date: 10/27/2019    ECHOCARDIOGRAM REPORT   Patient Name:   Steven Sellers Date of Exam: 10/27/2019 Medical Rec #:  [id number]       Height:       74.0 in Accession #:    [id number]      Weight:       143.3 lb Date of Birth:  [birth year]       BSA:          1.886 m Patient Age:    36 years        BP:           111/74 mmHg Patient Gender: M               HR:           108 bpm. Exam Location:  [facility] Procedure: 2D  Echo, Cardiac Doppler and Color Doppler Indications:    CHF-Acute Diastolic 416.60 / Y30.16  History:        Patient has prior history of Echocardiogram examinations, most                 recent 09/26/2016. CHF, Arrythmias:Atrial Fibrillation; Risk                 Factors:Hypertension. Chronic a-fib ----Declined                 Anti-coagulation due to Frequent Bruising,ESRD on                 dialysis,Muscular dystrophy ([place]) (From Hx),Tobacco abuse (From                 Hx).  Sonographer:    Alvino Chapel RCS Referring Phys: [phone] [facility]  Sonographer Comments: Patient on mechanical ventilator and dialysis at time of echo. IMPRESSIONS  1. Left ventricular ejection fraction, by estimation, is 60 to 65%. The left ventricle has normal  function. The left ventricle has no regional wall motion abnormalities. The left ventricular internal cavity size was mildly dilated. There is severe left ventricular hypertrophy. Left ventricular diastolic parameters were normal.  2. Right ventricular systolic function is normal. The right ventricular size is normal.  3. The mitral valve is normal in structure. No evidence of mitral valve regurgitation. No evidence of mitral stenosis.  4. Nodular calcification near the non coronary cusp cannot r/o vegetation on the ventricular side of valve no significant AR/AS . The aortic valve is abnormal. Aortic valve regurgitation is not visualized. No aortic stenosis is present.  5. The inferior vena cava is normal in size with greater than 50% respiratory variability, suggesting right atrial pressure of 3 mmHg. FINDINGS  Left Ventricle: Left ventricular ejection fraction, by estimation, is 60 to 65%. The left ventricle has normal function. The left ventricle has no regional wall motion abnormalities. The left ventricular internal cavity size was mildly dilated. There is  severe left ventricular hypertrophy. Left ventricular diastolic parameters were normal. Right Ventricle: The right ventricular size is normal. No increase in right ventricular wall thickness. Right ventricular systolic function is normal. Left Atrium: Left atrial size was normal in size. Right Atrium: Right atrial size was normal in size. Pericardium: There is no evidence of pericardial effusion. Mitral Valve: The mitral valve is normal in structure. There is mild thickening of the mitral valve leaflet(s). There is mild calcification of the mitral valve leaflet(s). Normal mobility of the mitral valve leaflets. Mild mitral annular calcification. No evidence of mitral valve regurgitation. No evidence of mitral valve stenosis. Tricuspid Valve: The tricuspid valve is normal in structure. Tricuspid valve regurgitation is mild . No evidence of tricuspid stenosis.  Aortic Valve: Nodular calcification near the non coronary cusp cannot r/o vegetation on the ventricular side of valve no significant AR/AS. The aortic valve is abnormal. Aortic valve regurgitation is not visualized. No aortic stenosis is present. Pulmonic Valve: The pulmonic valve was normal in structure. Pulmonic valve regurgitation is not visualized. No evidence of pulmonic stenosis. Aorta: The aortic root is normal in size and structure. Venous: The inferior vena cava is normal in size with greater than 50% respiratory variability, suggesting right atrial pressure of 3 mmHg. IAS/Shunts: The interatrial septum was not well visualized.  LEFT VENTRICLE PLAX 2D LVIDd:         4.11 cm LVIDs:         2.83 cm  LV PW:         1.39 cm LV IVS:        1.72 cm LVOT diam:     2.20 cm LV SV:         57 LV SV Index:   30 LVOT Area:     3.80 cm  RIGHT VENTRICLE RV S prime:     11.80 cm/s TAPSE (M-mode): 2.0 cm LEFT ATRIUM             Index       RIGHT ATRIUM           Index LA diam:        2.80 cm 1.48 cm/m  RA Area:     12.20 cm LA Vol (A2C):   52.6 ml 27.88 ml/m RA Volume:   30.80 ml  16.33 ml/m LA Vol (A4C):   30.0 ml 15.90 ml/m LA Biplane Vol: 41.7 ml 22.11 ml/m  AORTIC VALVE LVOT Vmax:   110.00 cm/s LVOT Vmean:  68.500 cm/s LVOT VTI:    0.149 m  AORTA Ao Root diam: 3.30 cm MITRAL VALVE MV Area (PHT): 5.54 cm     SHUNTS MV Decel Time: 137 msec     Systemic VTI:  0.15 m MV E velocity: 101.00 cm/s  Systemic Diam: 2.20 cm Jenkins Rouge MD Electronically signed by Jenkins Rouge MD Signature Date/Time: 10/27/2019/5:44:49 PM    Final      Medications:   . sodium chloride    . sodium chloride    . dexmedetomidine (PRECEDEX) IV infusion 0.4 mcg/kg/hr (10/29/19 0700)  . dextrose 50 mL/hr at 10/29/19 0700  . famotidine (PEPCID) IV Stopped (10/28/19 2321)  . feeding supplement (VITAL 1.5 CAL) 50 mL/hr at 10/29/19 0552  . midazolam Stopped (10/28/19 0839)  . norepinephrine (LEVOPHED) Adult infusion Stopped (10/28/19  2102)  . propofol (DIPRIVAN) infusion Stopped (10/27/19 1529)   . sodium chloride   Intravenous Once  . chlorhexidine gluconate (MEDLINE KIT)  15 mL Mouth Rinse BID  . Chlorhexidine Gluconate Cloth  6 each Topical Daily  . feeding supplement (PRO-STAT SUGAR FREE 64)  30 mL Per Tube Daily  . heparin  5,000 Units Subcutaneous Q8H  . insulin aspart  0-6 Units Subcutaneous Q4H  . ipratropium-albuterol  3 mL Nebulization QID  . mouth rinse  15 mL Mouth Rinse 10 times per day  . midodrine  5 mg Oral TID WC  . multivitamin  1 tablet Per Tube QHS  . sevelamer carbonate  3.2 g Oral TID WC   sodium chloride, sodium chloride, acetaminophen, heparin, lidocaine (PF), lidocaine-prilocaine, ondansetron (ZOFRAN) IV, pentafluoroprop-tetrafluoroeth, polyethylene glycol  Assessment/ Plan:  [place] [address]  TTS schedule  Left AVF  3 hours and 15 minutes BF 400  DF 800 2K/2.5 Ca EDW 65 kg (left at 64.5 kg on 6/12) epo 2400 units each tx  hectoral 0.5 mcg of hectoral venofer 50 mg weekly on Tuesdays     End-stage renal disease Tuesday Thursday Saturday dialysis.  Received dialysis 10/27/2019 in the setting of life-threatening hyperkalemia.  This seems to have resolved.  He now is intubated for respiratory failure.  Next dialysis treatment 10/29/2019 scheduled  Hypertension/volume patient now discontinued Levophed.  Acute hypoxic respiratory failure mechanical ventilation as per critical care medicine  Anemia low iron stores will add IV iron.  We will stop ESA.  Metabolic bone disease we will continue binders once eating.  Holding Hectorol secondary to hypercalcemia  Access appreciate assistance of Dr. Rayburn Ma  Cain.  Left arm fistula apparently is not functioning.  Try to access 10/29/2019.  Has IJ catheter placed by critical care medicine 10/28/2019   LOS: [place] _0 _1 :05 AM

## 2019-10-29 NOTE — Progress Notes (Signed)
NAME:  Steven Sellers, MRN:  [id number], DOB:  Apr 07, 1961, LOS: 2 ADMISSION DATE:  10/27/2019, CONSULTATION DATE:  10/27/19  REFERRING MD:  EDP , CHIEF COMPLAINT:  resp distress/ acute hyperkalemia   Brief History   59 yo white male smoker esrf/ HD dep missed HD 10/22/19 and 10/25/19  and presented am 6/17 [facility] with sob and ventricular arrthmias and found to have severe hyperkalemia rx with CaCl2, bicarb, insulin and Dextrose and plans to start HD at 230 pm and PCCM service consulted re acute management and ? Need for transfer to [facility] with concern may need continuous HD thru the night not available at [facility]  History of present illness   None directly from pt. From last admit: Admit date: 10/01/2019 Discharge date: 10/07/2019  Admitted From: Home Disposition: [facility] SNF    History of present illness: 59 y.o.malepast medical history that includes limb-girdle muscular dystrophy, end-stage renal disease on dialysis Tuesday Thursday Saturday, PE in 2012, chronic A. fib not on anticoagulation, hypertension admitted May 22 with right femur fracture after mechanical fall at the dialysis center.  Hospital course:  Right distal femur fracture Patient presented following mechanical fall at hemodialysis. Imaging notable for distal right femur fracture. Patient underwent ORIF on 10/03/2019 by Dr. Doreatha Martin.  Patient is touchdown weightbearing status right lower extremity.  Continue pain control with Tylenol, Robaxin, oxycodone as needed.  Continue postoperative DVT prophylaxis with heparin every 8 hours x 21 days.  Will need 2-week follow-up with orthopedics, Dr. Doreatha Martin for repeat x-rays and suture removal.  Discharging to [facility].  A. fib with RVR Following surgical invention, patient developed A. fib with RVR in PACU, initially requiring Cardizem drip which was transitioned back tooralCardizem.  Continue Cardizem CD 240 mg p.o. daily.  Not on chronic anticoagulation due to recurrent falls  and patient declines due to ecchymosis.  ESRD on HD TTS Patient follows with outpatient nephrology and dialysis in [place].  Chronic diastolic congestive heart failure, compensated Hx of essential hypertension TTE 2017 with EF 93-71% with diastolic dysfunction. Volume managed with HD. Continue Cardizem.  Continue HD per nephrology  Limb-girdle muscular dystrophy/ baseline still walking with cane prior to fx Appears to be at baseline.  Continue therapy at rehab.  Tobacco use disorder Counseled on need for cessation  Anemia of chronic disease with likely component of postoperative blood loss anemia Hemoglobin stable, 7.6 at time of discharge. Continue Aranesp weekly with HD  Vitamin D deficiency drisdol 50,000 units PO q7 days and vitamin D3 2000 units p.o. daily  Discharge Diagnoses:  Principal Problem:   Closed displaced supracondylar fracture of distal end of right femur with intracondylar extension ([place]) Active Problems:   Essential hypertension   Muscle atrophy   Limb-girdle muscular dystrophy ([place])   Anemia   ESRD needing dialysis (HCC)   Chronic a-fib ----Declined Anti-coagulation due to Frequent Bruising   Atrial fibrillation with RVR ([place])    Past Medical History   MD/ limb-girdle complicated by falls > fx R femur  ORIF on 10/03/2019 by Dr. Doreatha Martin. ESRF CAF HBP  Significant Hospital Events   Admit to [facility] ER awaiting disposition Placed on amio drip pm 6/17 due to VT with high k  Consults:  PCCM  Procedures:  Oral ET  6/17  L  Fem CVL by edp 6/17   Significant Diagnostic Tests:    Micro Data:  BC x 2 6/17 >>> Covid 19 PCR  6/17 neg   Antimicrobials:  cefemime  6/17  >>> Vancomycin 6/17 >>> Metronidazole 6/17  >>>   Scheduled Meds: . sodium chloride   Intravenous Once  . chlorhexidine gluconate (MEDLINE KIT)  15 mL Mouth Rinse BID  . Chlorhexidine Gluconate Cloth  6 each Topical Daily  . Chlorhexidine Gluconate Cloth  6 each  Topical Q0600  . Darbepoetin Alfa      . darbepoetin (ARANESP) injection - DIALYSIS  200 mcg Intravenous Q Sat-HD  . feeding supplement (PRO-STAT SUGAR FREE 64)  30 mL Per Tube Daily  . heparin  5,000 Units Subcutaneous Q8H  . heparin sodium (porcine)      . insulin aspart  0-6 Units Subcutaneous Q4H  . ipratropium-albuterol  3 mL Nebulization QID  . mouth rinse  15 mL Mouth Rinse 10 times per day  . midodrine  5 mg Per Tube TID WC  . multivitamin  1 tablet Per Tube QHS  . sevelamer carbonate  3.2 g Per Tube TID WC   Continuous Infusions: . sodium chloride    . sodium chloride    . dexmedetomidine (PRECEDEX) IV infusion Stopped (10/29/19 0739)  . dextrose 50 mL/hr at 10/29/19 1300  . famotidine (PEPCID) IV Stopped (10/28/19 2321)  . feeding supplement (VITAL 1.5 CAL) 50 mL/hr at 10/29/19 0552  . ferric gluconate (FERRLECIT/NULECIT) IV Stopped (10/29/19 1110)  . midazolam Stopped (10/28/19 0839)  . norepinephrine (LEVOPHED) Adult infusion Stopped (10/28/19 2102)  . propofol (DIPRIVAN) infusion Stopped (10/27/19 1529)   PRN Meds:.   Interim history/subjective:   Tolerated 2-hour SBT and successfully extubated.  No voiced complaints.  Being hooked up for conventional hemodialysis.  Objective   Blood pressure (!) 157/86, pulse (!) 112, temperature 98.9 F (37.2 C), temperature source Oral, resp. rate (!) 22, height 6' 2" (1.88 m), weight 66.5 kg, SpO2 100 %.    Vent Mode: PSV;CPAP FiO2 (%):  [40 %] 40 % Set Rate:  [18 bmp] 18 bmp Vt Set:  [600 mL] 600 mL PEEP:  [5 cmH20] 5 cmH20 Pressure Support:  [5 cmH20] 5 cmH20 Plateau Pressure:  [12 cmH20-19 cmH20] 12 cmH20   Intake/Output Summary (Last 24 hours) at 10/29/2019 1411 Last data filed at 10/29/2019 1300 Gross per 24 hour  Intake 2516.58 ml  Output --  Net 2516.58 ml   Filed Weights   10/27/19 1058 10/27/19 2205 10/29/19 0500  Weight: 65 kg 64.8 kg 66.5 kg    Examination: GEN: no acute distress HEENT: Extubated.  No  skin breakdown. CV: RRR, ext warm PULM: Clear, no wheezing GI: Soft, hypoactive BS EXT: bruising over arms, RUE fistula CDI NEURO: Intact moves all limbs. PSYCH: heavily sedated SKIN: bruising as above  Resolved Hospital Problem list     Assessment & Plan:   Symptomatic Hyperkalemia manifested by Vtach vs. afib/aberrancy- improved with amiodarone and HD -No further therapy.  Will be treated with hemodialysis.  Was critically ill due to acute hypoxemic respiratory failure.  Tolerated extubation following SBT. -Incentive spirometry  ESRD on HD- [facility] -On IHD today. -Remove temporary lines.  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 Pain/Anxiety/Delirium protocol (if indicated): No sedation required VAP protocol (if indicated): Incentive spirometry DVT prophylaxis: sub q hep GI prophylaxis not indicated Glucose control: Currently euglycemic on no therapy. Mobility: Progressive ambulation. Code Status: full code Family Communication: Patient updated. Dispo: Transfer to floor tomorrow.  CRITICAL CARE Performed by:       Total critical care time: 30 minutes  Critical care time was exclusive of separately billable procedures and treating other patients.  Critical care was necessary to treat or prevent imminent or life-threatening deterioration.  Critical care was time spent personally by me on the following activities: development of treatment plan with patient and/or surrogate as well as nursing, discussions with consultants, evaluation of patient's response to treatment, examination of patient, obtaining history from patient or surrogate, ordering and performing treatments and interventions, ordering and review of laboratory studies, ordering and review of radiographic studies, pulse oximetry, re-evaluation of patient's condition and participation  in multidisciplinary rounds.   , MD [facility] ICU Physician [facility]  Pager: [phone] Mobile: [phone] After hours: [phone].  10/29/2019, 2:19 PM     

## 2019-10-29 NOTE — Progress Notes (Signed)
Spoke with Lexi, RN concerning order to remove temporary HD cath and CVC. CVC still being used for pressors. She stated she will remove HD catheter after dialysis.

## 2019-10-29 NOTE — Procedures (Signed)
Extubation Procedure Note  Patient Details:   Name: Steven Sellers DOB: Apr 28, 1961 MRN: [id number]   Airway Documentation:    Vent end date: 10/29/19 Vent end time: 1110   Evaluation  O2 sats: stable throughout Complications: No apparent complications Patient did tolerate procedure well. Bilateral Breath Sounds: Clear, Diminished  Patient able to speak? Yes  Rudene Re 10/29/2019, 11:11 AM

## 2019-10-29 NOTE — Progress Notes (Signed)
CRITICAL VALUE ALERT  Critical Value:  Hgb 6.1  Date & Time Notied:  10/29/19 @ 0438  Provider Notified: Warren Lacy  Orders Received/Actions taken: TBD

## 2019-10-29 NOTE — Progress Notes (Addendum)
Pt noted with HD tx complete, pt fairly tolerant of HD tx. Pt noted with tachycardia, HR 150's-170's, and rhythm change to afib last hour of tx, uf stopped, dialysis completed. Pt's primary nurse made aware and amiodarone initiated. Pt denies chest pain. Pt stable at this time. Able to use fistula as ordered, no problems noted.

## 2019-10-29 NOTE — Progress Notes (Signed)
[place] Progress Note Patient Name: Steven Sellers DOB: 22-Apr-1961 MRN: [id number]   Date of Service  10/29/2019  HPI/Events of Note  Pt needs a.m. labs and CXR ordered.  eICU Interventions   a.m. labs and CXR ordered.        Kerry Kass Hollie Wojahn 10/29/2019, 4:07 AM

## 2019-10-29 NOTE — Progress Notes (Signed)
Pt noticed to go into afib rvr rate 150's-170's MD notified. amio bolus started. Pt bp stable and no complaints of chest pain

## 2019-10-29 NOTE — Progress Notes (Signed)
  Amiodarone Drug - Drug Interaction Consult Note  Recommendations: -No DDIs identified  Amiodarone is metabolized by the cytochrome P450 system and therefore has the potential to cause many drug interactions. Amiodarone has an average plasma half-life of 50 days (range 20 to 100 days).   There is potential for drug interactions to occur several weeks or months after stopping treatment and the onset of drug interactions may be slow after initiating amiodarone.   []  Statins: Increased risk of myopathy. Simvastatin- restrict dose to 20mg  daily. Other statins: counsel patients to report any muscle pain or weakness immediately.  []  Anticoagulants: Amiodarone can increase anticoagulant effect. Consider warfarin dose reduction. Patients should be monitored closely and the dose of anticoagulant altered accordingly, remembering that amiodarone levels take several weeks to stabilize.  []  Antiepileptics: Amiodarone can increase plasma concentration of phenytoin, the dose should be reduced. Note that small changes in phenytoin dose can result in large changes in levels. Monitor patient and counsel on signs of toxicity.  []  Beta blockers: increased risk of bradycardia, AV block and myocardial depression. Sotalol - avoid concomitant use.  []   Calcium channel blockers (diltiazem and verapamil): increased risk of bradycardia, AV block and myocardial depression.  []   Cyclosporine: Amiodarone increases levels of cyclosporine. Reduced dose of cyclosporine is recommended.  []  Digoxin dose should be halved when amiodarone is started.  []  Diuretics: increased risk of cardiotoxicity if hypokalemia occurs.  []  Oral hypoglycemic agents (glyburide, glipizide, glimepiride): increased risk of hypoglycemia. Patient's glucose levels should be monitored closely when initiating amiodarone therapy.   []  Drugs that prolong the QT interval:  Torsades de pointes risk may be increased with concurrent use - avoid if  possible.  Monitor QTc, also keep magnesium/potassium WNL if concurrent therapy can't be avoided. Marland Kitchen Antibiotics: e.g. fluoroquinolones, erythromycin. . Antiarrhythmics: e.g. quinidine, procainamide, disopyramide, sotalol. . Antipsychotics: e.g. phenothiazines, haloperidol.  . Lithium, tricyclic antidepressants, and methadone. Thank Concha Pyo  10/29/2019 6:24 PM

## 2019-10-29 NOTE — Progress Notes (Signed)
[place] Progress Note Patient Name: Steven Sellers DOB: 31-Jul-1960 MRN: [id number]   Date of Service  10/29/2019  HPI/Events of Note  Notified of tachycardia. Now in the 110s. Started on amiodarone today. Completed dialysis and has Mg and Phos ordered.  eICU Interventions  Include BMP to labs     Intervention Category Major Interventions: Arrhythmia - evaluation and management;Electrolyte abnormality - evaluation and management  Judd Lien 10/29/2019, 8:28 PM

## 2019-10-29 NOTE — Progress Notes (Signed)
[place] Progress Note Patient Name: Steven Sellers DOB: March 27, 1961 MRN: [id number]   Date of Service  10/29/2019  HPI/Events of Note  Hemoglobin 6.1 gm %, no evidence of active bleeding.  eICU Interventions  Transfuse 1 unit of PRBC        Chrisotpher Rivero U Vallie Teters 10/29/2019, 5:33 AM

## 2019-10-30 DIAGNOSIS — Z992 Dependence on renal dialysis: Secondary | ICD-10-CM

## 2019-10-30 DIAGNOSIS — T82898A Other specified complication of vascular prosthetic devices, implants and grafts, initial encounter: Secondary | ICD-10-CM

## 2019-10-30 DIAGNOSIS — N186 End stage renal disease: Secondary | ICD-10-CM

## 2019-10-30 LAB — BASIC METABOLIC PANEL
Anion gap: 11 (ref 5–15)
BUN: 11 mg/dL (ref 6–20)
CO2: 31 mmol/L (ref 22–32)
Calcium: 8.7 mg/dL — ABNORMAL LOW (ref 8.9–10.3)
Chloride: 92 mmol/L — ABNORMAL LOW (ref 98–111)
Creatinine, Ser: 2.47 mg/dL — ABNORMAL HIGH (ref 0.61–1.24)
GFR calc Af Amer: 32 mL/min — ABNORMAL LOW (ref >60)
GFR calc non Af Amer: 27 mL/min — ABNORMAL LOW (ref >60)
Glucose, Bld: 113 mg/dL — ABNORMAL HIGH (ref 70–99)
Potassium: 3.5 mmol/L (ref 3.5–5.1)
Sodium: 134 mmol/L — ABNORMAL LOW (ref 135–145)

## 2019-10-30 LAB — GLUCOSE, CAPILLARY
Glucose-Capillary: 99 mg/dL (ref 70–99)
Glucose-Capillary: 97 mg/dL (ref 70–99)
Glucose-Capillary: 89 mg/dL (ref 70–99)
Glucose-Capillary: 79 mg/dL (ref 70–99)
Glucose-Capillary: 116 mg/dL — ABNORMAL HIGH (ref 70–99)

## 2019-10-30 LAB — CBC
HCT: 24.2 % — ABNORMAL LOW (ref 39.0–52.0)
Hemoglobin: 7.6 g/dL — ABNORMAL LOW (ref 13.0–17.0)
MCH: 31.7 pg (ref 26.0–34.0)
MCHC: 31.4 g/dL (ref 30.0–36.0)
MCV: 100.8 fL — ABNORMAL HIGH (ref 80.0–100.0)
Platelets: 208 10*3/uL (ref 150–400)
RBC: 2.4 MIL/uL — ABNORMAL LOW (ref 4.22–5.81)
RDW: 14.6 % (ref 11.5–15.5)
WBC: 6.5 10*3/uL (ref 4.0–10.5)
nRBC: 0 % (ref 0.0–0.2)

## 2019-10-30 LAB — TYPE AND SCREEN
ABO/RH(D): A POS
Antibody Screen: NEGATIVE
Unit division: 0

## 2019-10-30 LAB — BPAM RBC
Blood Product Expiration Date: 202107082359
ISSUE DATE / TIME: 202106190851
Unit Type and Rh: 6200

## 2019-10-30 MED ORDER — SEVELAMER CARBONATE 2.4 G PO PACK
3.2000 g | PACK | Freq: Three times a day (TID) | ORAL | Status: DC
  Administered 2019-10-31 (×2): 3.2 g via ORAL
  Filled 2019-10-30 (×15): qty 1

## 2019-10-30 MED ORDER — MIDODRINE HCL 5 MG PO TABS
5.0000 mg | ORAL_TABLET | Freq: Three times a day (TID) | ORAL | Status: DC
  Filled 2019-10-30 (×2): qty 1

## 2019-10-30 MED ORDER — HYDROCODONE-ACETAMINOPHEN 7.5-325 MG PO TABS
1.0000 | ORAL_TABLET | Freq: Four times a day (QID) | ORAL | Status: DC | PRN
  Administered 2019-10-30 – 2019-10-31 (×3): 1 via ORAL
  Filled 2019-10-30 (×3): qty 1

## 2019-10-30 MED ORDER — RENA-VITE PO TABS
1.0000 | ORAL_TABLET | Freq: Every day | ORAL | Status: DC
  Administered 2019-10-30 – 2019-11-02 (×4): 1 via ORAL
  Filled 2019-10-30 (×4): qty 1

## 2019-10-30 MED ORDER — MENTHOL 3 MG MT LOZG: 1.0000 | LOZENGE | OROMUCOSAL | Status: DC | PRN

## 2019-10-30 MED ORDER — WHITE PETROLATUM EX OINT
TOPICAL_OINTMENT | CUTANEOUS | Status: AC
  Filled 2019-10-30: qty 28.35

## 2019-10-30 MED ORDER — NICOTINE 21 MG/24HR TD PT24
21.0000 mg | MEDICATED_PATCH | Freq: Every day | TRANSDERMAL | Status: DC
  Administered 2019-10-30 – 2019-11-03 (×5): 21 mg via TRANSDERMAL
  Filled 2019-10-30 (×5): qty 1

## 2019-10-30 MED ORDER — IPRATROPIUM-ALBUTEROL 0.5-2.5 (3) MG/3ML IN SOLN: 3.0000 mL | Freq: Four times a day (QID) | RESPIRATORY_TRACT | Status: DC | PRN

## 2019-10-30 NOTE — Consult Note (Signed)
  Progress Note    10/30/2019 10:11 AM   Subjective: Now extubated  Vitals:   10/30/19 0700 10/30/19 0800  BP: 121/61 124/69  Pulse: 78 86  Resp: 17 (!) 22  Temp:  98.7 F (37.1 C)  SpO2: 94% 96%    Physical Exam: Awake alert oriented Strong thrill in the upper arm fistula.  He has edema from the elbow down in the left upper extremity with evidence of infiltration on the medial aspect of the elbow and distal upper arm  CBC    Component Value Date/Time   WBC 6.5 10/30/2019 0347   RBC 2.40 (L) 10/30/2019 0347   HGB 7.6 (L) 10/30/2019 0347   HCT 24.2 (L) 10/30/2019 0347   HCT 29.3 (L) 12/01/2015 0500   PLT 208 10/30/2019 0347   MCV 100.8 (H) 10/30/2019 0347   MCH 31.7 10/30/2019 0347   MCHC 31.4 10/30/2019 0347   RDW 14.6 10/30/2019 0347   LYMPHSABS 0.6 (L) 10/29/2019 0419   MONOABS 0.6 10/29/2019 0419   EOSABS 0.0 10/29/2019 0419   BASOSABS 0.0 10/29/2019 0419    BMET    Component Value Date/Time   NA 134 (L) 10/30/2019 0347   K 3.5 10/30/2019 0347   CL 92 (L) 10/30/2019 0347   CO2 31 10/30/2019 0347   GLUCOSE 113 (H) 10/30/2019 0347   BUN 11 10/30/2019 0347   CREATININE 2.47 (H) 10/30/2019 0347   CALCIUM 8.7 (L) 10/30/2019 0347   GFRNONAA 27 (L) 10/30/2019 0347   GFRAA 32 (L) 10/30/2019 0347    INR    Component Value Date/Time   INR 1.0 10/27/2019 1310     Intake/Output Summary (Last 24 hours) at 10/30/2019 1011 Last data filed at 10/30/2019 0930 Gross per 24 hour  Intake 2505.46 ml  Output 2335 ml  Net 170.46 ml     Assessment:  59 y.o. male is here with infiltration of the left arm AV fistula required intubation now doing much better extubated.  Left arm fistula used for dialysis yesterday.  Plan: Continue to use left arm AV fistula for dialysis  If he has further issues we can place tunneled dialysis catheter prior to discharge to allow the arm to rest further.   Gualberto Wahlen C. Donzetta Matters, MD [facility] Office:  [phone] Pager: [phone]  10/30/2019 10:11 AM

## 2019-10-30 NOTE — Progress Notes (Signed)
[place] Progress Note Patient Name: Steven Sellers DOB: [birth year] MRN: [id number]   Date of Service  10/30/2019  HPI/Events of Note  Request for nicotine patch  eICU Interventions  Nicotine patch 21 mcg ordered     Intervention Category Minor Interventions: Routine modifications to care plan (e.g. PRN medications for pain, fever)  Steven Sellers Steven Sellers 10/30/2019, 5:51 AM

## 2019-10-30 NOTE — Progress Notes (Signed)
[place] Progress Note Patient Name: Steven Sellers DOB: [birth year] MRN: [id number]   Date of Service  10/30/2019  HPI/Events of Note  Inquiry about AM labs Dialysis patient   eICU Interventions  CBC and BMP ordered     Intervention Category Minor Interventions: Clinical assessment - ordering diagnostic tests  Judd Lien 10/30/2019, 3:09 AM

## 2019-10-30 NOTE — Evaluation (Addendum)
Physical Therapy Evaluation Patient Details Name: Steven Sellers MRN: [id number] DOB: Jan 04, 1961 Today's Date: 10/30/2019   History of Present Illness  59 y.o. male past medical history that includes limb-girdle muscular dystrophy, end-stage renal disease on dialysis Tuesday Thursday Saturday, PE in 2012, chronic A. fib not on anticoagulation, hypertension and recent mechanical fall at HD center sustaining R femur fx, s/p ORIF 10/03/19, presented to [facility] with SOB and ventricular arrythmias.  Transferred to [facility] with concern that pt may need continuous HD not available a [facility].  Clinical Impression  Pt admitted with/for SOB, ventricular arrythmias and limitations from repaired right leg fx .  Pt currently limited functionally due to the problems listed. ( See problems list.)   Pt will benefit from PT to maximize function and safety in order to get ready for next venue listed below.     Follow Up Recommendations SNF;Supervision/Assistance - 24 hour    Equipment Recommendations  Other (comment);None recommended by PT (TBD)    Recommendations for Other Services       Precautions / Restrictions Precautions Precautions: Fall Restrictions RLE Weight Bearing: Touchdown weight bearing Other Position/Activity Restrictions: pt states MD stated he could put some weight on right, but not verified.      Mobility  Bed Mobility Overal bed mobility: Needs Assistance Bed Mobility: Supine to Sit     Supine to sit: Mod assist     General bed mobility comments: min to slide legs, mod to bridge to EOB, Mod truncal assist to come up and forward to midline.  Transfers Overall transfer level: Needs assistance Equipment used: None Transfers: Squat Pivot Transfers     Squat pivot transfers: Max assist;+2 safety/equipment     General transfer comment: pt does not scoot well due to little UE assist and weak trunk,  face to face assist for squat pivot on L LE.  Ambulation/Gait              General Gait Details: unable  Stairs            Wheelchair Mobility    Modified Rankin (Stroke Patients Only)       Balance Overall balance assessment: Needs assistance Sitting-balance support: Single extremity supported;No upper extremity supported;Feet supported Sitting balance-Leahy Scale: Fair Sitting balance - Comments: prefers use of hands, weak trunk       Standing balance comment: NT                             Pertinent Vitals/Pain Pain Assessment: Faces Faces Pain Scale: Hurts even more Pain Location: right leg surgical site. Pain Descriptors / Indicators: Discomfort;Grimacing Pain Intervention(s): Limited activity within patient's tolerance;Monitored during session    Home Living Family/patient expects to be discharged to:: Skilled nursing facility Living Arrangements: Spouse/significant other Available Help at Discharge: Available 24 hours/day Type of Home: House Home Access: Stairs to enter   [employer] of Steps: 2 Home Layout: One level Home Equipment: Cane - single point (hand made riser for the toilet.)      Prior Function Level of Independence: Needs assistance   Gait / Transfers Assistance Needed: pt reports he was using a cane for mobility.  Pt relates his wife having to assist getting off bed or out of lift chair progressively more often  ADL's / Homemaking Assistance Needed: pt unclear with level of assistance needed, reports he did have some assistance        Hand Dominance  Dominant Hand: Right    Extremity/Trunk Assessment   Upper Extremity Assessment Upper Extremity Assessment: Generalized weakness;Defer to OT evaluation (significant weakness bil, grossly 2 to 2+ bil, scap. winging)    Lower Extremity Assessment Lower Extremity Assessment: RLE deficits/detail;LLE deficits/detail RLE Deficits / Details: gross flexion 3-/5, gross ext 3/5, knee flexion to 85* assisted RLE Coordination: decreased fine  motor LLE Deficits / Details: grossly 4/5 overall  LLE Coordination: decreased fine motor       Communication   Communication: No difficulties  Cognition Arousal/Alertness: Awake/alert Behavior During Therapy: WFL for tasks assessed/performed Overall Cognitive Status: Within Functional Limits for tasks assessed                                        General Comments General comments (skin integrity, edema, etc.): vss on RA    Exercises Other Exercises Other Exercises: warm up hip/knee ROM and bil UE AA/PRON   Assessment/Plan    PT Assessment Patient needs continued PT services  PT Problem List Decreased strength;Decreased range of motion;Decreased activity tolerance;Decreased balance;Decreased mobility;Decreased knowledge of use of DME       PT Treatment Interventions DME instruction;Functional mobility training;Therapeutic activities;Therapeutic exercise;Balance training;Patient/family education    PT Goals (Current goals can be found in the Care Plan section)  Acute Rehab PT Goals Patient Stated Goal: start mobilizing better as my leg gets better. PT Goal Formulation: With patient Time For Goal Achievement: 11/13/19 Potential to Achieve Goals: Fair    Frequency Min 3X/week   Barriers to discharge        Co-evaluation               AM-PAC PT "6 Clicks" Mobility  Outcome Measure Help needed turning from your back to your side while in a flat bed without using bedrails?: Total Help needed moving from lying on your back to sitting on the side of a flat bed without using bedrails?: Total Help needed moving to and from a bed to a chair (including a wheelchair)?: Total Help needed standing up from a chair using your arms (e.g., wheelchair or bedside chair)?: Total Help needed to walk in hospital room?: Total Help needed climbing 3-5 steps with a railing? : Total 6 Click Score: 6    End of Session   Activity Tolerance: Patient tolerated  treatment well;Other (comment) (limited by weakness) Patient left: in chair;with call bell/phone within reach;with chair alarm set;Other (comment) (on sky lift pad.) Nurse Communication: Mobility status PT Visit Diagnosis: Other abnormalities of gait and mobility (R26.89);Muscle weakness (generalized) (M62.81)    Time: 8341-9622 PT Time Calculation (min) (ACUTE ONLY): 39 min   Charges:   PT Evaluation $PT Eval Moderate Complexity: 1 Mod PT Treatments $Therapeutic Activity: 23-37 mins        10/30/2019  Ginger Carne., PT Acute Rehabilitation Services [phone]  (pager) [phone]  (office)  Tessie Fass Annesha Delgreco 10/30/2019, 1:52 PM

## 2019-10-30 NOTE — Progress Notes (Signed)
NAME:  Steven Sellers, MRN:  [id number], DOB:  [birth year], LOS: 3 ADMISSION DATE:  10/27/2019, CONSULTATION DATE:  10/27/19  REFERRING MD:  EDP , CHIEF COMPLAINT:  resp distress/ acute hyperkalemia   Brief History   59 yo white male smoker esrf/ HD dep missed HD 10/22/19 and 10/25/19  and presented am 6/17 [facility] with sob and ventricular arrthmias and found to have severe hyperkalemia rx with CaCl2, bicarb, insulin and Dextrose and plans to start HD at 230 pm and PCCM service consulted re acute management and ? Need for transfer to [facility] with concern may need continuous HD thru the night not available at [facility]  History of present illness   ]  None directly from pt. From last admit: Admit date: 10/01/2019 Discharge date: 10/07/2019  Admitted From: Home Disposition: [facility]    History of present illness: 59 y.o.malepast medical history that includes limb-girdle muscular dystrophy, end-stage renal disease on dialysis Tuesday Thursday Saturday, PE in 2012, chronic A. fib not on anticoagulation, hypertension admitted May 22 with right femur fracture after mechanical fall at the dialysis center.  Hospital course:  Right distal femur fracture Patient presented following mechanical fall at hemodialysis. Imaging notable for distal right femur fracture. Patient underwent ORIF on 10/03/2019 by Dr. Doreatha Martin.  Patient is touchdown weightbearing status right lower extremity.  Continue pain control with Tylenol, Robaxin, oxycodone as needed.  Continue postoperative DVT prophylaxis with heparin every 8 hours x 21 days.  Will need 2-week follow-up with orthopedics, Dr. Doreatha Martin for repeat x-rays and suture removal.  Discharging to [facility].  A. fib with RVR Following surgical invention, patient developed A. fib with RVR in PACU, initially requiring Cardizem drip which was transitioned back tooralCardizem.  Continue Cardizem CD 240 mg p.o. daily.  Not on chronic anticoagulation due to recurrent  falls and patient declines due to ecchymosis.  ESRD on HD TTS Patient follows with outpatient nephrology and dialysis in [place].  Chronic diastolic congestive heart failure, compensated Hx of essential hypertension TTE 2017 with EF 40-98% with diastolic dysfunction. Volume managed with HD. Continue Cardizem.  Continue HD per nephrology  Limb-girdle muscular dystrophy/ baseline still walking with cane prior to fx Appears to be at baseline.  Continue therapy at rehab.  Tobacco use disorder Counseled on need for cessation  Anemia of chronic disease with likely component of postoperative blood loss anemia Hemoglobin stable, 7.6 at time of discharge. Continue Aranesp weekly with HD  Vitamin D deficiency drisdol 50,000 units PO q7 days and vitamin D3 2000 units p.o. daily  Discharge Diagnoses:  Principal Problem:   Closed displaced supracondylar fracture of distal end of right femur with intracondylar extension ([place]) Active Problems:   Essential hypertension   Muscle atrophy   Limb-girdle muscular dystrophy ([place])   Anemia   ESRD needing dialysis (HCC)   Chronic a-fib ----Declined Anti-coagulation due to Frequent Bruising   Atrial fibrillation with RVR ([place])    Past Medical History   MD/ limb-girdle complicated by falls > fx R femur  ORIF on 10/03/2019 by Dr. Doreatha Martin. ESRF CAF HBP  Significant Hospital Events   Extubated yesterday without difficulty.  Was able to tolerate hemodialysis via AV fistula.  On amiodarone infusion for atrial fibrillation which he has had intermittently in the past.  Eager to get out of bed.  Appetite is good.  Consults:  PCCM  Procedures:  Oral ET  6/17  L  Fem CVL by edp 6/17   Significant Diagnostic Tests:  Micro Data:  BC x 2 6/17 >>> Covid 19 PCR  6/17 neg   Antimicrobials:  cefemime  6/17  >>> Vancomycin 6/17 >>> Metronidazole 6/17  >>>   Scheduled Meds: . sodium chloride   Intravenous Once  . Chlorhexidine  Gluconate Cloth  6 each Topical Daily  . Chlorhexidine Gluconate Cloth  6 each Topical Q0600  . darbepoetin (ARANESP) injection - DIALYSIS  200 mcg Intravenous Q Sat-HD  . feeding supplement (PRO-STAT SUGAR FREE 64)  30 mL Per Tube Daily  . heparin  5,000 Units Subcutaneous Q8H  . insulin aspart  0-6 Units Subcutaneous Q4H  . mouth rinse  15 mL Mouth Rinse BID  . midodrine  5 mg Oral TID WC  . multivitamin  1 tablet Oral QHS  . nicotine  21 mg Transdermal Daily  . sevelamer carbonate  3.2 g Oral TID WC   Continuous Infusions: . sodium chloride    . sodium chloride    . amiodarone 30 mg/hr (10/30/19 1221)  . dextrose 50 mL/hr at 10/30/19 1000  . feeding supplement (VITAL 1.5 CAL) 50 mL/hr at 10/29/19 0552  . ferric gluconate (FERRLECIT/NULECIT) IV 250 mg (10/30/19 1003)   PRN Meds:.   Interim history/subjective:   Extubated yesterday without difficulty.  Was able to tolerate hemodialysis via AV fistula.  On amiodarone infusion for atrial fibrillation which he has had intermittently in the past.  Eager to get out of bed.  Appetite is good.  Objective   Blood pressure 135/70, pulse 80, temperature 97.9 F (36.6 C), temperature source Oral, resp. rate (!) 23, height 6\' 2"  (1.88 m), weight 65.1 kg, SpO2 94 %.        Intake/Output Summary (Last 24 hours) at 10/30/2019 1325 Last data filed at 10/30/2019 1000 Gross per 24 hour  Intake 2213.27 ml  Output 2335 ml  Net -121.73 ml   Filed Weights   10/27/19 2205 10/29/19 0500 10/30/19 0342  Weight: 64.8 kg 66.5 kg 65.1 kg    Examination: GEN: no acute distress more awake today HEENT: Extubated.  No skin breakdown. CV: RRR, ext warm PULM: Clear, no wheezing GI: Soft, hypoactive BS EXT: bruising over arms, RUE fistula CDI NEURO: Intact moves all limbs. PSYCH: Awake and alert and fully conversant. SKIN: bruising as above  Resolved Hospital Problem list     Assessment & Plan:   Symptomatic Hyperkalemia manifested by Vtach  vs. afib/aberrancy- improved with amiodarone and HD -No further therapy.  Will be treated with hemodialysis.  Was critically ill due to acute hypoxemic respiratory failure.  Tolerated extubation following SBT. -Incentive spirometry  Atrial fibrillation on amiodarone infusion.  Currently in sinus rhythm -Complete 24-hour infusion of amiodarone. -Observe overnight if remains in sinus rhythm can transfer to floor.  ESRD on HD- [facility] -On IHD today. -Remove temporary lines.  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 Pain/Anxiety/Delirium protocol (if indicated): No sedation required VAP protocol (if indicated): Incentive spirometry DVT prophylaxis: sub q hep GI prophylaxis not indicated Glucose control: Currently euglycemic on no therapy. Mobility: Progressive ambulation. Code Status: full code Family Communication: Patient updated. Dispo: Transfer to floor tomorrow if remains in sinus rhythm off amiodarone infusion.   Kipp Brood, MD [facility] ICU Physician [place]  Pager: [phone] Mobile: [phone] After hours: [phone].  10/30/2019, 1:25 PM

## 2019-10-30 NOTE — Progress Notes (Signed)
[facility] ROUNDING NOTE   Subjective:   59 year old gentleman with history end-stage renal disease Tuesday Thursday Saturday dialysis hypertension muscular dystrophy history of tobacco abuse.  Presents to [facility] with increasing shortness of breath.  His last dialysis treatment was Saturday, 10/22/2019.  He did not receive his dialysis treatment Tuesday, 10/25/2019.  He was found to be hyperkalemic with a potassium of 7.6.  He was emergently dialyzed.  He was also placed on the ventilator 10/27/2019 for his acute hypoxic respiratory failure.  He underwent dialysis 10/29/2019 with removal of   2.3 L he developed an atrial tachycardia with a heart rate of 150 postdialysis   Blood pressure 120/62 pulse 73 temperature 98.5 O2 sats 96% 2 L nasal cannula  Sodium 134 potassium 3.5 chloride 90 CO2 31 BUN 11 creatinine 2.47 glucose 113 calcium 8.7 phosphorus 2.9.  Hemoglobin 7.6.  Renvela 3.2 g 3 times daily, Aranesp 200 mcg every Saturday, midodrine 5 mg 3 times daily multivitamins 1 daily, IV ferric gluconate 250 mg started 10/29/2019  IV amiodarone IV D10 50 cc an hour    Objective:  Vital signs in last 24 hours:  Temp:  [97.5 F (36.4 C)-99.8 F (37.7 C)] 98.5 F (36.9 C) (06/20 0319) Pulse Rate:  [73-156] 86 (06/20 0600) Resp:  [16-27] 21 (06/20 0600) BP: (95-160)/(56-134) 128/69 (06/20 0600) SpO2:  [88 %-100 %] 94 % (06/20 0600) Weight:  [65.1 kg] 65.1 kg (06/20 0342)  Weight change: -1.4 kg Filed Weights   10/27/19 2205 10/29/19 0500 10/30/19 0342  Weight: 64.8 kg 66.5 kg 65.1 kg    Intake/Output: I/O last 3 completed shifts: In: 3936.5 [P.O.:220; I.V.:2218.9; NG/GT:1045.3; IV Piggyback:452.3] Out: 2335 [Other:2335]   Intake/Output this shift:  No intake/output data recorded.  General: adult male in bed intubated HEENT: NCAT Heart: S1S2 tachycardia Lungs: absent on left anteriorly ;reduce on right anteriorly Abdomen: soft/nd Extremities: 1+  edema left > right upper extremity; no pitting lower extremity edema Skin: no rash on extremities exposed  Neuro: on continuous sedation     Basic Metabolic Panel: Recent Labs  Lab 10/27/19 1224 10/27/19 1224 10/27/19 1227 10/27/19 1515 10/28/19 0421 10/28/19 0429 10/28/19 0429 10/28/19 1301 10/28/19 1802 10/29/19 0419 10/29/19 2033 10/30/19 0347  NA 138   < > 134*   < > 135 138  --   --   --  132* 133* 134*  K 7.9*   < > 7.6*   < > 4.4 4.5  --   --   --  5.0 3.4* 3.5  CL 96*   < > 105  --   --  97*  --   --   --  95* 90* 92*  CO2 22  --   --   --   --  26  --   --   --  24 30 31   GLUCOSE 92   < > 95  --   --  99  --   --   --  150* 129* 113*  BUN 80*   < > 70*  --   --  21*  --   --   --  27* 8 11  CREATININE 8.87*   < > 9.30*  --   --  4.01*  --   --   --  4.77* 2.01* 2.47*  CALCIUM 11.2*   < >  --   --   --  8.9   < >  --   --  8.9 8.7* 8.7*  MG  --   --   --   --   --  1.6*  --  1.7 1.7 2.0 1.8  --   PHOS  --   --   --   --   --   --   --  5.9* 5.7* 6.5* 2.9  --    < > = values in this interval not displayed.    Liver Function Tests: Recent Labs  Lab 10/27/19 1224 10/28/19 0429  AST 16 11*  ALT 7 6  ALKPHOS 110 73  BILITOT 1.5* 1.3*  PROT 6.6 5.1*  ALBUMIN 2.7* 2.2*   No results for input(s): LIPASE, AMYLASE in the last 168 hours. No results for input(s): AMMONIA in the last 168 hours.  CBC: Recent Labs  Lab 10/27/19 1224 10/27/19 1227 10/28/19 0421 10/28/19 0429 10/29/19 0419 10/29/19 1645 10/30/19 0347  WBC 18.7*  --   --  14.1* 6.5  --  6.5  NEUTROABS 17.2*  --   --  12.1* 5.2  --   --   HGB 9.0*   < > 7.5* 7.2* 6.1* 8.5* 7.6*  HCT 29.3*   < > 22.0* 23.7* 20.0* 26.2* 24.2*  MCV 102.8*  --   --  103.5* 102.6*  --  100.8*  PLT 473*  --   --  336 188  --  208   < > = values in this interval not displayed.    Cardiac Enzymes: No results for input(s): CKTOTAL, CKMB, CKMBINDEX, TROPONINI in the last 168 hours.  BNP: Invalid input(s):  POCBNP  CBG: Recent Labs  Lab 10/29/19 1148 10/29/19 1543 10/29/19 1931 10/29/19 2314 10/30/19 0318  GLUCAP 113* 121* 94 114* 99    Microbiology: Results for orders placed or performed during the hospital encounter of 10/27/19  SARS Coronavirus 2 by RT PCR (hospital order, performed in [facility] hospital lab) Nasopharyngeal Nasopharyngeal Swab     Status: None   Collection Time: 10/27/19 12:25 PM   Specimen: Nasopharyngeal Swab  Result Value Ref Range Status   SARS Coronavirus 2 NEGATIVE NEGATIVE Final    Comment: (NOTE) SARS-CoV-2 target nucleic acids are NOT DETECTED.  The SARS-CoV-2 RNA is generally detectable in upper and lower respiratory specimens during the acute phase of infection. The lowest concentration of SARS-CoV-2 viral copies this assay can detect is 250 copies / mL. A negative result does not preclude SARS-CoV-2 infection and should not be used as the sole basis for treatment or other patient management decisions.  A negative result may occur with improper specimen collection / handling, submission of specimen other than nasopharyngeal swab, presence of viral mutation(s) within the areas targeted by this assay, and inadequate number of viral copies (<250 copies / mL). A negative result must be combined with clinical observations, patient history, and epidemiological information.  Fact Sheet for Patients:   StrictlyIdeas.no  Fact Sheet for Healthcare Providers: BankingDealers.co.za  This test is not yet approved or  cleared by the [place] FDA and has been authorized for detection and/or diagnosis of SARS-CoV-2 by FDA under an Emergency Use Authorization (EUA).  This EUA will remain in effect (meaning this test can be used) for the duration of the COVID-19 declaration under Section 564(b)(1) of the Act, 21 U.S.C. section 360bbb-3(b)(1), unless the authorization is terminated or revoked  sooner.  Performed at [facility], [address], [place], [place] [address]   Blood culture (routine x 2)     Status: None (  Preliminary result)   Collection Time: 10/27/19  1:10 PM   Specimen: BLOOD  Result Value Ref Range Status   Specimen Description BLOOD LEFT FEMORAL CATH  Final   Special Requests   Final    BOTTLES DRAWN AEROBIC AND ANAEROBIC Blood Culture adequate volume   Culture   Final    NO GROWTH 2 DAYS Performed at [facility], [address] [place], [place] [address]    Report Status PENDING  Incomplete  MRSA PCR Screening     Status: Abnormal   Collection Time: 10/27/19 11:17 PM   Specimen: Nasal Mucosa; Nasopharyngeal  Result Value Ref Range Status   MRSA by PCR POSITIVE (A) NEGATIVE Final    Comment:        The GeneXpert MRSA Assay (FDA approved for NASAL specimens only), is one component of a comprehensive MRSA colonization surveillance program. It is not intended to diagnose MRSA infection nor to guide or monitor treatment for MRSA infections. RESULT CALLED TO, READ BACK BY AND VERIFIED WITH: J CRUISE RN 10/28/19 0101 JDW Performed at [facility] [address], [place], [place] [address]     Coagulation Studies: Recent Labs    10/27/19 1310  LABPROT 12.9  INR 1.0    Urinalysis: No results for input(s): COLORURINE, LABSPEC, PHURINE, GLUCOSEU, HGBUR, BILIRUBINUR, KETONESUR, PROTEINUR, UROBILINOGEN, NITRITE, LEUKOCYTESUR in the last 72 hours.  Invalid input(s): APPERANCEUR    Imaging: DG Chest Port 1 View  Result Date: 10/29/2019 CLINICAL DATA:  Acute Respiratory Failure EXAM: PORTABLE CHEST 1 VIEW COMPARISON:  10/28/2019 FINDINGS: Endotracheal tube, feeding tube and central venous line unchanged. LEFT effusion and atelectasis noted. No pneumothorax. No pulmonary edema. IMPRESSION: Stable support apparatus. LEFT effusion and atelectasis. Electronically Signed   By: Suzy Bouchard M.D.   On: 10/29/2019 06:20   DG CHEST PORT 1  VIEW  Result Date: 10/28/2019 CLINICAL DATA:  On mechanically assisted ventilation. Acute respiratory failure with hypoxemia. EXAM: PORTABLE CHEST 1 VIEW COMPARISON:  Chest x-ray dated 10/27/2019 FINDINGS: Endotracheal tube is 4 cm above the carina in position. NG tube has been exchanged for a feeding tube in the tip is below the diaphragm. New central venous catheter has been inserted and the tip is at the junction of innominate vein and superior vena cava just above the carina. No pneumothorax. There is persistent consolidation and/or atelectasis in the left lower lobe. Minimal atelectasis at the right lung base. IMPRESSION: Persistent consolidation and/or atelectasis in the left lower lobe. Electronically Signed   By: Lorriane Shire M.D.   On: 10/28/2019 16:29     Medications:   . sodium chloride    . sodium chloride    . amiodarone 30 mg/hr (10/30/19 0600)  . dextrose 50 mL/hr at 10/30/19 0600  . feeding supplement (VITAL 1.5 CAL) 50 mL/hr at 10/29/19 0552  . ferric gluconate (FERRLECIT/NULECIT) IV Stopped (10/29/19 1110)   . sodium chloride   Intravenous Once  . Chlorhexidine Gluconate Cloth  6 each Topical Daily  . Chlorhexidine Gluconate Cloth  6 each Topical Q0600  . darbepoetin (ARANESP) injection - DIALYSIS  200 mcg Intravenous Q Sat-HD  . feeding supplement (PRO-STAT SUGAR FREE 64)  30 mL Per Tube Daily  . heparin  5,000 Units Subcutaneous Q8H  . insulin aspart  0-6 Units Subcutaneous Q4H  . ipratropium-albuterol  3 mL Nebulization QID  . mouth rinse  15 mL Mouth Rinse BID  . midodrine  5 mg Per Tube TID WC  . multivitamin  1 tablet Per Tube QHS  . nicotine  21 mg Transdermal Daily  . sevelamer carbonate  3.2 g Per Tube TID WC   sodium chloride, sodium chloride, acetaminophen, heparin, lidocaine (PF), lidocaine-prilocaine, ondansetron (ZOFRAN) IV, pentafluoroprop-tetrafluoroeth, polyethylene glycol  Assessment/ Plan:  [facility] [address]  TTS schedule  Left AVF  3 hours  and 15 minutes BF 400  DF 800 2K/2.5 Ca EDW 65 kg (left at 64.5 kg on 6/12) epo 2400 units each tx  hectoral 0.5 mcg of hectoral venofer 50 mg weekly on Tuesdays     End-stage renal disease Tuesday Thursday Saturday dialysis.  Received dialysis 10/27/2019 in the setting of life-threatening hyperkalemia.  This seems to have resolved.  He now is intubated for respiratory failure.  Received dialysis 10/29/2019 with removal of 2.3 l ultrafiltration  Hypertension/volume patient now discontinued Levophed.  Acute hypoxic respiratory failure mechanical ventilation as per critical care medicine  Anemia low iron stores will add IV iron.  Darbepoetin administered every Saturday 200 mcg given 10/29/2019.  Metabolic bone disease we will continue binders once eating.  Holding Hectorol secondary to hypercalcemia  Access appreciate assistance of Dr. Siri Cole.  Left arm fistula apparently function for hemodialysis 10/29/2019.  Has IJ catheter placed by critical care medicine 10/28/2019.   LOS: 3 Sherril Croon @TODAY @7 :42 AM

## 2019-10-31 LAB — GLUCOSE, CAPILLARY
Glucose-Capillary: 106 mg/dL — ABNORMAL HIGH (ref 70–99)
Glucose-Capillary: 68 mg/dL — ABNORMAL LOW (ref 70–99)
Glucose-Capillary: 88 mg/dL (ref 70–99)
Glucose-Capillary: 89 mg/dL (ref 70–99)
Glucose-Capillary: 94 mg/dL (ref 70–99)
Glucose-Capillary: 94 mg/dL (ref 70–99)
Glucose-Capillary: 97 mg/dL (ref 70–99)
Glucose-Capillary: 98 mg/dL (ref 70–99)

## 2019-10-31 LAB — HEMOGLOBIN A1C
Hgb A1c MFr Bld: 4.4 % — ABNORMAL LOW (ref 4.8–5.6)
Mean Plasma Glucose: 80 mg/dL

## 2019-10-31 MED ORDER — HYDROCODONE-ACETAMINOPHEN 7.5-325 MG PO TABS
1.0000 | ORAL_TABLET | Freq: Four times a day (QID) | ORAL | Status: DC | PRN
  Administered 2019-10-31 – 2019-11-03 (×11): 1 via ORAL
  Filled 2019-10-31 (×6): qty 1
  Filled 2019-10-31: qty 2
  Filled 2019-10-31 (×4): qty 1

## 2019-10-31 NOTE — Progress Notes (Signed)
Physical Therapy Treatment Patient Details Name: Steven Sellers MRN: [id number] DOB: 1960/07/26 Today's Date: 10/31/2019    History of Present Illness 59 y.o. male past medical history that includes limb-girdle muscular dystrophy, end-stage renal disease on dialysis Tuesday Thursday Saturday, PE in 2012, chronic A. fib not on anticoagulation, hypertension and recent mechanical fall at HD center sustaining R femur fx, s/p ORIF 10/03/19, presented to [facility] with SOB and ventricular arrythmias.  Transferred to [facility] with concern that pt may need continuous HD not available a [facility].    PT Comments    Pt willing and eager to move.  Emphasis on warm up and assisting function of the UE's, transitions including bridging and scooting, sitting balance, lateral scoot transfers, sit to stand and standing tolerance, ending with standing pivot back to bed for transport.    Follow Up Recommendations  SNF;Supervision/Assistance - 24 hour     Equipment Recommendations  None recommended by PT;Other (comment) (TBA)    Recommendations for Other Services       Precautions / Restrictions Precautions Precautions: Fall Restrictions RLE Weight Bearing: Touchdown weight bearing Other Position/Activity Restrictions: pt states MD stated he could put some weight on right, but not verified.    Mobility  Bed Mobility Overal bed mobility: Needs Assistance Bed Mobility: Supine to Sit     Supine to sit: Mod assist;+2 for safety/equipment Sit to supine: +2 for physical assistance;Total assist   General bed mobility comments: min to slide legs, mod to bridge to EOB, Mod truncal assist to come up and forward to midline.  Transfers Overall transfer level: Needs assistance Equipment used: None Transfers: Sit to/from Stand;Lateral/Scoot Transfers;Stand Pivot Transfers Sit to Stand: Max assist;+2 physical assistance Stand pivot transfers: Mod assist;+2 physical assistance      Lateral/Scoot Transfers: Mod assist;+2  physical assistance (higher to lower surface) General transfer comment: cues for best positioning, assist for w/shifts, building scooting momentum.  assisting standing with both coming forward, boosting and bracing L Knee.  Ambulation/Gait             General Gait Details: unable   Stairs             Wheelchair Mobility    Modified Rankin (Stroke Patients Only)       Balance Overall balance assessment: Needs assistance Sitting-balance support: No upper extremity supported;Single extremity supported Sitting balance-Leahy Scale: Fair Sitting balance - Comments: can extend and sit upright without UE's, but uses the stability of UE's to shift weight to scoot.   Standing balance support: During functional activity Standing balance-Leahy Scale: Poor Standing balance comment: uses UE's as much as possible and requires significant external support.                            Cognition Arousal/Alertness: Awake/alert Behavior During Therapy: WFL for tasks assessed/performed Overall Cognitive Status: Within Functional Limits for tasks assessed                                        Exercises Other Exercises Other Exercises: warm up hip/knee ROM and bil UE AA/PROM    General Comments General comments (skin integrity, edema, etc.): vss      Pertinent Vitals/Pain Pain Assessment: Faces Faces Pain Scale: Hurts little more Pain Location: right leg surgical site. Pain Descriptors / Indicators: Discomfort;Grimacing Pain Intervention(s): Monitored during session  Home Living                      Prior Function            PT Goals (current goals can now be found in the care plan section) Acute Rehab PT Goals Patient Stated Goal: start mobilizing better as my leg gets better. PT Goal Formulation: With patient Time For Goal Achievement: 11/13/19 Potential to Achieve Goals: Fair Progress towards PT goals: Progressing toward  goals    Frequency    Min 3X/week      PT Plan Current plan remains appropriate    Co-evaluation PT/OT/SLP Co-Evaluation/Treatment: Yes Reason for Co-Treatment: For patient/therapist safety;To address functional/ADL transfers PT goals addressed during session: Mobility/safety with mobility OT goals addressed during session: ADL's and self-care;Strengthening/ROM      AM-PAC PT "6 Clicks" Mobility   Outcome Measure  Help needed turning from your back to your side while in a flat bed without using bedrails?: A Lot Help needed moving from lying on your back to sitting on the side of a flat bed without using bedrails?: A Lot Help needed moving to and from a bed to a chair (including a wheelchair)?: A Lot Help needed standing up from a chair using your arms (e.g., wheelchair or bedside chair)?: Total Help needed to walk in hospital room?: Total Help needed climbing 3-5 steps with a railing? : Total 6 Click Score: 9    End of Session   Activity Tolerance: Patient tolerated treatment well;Other (comment) Patient left: in bed;Other (comment) (to transport to 6N) Nurse Communication: Mobility status PT Visit Diagnosis: Other abnormalities of gait and mobility (R26.89);Muscle weakness (generalized) (M62.81) Pain - Right/Left: Right Pain - part of body: Leg     Time: 9833-8250 PT Time Calculation (min) (ACUTE ONLY): 40 min  Charges:  $Neuromuscular Re-education: 8-22 mins                     10/31/2019  Ginger Carne., PT Acute Rehabilitation Services [phone]  (pager) [phone]  (office)   Steven Sellers 10/31/2019, 5:33 PM

## 2019-10-31 NOTE — Progress Notes (Signed)
Renal Navigator provided update to patient's OP HD clinic/[facility].  Steven Sellers, [place] Renal Navigator [phone]

## 2019-10-31 NOTE — Evaluation (Signed)
Occupational Therapy Evaluation Patient Details Name: Steven Sellers MRN: [id number] DOB: 30-Aug-1960 Today's Date: 10/31/2019    History of Present Illness 59 y.o. male past medical history that includes limb-girdle muscular dystrophy, end-stage renal disease on dialysis Tuesday Thursday Saturday, PE in 2012, chronic A. fib not on anticoagulation, hypertension and recent mechanical fall at HD center sustaining R femur fx, s/p ORIF 10/03/19, presented to [facility] with SOB and ventricular arrythmias.  Transferred to [facility] with concern that pt may need continuous HD not available a AP.   Clinical Impression   This 59 y/o male presents with the above. Pt with recent admit and d/c to SNF due to R femur fx. Pt currently presenting with decreased standing balance, bil UE weakness and decreased activity tolerance. Pt currently requiring two person assist for safe completion of functional transfers, requiring at least maxA for LB ADL and modA for UB ADL given UE weakness. Pt pleasant and with good motivation to return to his PLOF. He will benefit from continued acute OT services and currently recommend return to SNF for further OT services after discharge to progress pt towards his PLOF.     Follow Up Recommendations  SNF;Supervision/Assistance - 24 hour    Equipment Recommendations  Other (comment) (TBD in next venue)           Precautions / Restrictions Precautions Precautions: Fall Restrictions RLE Weight Bearing: Touchdown weight bearing Other Position/Activity Restrictions: pt states MD stated he could put some weight on right, but not verified.      Mobility Bed Mobility Overal bed mobility: Needs Assistance Bed Mobility: Supine to Sit     Supine to sit: Mod assist;+2 for safety/equipment Sit to supine: +2 for physical assistance;Total assist   General bed mobility comments: min to slide legs, mod to bridge to EOB, Mod truncal assist to come up and forward to  midline.  Transfers Overall transfer level: Needs assistance Equipment used: None Transfers: Sit to/from Stand;Lateral/Scoot Transfers;Stand Pivot Transfers Sit to Stand: Max assist;+2 physical assistance Stand pivot transfers: Mod assist;+2 physical assistance      Lateral/Scoot Transfers: Mod assist;+2 physical assistance (higher to lower surface) General transfer comment: cues for best positioning, assist for w/shifts, building scooting momentum.  assisting standing with both coming forward, boosting and bracing L Knee.    Balance Overall balance assessment: Needs assistance Sitting-balance support: No upper extremity supported;Single extremity supported Sitting balance-Leahy Scale: Fair Sitting balance - Comments: can extend and sit upright without UE's, but uses the stability of UE's to shift weight to scoot.   Standing balance support: During functional activity Standing balance-Leahy Scale: Poor Standing balance comment: uses UE's as much as possible and requires significant external support.                           ADL either performed or assessed with clinical judgement   ADL Overall ADL's : Needs assistance/impaired Eating/Feeding: Minimal assistance;Sitting Eating/Feeding Details (indicate cue type and reason): given UE weakness  Grooming: Moderate assistance;Sitting   Upper Body Bathing: Moderate assistance;Sitting   Lower Body Bathing: Maximal assistance;+2 for physical assistance;Sitting/lateral leans;Sit to/from stand   Upper Body Dressing : Moderate assistance;Sitting   Lower Body Dressing: Maximal assistance;+2 for physical assistance;Sitting/lateral leans;Sit to/from stand   Toilet Transfer: Maximal assistance;+2 for physical assistance;Stand-pivot;Squat-pivot Toilet Transfer Details (indicate cue type and reason): simulated via transfer to/from recliner  Toileting- Clothing Manipulation and Hygiene: Maximal assistance;+2 for physical  assistance;Sit to/from stand;Sitting/lateral lean  Functional mobility during ADLs: Maximal assistance;Moderate assistance;+2 for physical assistance (squat and stand pivot )                           Pertinent Vitals/Pain Pain Assessment: Faces Faces Pain Scale: Hurts little more Pain Location: right leg surgical site. Pain Descriptors / Indicators: Discomfort;Grimacing Pain Intervention(s): Monitored during session;Repositioned     Hand Dominance Right   Extremity/Trunk Assessment Upper Extremity Assessment Upper Extremity Assessment: Generalized weakness;RUE deficits/detail;LUE deficits/detail RUE Deficits / Details: pt has shoulder shrug but no other active shoulder movement noted, elbow strength grossly 2/5 RUE Coordination: decreased fine motor;decreased gross motor LUE Deficits / Details: pt has shoulder shrug but no other active shoulder movement noted, elbow strength grossly 2/5 LUE Coordination: decreased fine motor;decreased gross motor   Lower Extremity Assessment Lower Extremity Assessment: Defer to PT evaluation       Communication Communication Communication: No difficulties   Cognition Arousal/Alertness: Awake/alert Behavior During Therapy: WFL for tasks assessed/performed Overall Cognitive Status: Impaired/Different from baseline Area of Impairment: Safety/judgement                         Safety/Judgement: Decreased awareness of safety;Decreased awareness of deficits     General Comments: pt aware of needing not to put weight through his RLE but continues to do so, requiring frequent cues to maintain   General Comments  VSS    Exercises Exercises: Other exercises Other Exercises Other Exercises: warm up hip/knee ROM and bil UE AA/PROM Other Exercises: elbow flexion/extension via gravity eliminated    Shoulder Instructions      Home Living Family/patient expects to be discharged to:: Skilled nursing facility                                         Prior Functioning/Environment Level of Independence: Needs assistance  Gait / Transfers Assistance Needed: prior to femur fx pt mobilizing with [facility] ADL's / Homemaking Assistance Needed: required assist             OT Problem List: Decreased strength;Decreased range of motion;Decreased activity tolerance;Impaired balance (sitting and/or standing);Decreased cognition;Decreased safety awareness;Decreased knowledge of use of DME or AE;Decreased knowledge of precautions;Pain;Impaired UE functional use      OT Treatment/Interventions: Self-care/ADL training;Therapeutic exercise;Neuromuscular education;Energy conservation;DME and/or AE instruction;Therapeutic activities;Cognitive remediation/compensation;Patient/family education;Balance training    OT Goals(Current goals can be found in the care plan section) Acute Rehab OT Goals Patient Stated Goal: start mobilizing better as my leg gets better. OT Goal Formulation: With patient Time For Goal Achievement: 11/14/19 Potential to Achieve Goals: Good  OT Frequency: Min 2X/week   Barriers to D/C:            Co-evaluation PT/OT/SLP Co-Evaluation/Treatment: Yes Reason for Co-Treatment: For patient/therapist safety;To address functional/ADL transfers PT goals addressed during session: Mobility/safety with mobility OT goals addressed during session: ADL's and self-care;Strengthening/ROM      AM-PAC OT "6 Clicks" Daily Activity     Outcome Measure Help from another person eating meals?: A Lot Help from another person taking care of personal grooming?: A Lot Help from another person toileting, which includes using toliet, bedpan, or urinal?: A Lot Help from another person bathing (including washing, rinsing, drying)?: A Lot Help from another person to put on and taking off regular upper body clothing?: A Lot Help from  another person to put on and taking off regular lower body clothing?: A Lot 6  Click Score: 12   End of Session Nurse Communication: Mobility status  Activity Tolerance: Patient tolerated treatment well Patient left: in bed;with call bell/phone within reach;Other (comment) (transported to new unit)  OT Visit Diagnosis: Other abnormalities of gait and mobility (R26.89);Muscle weakness (generalized) (M62.81)                Time: 0712-1975 OT Time Calculation (min): 40 min Charges:  OT General Charges $OT Visit: 1 Visit OT Evaluation $OT Eval Moderate Complexity: 1 Mod OT Treatments $Therapeutic Activity: 8-22 mins  Lou Cal, OT Acute Rehabilitation Services Pager [phone] Office [phone]   Raymondo Band 10/31/2019, 5:49 PM

## 2019-10-31 NOTE — Plan of Care (Signed)
  Problem: Education: Goal: Knowledge of General Education information will improve Description Including pain rating scale, medication(s)/side effects and non-pharmacologic comfort measures Outcome: Progressing   

## 2019-10-31 NOTE — TOC Initial Note (Signed)
Transition of Care [facility]) - Initial/Assessment Note    Patient Details  Name: Steven Sellers MRN: [id number] Date of Birth: April 13, 1961  Transition of [facility]) CM/SW Contact:    Sable Feil, LCSW Phone Number: 10/31/2019, 4:51 PM  Clinical Narrative:   CSW talked with wife by phone regarding patient's discharge plan. Mrs. Vondrak informed CSW that her husband has been to [facility] before and they were not pleased with the care he received while there. She requested that patient discharge to [facility]. When asked, Mrs. Hallowell responded that if patient unable to d/c to [facility], he will go to [facility]. Mrs. Strohmeier also indicated that her husband was discharged from [facility] last Tuesday and had been there approximately 18 days.                 Expected Discharge Plan: Skilled Nursing Facility Barriers to Discharge: Continued Medical Work up   Patient Goals and CMS Choice Patient states their goals for this hospitalization and ongoing recovery are:: Wife is requesting continued rehab at a skilled nursing facility CMS Medicare.gov Compare Post Acute Care list provided to:: Patient Represenative (must comment) (Wife provided CSW with her facility preference) Choice offered to / list presented to : Spouse (Mrs. Indelicato provided CSW with her facility preference)  Expected Discharge Plan and Services Expected Discharge Plan: [place] arrangements for the past 2 months: Single Family Home                                     Prior Living Arrangements/Services Living arrangements for the past 2 months: Single Family Home Lives with:: Spouse Patient language and need for interpreter reviewed:: No Do you feel safe going back to the place where you live?: No   Wife agreeable to continued SNF for patient  Need for Family Participation in Patient Care: Yes (Comment) Care giver support system in place?: Yes  (comment)   Criminal Activity/Legal Involvement Pertinent to Current Situation/Hospitalization: No - Comment as needed  Activities of Daily Living Home Assistive Devices/Equipment: Wheelchair ADL Screening (condition at time of admission) Patient's cognitive ability adequate to safely complete daily activities?: Yes Is the patient deaf or have difficulty hearing?: No Does the patient have difficulty seeing, even when wearing glasses/contacts?: No Does the patient have difficulty concentrating, remembering, or making decisions?: No Patient able to express need for assistance with ADLs?: Yes Does the patient have difficulty dressing or bathing?: Yes Independently performs ADLs?: No Does the patient have difficulty walking or climbing stairs?: Yes Weakness of Legs: Both Weakness of Arms/Hands: Both  Permission Sought/Granted Permission sought to share information with : Family Supports Permission granted to share information with : Yes, Verbal Permission Granted  Share Information with NAME: Steven Sellers     Permission granted to share info w Relationship: Spouse  Permission granted to share info w Contact Information: Mrs. Presti [phone]  Emotional Assessment Appearance:: Other (Comment Required (Talked with wife by phone) Attitude/Demeanor/Rapport:  (Have not visited with patient) Affect (typically observed):  (Have not visited with patient) Orientation: : Oriented to Self, Oriented to Place, Oriented to  Time, Oriented to Situation Alcohol / Substance Use: Tobacco Use, Alcohol Use, Illicit Drugs (Per H&P, patient smokes, drin ks approx. 24 alcoholic drinks per week and does not use illicit drugs) Psych Involvement: No (comment)  Admission  diagnosis:  Hyperkalemia [E87.5] Tachyarrhythmia [R00.0] Hypoxia [R09.02] Encounter for orogastric (OG) tube placement [Z46.59] Encounter for intubation [Z01.818] ESRD needing dialysis ([place]) [N18.6, Z99.2] Endotracheal tube present  [Z97.8] Patient Active Problem List   Diagnosis Date Noted  . Ventricular tachyarrhythmia ([place]) 10/27/2019  . Acute respiratory failure with hypoxemia ([place]) 10/27/2019  . Atrial fibrillation with RVR ([place]) 10/03/2019  . Closed fracture of right distal femur ([place]) 10/02/2019  . Closed displaced supracondylar fracture of distal end of right femur with intracondylar extension ([place]) 10/01/2019  . Chronic a-fib ----Declined Anti-coagulation due to Frequent Bruising 10/01/2019  . H/o Pulmonary embolism -2012 10/01/2019  . Atrial fibrillation, new onset ([place]) 02/26/2017  . First time seizure ([place])   . Left arm weakness 09/25/2016  . CHF (congestive heart failure) ([place]) 12/16/2015  . CKD (chronic kidney disease) requiring chronic dialysis ([place]) 12/16/2015  . SOB (shortness of breath) 12/16/2015  . Central venous catheter in place   . Hyponatremia 11/30/2015  . Anemia 11/30/2015  . ESRD needing dialysis ([place]) 11/30/2015  . Acute renal failure (ARF) ([place]) 11/30/2015  . Hyperkalemia 05/12/2015  . Limb-girdle muscular dystrophy ([place]) 05/02/2015  . Muscle atrophy 04/25/2015  . Arm weakness 04/25/2015  . Leg weakness 04/25/2015  . Dysesthesia 04/25/2015  . Essential hypertension 05/21/2010  . H/o PULMONARY EMBOLISM in 2012 05/21/2010  . PLEURAL EFFUSION 05/21/2010  . RENAL FAILURE, ACUTE 05/21/2010   PCP:  Sharilyn Sites, MD Pharmacy:   [facility] [address], [place] [address] [place] [address] [place] [address] Phone: [phone] Fax: [phone]     Social Determinants of Health (SDOH) Interventions  No SDOH interventions requested or needed at this time.  Readmission Risk Interventions No flowsheet data found.

## 2019-10-31 NOTE — Progress Notes (Signed)
NAME:  Steven Sellers, MRN:  [id number], DOB:  June 02, 1960, LOS: 4 ADMISSION DATE:  10/27/2019, CONSULTATION DATE:  10/27/19  REFERRING MD:  EDP , CHIEF COMPLAINT:  resp distress/ acute hyperkalemia   Brief History   59 yo white male smoker esrf/ HD dep missed HD 10/22/19 and 10/25/19  and presented am 6/17 [facility] with sob and ventricular arrthmias and found to have severe hyperkalemia rx with CaCl2, bicarb, insulin and Dextrose and plans to start HD at 230 pm and PCCM service consulted re acute management and ? Need for transfer to [facility] with concern may need continuous HD thru the night not available at [facility]  History of present illness   ]  None directly from pt. From last admit: Admit date: 10/01/2019 Discharge date: 10/07/2019  Admitted From: Home Disposition: [facility] SNF    History of present illness: 59 y.o.malepast medical history that includes limb-girdle muscular dystrophy, end-stage renal disease on dialysis Tuesday Thursday Saturday, PE in 2012, chronic A. fib not on anticoagulation, hypertension admitted May 22 with right femur fracture after mechanical fall at the dialysis center.  Hospital course:  Right distal femur fracture Patient presented following mechanical fall at hemodialysis. Imaging notable for distal right femur fracture. Patient underwent ORIF on 10/03/2019 by Dr. Doreatha Martin.  Patient is touchdown weightbearing status right lower extremity.  Continue pain control with Tylenol, Robaxin, oxycodone as needed.  Continue postoperative DVT prophylaxis with heparin every 8 hours x 21 days.  Will need 2-week follow-up with orthopedics, Dr. Doreatha Martin for repeat x-rays and suture removal.  Discharging to [facility].  A. fib with RVR Following surgical invention, patient developed A. fib with RVR in PACU, initially requiring Cardizem drip which was transitioned back tooralCardizem.  Continue Cardizem CD 240 mg p.o. daily.  Not on chronic anticoagulation due to recurrent  falls and patient declines due to ecchymosis.  ESRD on HD TTS Patient follows with outpatient nephrology and dialysis in [place].  Chronic diastolic congestive heart failure, compensated Hx of essential hypertension TTE 2017 with EF 52-84% with diastolic dysfunction. Volume managed with HD. Continue Cardizem.  Continue HD per nephrology  Limb-girdle muscular dystrophy/ baseline still walking with cane prior to fx Appears to be at baseline.  Continue therapy at rehab.  Tobacco use disorder Counseled on need for cessation  Anemia of chronic disease with likely component of postoperative blood loss anemia Hemoglobin stable, 7.6 at time of discharge. Continue Aranesp weekly with HD  Vitamin D deficiency drisdol 50,000 units PO q7 days and vitamin D3 2000 units p.o. daily  Discharge Diagnoses:  Principal Problem:   Closed displaced supracondylar fracture of distal end of right femur with intracondylar extension ([place]) Active Problems:   Essential hypertension   Muscle atrophy   Limb-girdle muscular dystrophy ([place])   Anemia   ESRD needing dialysis (H[facility])   Chronic a-fib ----Declined Anti-coagulation due to Frequent Bruising   Atrial fibrillation with RVR ([place])    Past Medical History   MD/ limb-girdle complicated by falls > fx R femur  ORIF on 10/03/2019 by Dr. Doreatha Martin. ESRF CAF HBP  Significant Hospital Events   Extubated 6/19 without difficulty.   Consults:  PCCM  Procedures:  Oral ET  6/17  L  Fem CVL by edp 6/17   Significant Diagnostic Tests:    Micro Data:  BC x 2 6/17-negative to date Covid 19 PCR  6/17 neg MRSA PCR positive  Antimicrobials:  cefemime  6/17 -6/18 Vancomycin 6/17  Metronidazole 6/17  Scheduled Meds: . sodium chloride   Intravenous Once  . Chlorhexidine Gluconate Cloth  6 each Topical Daily  . Chlorhexidine Gluconate Cloth  6 each Topical Q0600  . darbepoetin (ARANESP) injection - DIALYSIS  200 mcg Intravenous Q Sat-HD   . feeding supplement (PRO-STAT SUGAR FREE 64)  30 mL Per Tube Daily  . heparin  5,000 Units Subcutaneous Q8H  . insulin aspart  0-6 Units Subcutaneous Q4H  . mouth rinse  15 mL Mouth Rinse BID  . midodrine  5 mg Oral TID WC  . multivitamin  1 tablet Oral QHS  . nicotine  21 mg Transdermal Daily  . sevelamer carbonate  3.2 g Oral TID WC   Continuous Infusions: . sodium chloride    . sodium chloride    . amiodarone 30 mg/hr (10/30/19 1800)  . dextrose Stopped (10/30/19 1001)  . ferric gluconate (FERRLECIT/NULECIT) IV Stopped (10/30/19 1103)   PRN Meds:.   Interim history/subjective:   Extubated 6/19 without difficulty.  Was able to tolerate hemodialysis via AV fistula.  On amiodarone infusion for atrial fibrillation which he has had intermittently in the past.  Eager to get out of bed.  Appetite is good.  Objective   Blood pressure (!) 147/95, pulse 88, temperature 98 F (36.7 C), temperature source Oral, resp. rate 15, height 6\' 2"  (1.88 m), weight 65.1 kg, SpO2 100 %.        Intake/Output Summary (Last 24 hours) at 10/31/2019 0801 Last data filed at 10/30/2019 1800 Gross per 24 hour  Intake 523.61 ml  Output --  Net 523.61 ml   Filed Weights   10/27/19 2205 10/29/19 0500 10/30/19 0342  Weight: 64.8 kg 66.5 kg 65.1 kg    Examination: GEN: In no distress, having some pain HEENT: Extubated.  No skin breakdown. CV: RRR, ext warm PULM: Clear breath sounds GI: Soft, hypoactive BS EXT: bruising over arms, RUE fistula CDI NEURO: Intact moves all limbs. PSYCH: Awake and alert and fully conversant. SKIN: Bruising  Resolved Hospital Problem list     Assessment & Plan:   Symptomatic Hyperkalemia manifested by Vtach vs. afib/aberrancy- improved with amiodarone and HD -No further therapy.  Will be treated with hemodialysis. -We will continue to monitor clinically  Was critically ill due to acute hypoxemic respiratory failure.  Tolerated extubation following  SBT. -Incentive spirometry  Atrial fibrillation on amiodarone infusion.  Currently in sinus rhythm -Completed 24-hour infusion of amiodarone. -Sinus rhythm this morning  ESRD on HD- [facility] -On IHD today. -Remove temporary lines.  COPD-continue bronchodilators  Prior PE not on [facility]  Muscular dystrophy   SNF/LTACH resident   Best practice:  Diet: Diet as tolerated Pain/Anxiety/Delirium protocol (if indicated): Norco for pain VAP protocol (if indicated): Not indicated DVT prophylaxis: sub q hep GI prophylaxis not indicated Glucose control: Currently euglycemic on no therapy. Mobility: Progressive ambulation. Code Status: full code Family Communication: Patient updated. Dispo: We will transfer to medical floor, to [facility] hospitalist service  Sherrilyn Rist, MD [place] PCCM Pager: [phone]

## 2019-10-31 NOTE — Progress Notes (Signed)
Hypoglycemic Event  CBG: 68  Treatment: 4 oz juice/soda  Symptoms: None  Follow-up CBG: Time: 730 CBG Result: 97  Possible Reasons for Event: Unknown  Comments/MD notified: Will recheck in 15 minutes    Steven Sellers A

## 2019-10-31 NOTE — Progress Notes (Addendum)
[facility] Progress Note   Assessment/ Plan:   1. Hyperkalemia/ acute hypoxic RF: d/t missed dialysis.  Corrected 2.ESRD TTS [facility], will write orders for tomorrow 3. Anemia: Hgb 7.6, got aranesp with HD 6/19 4. CKD-MBD: binders 5. Nutrition: renal diet 6. Hypertension: expect to improve with UF 7.  Afib: s/p amio gtt 8.  Limb girdle muscular dystrophy   Subjective:    Seen in room.  NO complaints today.    Objective:   BP (!) 130/95    Pulse 88    Temp 97.9 F (36.6 C) (Oral)    Resp 18    Ht 6\' 2"  (1.88 m)    Wt 65.1 kg    SpO2 100%    BMI 18.43 kg/m   Physical Exam: Gen: NAD, sitting in bed CVS: RRR Resp: muffled bases Abd: soft Ext: no LE edema ACCESS: LUE AVF, looks like some dependent upper extremity edema  Labs: BMET Recent Labs  Lab 10/27/19 1224 10/27/19 1224 10/27/19 1227 10/27/19 1227 10/27/19 1515 10/27/19 2139 10/28/19 0421 10/28/19 0429 10/28/19 1301 10/28/19 1802 10/29/19 0419 10/29/19 2033 10/30/19 0347  NA 138   < > 134*  --   --  135 135 138  --   --  132* 133* 134*  K 7.9*   < > 7.6*   < > 4.5 3.7 4.4 4.5  --   --  5.0 3.4* 3.5  CL 96*  --  105  --   --   --   --  97*  --   --  95* 90* 92*  CO2 22  --   --   --   --   --   --  26  --   --  24 30 31   GLUCOSE 92  --  95  --   --   --   --  99  --   --  150* 129* 113*  BUN 80*  --  70*  --   --   --   --  21*  --   --  27* 8 11  CREATININE 8.87*  --  9.30*  --   --   --   --  4.01*  --   --  4.77* 2.01* 2.47*  CALCIUM 11.2*  --   --   --   --   --   --  8.9  --   --  8.9 8.7* 8.7*  PHOS  --   --   --   --   --   --   --   --  5.9* 5.7* 6.5* 2.9  --    < > = values in this interval not displayed.   CBC Recent Labs  Lab 10/27/19 1224 10/27/19 1227 10/28/19 0429 10/29/19 0419 10/29/19 1645 10/30/19 0347  WBC 18.7*  --  14.1* 6.5  --  6.5  NEUTROABS 17.2*  --  12.1* 5.2  --   --   HGB 9.0*   < > 7.2* 6.1* 8.5* 7.6*  HCT 29.3*   < > 23.7* 20.0* 26.2* 24.2*   MCV 102.8*  --  103.5* 102.6*  --  100.8*  PLT 473*  --  336 188  --  208   < > = values in this interval not displayed.      Medications:     sodium chloride   Intravenous Once   Chlorhexidine Gluconate Cloth  6 each Topical Daily   Chlorhexidine Gluconate  Cloth  6 each Topical Q0600   darbepoetin (ARANESP) injection - DIALYSIS  200 mcg Intravenous Q Sat-HD   feeding supplement (PRO-STAT SUGAR FREE 64)  30 mL Per Tube Daily   heparin  5,000 Units Subcutaneous Q8H   insulin aspart  0-6 Units Subcutaneous Q4H   mouth rinse  15 mL Mouth Rinse BID   midodrine  5 mg Oral TID WC   multivitamin  1 tablet Oral QHS   nicotine  21 mg Transdermal Daily   sevelamer carbonate  3.2 g Oral TID WC     Madelon Lips MD 10/31/2019, 2:03 PM

## 2019-11-01 ENCOUNTER — Encounter (HOSPITAL_COMMUNITY): Payer: Self-pay | Admitting: Internal Medicine

## 2019-11-01 DIAGNOSIS — N186 End stage renal disease: Secondary | ICD-10-CM

## 2019-11-01 DIAGNOSIS — R931 Abnormal findings on diagnostic imaging of heart and coronary circulation: Secondary | ICD-10-CM

## 2019-11-01 DIAGNOSIS — I471 Supraventricular tachycardia: Secondary | ICD-10-CM

## 2019-11-01 DIAGNOSIS — Z992 Dependence on renal dialysis: Secondary | ICD-10-CM

## 2019-11-01 DIAGNOSIS — R9431 Abnormal electrocardiogram [ECG] [EKG]: Secondary | ICD-10-CM

## 2019-11-01 DIAGNOSIS — I472 Ventricular tachycardia: Secondary | ICD-10-CM

## 2019-11-01 LAB — GLUCOSE, CAPILLARY
Glucose-Capillary: 144 mg/dL — ABNORMAL HIGH (ref 70–99)
Glucose-Capillary: 97 mg/dL (ref 70–99)
Glucose-Capillary: 99 mg/dL (ref 70–99)

## 2019-11-01 LAB — CULTURE, BLOOD (ROUTINE X 2)
Culture: NO GROWTH
Special Requests: ADEQUATE

## 2019-11-01 MED ORDER — INSULIN ASPART 100 UNIT/ML ~~LOC~~ SOLN: 0.0000 [IU] | Freq: Three times a day (TID) | SUBCUTANEOUS | Status: DC

## 2019-11-01 MED ORDER — POLYETHYLENE GLYCOL 3350 17 G PO PACK: 17.0000 g | PACK | Freq: Every day | ORAL | Status: DC | PRN

## 2019-11-01 MED ORDER — VITAMIN D 25 MCG (1000 UNIT) PO TABS
2000.0000 [IU] | ORAL_TABLET | Freq: Every day | ORAL | Status: DC
  Administered 2019-11-01 – 2019-11-03 (×3): 2000 [IU] via ORAL
  Filled 2019-11-01 (×3): qty 2

## 2019-11-01 MED ORDER — METHOCARBAMOL 500 MG PO TABS
500.0000 mg | ORAL_TABLET | Freq: Four times a day (QID) | ORAL | Status: DC | PRN
  Administered 2019-11-03: 500 mg via ORAL
  Filled 2019-11-01: qty 1

## 2019-11-01 MED ORDER — DILTIAZEM HCL ER COATED BEADS 240 MG PO CP24
240.0000 mg | ORAL_CAPSULE | Freq: Every day | ORAL | Status: DC
  Filled 2019-11-01: qty 1

## 2019-11-01 MED ORDER — LIDOCAINE-PRILOCAINE 2.5-2.5 % EX CREA: 1.0000 "application " | TOPICAL_CREAM | Freq: Once | CUTANEOUS | Status: DC

## 2019-11-01 MED ORDER — DOCUSATE SODIUM 100 MG PO CAPS
100.0000 mg | ORAL_CAPSULE | Freq: Two times a day (BID) | ORAL | Status: DC
  Administered 2019-11-01 – 2019-11-03 (×5): 100 mg via ORAL
  Filled 2019-11-01 (×5): qty 1

## 2019-11-01 MED ORDER — NEPRO/CARBSTEADY PO LIQD
237.0000 mL | Freq: Two times a day (BID) | ORAL | Status: DC
  Administered 2019-11-01 – 2019-11-02 (×3): 237 mL via ORAL

## 2019-11-01 MED ORDER — VITAMIN D (ERGOCALCIFEROL) 1.25 MG (50000 UNIT) PO CAPS
50000.0000 [IU] | ORAL_CAPSULE | ORAL | Status: DC
  Administered 2019-11-01: 50000 [IU] via ORAL
  Filled 2019-11-01: qty 1

## 2019-11-01 MED ORDER — TRAZODONE HCL 50 MG PO TABS
50.0000 mg | ORAL_TABLET | Freq: Every evening | ORAL | Status: DC | PRN
  Administered 2019-11-01 – 2019-11-02 (×2): 50 mg via ORAL
  Filled 2019-11-01 (×2): qty 1

## 2019-11-01 NOTE — Progress Notes (Signed)
[facility] Progress Note   Assessment/ Plan:   1. Hyperkalemia/ acute hypoxic RF: d/t missed dialysis.  Corrected. 2.ESRD TTS [facility] , on schedule here today 3. Anemia: Hgb 7.6, got aranesp with HD 6/19 4. CKD-MBD: binders 5. Nutrition: renal diet 6. Hypertension: expect to improve with UF 7.  Afib: s/p amio gtt 8.  Limb girdle muscular dystrophy 9.  Dispo: pending, OK to go from renal perspective   Subjective:    On HD.  NO complaints.     Objective:   BP (!) 149/84 (BP Location: Right Arm)    Pulse 90    Temp 98.4 F (36.9 C) (Oral)    Resp 17    Ht 6\' 2"  (1.88 m)    Wt 65.1 kg    SpO2 100%    BMI 18.43 kg/m   Physical Exam: Gen: NAD, sitting in bed CVS: RRR Resp: muffled bases Abd: soft Ext: no LE edema ACCESS: LUE AVF, looks like some dependent upper extremity edema  Labs: BMET Recent Labs  Lab 10/27/19 1224 10/27/19 1224 10/27/19 1227 10/27/19 1227 10/27/19 1515 10/27/19 2139 10/28/19 0421 10/28/19 0429 10/28/19 1301 10/28/19 1802 10/29/19 0419 10/29/19 2033 10/30/19 0347  NA 138   < > 134*  --   --  135 135 138  --   --  132* 133* 134*  K 7.9*   < > 7.6*   < > 4.5 3.7 4.4 4.5  --   --  5.0 3.4* 3.5  CL 96*  --  105  --   --   --   --  97*  --   --  95* 90* 92*  CO2 22  --   --   --   --   --   --  26  --   --  24 30 31   GLUCOSE 92  --  95  --   --   --   --  99  --   --  150* 129* 113*  BUN 80*  --  70*  --   --   --   --  21*  --   --  27* 8 11  CREATININE 8.87*  --  9.30*  --   --   --   --  4.01*  --   --  4.77* 2.01* 2.47*  CALCIUM 11.2*  --   --   --   --   --   --  8.9  --   --  8.9 8.7* 8.7*  PHOS  --   --   --   --   --   --   --   --  5.9* 5.7* 6.5* 2.9  --    < > = values in this interval not displayed.   CBC Recent Labs  Lab 10/27/19 1224 10/27/19 1227 10/28/19 0429 10/29/19 0419 10/29/19 1645 10/30/19 0347  WBC 18.7*  --  14.1* 6.5  --  6.5  NEUTROABS 17.2*  --  12.1* 5.2  --   --   HGB 9.0*   < >  7.2* 6.1* 8.5* 7.6*  HCT 29.3*   < > 23.7* 20.0* 26.2* 24.2*  MCV 102.8*  --  103.5* 102.6*  --  100.8*  PLT 473*  --  336 188  --  208   < > = values in this interval not displayed.      Medications:     sodium chloride   Intravenous Once  Chlorhexidine Gluconate Cloth  6 each Topical Daily   Chlorhexidine Gluconate Cloth  6 each Topical Q0600   darbepoetin (ARANESP) injection - DIALYSIS  200 mcg Intravenous Q Sat-HD   diltiazem  240 mg Oral Daily   docusate sodium  100 mg Oral BID   feeding supplement (PRO-STAT SUGAR FREE 64)  30 mL Per Tube Daily   heparin  5,000 Units Subcutaneous Q8H   insulin aspart  0-6 Units Subcutaneous Q4H   lidocaine-prilocaine  1 application Topical Once   mouth rinse  15 mL Mouth Rinse BID   midodrine  5 mg Oral TID WC   multivitamin  1 tablet Oral QHS   nicotine  21 mg Transdermal Daily   sevelamer carbonate  3.2 g Oral TID WC   Vitamin D (Ergocalciferol)  50,000 Units Oral Q7 days   Vitamin D3  2,000 Units Oral Daily     Madelon Lips MD 11/01/2019, 11:01 AM

## 2019-11-01 NOTE — Plan of Care (Signed)

## 2019-11-01 NOTE — Consult Note (Addendum)
Cardiology Consultation:   Patient ID: PINCHOS TOPEL MRN: [id number]; DOB: Nov 24, 1960  Admit date: 10/27/2019 Date of Consult: 11/01/2019  Primary Care Provider: Sharilyn Sites, MD [facility] Cardiologist: Rozann Lesches, MD  [place] Electrophysiologist:  None    Patient Profile:   Steven Sellers is a 59 y.o. male with a hx of remote PE (2011), HTN, anemia, ESRD on HD TTS (started HD 2017, prior AKI after contrast in 2011), limb girdle muscular dystrophy, alcohol use, tobacco use, normal coronary arteries in 2002, seizures, anemia of chronic disease, vitamin D deficiency who is being seen today for the evaluation of atrial fibrillation, elevated troponin at the request of Dr. Louanne Belton.  History of Present Illness:   He saw Dr. Harrington Challenger in 2017 for evaluation of near-syncope/possible syncope. Event monitor was unrevealing (did not have spells while wearing). 2D echo had shown normal LV function. He was seen again in 09/2016 in the hospital by Dr. Domenic Polite for mildly elevated troponin (0.44) in context of demand ischemia with parasthesias. In 02/2017 he was admitted with pneumonia and atrial fibrillation. He was started on anticoagulation but does not appear to have had cardiology follow-up since then. Talking with the patient and his wife, they do not specifically remember what happened with the Coumadin or why he stopped (or never started?) taking it. More recently he was admitted 09/2019 with fall and right distal femur fractur. Per notes, following surgical intervention, patient developed atrial fib with RVR requiring Cardizem. Note states "not on chronic anticoagulation due to recurrent falls and patient declines due to ecchymosis." His admit EKG appears to have shown NSR with PACs.  He presented to the hospital on Thursday 10/27/19 with SOB and hypoxia in setting of missing dialysis on Tuesday and that day. He had missed dialysis Tuesday because of a lift pad delivery issue and on  Thursday due to transportation problems. He was in respiratory distress at home with difficulty breathing, dry heaving, and diarrhea. He required intubation in the ED. He was reported to go into ventricular tachycardia in the ER - EKG showed wide complex tachycardia in context of hyperkalemia with K of 7.9 which required acute treatment with bicarb, insulin, calcium, and emergent HD. He was started on amiodarone in the ER. He was also started on empiric antibiotics in case of sepsis. He had hypotension requiring norepinephrine and midodrine. He also required a unit of blood for Hgb of 6.1. Amiodarone was discontinued. He was extubated 6/19. That same evening he went into atrial fib 150-170s so amiodarone was restarted for another day with conversion to NSR. Last labs showed Hgb 7.6. Admit troponins also showed troponins 113-166. 2D Echo 6/17 showed EF 16-96%, normal diastolic parameters, nodular calcification on the non-coronary cusp, cannot r/o vegetation, no ASA/AI.  His current telemetry shows NSR, but only originates from this afternoon on. QTC was prolonged on previous tracings but is now 449ms on tele. He is now hypertensive in the 160s/80s and pulse around 95-100 (sinus tach). He had diltiazem ordered today but appears to have declined per [facility].   Past Medical History:  Diagnosis Date  . Alcohol use   . Anemia of chronic disease   . Chest pain with normal coronary angiography 2002  . ESRD (end stage renal disease) ([place])    Hemodialysis TTHS- [facility] in [place]  . Falls   . Hypertension   . Limb-girdle muscular dystrophy ([place])   . PAF (paroxysmal atrial fibrillation) ([place])    diagnosed 2018  . Pulmonary embolism (  [place]) 2011  . Seizures ([place]) 09/2016  . Tobacco abuse   . Vision abnormalities   . Vitamin D deficiency     Past Surgical History:  Procedure Laterality Date  . A/V FISTULAGRAM Left 11/05/2016   Procedure: A/V Fistulagram;  Surgeon: Waynetta Sandy, MD;  Location:  [place] [facility];  Service: Cardiovascular;  Laterality: Left;  . A/V FISTULAGRAM Left 01/18/2018   Procedure: A/V FISTULAGRAM - left upper extremity;  Surgeon: Waynetta Sandy, MD;  Location: [place] [facility];  Service: Cardiovascular;  Laterality: Left;  . AV FISTULA PLACEMENT Left 12/26/2015   Procedure: CREATION OF LEFT RADIAL-CEPHALIC ARTERIOVENOUS FISTULA FOR HEMODIALYSIS;  Surgeon: Vickie Epley, MD;  Location: [facility];  Service: Vascular;  Laterality: Left;  . AV FISTULA PLACEMENT Left 04/28/2016   Procedure: REVISION OF LEFT RADIAL CEPHALIC ARTERIOVENOUS (AV) FISTULA  FOR DURABLE HEMODIALYSIS;  Surgeon: Vickie Epley, MD;  Location: [facility];  Service: Vascular;  Laterality: Left;  . CARDIAC SURGERY    . CENTRAL VENOUS CATHETER INSERTION Right 12/04/2015   Procedure: INSERTION OF TUNNELED HEMODIALYSIS CATHETER RIGHT INTERNAL JUGULAR;  Surgeon: Vickie Epley, MD;  Location: [facility];  Service: General;  Laterality: Right;  . IR REMOVAL TUN CV CATH W/O FL  12/10/2016  . ORIF FEMUR FRACTURE Right 10/03/2019   Procedure: OPEN REDUCTION INTERNAL FIXATION (ORIF) DISTAL FEMUR FRACTURE;  Surgeon: Shona Needles, MD;  Location: [place];  Service: Orthopedics;  Laterality: Right;  . PERIPHERAL VASCULAR BALLOON ANGIOPLASTY Left 01/18/2018   Procedure: PERIPHERAL VASCULAR BALLOON ANGIOPLASTY;  Surgeon: Waynetta Sandy, MD;  Location: [place] [facility];  Service: Cardiovascular;  Laterality: Left;  upper arm fistula  . REVISON OF ARTERIOVENOUS FISTULA Left 07/07/2016   Procedure: CREATION OF LEFT ARM BRACHIOCEPHALIC FISTULA;  Surgeon: Waynetta Sandy, MD;  Location: [place];  Service: Vascular;  Laterality: Left;     Home Medications:  Prior to Admission medications   Medication Sig Start Date End Date Taking? Authorizing Provider  methocarbamol (ROBAXIN) 500 MG tablet Take 1 tablet (500 mg total) by mouth every 6 (six) hours as needed for muscle spasms. 10/05/19  Yes  Delray Alt, PA-C  oxyCODONE (OXY IR/ROXICODONE) 5 MG immediate release tablet Take 1 tablet (5 mg total) by mouth every 6 (six) hours as needed for moderate pain or severe pain. 10/07/19  Yes [place] Donnamarie Poag, DO  acetaminophen (TYLENOL) 500 MG tablet Take 1,000 mg by mouth every 6 (six) hours as needed for mild pain or moderate pain. Patient not taking: Reported on 10/27/2019    [provider]  Cholecalciferol (VITAMIN D3) 50 MCG (2000 UT) capsule Take 1 capsule (2,000 Units total) by mouth daily. Patient not taking: Reported on 10/27/2019 10/07/19   [place] Donnamarie Poag, DO  Darbepoetin Alfa (ARANESP) 100 MCG/0.5ML SOSY injection Inject 0.5 mLs (100 mcg total) into the vein every Friday with hemodialysis. Patient not taking: Reported on 10/27/2019 10/07/19   [place] Donnamarie Poag, DO  diltiazem (CARDIZEM CD) 240 MG 24 hr capsule Take 1 capsule (240 mg total) by mouth daily. Patient not taking: Reported on 10/27/2019 10/08/19 11/07/19  [place] Donnamarie Poag, DO  docusate sodium (COLACE) 100 MG capsule Take 1 capsule (100 mg total) by mouth 2 (two) times daily. Patient not taking: Reported on 10/27/2019 10/07/19 11/06/19  [place] Donnamarie Poag, DO  lidocaine-prilocaine (EMLA) cream Apply 1 application topically once.  Patient not taking: Reported on 10/27/2019 01/06/17   [provider]  oxyCODONE-acetaminophen (  PERCOCET) 5-325 MG tablet Take 1 tablet by mouth every 4 (four) hours as needed for severe pain. Patient not taking: Reported on 10/27/2019 10/05/19   Delray Alt, PA-C  polyethylene glycol (MIRALAX / GLYCOLAX) 17 g packet Take 17 g by mouth daily as needed for mild constipation. Patient not taking: Reported on 10/27/2019 10/07/19   [place] Donnamarie Poag, DO  sevelamer carbonate (RENVELA) 0.8 g PACK packet Take 3.2 g by mouth 3 (three) times daily with meals.  Patient not taking: Reported on 10/27/2019 01/05/17   [provider]  traZODone (DESYREL) 50 MG tablet Take 1 tablet (50 mg total) by mouth at  bedtime as needed for sleep. Patient not taking: Reported on 10/27/2019 10/07/19 11/06/19  [place] Eric J, DO  Vitamin D, Ergocalciferol, (DRISDOL) 1.25 MG (50000 UNIT) CAPS capsule Take 1 capsule (50,000 Units total) by mouth every 7 (seven) days. Patient not taking: Reported on 10/27/2019 10/07/19   [place] Eric J, DO    Inpatient Medications: Scheduled Meds: . sodium chloride   Intravenous Once  . Chlorhexidine Gluconate Cloth  6 each Topical Daily  . Chlorhexidine Gluconate Cloth  6 each Topical Q0600  . cholecalciferol  2,000 Units Oral Daily  . darbepoetin (ARANESP) injection - DIALYSIS  200 mcg Intravenous Q Sat-HD  . diltiazem  240 mg Oral Daily  . docusate sodium  100 mg Oral BID  . feeding supplement (NEPRO CARB STEADY)  237 mL Oral BID BM  . heparin  5,000 Units Subcutaneous Q8H  . insulin aspart  0-6 Units Subcutaneous TID AC & HS  . lidocaine-prilocaine  1 application Topical Once  . mouth rinse  15 mL Mouth Rinse BID  . multivitamin  1 tablet Oral QHS  . nicotine  21 mg Transdermal Daily  . sevelamer carbonate  3.2 g Oral TID WC  . Vitamin D (Ergocalciferol)  50,000 Units Oral Q7 days   Continuous Infusions:  PRN Meds: acetaminophen, heparin, HYDROcodone-acetaminophen, ipratropium-albuterol, menthol-cetylpyridinium, methocarbamol, ondansetron (ZOFRAN) IV, polyethylene glycol, traZODone  Allergies:    Allergies  Allergen Reactions  . Other Other (See Comments)    IV contrast- Renal issues    Social History:   Social History   Socioeconomic History  . Marital status: Married    Spouse name: Not on file  . Number of children: Not on file  . Years of education: Not on file  . Highest education level: Not on file  Occupational History  . Not on file  Tobacco Use  . Smoking status: Current Every Day Smoker    Packs/day: 0.50    Years: 40.00    Pack years: 20.00    Types: Cigarettes    Start date: 05/12/1974  . Smokeless tobacco: Never Used  . Tobacco  comment: 1/2 pk per day  Vaping Use  . Vaping Use: Never used  Substance and Sexual Activity  . Alcohol use: No    Alcohol/week: 24.0 standard drinks    Types: 24 Standard drinks or equivalent per week    Comment: occ  . Drug use: No  . Sexual activity: Not on file  Other Topics Concern  . Not on file  Social History Narrative  . Not on file   Social Determinants of Health   Financial Resource Strain:   . Difficulty of Paying Living Expenses:   Food Insecurity:   . Worried About Charity fundraiser in the Last Year:   . Arboriculturist in the Last Year:   [employer]  Needs:   . Lack of Transportation (Medical):   Marland Kitchen Lack of Transportation (Non-Medical):   Physical Activity:   . Days of Exercise per Week:   . Minutes of Exercise per Session:   Stress:   . Feeling of Stress :   Social Connections:   . Frequency of Communication with Friends and Family:   . Frequency of Social Gatherings with Friends and Family:   . Attends Religious Services:   . Active Member of Clubs or Organizations:   . Attends Archivist Meetings:   Marland Kitchen Marital Status:   Intimate Partner Violence:   . Fear of Current or Ex-Partner:   . Emotionally Abused:   Marland Kitchen Physically Abused:   . Sexually Abused:     Family History:    Family History  Problem Relation Age of Onset  . Hypertension Mother   . Heart attack Father        d/o MI at 3 yo   . Cancer Neg Hx   . Kidney disease Neg Hx      ROS:  Please see the history of present illness.   All other ROS reviewed and negative.     Physical Exam/Data:   Vitals:   11/01/19 1130 11/01/19 1149 11/01/19 1225 11/01/19 1406  BP: (!) 167/77 120/81 (!) 146/92 (!) 161/85  Pulse: 95 (!) 101 100 93  Resp:  17 19 19   Temp:  98.1 F (36.7 C) 98.9 F (37.2 C) 98.7 F (37.1 C)  TempSrc:  Oral Oral Oral  SpO2:  100% 100% 100%  Weight:  59.2 kg    Height:        Intake/Output Summary (Last 24 hours) at 11/01/2019 1612 Last data filed at  11/01/2019 1407 Gross per 24 hour  Intake 250 ml  Output 3000 ml  Net -2750 ml   Last 3 Weights 11/01/2019 11/01/2019 10/30/2019  Weight (lbs) 130 lb 8.2 oz 143 lb 8.3 oz 143 lb 8.3 oz  Weight (kg) 59.2 kg 65.1 kg 65.1 kg     Body mass index is 16.76 kg/m.  General: Frail chronically ill appearing WM in no acute distress. Head: Normocephalic, atraumatic, sclera non-icteric, no xanthomas, nares are without discharge. Neck: Negative for carotid bruits. JVP not elevated. Lungs: Coarse bilaterally to auscultation without wheezes, rales, or rhonchi. Breathing is unlabored. Heart: RRR S1 S2 without murmurs, rubs, or gallops.  Abdomen: Soft, non-tender, non-distended with normoactive bowel sounds. No rebound/guarding. Extremities: No clubbing or cyanosis. LUE AVF with mild edema (has been evaluated by vascular team felt related to infiltration). Distal pedal pulses are 2+ and equal bilaterally. Scattered ecchymosis diffusely. Neuro: Alert and oriented X 3. Moves all extremities spontaneously. Psych:  Responds to questions appropriately with a normal affect.  EKG:  The EKG was personally reviewed and demonstrates:   1) 6/17 at 10:57 - possible sinus tach 102bpm with RBBB - wide complex, peaked T waves, diffuse STT changes 2) 6/17 at 11:38 - wide complex rhythm, 151bpm, regular, with RBBB, nonspecific STT changes 3) 6/19 at 6:23 - NSR 87bpm, QRS now narrowed to 1ms, Coved ST appearance V2-V4 with artifact -> f/u EKG appears similar, with diffuse nonspecific STT changes, QTc 520ms  Telemetry:  Telemetry was personally reviewed and demonstrates: His current telemetry shows NSR, but only originates from this afternoon on. QTC was prolonged on previous tracings but is now 442ms on tele.   Relevant CV Studies: 2D Echo 10/27/19 1. Left ventricular ejection fraction, by estimation, is 60 to 65%.  The  left ventricle has normal function. The left ventricle has no regional  wall motion abnormalities. The  left ventricular internal cavity size was  mildly dilated. There is severe left  ventricular hypertrophy. Left ventricular diastolic parameters were  normal.  2. Right ventricular systolic function is normal. The right ventricular  size is normal.  3. The mitral valve is normal in structure. No evidence of mitral valve  regurgitation. No evidence of mitral stenosis.  4. Nodular calcification near the non coronary cusp cannot r/o vegetation  on the ventricular side of valve no significant AR/AS . The aortic valve  is abnormal. Aortic valve regurgitation is not visualized. No aortic  stenosis is present.  5. The inferior vena cava is normal in size with greater than 50%  respiratory variability, suggesting right atrial pressure of 3 mmHg.   Laboratory Data:  High Sensitivity Troponin:   Recent Labs  Lab 10/27/19 1224 10/27/19 1415  TROPONINIHS 113* 166*     Chemistry Recent Labs  Lab 10/29/19 0419 10/29/19 2033 10/30/19 0347  NA 132* 133* 134*  K 5.0 3.4* 3.5  CL 95* 90* 92*  CO2 24 30 31   GLUCOSE 150* 129* 113*  BUN 27* 8 11  CREATININE 4.77* 2.01* 2.47*  CALCIUM 8.9 8.7* 8.7*  GFRNONAA 12* 35* 27*  GFRAA 14* 41* 32*  ANIONGAP 13 13 11     Recent Labs  Lab 10/27/19 1224 10/28/19 0429  PROT 6.6 5.1*  ALBUMIN 2.7* 2.2*  AST 16 11*  ALT 7 6  ALKPHOS 110 73  BILITOT 1.5* 1.3*   Hematology Recent Labs  Lab 10/28/19 0429 10/28/19 0429 10/29/19 0419 10/29/19 1645 10/30/19 0347  WBC 14.1*  --  6.5  --  6.5  RBC 2.29*  --  1.95*  --  2.40*  HGB 7.2*   < > 6.1* 8.5* 7.6*  HCT 23.7*   < > 20.0* 26.2* 24.2*  MCV 103.5*  --  102.6*  --  100.8*  MCH 31.4  --  31.3  --  31.7  MCHC 30.4  --  30.5  --  31.4  RDW 14.6  --  14.5  --  14.6  PLT 336  --  188  --  208   < > = values in this interval not displayed.   BNP Recent Labs  Lab 10/27/19 1310  BNP 635.0*    DDimer No results for input(s): DDIMER in the last 168 hours.   Radiology/Studies:  DG  Chest Port 1 View  Result Date: 10/29/2019 CLINICAL DATA:  Acute Respiratory Failure EXAM: PORTABLE CHEST 1 VIEW COMPARISON:  10/28/2019 FINDINGS: Endotracheal tube, feeding tube and central venous line unchanged. LEFT effusion and atelectasis noted. No pneumothorax. No pulmonary edema. IMPRESSION: Stable support apparatus. LEFT effusion and atelectasis. Electronically Signed   By: Suzy Bouchard M.D.   On: 10/29/2019 06:20    Assessment and Plan:   1. Life-threatening hypokalemia and acute hypoxic respiratory failure in the context of volume overload from missed dialysis/ESRD - improved after intubation, abx, medical treatment and dialysis.  2. Wide complex tachycardia - difficult to know if VT or atrial flutter, likely driven by severe hyperkalemia as he also had QRS widening at the time with RBBB type axis. Regardless, this occurred in the context of metabolic derangement and LVEF is normal so no specific further management needed at this time aside from adherence to HD.  3. Paroxysmal atrial fibrillation - initially diagnosed in 2018, supposed to be discharged on warfarin  but not clear what happened after that. Notes indicate recurrence perioperatively in 09/2019 and again this admission on telemetry. This is no longer available for review (current tele only starts 6/22 onward). Now that he has improved clinically, he is maintaining NSR/ST. CHADSVASC is 1 for HTN, possibly 2 if counting diastolic heart failure. However, he has numerous features that make him poor candidate for anticoagulation including falling, anemia requiring blood transfusion and elevated HASBLED score in context of ESRD. So far it also appears that his atrial fib has only occurred in the setting of acute illness (that we are aware of). Per discussion with Dr. Sallyanne Kuster, will hold off anticoagulation for now. We will plan a 14 day monitor and try to arrange placement of such before he is discharged (too late to place tonight). I  sent a message to our cardmaster to review in AM to arrange and also to get a 4 week follow-up in our [place] clinic. The patient refuses to take diltiazem due to prior adverse reaction of feeling distinctly globally terrible after taking this. We would suggest consideration of initiation of carvedilol if he remains hypertensive.  4. Possible aortic vegetation - blood cultures are negative, afebrile, WBC has normalized. Does not appear infected clinically but plan to review echo with Dr. Loletha Grayer - see his thoughts below.  5. Elevated troponin - relatively low/flat considering the degree of distress he presented with, suspect demand ischemia -  However, EKGs show some degree of unusual appearing ST elevation. Per discussion with Dr. Sallyanne Kuster, unusual appearance but may have been due to underlying metabolic issues.  6. QT prolongation - improved to 428ms on telemetry, likely driven by electrolyte abnormalities on admission. Can be monitored in OP setting.  For questions or updates, please contact [place] Please consult [email or web address] for contact info under    Signed, Charlie Pitter, PA-C  11/01/2019 4:12 PM  I have seen and examined the patient along with Charlie Pitter, PA-C .  I have reviewed the chart, notes and new data.  I agree with PA/NP's note.  Key new complaints: he has no current CV complaints and no problem with palpitations/arrhythmia outside of the critical illness. No persistent fever. No angina. No HF symptoms. He reports previous side effects with diltiazem ("sick as a dog"). Key examination changes: muscle wasting, pale, 1-2/6 early peaking Ao ejection murmur, ecchymoses over chest and limbs, swelling L upper extremity Key new findings / data: reviewed multiple ecg tracings, showing ST elevation due to "dialyzable current of injury" and peaked T waves of hyperkalemia, probable atrial flutter with 2:1 AV conduction versus atrial tachycardia with 1:1 AV conduction, gradually  shortening QT. Evo reviewed. I do not think there is a vegetation (tangential cut through a moderately sclerotic valve) and the diastolic function is not normal (cannot comment due to E-A fusion). Suspect some diastolic dysfunction with moderate LVH (not severe).  PLAN: - despite his comorbidities, his risk of embolic stroke is relatively low (CHADSVasc 1-2) and his bleeding risk is high. Overall, would not recommend restarting anticoagulants. - his arrhythmia appears to occur during serious noncardiac illness only. Maintenance antiarrhythmic therapy does not appear indicated. Will order an event monitor. - BP is a little high. Would suggest a low dose of beta blocker (e.g. carvedilol) as this can help with future tachyarrhythmia. Will defer starting this to the Nephrology team. - unclear which type of limb girdle muscular dystrophy he has, but no evidence of dilated CMP or of dangerous arrhythmia (except provoked  by major metabolic abnormalities) to date.  Sanda Klein, MD, [place] [phone] 11/01/2019, 5:31 PM

## 2019-11-01 NOTE — Progress Notes (Signed)
Pt returned to room 6N31 via bed after dialysis. Will continue to monitor.

## 2019-11-01 NOTE — Progress Notes (Signed)
PROGRESS NOTE  DALTON MOLESWORTH DVV:[id number] DOB: 1961-02-14 DOA: 10/27/2019 PCP: Sharilyn Sites, MD   LOS: 5 days   Brief narrative: As per HPI,  59 yo white male smoker with past medical history of end-stage renal disease on hemodialysis who missed hemodialysis on 10/22/19 and 10/25/19, limb-girdle muscular dystrophy, anemia, chronic atrial fibrillation, history of recent fall and right femur fracture status post surgery on 10/03/2019 presented to [facility]  with shortness of breath.  He was noted to have severe hyperkalemia and ventricular arrhythmia and was treated with temporizing measures including calcium gluconate, bicarbonate, insulin and dextrose.  Patient was then transferred to [facility] for hemodialysis which was not available at [facility].  Assessment/Plan:  Principal Problem:   Hyperkalemia Active Problems:   Essential hypertension   Limb-girdle muscular dystrophy ([place])   ESRD needing dialysis ([place])   Central venous catheter in place   Chronic a-fib ----Declined Anti-coagulation due to Frequent Bruising   H/o Pulmonary embolism -2012   Ventricular tachyarrhythmia ([place])   Acute respiratory failure with hypoxemia (HCC)  Hyperkalemia with V. tach/A. fib with aberrant conduction.  Treated with amiodarone drip and hemodialysis.  Currently improved.  Patient had potassium levels of up to 7.6 on presentation.  Potassium has improved at this time to 3.5.  Will discontinue midodrine the patient is on.   acute hypoxemic respiratory failure.    On presentation due to missed hemodialysis.  Patient was initially on 4 L of oxygen by EMS.  Subsequently, was intubated on 10/27/2019 in the ED.  Extubated 10/29/2019.  Continue Incentive spirometry.  Currently on room air   Atrial fibrillation with RVR on presentation, received amiodarone drip.  Patient is on Cardizem at home but I have been told that he was not taking it..  Will resume.  Patient has not had a  cardiology follow-up.  Will get cardiology evaluation at this time.  Patient would likely benefit from cardiology evaluation.  Discontinue midodrine.  Chronic diastolic congestive heart failure.  Compensated.  Last echo in 2017 with ejection fraction of 55 to 60%.  2D echocardiogram on 10/27/2019 with LV ejection fraction of 60 to 65% with no wall motion abnormality.  Volume managed with hemodialysis.  ESRD on HD- TTS schedule at [facility]. Continue hemodialysis while in hospital.  Follow nephrology recommendation.  COPD-continue bronchodilators, use Xopenex.  History of pulmonary embolism not on anticoagulation.  Muscular dystrophy.  Continue physical therapy on discharge.  Currently at the rehabilitation facility.  Nicotine use disorder.  On nicotine patch.  Anemia of chronic disease.  Likely secondary to end-stage renal disease.  He is on Aranesp weekly.  Leukocytosis.  Has normalized at this time.  Off antibiotic.  DVT prophylaxis: heparin injection 5,000 Units Start: 10/27/19 1745 SCDs Start: 10/27/19 1732    Code Status: Full code  Family Communication: none today.  Status is: Inpatient  Remains inpatient appropriate because:Inpatient level of care appropriate due to severity of illness and Hemodialysis, SNF resident   Dispo: The patient is from: SNF              Anticipated d/c is to: SNF              Anticipated d/c date is: 1-2 days,               Patient currently is not medically stable to d/c.   Consultants:  PCCM  Nephrology  Consult cardiology  Procedures: Intubation and mechanical ventilation on 10/27/2019 Left femoral  catheter on 10/27/2019 extubated 6/19  Hemodialysis  Antibiotics:  cefemime  6/17 -6/18 Vancomycin 6/17 discontinued Metronidazole 6/17 discontinued  Subjective: Today, patient was seen and examined at bedside.  Seen during hemodialysis.  Patient denies any chest pain, palpitation, dizziness nausea or  vomiting.  Objective: Vitals:   11/01/19 0725 11/01/19 0733  BP: (!) 169/71 (!) 187/59  Pulse: 94 84  Resp: 17   Temp: 98.4 F (36.9 C)   SpO2: 100%     Intake/Output Summary (Last 24 hours) at 11/01/2019 0756 Last data filed at 10/31/2019 1339 Gross per 24 hour  Intake 211.25 ml  Output --  Net 211.25 ml   Filed Weights   10/29/19 0500 10/30/19 0342 11/01/19 0725  Weight: 66.5 kg 65.1 kg 65.1 kg   Body mass index is 18.43 kg/m.   Physical Exam: GENERAL: Patient is alert awake and oriented. Not in obvious distress.  Thinly built HENT: No scleral pallor or icterus. Pupils equally reactive to light. Oral mucosa is moist NECK: is supple, no gross swelling noted. CHEST:  Diminished breath sounds bilaterally. CVS: S1 and S2 heard,  Regular rhythm at this time. ABDOMEN: Soft, non-tender, bowel sounds are present. EXTREMITIES: No edema.  Right upper extremity fistula with bruit. CNS: Cranial nerves are intact. No focal motor deficits. SKIN: warm and dry, bruises noted  Data Review: I have personally reviewed the following laboratory data and studies,  CBC: Recent Labs  Lab 10/27/19 1224 10/27/19 1227 10/28/19 0421 10/28/19 0429 10/29/19 0419 10/29/19 1645 10/30/19 0347  WBC 18.7*  --   --  14.1* 6.5  --  6.5  NEUTROABS 17.2*  --   --  12.1* 5.2  --   --   HGB 9.0*   < > 7.5* 7.2* 6.1* 8.5* 7.6*  HCT 29.3*   < > 22.0* 23.7* 20.0* 26.2* 24.2*  MCV 102.8*  --   --  103.5* 102.6*  --  100.8*  PLT 473*  --   --  336 188  --  208   < > = values in this interval not displayed.   Basic Metabolic Panel: Recent Labs  Lab 10/27/19 1224 10/27/19 1224 10/27/19 1227 10/27/19 1515 10/28/19 0421 10/28/19 0429 10/28/19 1301 10/28/19 1802 10/29/19 0419 10/29/19 2033 10/30/19 0347  NA 138   < > 134*   < > 135 138  --   --  132* 133* 134*  K 7.9*   < > 7.6*   < > 4.4 4.5  --   --  5.0 3.4* 3.5  CL 96*   < > 105  --   --  97*  --   --  95* 90* 92*  CO2 22  --   --   --    --  26  --   --  24 30 31   GLUCOSE 92   < > 95  --   --  99  --   --  150* 129* 113*  BUN 80*   < > 70*  --   --  21*  --   --  27* 8 11  CREATININE 8.87*   < > 9.30*  --   --  4.01*  --   --  4.77* 2.01* 2.47*  CALCIUM 11.2*  --   --   --   --  8.9  --   --  8.9 8.7* 8.7*  MG  --   --   --   --   --  1.6* 1.7 1.7 2.0 1.8  --   PHOS  --   --   --   --   --   --  5.9* 5.7* 6.5* 2.9  --    < > = values in this interval not displayed.   Liver Function Tests: Recent Labs  Lab 10/27/19 1224 10/28/19 0429  AST 16 11*  ALT 7 6  ALKPHOS 110 73  BILITOT 1.5* 1.3*  PROT 6.6 5.1*  ALBUMIN 2.7* 2.2*   No results for input(s): LIPASE, AMYLASE in the last 168 hours. No results for input(s): AMMONIA in the last 168 hours. Cardiac Enzymes: No results for input(s): CKTOTAL, CKMB, CKMBINDEX, TROPONINI in the last 168 hours. BNP (last 3 results) Recent Labs    10/27/19 1310  BNP 635.0*    ProBNP (last 3 results) No results for input(s): PROBNP in the last 8760 hours.  CBG: Recent Labs  Lab 10/31/19 1103 10/31/19 1529 10/31/19 1704 10/31/19 2009 11/01/19 0354  GLUCAP 94 98 89 94 144*   Recent Results (from the past 240 hour(s))  SARS Coronavirus 2 by RT PCR (hospital order, performed in [facility] hospital lab) Nasopharyngeal Nasopharyngeal Swab     Status: None   Collection Time: 10/27/19 12:25 PM   Specimen: Nasopharyngeal Swab  Result Value Ref Range Status   SARS Coronavirus 2 NEGATIVE NEGATIVE Final    Comment: (NOTE) SARS-CoV-2 target nucleic acids are NOT DETECTED.  The SARS-CoV-2 RNA is generally detectable in upper and lower respiratory specimens during the acute phase of infection. The lowest concentration of SARS-CoV-2 viral copies this assay can detect is 250 copies / mL. A negative result does not preclude SARS-CoV-2 infection and should not be used as the sole basis for treatment or other patient management decisions.  A negative result may occur with improper  specimen collection / handling, submission of specimen other than nasopharyngeal swab, presence of viral mutation(s) within the areas targeted by this assay, and inadequate number of viral copies (<250 copies / mL). A negative result must be combined with clinical observations, patient history, and epidemiological information.  Fact Sheet for Patients:   StrictlyIdeas.no  Fact Sheet for Healthcare Providers: BankingDealers.co.za  This test is not yet approved or  cleared by the [place] FDA and has been authorized for detection and/or diagnosis of SARS-CoV-2 by FDA under an Emergency Use Authorization (EUA).  This EUA will remain in effect (meaning this test can be used) for the duration of the COVID-19 declaration under Section 564(b)(1) of the Act, 21 U.S.C. section 360bbb-3(b)(1), unless the authorization is terminated or revoked sooner.  Performed at [facility], [address] [place], [place] [address]   Blood culture (routine x 2)     Status: None   Collection Time: 10/27/19  1:10 PM   Specimen: BLOOD  Result Value Ref Range Status   Specimen Description BLOOD LEFT FEMORAL CATH  Final   Special Requests   Final    BOTTLES DRAWN AEROBIC AND ANAEROBIC Blood Culture adequate volume   Culture   Final    NO GROWTH 5 DAYS Performed at [facility], [address] [place], [place] [address]    Report Status 11/01/2019 FINAL  Final  MRSA PCR Screening     Status: Abnormal   Collection Time: 10/27/19 11:17 PM   Specimen: Nasal Mucosa; Nasopharyngeal  Result Value Ref Range Status   MRSA by PCR POSITIVE (A) NEGATIVE Final    Comment:  The GeneXpert MRSA Assay (FDA approved for NASAL specimens only), is one component of a comprehensive MRSA colonization surveillance program. It is not intended to diagnose MRSA infection nor to guide or monitor treatment for MRSA infections. RESULT CALLED TO, READ BACK BY AND  VERIFIED WITH: J CRUISE RN 10/28/19 0101 JDW Performed at [facility] [address], [place], [place] [address]      Studies: No results found.    Flora Lipps, MD  [facility] 11/01/2019

## 2019-11-01 NOTE — Consult Note (Signed)
Consultation Note Date: 11/01/2019   Patient Name: Steven Sellers  DOB: 02-Jan-1961  MRN: [id number]  Age / Sex: 59 y.o., male  PCP: Sharilyn Sites, MD Referring Physician: Flora Lipps, MD  Reason for Consultation: Establish GOCs  HPI/Patient Profile: 59 y.o. male   admitted on 10/27/2019 with  past medical history of end-stage renal disease on hemodialysis who missed hemodialysis on 10/22/19 and 10/25/19, limb-girdle muscular dystrophy, anemia, chronic atrial fibrillation, history of recent fall and right femur fracture status post surgery on 10/03/2019 presented to [facility]  with shortness of breath.   He was noted to have severe hyperkalemia and ventricular arrhythmia and was treated with temporizing measures including calcium gluconate, bicarbonate, insulin and dextrose.  Patient was then transferred to [facility] for hemodialysis.  Cardiology consulted and work-up significant for tach arrhythmias/wide-complex tachycardia and paroxysmal atrial fib.  Patient with significant comorbidities, increasing care needs at home and is high risk for decompensation.  Patient and family face treatment option decisions, advanced directive decisions and anticipatory care needs.  Clinical Assessment and Goals of Care:  This NP Wadie Lessen reviewed medical records, received report from team, assessed the patient and then meet at the patient's bedside along with his wife to discuss diagnosis, prognosis, GOC, EOL wishes disposition and options.   Concept of Palliative Care was introduced as specialized medical care for people and their families living with serious illness.  If focuses on providing relief from the symptoms and stress of a serious illness.  The goal is to improve quality of life for both the patient and the family.   Education offered regarding advanced directives.  Concepts specific to  code status, artifical feeding and hydration, continued IV antibiotics and rehospitalization was had.  The difference between a aggressive medical intervention path  and a palliative comfort care path for this patient at this time was had.  Values and goals of care important to patient and family were attempted to be elicited.  Education offered regarding concept specific to failure to thrive and the limitations of medical interventions to prolong quality of life when the body does begin to fail to thrive.  Both patient and his wife recognize that the patient has had continued physical and functional decline and increasing care needs at home.   MOST form introduced    Questions and concerns addressed.  Patient  encouraged to call with questions or concerns.     PMT will continue to support holistically.            No documented healthcare power of attorney.  Encourage patient and his wife to secure documents while here in the hospital with the assistance of our spiritual care department. Patient does verbalize that in the event that he could not make decisions for himself his wife would be his main decision maker.    SUMMARY OF RECOMMENDATIONS    Code Status/Advance Care Planning:  Full code  Encouraged patient/family to consider DNR/DNI status understanding evidenced based poor outcomes in similar hospitalized patient,  as the cause of arrest is likely associated with advanced chronic illness rather than an easily reversible acute cardio-pulmonary event.    Palliative Prophylaxis:   Aspiration, Bowel Regimen, Delirium Protocol, Frequent Pain Assessment and Oral Care  Additional Recommendations (Limitations, Scope, Preferences):  Full Scope Treatment  Psycho-social/Spiritual:   Desire for further Chaplaincy support:no  Created space and opportunity for patient and his wife to explore the thoughts and feelings regarding patient's current medical situation.   Much of their  concerns involve the financial implications of chronic life limiting illness and the limited care options available to them.  I have notified TOC team for intervention.  Prognosis:   Unable to determine  Discharge Planning: To Be Determined      Primary Diagnoses: Present on Admission: . Hyperkalemia . Chronic a-fib ----Declined Anti-coagulation due to Frequent Bruising . Essential hypertension . Limb-girdle muscular dystrophy ([place]) . H/o Pulmonary embolism -2012 . Central venous catheter in place . Acute respiratory failure with hypoxemia ([place])   I have reviewed the medical record, interviewed the patient and family, and examined the patient. The following aspects are pertinent.  Past Medical History:  Diagnosis Date  . Alcohol use   . Anemia of chronic disease   . Chest pain with normal coronary angiography 2002  . ESRD (end stage renal disease) ([place])    Hemodialysis [facility]S- [facility] in [place]  . [place]   . Hypertension   . Limb-girdle muscular dystrophy ([place])   . PAF (paroxysmal atrial fibrillation) ([place])    diagnosed 2018  . Pulmonary embolism ([place]) 2011  . Seizures ([place]) 09/2016  . Tobacco abuse   . Vision abnormalities   . Vitamin D deficiency    Social History   Socioeconomic History  . Marital status: Married    Spouse name: Not on file  . Number of children: Not on file  . Years of education: Not on file  . Highest education level: Not on file  Occupational History  . Not on file  Tobacco Use  . Smoking status: Current Every Day Smoker    Packs/day: 0.50    Years: 40.00    Pack years: 20.00    Types: Cigarettes    Start date: 05/12/1974  . Smokeless tobacco: Never Used  . Tobacco comment: 1/2 pk per day  Vaping Use  . Vaping Use: Never used  Substance and Sexual Activity  . Alcohol use: No    Alcohol/week: 24.0 standard drinks    Types: 24 Standard drinks or equivalent per week    Comment: occ  . Drug use: No  . Sexual activity: Not on file    Other Topics Concern  . Not on file  Social History Narrative  . Not on file   Social Determinants of Health   Financial Resource Strain:   . Difficulty of Paying Living Expenses:   Food Insecurity:   . Worried About Charity fundraiser in the Last Year:   . Arboriculturist in the Last Year:   Transportation Needs:   . Film/video editor (Medical):   Marland Kitchen Lack of Transportation (Non-Medical):   Physical Activity:   . Days of Exercise per Week:   . Minutes of Exercise per Session:   Stress:   . Feeling of Stress :   Social Connections:   . Frequency of Communication with Friends and Family:   . Frequency of Social Gatherings with Friends and Family:   . Attends Religious Services:   . Active Member  of Clubs or Organizations:   . Attends Archivist Meetings:   Marland Kitchen Marital Status:    Family History  Problem Relation Age of Onset  . Hypertension Mother   . Heart attack Father        d/o MI at 93 yo   . Cancer Neg Hx   . Kidney disease Neg Hx    Scheduled Meds: . sodium chloride   Intravenous Once  . Chlorhexidine Gluconate Cloth  6 each Topical Daily  . Chlorhexidine Gluconate Cloth  6 each Topical Q0600  . cholecalciferol  2,000 Units Oral Daily  . darbepoetin (ARANESP) injection - DIALYSIS  200 mcg Intravenous Q Sat-HD  . docusate sodium  100 mg Oral BID  . feeding supplement (NEPRO CARB STEADY)  237 mL Oral BID BM  . heparin  5,000 Units Subcutaneous Q8H  . insulin aspart  0-6 Units Subcutaneous TID AC & HS  . lidocaine-prilocaine  1 application Topical Once  . mouth rinse  15 mL Mouth Rinse BID  . multivitamin  1 tablet Oral QHS  . nicotine  21 mg Transdermal Daily  . sevelamer carbonate  3.2 g Oral TID WC  . Vitamin D (Ergocalciferol)  50,000 Units Oral Q7 days   Continuous Infusions: PRN Meds:.acetaminophen, heparin, HYDROcodone-acetaminophen, ipratropium-albuterol, menthol-cetylpyridinium, methocarbamol, ondansetron (ZOFRAN) IV, polyethylene glycol,  traZODone Medications Prior to Admission:  Prior to Admission medications   Medication Sig Start Date End Date Taking? Authorizing Provider  methocarbamol (ROBAXIN) 500 MG tablet Take 1 tablet (500 mg total) by mouth every 6 (six) hours as needed for muscle spasms. 10/05/19  Yes Delray Alt, PA-C  oxyCODONE (OXY IR/ROXICODONE) 5 MG immediate release tablet Take 1 tablet (5 mg total) by mouth every 6 (six) hours as needed for moderate pain or severe pain. 10/07/19  Yes [place] Donnamarie Poag, DO  acetaminophen (TYLENOL) 500 MG tablet Take 1,000 mg by mouth every 6 (six) hours as needed for mild pain or moderate pain. Patient not taking: Reported on 10/27/2019    [provider]  Cholecalciferol (VITAMIN D3) 50 MCG (2000 UT) capsule Take 1 capsule (2,000 Units total) by mouth daily. Patient not taking: Reported on 10/27/2019 10/07/19   [place] Donnamarie Poag, DO  Darbepoetin Alfa (ARANESP) 100 MCG/0.5ML SOSY injection Inject 0.5 mLs (100 mcg total) into the vein every Friday with hemodialysis. Patient not taking: Reported on 10/27/2019 10/07/19   [place] Donnamarie Poag, DO  docusate sodium (COLACE) 100 MG capsule Take 1 capsule (100 mg total) by mouth 2 (two) times daily. Patient not taking: Reported on 10/27/2019 10/07/19 11/06/19  [place] Donnamarie Poag, DO  lidocaine-prilocaine (EMLA) cream Apply 1 application topically once.  Patient not taking: Reported on 10/27/2019 01/06/17   [provider]  oxyCODONE-acetaminophen (PERCOCET) 5-325 MG tablet Take 1 tablet by mouth every 4 (four) hours as needed for severe pain. Patient not taking: Reported on 10/27/2019 10/05/19   Delray Alt, PA-C  polyethylene glycol (MIRALAX / GLYCOLAX) 17 g packet Take 17 g by mouth daily as needed for mild constipation. Patient not taking: Reported on 10/27/2019 10/07/19   [place] Donnamarie Poag, DO  sevelamer carbonate (RENVELA) 0.8 g PACK packet Take 3.2 g by mouth 3 (three) times daily with meals.  Patient not taking: Reported on 10/27/2019  01/05/17   [provider]  traZODone (DESYREL) 50 MG tablet Take 1 tablet (50 mg total) by mouth at bedtime as needed for sleep. Patient not taking: Reported on 10/27/2019 10/07/19 11/06/19  [place]  Donnamarie Poag, DO  Vitamin D, Ergocalciferol, (DRISDOL) 1.25 MG (50000 UNIT) CAPS capsule Take 1 capsule (50,000 Units total) by mouth every 7 (seven) days. Patient not taking: Reported on 10/27/2019 10/07/19   [place] Eric J, DO   Allergies  Allergen Reactions  . Diltiazem     Patient recalls feeling horribly sick, presyncopal after taking  . Other Other (See Comments)    IV contrast- Renal issues   Review of Systems  Musculoskeletal: Positive for back pain.  Neurological: Positive for weakness.    Physical Exam Constitutional:      Appearance: He is cachectic. He is ill-appearing.  Pulmonary:     Effort: Pulmonary effort is normal.  Musculoskeletal:     Comments: Generalized weakness and muscle atrophy  Skin:    General: Skin is warm and dry.  Neurological:     Mental Status: He is alert.     Vital Signs: BP (!) 176/85 (BP Location: Right Arm)   Pulse 92   Temp 99 F (37.2 C) (Oral)   Resp 17   Ht 6\' 2"  (1.88 m)   Wt 59.2 kg   SpO2 100%   BMI 16.76 kg/m  Pain Scale: 0-10 POSS *See Group Information*: 1-Acceptable,Awake and alert Pain Score: 5    SpO2: SpO2: 100 % O2 Device:SpO2: 100 % O2 Flow Rate: .O2 Flow Rate (L/min): 2 L/min  IO: Intake/output summary:   Intake/Output Summary (Last 24 hours) at 11/01/2019 2012 Last data filed at 11/01/2019 1407 Gross per 24 hour  Intake 250 ml  Output 3000 ml  Net -2750 ml    LBM: Last BM Date:  (pta) Baseline Weight: Weight: 65 kg Most recent weight: Weight: 59.2 kg     Palliative Assessment/Data:  30 % at best   Discussed with Dr Louanne Belton  Time In: 1430 Time Out: 1545 Time Total: 75 minutes  Greater than 50%  of this time was spent counseling and coordinating care related to the above assessment and  plan.  Signed by: Wadie Lessen, NP   Please contact Palliative Medicine Team phone at [phone] for questions and concerns.  For individual provider: See Shea Evans

## 2019-11-01 NOTE — Progress Notes (Signed)
Nutrition Follow-up  DOCUMENTATION CODES:   Underweight  INTERVENTION:   -D/c Prostat -Nepro Shake po BID, each supplement provides 425 kcal and 19 grams protein -Renal MVI daily -Magic cup TID with meals, each supplement provides 290 kcal and 9 grams of protein  NUTRITION DIAGNOSIS:   Inadequate oral intake related to acute illness as evidenced by NPO status.  Progressing; advanced to PO diet on 10/29/19  GOAL:   Patient will meet greater than or equal to 90% of their needs  Progressing   MONITOR:   Vent status, Labs, Weight trends, TF tolerance  REASON FOR ASSESSMENT:   Consult, Ventilator Enteral/tube feeding initiation and management  ASSESSMENT:   59 yo male admitted to [facility] with SOB and ventricular arrhythmias and found to have severe hyperkalemia after missed HD x 2. PMH includes recent femur fracture s/p ORIF on 10/03/19, ESRD on HD, HTN, EtOH abuse, muscular dystrophy  6/17 Intubated, received iHD 6/18 Cortrak, TF initiated, HD catheter placed 6/19 extubated, HD catheter removed, advanced to diet  Reviewed I/O's: +331 ml x 24 hours and +2.1 L since admission  Per vascular surgery, pt using lt arm fistula for HD needs.   Pt out of room at time of visit. Unable to speak with pt at this time of complete nutirtion-focused physical exam. No family present at time of visit to provide additional history.   Pt with poor oral intake. Noted meal completion 20-50%. Pt would greatly benefit from oral nutrition supplements due to increased nutritional needs. Highly suspect malnutrition, however, unable to identify at this time.   Per chart review, plan to d/c to SNF at discharge.   Labs reviewed: CBGS: 89-144.   Diet Order:   Diet Order            Diet renal with fluid restriction Fluid restriction: 1200 mL Fluid; Room service appropriate? Yes; Fluid consistency: Thin  Diet effective now                 EDUCATION NEEDS:   Not appropriate for education  at this time  Skin:  Skin Assessment: Reviewed RN Assessment  Last BM:  Unknown  Height:   Ht Readings from Last 1 Encounters:  10/27/19 6\' 2"  (1.88 m)    Weight:   Wt Readings from Last 1 Encounters:  11/01/19 59.2 kg   BMI:  Body mass index is 16.76 kg/m.  Estimated Nutritional Needs:   Kcal:  2150-2350  Protein:  115-130 grams  Fluid:  1000 mL plus UOP    Loistine Chance, RD, LDN, [place] Registered Dietitian II Certified Diabetes Care and Education Specialist Please refer to [facility] for RD and/or RD on-call/weekend/after hours pager

## 2019-11-01 NOTE — Plan of Care (Signed)
  Problem: Education: Goal: Knowledge of General Education information will improve Description Including pain rating scale, medication(s)/side effects and non-pharmacologic comfort measures Outcome: Progressing   

## 2019-11-02 ENCOUNTER — Other Ambulatory Visit: Payer: Self-pay | Admitting: Medical

## 2019-11-02 DIAGNOSIS — R531 Weakness: Secondary | ICD-10-CM

## 2019-11-02 DIAGNOSIS — Z515 Encounter for palliative care: Secondary | ICD-10-CM

## 2019-11-02 DIAGNOSIS — I4892 Unspecified atrial flutter: Secondary | ICD-10-CM

## 2019-11-02 LAB — BASIC METABOLIC PANEL
Anion gap: 12 (ref 5–15)
BUN: 20 mg/dL (ref 6–20)
CO2: 28 mmol/L (ref 22–32)
Calcium: 9.2 mg/dL (ref 8.9–10.3)
Chloride: 98 mmol/L (ref 98–111)
Creatinine, Ser: 3.49 mg/dL — ABNORMAL HIGH (ref 0.61–1.24)
GFR calc Af Amer: 21 mL/min — ABNORMAL LOW (ref >60)
GFR calc non Af Amer: 18 mL/min — ABNORMAL LOW (ref >60)
Glucose, Bld: 101 mg/dL — ABNORMAL HIGH (ref 70–99)
Potassium: 3.3 mmol/L — ABNORMAL LOW (ref 3.5–5.1)
Sodium: 138 mmol/L (ref 135–145)

## 2019-11-02 LAB — GLUCOSE, CAPILLARY
Glucose-Capillary: 92 mg/dL (ref 70–99)
Glucose-Capillary: 88 mg/dL (ref 70–99)
Glucose-Capillary: 99 mg/dL (ref 70–99)
Glucose-Capillary: 88 mg/dL (ref 70–99)
Glucose-Capillary: 95 mg/dL (ref 70–99)

## 2019-11-02 LAB — CBC
HCT: 27.6 % — ABNORMAL LOW (ref 39.0–52.0)
Hemoglobin: 8.4 g/dL — ABNORMAL LOW (ref 13.0–17.0)
MCH: 31.5 pg (ref 26.0–34.0)
MCHC: 30.4 g/dL (ref 30.0–36.0)
MCV: 103.4 fL — ABNORMAL HIGH (ref 80.0–100.0)
Platelets: 271 10*3/uL (ref 150–400)
RBC: 2.67 MIL/uL — ABNORMAL LOW (ref 4.22–5.81)
RDW: 14.5 % (ref 11.5–15.5)
WBC: 8.5 10*3/uL (ref 4.0–10.5)
nRBC: 0.2 % (ref 0.0–0.2)

## 2019-11-02 LAB — PHOSPHORUS: Phosphorus: 3 mg/dL (ref 2.5–4.6)

## 2019-11-02 LAB — MAGNESIUM: Magnesium: 2.3 mg/dL (ref 1.7–2.4)

## 2019-11-02 MED ORDER — POTASSIUM CHLORIDE CRYS ER 20 MEQ PO TBCR
40.0000 meq | EXTENDED_RELEASE_TABLET | Freq: Once | ORAL | Status: AC
  Administered 2019-11-02: 40 meq via ORAL
  Filled 2019-11-02: qty 2

## 2019-11-02 MED ORDER — CARVEDILOL 3.125 MG PO TABS
3.1250 mg | ORAL_TABLET | Freq: Two times a day (BID) | ORAL | Status: DC
  Administered 2019-11-03: 3.125 mg via ORAL
  Filled 2019-11-02: qty 1

## 2019-11-02 MED ORDER — MUPIROCIN 2 % EX OINT
1.0000 "application " | TOPICAL_OINTMENT | Freq: Two times a day (BID) | CUTANEOUS | Status: DC
  Administered 2019-11-02 – 2019-11-03 (×4): 1 via NASAL
  Filled 2019-11-02: qty 22

## 2019-11-02 NOTE — Progress Notes (Signed)
PROGRESS NOTE  Steven Sellers OMV:[id number] DOB: Sep 21, 1960 DOA: 10/27/2019 PCP: Sharilyn Sites, MD   LOS: 6 days   Brief narrative: As per HPI,  59 yo white male smoker with past medical history of end-stage renal disease on hemodialysis who missed hemodialysis on 10/22/19 and 10/25/19, limb-girdle muscular dystrophy, anemia, chronic atrial fibrillation, history of recent fall and right femur fracture status post surgery on 10/03/2019 presented to [facility]  with shortness of breath.  He was noted to have severe hyperkalemia and ventricular arrhythmia and was treated with temporizing measures including calcium gluconate, bicarbonate, insulin and dextrose.  Patient was then transferred to [facility] for hemodialysis which was not available at [facility].  Assessment/Plan:  Principal Problem:   Hyperkalemia Active Problems:   Essential hypertension   Limb-girdle muscular dystrophy ([place])   ESRD needing dialysis ([place])   Central venous catheter in place   Chronic a-fib ----Declined Anti-coagulation due to Frequent Bruising   H/o Pulmonary embolism -2012   Ventricular tachyarrhythmia ([place])   Acute respiratory failure with hypoxemia (H[facility])  Hyperkalemia with V. tach/A. fib with aberrant conduction.  Treated with amiodarone drip and hemodialysis.  Currently improved.  Patient had potassium levels of up to 7.6 on presentation.  Potassium improved subsequently.  Will discontinue midodrine the patient is on.  Cardiology has been consulted.  We will check BMP from today.   acute hypoxemic respiratory failure.    On presentation due to missed hemodialysis.  Patient was initially on 4 L of oxygen by EMS.  Subsequently, was intubated on 10/27/2019 in the ED.  Extubated 10/29/2019.  Continue Incentive spirometry.  Currently on room air   Atrial fibrillation with RVR on presentation, received amiodarone drip.  Sinus with PVCs.  Med rec started Cardizem at home but will use not  taking.  Has been discontinued.  Of midodrine.  Cardiology on board.  Holding off with anticoagulation at this time as per cardiology recommendation..  Patient will likely need outpatient monitor on discharge andfollow-up with cardiology clinic at the [facility] clinic.  Cardiology recommends Coreg at this time.. Check labs from today.  Chronic diastolic congestive heart failure.  Compensated.  Last echo in 2017 with ejection fraction of 55 to 60%.  2D echocardiogram on 10/27/2019 with LV ejection fraction of 60 to 65% with no wall motion abnormality.  Volume managed with hemodialysis.  ESRD on HD- TTS schedule at [facility]. Continue hemodialysis while in hospital.  Follow nephrology recommendation.  COPD-continue bronchodilators, use Xopenex.  History of pulmonary embolism not on anticoagulation.  Muscular dystrophy.  Continue physical therapy on discharge.  Currently at the rehabilitation facility.  Nicotine use disorder.  On nicotine patch.  Continue.  Anemia of chronic disease.  Likely secondary to end-stage renal disease.  He is on Aranesp weekly.  Check CBC today  Leukocytosis.  Has normalized at this time.  Off antibiotic.  Check CBC.  DVT prophylaxis: heparin injection 5,000 Units Start: 10/27/19 1745 SCDs Start: 10/27/19 1732  Code Status: Full code  Family Communication: None today  Status is: Inpatient  Remains inpatient appropriate because:Inpatient level of care appropriate due to severity of illness and Hemodialysis, SNF resident   Dispo: The patient is from: SNF              Anticipated d/c is to: SNF              Anticipated d/c date is: 1-2 days, awaiting for skilled nursing facility placement  Patient currently is medically stable for disposition.   Consultants:  [facility]  Nephrology  Cardiology  Palliative care  Procedures: Intubation and mechanical ventilation on 10/27/2019 Left femoral catheter on 10/27/2019 extubated 6/19    Hemodialysis  Antibiotics:  cefemime  6/17 -6/18 Vancomycin 6/17 discontinued Metronidazole 6/17 discontinued  Subjective: Today, patient was seen and examined at bedside.  Denies chest pain, shortness of breath, fever or chills.  Objective: Vitals:   11/01/19 2006 11/02/19 0518  BP: (!) 176/85 (!) 160/77  Pulse: 92 86  Resp: 17 19  Temp: 99 F (37.2 C) 98.2 F (36.8 C)  SpO2: 100% 100%    Intake/Output Summary (Last 24 hours) at 11/02/2019 0758 Last data filed at 11/01/2019 1407 Gross per 24 hour  Intake 250 ml  Output 3000 ml  Net -2750 ml   Filed Weights   10/30/19 0342 11/01/19 0725 11/01/19 1149  Weight: 65.1 kg 65.1 kg 59.2 kg   Body mass index is 16.76 kg/m.   Physical Exam: GENERAL: Patient is alert, awake and oriented. Not in obvious distress.  Thinly built HENT: No scleral pallor or icterus. Pupils equally reactive to light. Oral mucosa is moist NECK: is supple, no gross swelling noted.  CHEST:  Diminished breath sounds bilaterally.  No wheezes or crackles CVS: S1 and S2 heard,  Regular rhythm at this time. ABDOMEN: Soft, non-tender, bowel sounds are present. EXTREMITIES: No edema.  Right upper extremity fistula with bruit. CNS: Cranial nerves are intact. No focal motor deficits. SKIN: warm and dry, bruises noted   Data Review: I have personally reviewed the following laboratory data and studies,  CBC: Recent Labs  Lab 10/27/19 1224 10/27/19 1227 10/28/19 0421 10/28/19 0429 10/29/19 0419 10/29/19 1645 10/30/19 0347  WBC 18.7*  --   --  14.1* 6.5  --  6.5  NEUTROABS 17.2*  --   --  12.1* 5.2  --   --   HGB 9.0*   < > 7.5* 7.2* 6.1* 8.5* 7.6*  HCT 29.3*   < > 22.0* 23.7* 20.0* 26.2* 24.2*  MCV 102.8*  --   --  103.5* 102.6*  --  100.8*  PLT 473*  --   --  336 188  --  208   < > = values in this interval not displayed.   Basic Metabolic Panel: Recent Labs  Lab 10/27/19 1224 10/27/19 1224 10/27/19 1227 10/27/19 1515 10/28/19 0421  10/28/19 0429 10/28/19 1301 10/28/19 1802 10/29/19 0419 10/29/19 2033 10/30/19 0347  NA 138   < > 134*   < > 135 138  --   --  132* 133* 134*  K 7.9*   < > 7.6*   < > 4.4 4.5  --   --  5.0 3.4* 3.5  CL 96*   < > 105  --   --  97*  --   --  95* 90* 92*  CO2 22  --   --   --   --  26  --   --  24 30 31   GLUCOSE 92   < > 95  --   --  99  --   --  150* 129* 113*  BUN 80*   < > 70*  --   --  21*  --   --  27* 8 11  CREATININE 8.87*   < > 9.30*  --   --  4.01*  --   --  4.77* 2.01* 2.47*  CALCIUM 11.2*  --   --   --   --  8.9  --   --  8.9 8.7* 8.7*  MG  --   --   --   --   --  1.6* 1.7 1.7 2.0 1.8  --   PHOS  --   --   --   --   --   --  5.9* 5.7* 6.5* 2.9  --    < > = values in this interval not displayed.   Liver Function Tests: Recent Labs  Lab 10/27/19 1224 10/28/19 0429  AST 16 11*  ALT 7 6  ALKPHOS 110 73  BILITOT 1.5* 1.3*  PROT 6.6 5.1*  ALBUMIN 2.7* 2.2*   No results for input(s): LIPASE, AMYLASE in the last 168 hours. No results for input(s): AMMONIA in the last 168 hours. Cardiac Enzymes: No results for input(s): CKTOTAL, CKMB, CKMBINDEX, TROPONINI in the last 168 hours. BNP (last 3 results) Recent Labs    10/27/19 1310  BNP 635.0*    ProBNP (last 3 results) No results for input(s): PROBNP in the last 8760 hours.  CBG: Recent Labs  Lab 10/31/19 2009 11/01/19 0354 11/01/19 1231 11/01/19 1639 11/02/19 0735  GLUCAP 94 144* 97 99 88   Recent Results (from the past 240 hour(s))  SARS Coronavirus 2 by RT PCR (hospital order, performed in [facility] hospital lab) Nasopharyngeal Nasopharyngeal Swab     Status: None   Collection Time: 10/27/19 12:25 PM   Specimen: Nasopharyngeal Swab  Result Value Ref Range Status   SARS Coronavirus 2 NEGATIVE NEGATIVE Final    Comment: (NOTE) SARS-CoV-2 target nucleic acids are NOT DETECTED.  The SARS-CoV-2 RNA is generally detectable in upper and lower respiratory specimens during the acute phase of infection. The  lowest concentration of SARS-CoV-2 viral copies this assay can detect is 250 copies / mL. A negative result does not preclude SARS-CoV-2 infection and should not be used as the sole basis for treatment or other patient management decisions.  A negative result may occur with improper specimen collection / handling, submission of specimen other than nasopharyngeal swab, presence of viral mutation(s) within the areas targeted by this assay, and inadequate number of viral copies (<250 copies / mL). A negative result must be combined with clinical observations, patient history, and epidemiological information.  Fact Sheet for Patients:   StrictlyIdeas.no  Fact Sheet for Healthcare Providers: BankingDealers.co.za  This test is not yet approved or  cleared by the [place] FDA and has been authorized for detection and/or diagnosis of SARS-CoV-2 by FDA under an Emergency Use Authorization (EUA).  This EUA will remain in effect (meaning this test can be used) for the duration of the COVID-19 declaration under Section 564(b)(1) of the Act, 21 U.S.C. section 360bbb-3(b)(1), unless the authorization is terminated or revoked sooner.  Performed at [facility], [address], [place], [place] [address]   Blood culture (routine x 2)     Status: None   Collection Time: 10/27/19  1:10 PM   Specimen: BLOOD  Result Value Ref Range Status   Specimen Description BLOOD LEFT FEMORAL CATH  Final   Special Requests   Final    BOTTLES DRAWN AEROBIC AND ANAEROBIC Blood Culture adequate volume   Culture   Final    NO GROWTH 5 DAYS Performed at [facility], [address] [place], [place] [address]    Report Status 11/01/2019 FINAL  Final  MRSA PCR Screening     Status: Abnormal   Collection Time: 10/27/19 11:17 PM   Specimen: Nasal Mucosa;  Nasopharyngeal  Result Value Ref Range Status   MRSA by PCR POSITIVE (A) NEGATIVE Final    Comment:        The  GeneXpert MRSA Assay (FDA approved for NASAL specimens only), is one component of a comprehensive MRSA colonization surveillance program. It is not intended to diagnose MRSA infection nor to guide or monitor treatment for MRSA infections. RESULT CALLED TO, READ BACK BY AND VERIFIED WITH: J CRUISE RN 10/28/19 0101 JDW Performed at [place] [address], [place], [place] [address]      Studies: No results found.    Flora Lipps, MD  [facility] 11/02/2019

## 2019-11-02 NOTE — Progress Notes (Signed)
Ortho Trauma Note  Patient was seen in my office last week and we advanced his weightbearing on his leg. He is now WBAT RLE. Will place order for nursing staff. Contact me with questions.  Shona Needles, MD [facility] [phone] (office) [email or web address]

## 2019-11-02 NOTE — Progress Notes (Addendum)
Progress Note  Patient Name: Steven Sellers Date of Encounter: 11/02/2019  [facility] Cardiologist: Rozann Lesches, MD   Subjective   Patient remains in sinus with PVCs. He is feeling good this morning. No chest pain and breathing stable.   Inpatient Medications    Scheduled Meds: . sodium chloride   Intravenous Once  . Chlorhexidine Gluconate Cloth  6 each Topical Daily  . Chlorhexidine Gluconate Cloth  6 each Topical Q0600  . cholecalciferol  2,000 Units Oral Daily  . darbepoetin (ARANESP) injection - DIALYSIS  200 mcg Intravenous Q Sat-HD  . docusate sodium  100 mg Oral BID  . feeding supplement (NEPRO CARB STEADY)  237 mL Oral BID BM  . heparin  5,000 Units Subcutaneous Q8H  . insulin aspart  0-6 Units Subcutaneous TID AC & HS  . lidocaine-prilocaine  1 application Topical Once  . mouth rinse  15 mL Mouth Rinse BID  . multivitamin  1 tablet Oral QHS  . nicotine  21 mg Transdermal Daily  . sevelamer carbonate  3.2 g Oral TID WC  . Vitamin D (Ergocalciferol)  50,000 Units Oral Q7 days   Continuous Infusions:  PRN Meds: acetaminophen, heparin, HYDROcodone-acetaminophen, ipratropium-albuterol, menthol-cetylpyridinium, methocarbamol, ondansetron (ZOFRAN) IV, polyethylene glycol, traZODone   Vital Signs    Vitals:   11/01/19 1225 11/01/19 1406 11/01/19 2006 11/02/19 0518  BP: (!) 146/92 (!) 161/85 (!) 176/85 (!) 160/77  Pulse: 100 93 92 86  Resp: 19 19 17 19   Temp: 98.9 F (37.2 C) 98.7 F (37.1 C) 99 F (37.2 C) 98.2 F (36.8 C)  TempSrc: Oral Oral Oral Oral  SpO2: 100% 100% 100% 100%  Weight:      Height:        Intake/Output Summary (Last 24 hours) at 11/02/2019 0825 Last data filed at 11/01/2019 1407 Gross per 24 hour  Intake 250 ml  Output 3000 ml  Net -2750 ml   Last 3 Weights 11/01/2019 11/01/2019 10/30/2019  Weight (lbs) 130 lb 8.2 oz 143 lb 8.3 oz 143 lb 8.3 oz  Weight (kg) 59.2 kg 65.1 kg 65.1 kg      Telemetry    Sinus rhythm, HR 80-90s,  occasional sinus tach to 105, PVCs, vent bigeminy - Personally Reviewed  ECG     NSR, 88 bpm, nonspecific TW changes aVL- Personally Reviewed  Physical Exam   GEN: No acute distress.   Neck: No JVD Cardiac: RRR, no murmurs, rubs, or gallops.  Respiratory: Clear to auscultation bilaterally. GI: Soft, nontender, non-distended  MS: No edema; No deformity. Neuro:  Nonfocal  Psych: Normal affect   Labs    High Sensitivity Troponin:   Recent Labs  Lab 10/27/19 1224 10/27/19 1415  TROPONINIHS 113* 166*      Chemistry Recent Labs  Lab 10/27/19 1224 10/27/19 1227 10/28/19 0429 10/28/19 0429 10/29/19 0419 10/29/19 2033 10/30/19 0347  NA 138   < > 138   < > 132* 133* 134*  K 7.9*   < > 4.5   < > 5.0 3.4* 3.5  CL 96*   < > 97*   < > 95* 90* 92*  CO2 22   < > 26   < > 24 30 31   GLUCOSE 92   < > 99   < > 150* 129* 113*  BUN 80*   < > 21*   < > 27* 8 11  CREATININE 8.87*   < > 4.01*   < > 4.77* 2.01* 2.47*  CALCIUM 11.2*   < > 8.9   < > 8.9 8.7* 8.7*  PROT 6.6  --  5.1*  --   --   --   --   ALBUMIN 2.7*  --  2.2*  --   --   --   --   AST 16  --  11*  --   --   --   --   ALT 7  --  6  --   --   --   --   ALKPHOS 110  --  73  --   --   --   --   BILITOT 1.5*  --  1.3*  --   --   --   --   GFRNONAA 6*   < > 15*   < > 12* 35* 27*  GFRAA 7*   < > 18*   < > 14* 41* 32*  ANIONGAP 20*   < > 15   < > 13 13 11    < > = values in this interval not displayed.     Hematology Recent Labs  Lab 10/28/19 0429 10/28/19 0429 10/29/19 0419 10/29/19 1645 10/30/19 0347  WBC 14.1*  --  6.5  --  6.5  RBC 2.29*  --  1.95*  --  2.40*  HGB 7.2*   < > 6.1* 8.5* 7.6*  HCT 23.7*   < > 20.0* 26.2* 24.2*  MCV 103.5*  --  102.6*  --  100.8*  MCH 31.4  --  31.3  --  31.7  MCHC 30.4  --  30.5  --  31.4  RDW 14.6  --  14.5  --  14.6  PLT 336  --  188  --  208   < > = values in this interval not displayed.    BNP Recent Labs  Lab 10/27/19 1310  BNP 635.0*     DDimer No results for  input(s): DDIMER in the last 168 hours.   Radiology    No results found.  Cardiac Studies   Echo 10/27/19 . Left ventricular ejection fraction, by estimation, is 60 to 65%. The  left ventricle has normal function. The left ventricle has no regional  wall motion abnormalities. The left ventricular internal cavity size was  mildly dilated. There is severe left  ventricular hypertrophy. Left ventricular diastolic parameters were  normal.  2. Right ventricular systolic function is normal. The right ventricular  size is normal.  3. The mitral valve is normal in structure. No evidence of mitral valve  regurgitation. No evidence of mitral stenosis.  4. Nodular calcification near the non coronary cusp cannot r/o vegetation  on the ventricular side of valve no significant AR/AS . The aortic valve  is abnormal. Aortic valve regurgitation is not visualized. No aortic  stenosis is present.  5. The inferior vena cava is normal in size with greater than 50%  respiratory variability, suggesting right atrial pressure of 3 mmHg.  Patient Profile     59 y.o. male  with a hx of remote PE (2011), HTN, anemia, ESRD on HD TTS(started HD 2017, prior AKI after contrast in 2011), limb girdle muscular dystrophy, alcohol use, tobacco use, normal coronary arteries in 2002, seizures, anemia of chronic disease, vitamin D deficiency who is being seen today for the evaluation of atrial fibrillation, elevated troponin.  Assessment & Plan    Severe hypokalemia in the setting of missed HD - 7.6 on presentation - Improved after HD - 3.5  today  acute hypoxic respiratory failure from volume overload - due to missed dialysis - Intubate 6/17 - extubated 6/19 - currently on RA  Wide complex tachycardia - in the setting of severe metabolic derangements and history of afib - Difficult to know if it is VT or aflutter, although likely alfutter - Echo with preserved EF  Afib RVR - Occurred in the setting of  acute illness. Resolved with IV amiodarone - history of afib but not on coumadin due to ecchymosis - He was prescribed cardizem at home but reportedly not taking it due to side effects - CHADSVASC = 1 for HTN, possible 2 with CHF however do not plan to start a/c due to high bleeding risk - Plan for event monitor to monitor for arrhythmia - Suggest Coreg for rate control and HTN  Elevated troponin - mildly elevated and trend is flat - suspect demand ischemia in the setting of the above issues  QT prolongation - QTc 455ms on EK today  Goals of care - palliative consulted, pt is full code  For questions or updates, please contact [place] Please consult [email or web address] for contact info under        Signed, Cadence Ninfa Meeker, PA-C  11/02/2019, 8:25 AM    I have seen and examined the patient along with Cadence Ninfa Meeker, PA-C .  I have reviewed the chart, notes and new data.  I agree with PA/NP's note.  Key new complaints: feels well Key examination changes: rare PVCs, no other rhythm abnormalities Key new findings / data: electrolytes corrected, QT normalized  PLAN: [facility] will sign off.   Medication Recommendations:  Carvedilol 3.125 mg twice daily Other recommendations (labs, testing, etc):  14 day event monitor - will arrange Follow up as an outpatient:  Will arrange f/u w Dr. Gorden Harms, MD, [place] [phone] 11/02/2019, 10:39 AM

## 2019-11-02 NOTE — Progress Notes (Signed)
[facility] Progress Note   Assessment/ Plan:   1. Hyperkalemia/ acute hypoxic RF: d/t missed dialysis.  Corrected. 2.ESRD TTS [facility], on schedule here  3. Anemia: Hgb 7.6, got aranesp with HD 6/19 4. CKD-MBD: binders 5. Nutrition: renal diet 6. Hypertension: expect to improve with UF 7.  Afib: s/p amio gtt, cardiology following, no strong rec for AC 8.  Limb girdle muscular dystrophy 9.  Dispo: pending, will need SNF.  Appreciate palliative care and renal navigator   Subjective:    Did well with HD yesterday.  Pall care visiting in room with pt and wife.  Discussed increased caregiver burden and stress with decreased mobility in setting of broken leg and limb girdle muscular dystrophy.   Objective:   BP (!) 160/77 (BP Location: Right Arm)   Pulse 86   Temp 98.2 F (36.8 C) (Oral)   Resp 19   Ht 6\' 2"  (1.88 m)   Wt 59.2 kg   SpO2 100%   BMI 16.76 kg/m   Physical Exam: Gen: NAD, sitting in bed CVS: RRR Resp: muffled bases Abd: soft Ext: no LE edema ACCESS: LUE AVF, looks like some dependent upper extremity edema  Labs: BMET Recent Labs  Lab 10/27/19 1224 10/27/19 1224 10/27/19 1227 10/27/19 1227 10/27/19 1515 10/27/19 2139 10/28/19 0421 10/28/19 0429 10/28/19 1301 10/28/19 1802 10/29/19 0419 10/29/19 2033 10/30/19 0347  NA 138   < > 134*  --   --  135 135 138  --   --  132* 133* 134*  K 7.9*   < > 7.6*   < > 4.5 3.7 4.4 4.5  --   --  5.0 3.4* 3.5  CL 96*  --  105  --   --   --   --  97*  --   --  95* 90* 92*  CO2 22  --   --   --   --   --   --  26  --   --  24 30 31   GLUCOSE 92  --  95  --   --   --   --  99  --   --  150* 129* 113*  BUN 80*  --  70*  --   --   --   --  21*  --   --  27* 8 11  CREATININE 8.87*  --  9.30*  --   --   --   --  4.01*  --   --  4.77* 2.01* 2.47*  CALCIUM 11.2*  --   --   --   --   --   --  8.9  --   --  8.9 8.7* 8.7*  PHOS  --   --   --   --   --   --   --   --  5.9* 5.7* 6.5* 2.9  --    < > =  values in this interval not displayed.   CBC Recent Labs  Lab 10/27/19 1224 10/27/19 1227 10/28/19 0429 10/29/19 0419 10/29/19 1645 10/30/19 0347  WBC 18.7*  --  14.1* 6.5  --  6.5  NEUTROABS 17.2*  --  12.1* 5.2  --   --   HGB 9.0*   < > 7.2* 6.1* 8.5* 7.6*  HCT 29.3*   < > 23.7* 20.0* 26.2* 24.2*  MCV 102.8*  --  103.5* 102.6*  --  100.8*  PLT 473*  --  336 188  --  208   < > = values in this interval not displayed.      Medications:    . sodium chloride   Intravenous Once  . carvedilol  3.125 mg Oral BID WC  . Chlorhexidine Gluconate Cloth  6 each Topical Daily  . Chlorhexidine Gluconate Cloth  6 each Topical Q0600  . cholecalciferol  2,000 Units Oral Daily  . darbepoetin (ARANESP) injection - DIALYSIS  200 mcg Intravenous Q Sat-HD  . docusate sodium  100 mg Oral BID  . feeding supplement (NEPRO CARB STEADY)  237 mL Oral BID BM  . heparin  5,000 Units Subcutaneous Q8H  . insulin aspart  0-6 Units Subcutaneous TID AC & HS  . lidocaine-prilocaine  1 application Topical Once  . mouth rinse  15 mL Mouth Rinse BID  . multivitamin  1 tablet Oral QHS  . nicotine  21 mg Transdermal Daily  . sevelamer carbonate  3.2 g Oral TID WC  . Vitamin D (Ergocalciferol)  50,000 Units Oral Q7 days     Madelon Lips MD 11/02/2019, 11:47 AM

## 2019-11-02 NOTE — TOC Progression Note (Signed)
Transition of Care [facility]) - Progression Note    Patient Details  Name: Steven Sellers MRN: [id number] Date of Birth: Mar 02, 1961  Transition of Care [facility]) CM/SW [place], [place] Phone Number: 11/02/2019, 4:10 PM  Clinical Narrative:    CSW met with pt and pt wife at bedside. Introduced self, role, reason for visit. Pt from home with wife. We discussed SNF and options that pt has as pt preferred SNF [facility]) would still need pt to arrange his own transport to dialysis. Pt adamant about returning home and arranging transport through [facility]. He feels that he can do more at home than just "sitting in a chair at a nursing home." CSW provided support for pt and pt wife and discussed what family has at home. Pt has wheelchair, walker, cane, hospital bed, ramp and bedside commode. Pt was active with [facility] before admission and okay with continuing their services. Pt wife provided space to share her thoughts about pt coming home, pt wife declined to share how she feels at this time.   Pt and pt wife interested in any assistance programs in [place] and [place] speaking with renal navigator about transportation options. RNCM Debbie to assist with reordering [facility] services.   Expected Discharge Plan: [place] Barriers to Discharge: Continued Medical Work up  Expected Discharge Plan and Services Expected Discharge Plan: [place] arrangements for the past 2 months: Single Family Home  Readmission Risk Interventions Readmission Risk Prevention Plan 11/02/2019  Transportation Screening Complete  PCP or Specialist Appt within 3-5 Days Complete  HRI or Home Care Consult Complete  Social Work Consult for [place] Planning/Counseling Complete  Palliative Care Screening Complete  Medication Review [employer]) Referral to Pharmacy  Some recent data might be hidden

## 2019-11-02 NOTE — Plan of Care (Signed)

## 2019-11-02 NOTE — Progress Notes (Signed)
Physical Therapy Treatment Patient Details Name: Steven Sellers MRN: [id number] DOB: [birth year] Today's Date: 11/02/2019    History of Present Illness 59 y.o. male past medical history that includes limb-girdle muscular dystrophy, end-stage renal disease on dialysis Tuesday Thursday Saturday, PE in 2012, chronic A. fib not on anticoagulation, hypertension and recent mechanical fall at HD center sustaining R femur fx, s/p ORIF 10/03/19, presented to [facility] with SOB and ventricular arrythmias.  Transferred to [facility] with concern that pt may need continuous HD not available a [facility].    PT Comments    Pt supine in bed on arrival.  Spoke with ortho MD pre tx and he reports the patient is now weight bearing as tolerated on the RLE.  Focused on sit to stand transfers with use of RW.  He lacks ability  To utilize UE due to muscle dysfunction.  Continue to recommend snf placement.  He was unable to progress gt at this time.     Follow Up Recommendations  SNF;Supervision/Assistance - 24 hour     Equipment Recommendations  None recommended by PT;Other (comment) (TBA)    Recommendations for Other Services       Precautions / Restrictions Precautions Precautions: Fall Restrictions Weight Bearing Restrictions: Yes RLE Weight Bearing: Weight bearing as tolerated Other Position/Activity Restrictions: Confirmed with Dr. Doreatha Martin that he is now WBAT.    Mobility  Bed Mobility Overal bed mobility: Needs Assistance Bed Mobility: Supine to Sit;Sit to Supine     Supine to sit: Mod assist;+2 for safety/equipment Sit to supine: Mod assist   General bed mobility comments: MOd assistance for trunk elevation.  Pt required min assistance to move LEs to edge of bed.  assistance to lift B LEs back to bed against gravity.  Transfers Overall transfer level: Needs assistance Equipment used: Rolling walker (2 wheeled) Transfers: Sit to/from Stand Sit to Stand: Mod assist         General transfer comment: Face  to Face with RW.  Bed elevated and assistance for forward weight shifting and to boost into standing.  Performed x3 trials with assistance for maintaining and finding midline.  Ambulation/Gait Ambulation/Gait assistance:  (Unable.)               Stairs             Wheelchair Mobility    Modified Rankin (Stroke Patients Only)       Balance Overall balance assessment: Needs assistance Sitting-balance support: No upper extremity supported;Single extremity supported Sitting balance-Leahy Scale: Fair       Standing balance-Leahy Scale: Poor                              Cognition Arousal/Alertness: Awake/alert Behavior During Therapy: WFL for tasks assessed/performed Overall Cognitive Status: Impaired/Different from baseline Area of Impairment: Safety/judgement                         Safety/Judgement: Decreased awareness of safety;Decreased awareness of deficits     General Comments: pt aware of needing not to put weight through his RLE but continues to do so, requiring frequent cues to maintain      Exercises      General Comments        Pertinent Vitals/Pain Pain Assessment: Faces Faces Pain Scale: Hurts little more Pain Location: right leg surgical site. Pain Descriptors / Indicators: Discomfort;Grimacing Pain Intervention(s): Monitored during session  Home Living                      Prior Function            PT Goals (current goals can now be found in the care plan section) Acute Rehab PT Goals Patient Stated Goal: start mobilizing better as my leg gets better. PT Goal Formulation: With patient Potential to Achieve Goals: Fair Progress towards PT goals: Progressing toward goals    Frequency    Min 3X/week      PT Plan Current plan remains appropriate    Co-evaluation              AM-PAC PT "6 Clicks" Mobility   Outcome Measure  Help needed turning from your back to your side while in a  flat bed without using bedrails?: A Lot Help needed moving from lying on your back to sitting on the side of a flat bed without using bedrails?: A Lot Help needed moving to and from a bed to a chair (including a wheelchair)?: A Lot Help needed standing up from a chair using your arms (e.g., wheelchair or bedside chair)?: Total Help needed to walk in hospital room?: Total Help needed climbing 3-5 steps with a railing? : Total 6 Click Score: 9    End of Session Equipment Utilized During Treatment: Gait belt Activity Tolerance: Patient tolerated treatment well;Other (comment) Patient left: in bed;with call bell/phone within reach;with chair alarm set Nurse Communication: Mobility status PT Visit Diagnosis: Other abnormalities of gait and mobility (R26.89);Muscle weakness (generalized) (M62.81) Pain - Right/Left: Right Pain - part of body: Leg     Time: 0258-5277 PT Time Calculation (min) (ACUTE ONLY): 22 min  Charges:  $Therapeutic Activity: 8-22 mins                     Erasmo Leventhal , PTA Acute Rehabilitation Services Pager [phone] Office [phone]     Steven Sellers Eli Hose 11/02/2019, 6:12 PM

## 2019-11-02 NOTE — Progress Notes (Signed)
Patient ID: Steven Sellers, male   DOB: Sep 04, 1960, 59 y.o.   MRN: [id number]  This NP visited patient at the bedside as a follow up to  yesterday's [name] for palliative medicine needs, emotional support and to meet as scheduled with wife at bedside for continued conversation regarding current medical situation.  Patient has multiple comorbidities and has continued physical and functional decline over the past many months, decline was accelerated from recent right distal femur fracture.  Patient and family recognize the increasing care needs and their concerns regarding needs discharge from the hospital.  They also verbalize financial concerns. Currently transition of care team is investigating rehab options, family is requesting [facility].  I shared with patient and wife my concerns regarding his high risk for decompensation and ongoing increasing care needs at home. Education offered regarding the concept of failure to thrive and the limitations of medical interventions to prolong life when the body does begin to fail to thrive.  Encourage patient and family to secure healthcare power of attorney and advanced directive while here in the hospital with the assistance of spiritual care department.  I left a copy of the advanced care packet and a MOST form at the bedside.  Education offered and recommendations for DNR/DNI documentation.     Encouraged patient/family to consider DNR/DNI status understanding evidenced based poor outcomes in similar hospitalized patient, as the cause of arrest is likely associated with advanced chronic illness rather than an easily reversible acute cardio-pulmonary event.  Discussed with patient the importance of continued conversation with his family and their  medical providers regarding overall plan of care and treatment options,  ensuring decisions are within the context of the patients values and GOCs.  Questions and concerns addressed   Discussed with Dr  Hollie Salk and Isabel/LCSW  Total time spent on the unit was 35 minutes    Recommend outpatient community-based palliative services on discharge.   Patient and family are encouraged to call with questions or concerns.  Greater than 50% of the time was spent in counseling and coordination of care  Wadie Lessen NP  Palliative Medicine Team Team Phone # [phone] Pager [phone]

## 2019-11-03 DIAGNOSIS — R531 Weakness: Secondary | ICD-10-CM

## 2019-11-03 DIAGNOSIS — Z7189 Other specified counseling: Secondary | ICD-10-CM

## 2019-11-03 DIAGNOSIS — Z515 Encounter for palliative care: Secondary | ICD-10-CM

## 2019-11-03 DIAGNOSIS — Z789 Other specified health status: Secondary | ICD-10-CM

## 2019-11-03 DIAGNOSIS — I2699 Other pulmonary embolism without acute cor pulmonale: Secondary | ICD-10-CM

## 2019-11-03 DIAGNOSIS — R627 Adult failure to thrive: Secondary | ICD-10-CM

## 2019-11-03 LAB — BASIC METABOLIC PANEL
Anion gap: 13 (ref 5–15)
BUN: 28 mg/dL — ABNORMAL HIGH (ref 6–20)
CO2: 26 mmol/L (ref 22–32)
Calcium: 9.5 mg/dL (ref 8.9–10.3)
Chloride: 100 mmol/L (ref 98–111)
Creatinine, Ser: 4.18 mg/dL — ABNORMAL HIGH (ref 0.61–1.24)
GFR calc Af Amer: 17 mL/min — ABNORMAL LOW (ref >60)
GFR calc non Af Amer: 15 mL/min — ABNORMAL LOW (ref >60)
Glucose, Bld: 101 mg/dL — ABNORMAL HIGH (ref 70–99)
Potassium: 4.6 mmol/L (ref 3.5–5.1)
Sodium: 139 mmol/L (ref 135–145)

## 2019-11-03 LAB — CBC
HCT: 28.5 % — ABNORMAL LOW (ref 39.0–52.0)
Hemoglobin: 8.6 g/dL — ABNORMAL LOW (ref 13.0–17.0)
MCH: 31.7 pg (ref 26.0–34.0)
MCHC: 30.2 g/dL (ref 30.0–36.0)
MCV: 105.2 fL — ABNORMAL HIGH (ref 80.0–100.0)
Platelets: 259 10*3/uL (ref 150–400)
RBC: 2.71 MIL/uL — ABNORMAL LOW (ref 4.22–5.81)
RDW: 15 % (ref 11.5–15.5)
WBC: 8.3 10*3/uL (ref 4.0–10.5)
nRBC: 0.2 % (ref 0.0–0.2)

## 2019-11-03 LAB — HEPATITIS B SURFACE ANTIGEN: Hepatitis B Surface Ag: NONREACTIVE

## 2019-11-03 LAB — MAGNESIUM: Magnesium: 2.2 mg/dL (ref 1.7–2.4)

## 2019-11-03 MED ORDER — PENTAFLUOROPROP-TETRAFLUOROETH EX AERO: 1.0000 "application " | INHALATION_SPRAY | CUTANEOUS | Status: DC | PRN

## 2019-11-03 MED ORDER — ALTEPLASE 2 MG IJ SOLR: 2.0000 mg | Freq: Once | INTRAMUSCULAR | Status: DC | PRN

## 2019-11-03 MED ORDER — LIDOCAINE HCL (PF) 1 % IJ SOLN: 5.0000 mL | INTRAMUSCULAR | Status: DC | PRN

## 2019-11-03 MED ORDER — SODIUM CHLORIDE 0.9 % IV SOLN: 100.0000 mL | INTRAVENOUS | Status: DC | PRN

## 2019-11-03 MED ORDER — HEPARIN SODIUM (PORCINE) 1000 UNIT/ML DIALYSIS: 1000.0000 [IU] | INTRAMUSCULAR | Status: DC | PRN

## 2019-11-03 MED ORDER — NICOTINE 21 MG/24HR TD PT24: 21.0000 mg | MEDICATED_PATCH | Freq: Every day | TRANSDERMAL | 0 refills | Status: AC

## 2019-11-03 MED ORDER — LIDOCAINE-PRILOCAINE 2.5-2.5 % EX CREA: 1.0000 "application " | TOPICAL_CREAM | CUTANEOUS | Status: DC | PRN

## 2019-11-03 MED ORDER — CARVEDILOL 3.125 MG PO TABS: 3.1250 mg | ORAL_TABLET | Freq: Two times a day (BID) | ORAL | 2 refills | Status: AC

## 2019-11-03 NOTE — Discharge Summary (Signed)
Physician Discharge Summary  Steven Sellers:[id number] DOB: May 31, 1960 DOA: 10/27/2019  PCP: Sharilyn Sites, MD  Admit date: 10/27/2019 Discharge date: 11/03/2019  Admitted From: Home  Discharge disposition: Home with home health   Recommendations for Outpatient Follow-Up:    Follow up with your primary care provider in one week.   Check CBC, BMP , magnesium in the next visit    Continue hemodialysis as scheduled as outpatient.    Weightbearing as tolerated on the right lower extremity.    Cardiology to arrange 14-day monitor after discharge.    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  Diet recommendation: Low sodium, heart healthy.    Wound care: None.   Code status: Full.   History of Present Illness:   59 yo white male smoker with past medical history of end-stage renal disease on hemodialysis who missed hemodialysis on 10/22/19 and 10/25/19, limb-girdle muscular dystrophy, anemia, chronic atrial fibrillation, history of recent fall and right femur fracture status post surgery on 10/03/2019 presented to [facility]  with shortness of breath.  He was noted to have severe hyperkalemia and ventricular arrhythmia and was treated with temporizing measures including calcium gluconate, bicarbonate, insulin and dextrose.  Patient was then transferred to [facility] for hemodialysis which was not  available at [facility] Course:   Following conditions were addressed during hospitalization as listed below,  Hyperkalemia with V. tach/A. fib with aberrant conduction.  Treated with amiodarone drip and hemodialysis initally.  Patient had potassium levels of up to 7.6 on presentation.  Potassium improved subsequently with hemodialysis and temporizing measures. Cardiology was consulted and patient was recommended Coreg on discharge.   acute hypoxemic respiratory failure. Resolved.  On presentation due to missed hemodialysis.  Patient was initially on 4 L of oxygen by EMS.  Subsequently, was intubated on 10/27/2019 in the ED.  Extubated 10/29/2019.   Currently on room air and has remained stable.   Atrial fibrillation with RVR on presentation,  initially received amiodarone drip.    Cardiology was consulted and recommended Coreg on discharge. Holding off with anticoagulation at this time as per cardiology recommendation..  Patient will likely need outpatient monitor on discharge and follow-up with cardiology clinic at the [facility] clinic.    Chronic diastolic congestive heart failure.  Compensated at this time.  Last echo in 2017 with ejection fraction of 55 to 60%.  2D echocardiogram on 10/27/2019 with LV ejection fraction of 60 to 65% with no wall motion abnormality.  Volume managed with hemodialysis.  ESRD on HD-TTS schedule at [facility].  Patient was seen by nephrology during hospitalization.  COPD-compensated.  History of pulmonary embolism not on anticoagulation.  Muscular dystrophy.  Continue physical therapy on discharge.  Home physical therapy will be arranged at this time.  Nicotine use disorder.  On nicotine patch.  Continue on discharge..  Anemia of chronic disease.  Likely secondary to end-stage renal disease.  He is on Aranesp weekly.    Leukocytosis.  Has normalized at this time.  Off antibiotic.    Disposition.  At this time, patient  is stable for disposition to home with home health services.  Patient strongly wished to for discharge home though physical therapy recommended skilled nursing facility level of care. Patient will also be followed up by palliative care as outpatient.  Medical Consultants:    Nephrology  PCCM  Cardiology  Palliative care  Procedures:    Intubation and mechanical ventilation on 10/27/2019 Left femoral catheter on 10/27/2019 extubated6/19 Hemodialysis  Subjective:   Today, patient feels okay.  Wants to go home.  Does not wish to go to skilled nursing facility.  Denies any shortness of breath, cough, fever, chills or rigor.  Discharge Exam:   Vitals:   11/03/19 1530 11/03/19 1600  BP: 122/61 133/67  Pulse: 79 90  Resp: (!) 27   Temp:    SpO2:     Vitals:   11/03/19 1430 11/03/19 1500 11/03/19 1530 11/03/19 1600  BP: 123/75 (!) 148/69 122/61 133/67  Pulse: 88 80 79 90  Resp: 20 (!) (P) 25 (!) 27   Temp:      TempSrc:      SpO2:      Weight:      Height:        General: Alert awake, not in obvious distress HENT: pupils equally reacting to light,  No scleral pallor or icterus noted. Oral mucosa is moist.  Chest:  Clear breath sounds.  Diminished breath sounds bilaterally. No crackles or wheezes.  CVS: S1 &S2 heard. No murmur.  Regular rate and rhythm. Abdomen: Soft, nontender, nondistended.  Bowel sounds are heard.   Extremities: No cyanosis, clubbing or edema.  Peripheral pulses are palpable.LUE AV fistula Psych: Alert, awake and oriented, normal mood CNS:  No cranial nerve deficits.  Power equal in all extremities.   Skin: Warm and dry.  No rashes noted.  The results of significant diagnostics from this hospitalization (including imaging, microbiology, ancillary and laboratory) are listed below for reference.     Diagnostic Studies:   DG Chest 1V REPEAT Same Day  Result Date: 10/27/2019 CLINICAL DATA:  59 year old male with loss of breath sounds. EXAM: CHEST  - 1 VIEW SAME DAY COMPARISON:  1323 hours today and earlier. FINDINGS: Portable AP upright view at 1435 hours. Enteric tube removed. Endotracheal tube tip is stable just above the clavicles. Stable pacer/resuscitation pads. Evidence of left lower lobe atelectasis with confluent stable retrocardiac opacity. No pneumothorax, pulmonary edema or definite pleural effusion. Ventilation appears unchanged from earlier. No confluent opacity on the right. No acute osseous abnormality identified. IMPRESSION: 1. Enteric tube removed. Stable ET tube which could be advanced 1 cm for more optimal placement. 2. Stable ventilation from earlier with left lower lobe collapse suspected. No new cardiopulmonary abnormality. Electronically Signed   By: Genevie Ann M.D.   On: 10/27/2019 14:59   DG CHEST PORT 1 VIEW  Result Date: 10/28/2019 CLINICAL DATA:  On mechanically assisted ventilation. Acute respiratory failure with hypoxemia. EXAM: PORTABLE CHEST 1 VIEW COMPARISON:  Chest x-ray dated 10/27/2019 FINDINGS: Endotracheal tube is 4 cm above the carina in position. NG tube has been exchanged for a feeding tube in the tip is below the diaphragm. New central venous catheter has been inserted and the tip is at the junction of innominate vein and superior vena cava just above the carina. No pneumothorax. There is persistent consolidation and/or atelectasis  in the left lower lobe. Minimal atelectasis at the right lung base. IMPRESSION: Persistent consolidation and/or atelectasis in the left lower lobe. Electronically Signed   By: Lorriane Shire M.D.   On: 10/28/2019 16:29   DG CHEST PORT 1 VIEW  Result Date: 10/27/2019 CLINICAL DATA:  Hypoxia EXAM: PORTABLE CHEST 1 VIEW COMPARISON:  10/27/2019, 10/01/2019 FINDINGS: Endotracheal tube tip about 3.6 cm superior to the carina. Esophageal tube tip below the diaphragm but incompletely visualized. Scarring at the right base. Persistent consolidation at the left lung base with possible effusion.  Stable cardiomediastinal silhouette. IMPRESSION: 1. Endotracheal tube tip about 3.6 cm superior to carina 2. Persistent consolidation at the left lung base with possible small effusion Electronically Signed   By: Donavan Foil M.D.   On: 10/27/2019 23:49   DG Chest Port 1 View  Result Date: 10/27/2019 CLINICAL DATA:  Intubated EXAM: PORTABLE CHEST 1 VIEW COMPARISON:  10/27/2019 at 2:35 p.m. FINDINGS: Single frontal view of the chest demonstrates endotracheal tube overlying tracheal air column tip just below thoracic inlet. External defibrillator pads again identified. Cardiac silhouette is stable. Persistent left lower lobe consolidation. Small left effusion not excluded. No pneumothorax. IMPRESSION: 1. Appropriate position of the endotracheal tube. 2. Persistent left lower lobe consolidation. Electronically Signed   By: Randa Ngo M.D.   On: 10/27/2019 18:27   DG Chest Portable 1 View  Result Date: 10/27/2019 CLINICAL DATA:  Post intubation. EXAM: PORTABLE CHEST 1 VIEW COMPARISON:  10/01/2019 FINDINGS: Interval placement of endotracheal tube with tip 10 cm above the carina. Lungs are adequately inflated with hazy opacification over the left base likely small effusion with atelectasis. Cardiomediastinal silhouette and remainder of the exam is unchanged. IMPRESSION: Left base opacification likely small effusion with atelectasis. Endotracheal tube with tip 10 cm above the carina. Electronically Signed   By: Marin Olp M.D.   On: 10/27/2019 12:38   DG Chest Port 1V same Day  Result Date: 10/27/2019 CLINICAL DATA:  Orogastric tube placement EXAM: PORTABLE CHEST 1 VIEW COMPARISON:  Same-day radiograph FINDINGS: Interval placement of enteric tube which is abnormally positioned following the course of the left mainstem bronchus and terminating in the region of the left lower lobe. Endotracheal tube terminates approximately 7.7 cm superior to the carina. Overlying pacer pads. Stable cardiomediastinal  contours. Persistent left basilar opacification. No pneumothorax. IMPRESSION: 1. Interval placement of enteric tube which is abnormally positioned following the course of the left mainstem bronchus and terminating in the region of the left lower lobe. Recommend repositioning. 2. Endotracheal tube terminates approximately 7.7 cm superior to the carina. These results were called by telephone at the time of interpretation on 10/27/2019 at 1:53 pm to provider Fredia Sorrow , who verbally acknowledged these results. Electronically Signed   By: Davina Poke D.O.   On: 10/27/2019 13:55   DG Abd Portable 1V  Result Date: 10/27/2019 CLINICAL DATA:  OG tube EXAM: PORTABLE ABDOMEN - 1 VIEW COMPARISON:  Chest x-ray 10/27/2019 FINDINGS: Esophageal tube tip overlies the proximal stomach, side-port in the region of GE junction. Probable small left effusion with left basilar consolidation. IMPRESSION: Esophageal tube side-port overlies GE junction, further advancement could be considered for more optimal positioning Electronically Signed   By: Donavan Foil M.D.   On: 10/27/2019 23:49   ECHOCARDIOGRAM COMPLETE  Result Date: 10/27/2019    ECHOCARDIOGRAM REPORT   Patient Name:   LLEYTON BYERS Date of Exam: 10/27/2019 Medical Rec #:  [id number]  Height:       74.0 in Accession #:    [id number]      Weight:       143.3 lb Date of Birth:  11-06-1960       BSA:          1.886 m Patient Age:    60 years        BP:           111/74 mmHg Patient Gender: M               HR:           108 bpm. Exam Location:  [facility] Procedure: 2D Echo, Cardiac Doppler and Color Doppler Indications:    CHF-Acute Diastolic 998.33 / A25.05  History:        Patient has prior history of Echocardiogram examinations, most                 recent 09/26/2016. CHF, Arrythmias:Atrial Fibrillation; Risk                 Factors:Hypertension. Chronic a-fib ----Declined                 Anti-coagulation due to Frequent Bruising,ESRD on                  dialysis,Muscular dystrophy ([place]) (From Hx),Tobacco abuse (From                 Hx).  Sonographer:    Alvino Chapel RCS Referring Phys: [phone] [facility]  Sonographer Comments: Patient on mechanical ventilator and dialysis at time of echo. IMPRESSIONS  1. Left ventricular ejection fraction, by estimation, is 60 to 65%. The left ventricle has normal function. The left ventricle has no regional wall motion abnormalities. The left ventricular internal cavity size was mildly dilated. There is severe left ventricular hypertrophy. Left ventricular diastolic parameters were normal.  2. Right ventricular systolic function is normal. The right ventricular size is normal.  3. The mitral valve is normal in structure. No evidence of mitral valve regurgitation. No evidence of mitral stenosis.  4. Nodular calcification near the non coronary cusp cannot r/o vegetation on the ventricular side of valve no significant AR/AS . The aortic valve is abnormal. Aortic valve regurgitation is not visualized. No aortic stenosis is present.  5. The inferior vena cava is normal in size with greater than 50% respiratory variability, suggesting right atrial pressure of 3 mmHg. FINDINGS  Left Ventricle: Left ventricular ejection fraction, by estimation, is 60 to 65%. The left ventricle has normal function. The left ventricle has no regional wall motion abnormalities. The left ventricular internal cavity size was mildly dilated. There is  severe left ventricular hypertrophy. Left ventricular diastolic parameters were normal. Right Ventricle: The right ventricular size is normal. No increase in right ventricular wall thickness. Right ventricular systolic function is normal. Left Atrium: Left atrial size was normal in size. Right Atrium: Right atrial size was normal in size. Pericardium: There is no evidence of pericardial effusion. Mitral Valve: The mitral valve is normal in structure. There is mild thickening of the mitral valve leaflet(s).  There is mild calcification of the mitral valve leaflet(s). Normal mobility of the mitral valve leaflets. Mild mitral annular calcification. No evidence of mitral valve regurgitation. No evidence of mitral valve stenosis. Tricuspid Valve: The tricuspid valve is normal in structure. Tricuspid valve regurgitation is mild . No evidence of tricuspid stenosis. Aortic Valve: Nodular calcification near the non coronary  cusp cannot r/o vegetation on the ventricular side of valve no significant AR/AS. The aortic valve is abnormal. Aortic valve regurgitation is not visualized. No aortic stenosis is present. Pulmonic Valve: The pulmonic valve was normal in structure. Pulmonic valve regurgitation is not visualized. No evidence of pulmonic stenosis. Aorta: The aortic root is normal in size and structure. Venous: The inferior vena cava is normal in size with greater than 50% respiratory variability, suggesting right atrial pressure of 3 mmHg. IAS/Shunts: The interatrial septum was not well visualized.  LEFT VENTRICLE PLAX 2D LVIDd:         4.11 cm LVIDs:         2.83 cm LV PW:         1.39 cm LV IVS:        1.72 cm LVOT diam:     2.20 cm LV SV:         57 LV SV Index:   30 LVOT Area:     3.80 cm  RIGHT VENTRICLE RV S prime:     11.80 cm/s TAPSE (M-mode): 2.0 cm LEFT ATRIUM             Index       RIGHT ATRIUM           Index LA diam:        2.80 cm 1.48 cm/m  RA Area:     12.20 cm LA Vol (A2C):   52.6 ml 27.88 ml/m RA Volume:   30.80 ml  16.33 ml/m LA Vol (A4C):   30.0 ml 15.90 ml/m LA Biplane Vol: 41.7 ml 22.11 ml/m  AORTIC VALVE LVOT Vmax:   110.00 cm/s LVOT Vmean:  68.500 cm/s LVOT VTI:    0.149 m  AORTA Ao Root diam: 3.30 cm MITRAL VALVE MV Area (PHT): 5.54 cm     SHUNTS MV Decel Time: 137 msec     Systemic VTI:  0.15 m MV E velocity: 101.00 cm/s  Systemic Diam: 2.20 cm Jenkins Rouge MD Electronically signed by Jenkins Rouge MD Signature Date/Time: 10/27/2019/5:44:49 PM    Final      Labs:   Basic Metabolic  Panel: Recent Labs  Lab 10/28/19 0429 10/28/19 1301 10/28/19 1301 10/28/19 1802 10/29/19 0419 10/29/19 0419 10/29/19 2033 10/29/19 2033 10/30/19 0347 10/30/19 0347 11/02/19 1437 11/03/19 0747  NA   < >  --   --   --  132*  --  133*  --  134*  --  138 139  K   < >  --   --   --  5.0   < > 3.4*   < > 3.5   < > 3.3* 4.6  CL   < >  --   --   --  95*  --  90*  --  92*  --  98 100  CO2   < >  --   --   --  24  --  30  --  31  --  28 26  GLUCOSE   < >  --   --   --  150*  --  129*  --  113*  --  101* 101*  BUN   < >  --   --   --  27*  --  8  --  11  --  20 28*  CREATININE   < >  --   --   --  4.77*  --  2.01*  --  2.47*  --  3.49* 4.18*  CALCIUM   < >  --   --   --  8.9  --  8.7*  --  8.7*  --  9.2 9.5  MG  --  1.7   < > 1.7 2.0  --  1.8  --   --   --  2.3 2.2  PHOS  --  5.9*  --  5.7* 6.5*  --  2.9  --   --   --  3.0  --    < > = values in this interval not displayed.   GFR Estimated Creatinine Clearance: 18 mL/min (A) (by C-G formula based on SCr of 4.18 mg/dL (H)). Liver Function Tests: Recent Labs  Lab 10/28/19 0429  AST 11*  ALT 6  ALKPHOS 73  BILITOT 1.3*  PROT 5.1*  ALBUMIN 2.2*   No results for input(s): LIPASE, AMYLASE in the last 168 hours. No results for input(s): AMMONIA in the last 168 hours. Coagulation profile No results for input(s): INR, PROTIME in the last 168 hours.  CBC: Recent Labs  Lab 10/28/19 0429 10/28/19 0429 10/29/19 0419 10/29/19 1645 10/30/19 0347 11/02/19 1437 11/03/19 0747  WBC 14.1*  --  6.5  --  6.5 8.5 8.3  NEUTROABS 12.1*  --  5.2  --   --   --   --   HGB 7.2*   < > 6.1* 8.5* 7.6* 8.4* 8.6*  HCT 23.7*   < > 20.0* 26.2* 24.2* 27.6* 28.5*  MCV 103.5*  --  102.6*  --  100.8* 103.4* 105.2*  PLT 336  --  188  --  208 271 259   < > = values in this interval not displayed.   Cardiac Enzymes: No results for input(s): CKTOTAL, CKMB, CKMBINDEX, TROPONINI in the last 168 hours. BNP: Invalid input(s): POCBNP CBG: Recent Labs  Lab  11/01/19 2002 11/02/19 0735 11/02/19 1215 11/02/19 1737 11/02/19 2055  GLUCAP 92 88 99 88 95   D-Dimer No results for input(s): DDIMER in the last 72 hours. Hgb A1c No results for input(s): HGBA1C in the last 72 hours. Lipid Profile No results for input(s): CHOL, HDL, LDLCALC, TRIG, CHOLHDL, LDLDIRECT in the last 72 hours. Thyroid function studies No results for input(s): TSH, T4TOTAL, T3FREE, THYROIDAB in the last 72 hours.  Invalid input(s): FREET3 Anemia work up No results for input(s): VITAMINB12, FOLATE, FERRITIN, TIBC, IRON, RETICCTPCT in the last 72 hours. Microbiology Recent Results (from the past 240 hour(s))  SARS Coronavirus 2 by RT PCR (hospital order, performed in [facility] hospital lab) Nasopharyngeal Nasopharyngeal Swab     Status: None   Collection Time: 10/27/19 12:25 PM   Specimen: Nasopharyngeal Swab  Result Value Ref Range Status   SARS Coronavirus 2 NEGATIVE NEGATIVE Final    Comment: (NOTE) SARS-CoV-2 target nucleic acids are NOT DETECTED.  The SARS-CoV-2 RNA is generally detectable in upper and lower respiratory specimens during the acute phase of infection. The lowest concentration of SARS-CoV-2 viral copies this assay can detect is 250 copies / mL. A negative result does not preclude SARS-CoV-2 infection and should not be used as the sole basis for treatment or other patient management decisions.  A negative result may occur with improper specimen collection / handling, submission of specimen other than nasopharyngeal swab, presence of viral mutation(s) within the areas targeted by this assay, and inadequate number of viral copies (<250 copies / mL). A negative result must be combined with clinical observations, patient history, and epidemiological information.  Fact Sheet for Patients:   StrictlyIdeas.no  Fact Sheet for Healthcare Providers: BankingDealers.co.za  This test is not yet approved or   cleared by the [place] FDA and has been authorized for detection and/or diagnosis of SARS-CoV-2 by FDA under an Emergency Use Authorization (EUA).  This EUA will remain in effect (meaning this test can be used) for the duration of the COVID-19 declaration under Section 564(b)(1) of the Act, 21 U.S.C. section 360bbb-3(b)(1), unless the authorization is terminated or revoked sooner.  Performed at [facility], [address] [place], [place] [address]   Blood culture (routine x 2)     Status: None   Collection Time: 10/27/19  1:10 PM   Specimen: BLOOD  Result Value Ref Range Status   Specimen Description BLOOD LEFT FEMORAL CATH  Final   Special Requests   Final    BOTTLES DRAWN AEROBIC AND ANAEROBIC Blood Culture adequate volume   Culture   Final    NO GROWTH 5 DAYS Performed at [facility], [address] [place], [place] [address]    Report Status 11/01/2019 FINAL  Final  MRSA PCR Screening     Status: Abnormal   Collection Time: 10/27/19 11:17 PM   Specimen: Nasal Mucosa; Nasopharyngeal  Result Value Ref Range Status   MRSA by PCR POSITIVE (A) NEGATIVE Final    Comment:        The GeneXpert MRSA Assay (FDA approved for NASAL specimens only), is one component of a comprehensive MRSA colonization surveillance program. It is not intended to diagnose MRSA infection nor to guide or monitor treatment for MRSA infections. RESULT CALLED TO, READ BACK BY AND VERIFIED WITH: J CRUISE RN 10/28/19 0101 JDW Performed at [facility] [address], [place], [place] [address]      Discharge Instructions:   Discharge Instructions    Call MD for:  persistant nausea and vomiting   Complete by: As directed    Call MD for:  severe uncontrolled pain   Complete by: As directed    Call MD for:  temperature >100.4   Complete by: As directed    Diet - low sodium heart healthy   Complete by: As directed    Discharge instructions   Complete by: As directed    Take all the  medications as prescribed.  Follow-up with your primary care provider in 1 week.  Check blood work at that time.  Continue hemodialysis as scheduled as outpatient.  Weightbearing as tolerated on the right lower extremity.  You will be arranged for 14-day monitor after discharge.  Please follow-up with cardiology  clinic on 11/29/2019   Discharge wound care:   Complete by: As directed    Local wound care   Increase activity slowly   Complete by: As directed      Allergies as of 11/03/2019      Reactions   Diltiazem    Patient recalls feeling horribly sick, presyncopal after taking   Other Other (See Comments)   IV contrast- Renal issues      Medication List    STOP taking these medications   oxyCODONE-acetaminophen 5-325 MG tablet Commonly known as: Percocet     TAKE these medications   acetaminophen 500 MG tablet Commonly known as: TYLENOL Take 1,000 mg by mouth every 6 (six) hours as needed for mild pain or moderate pain.   carvedilol 3.125 MG tablet Commonly known as: COREG Take 1 tablet (3.125 mg total) by mouth 2 (two) times  daily with a meal.   Darbepoetin Alfa 100 MCG/0.5ML Sosy injection Commonly known as: ARANESP Inject 0.5 mLs (100 mcg total) into the vein every Friday with hemodialysis.   docusate sodium 100 MG capsule Commonly known as: COLACE Take 1 capsule (100 mg total) by mouth 2 (two) times daily.   lidocaine-prilocaine cream Commonly known as: EMLA Apply 1 application topically once.   methocarbamol 500 MG tablet Commonly known as: ROBAXIN Take 1 tablet (500 mg total) by mouth every 6 (six) hours as needed for muscle spasms.   nicotine 21 mg/24hr patch Commonly known as: NICODERM CQ - dosed in mg/24 hours Place 1 patch (21 mg total) onto the skin daily. Start taking on: November 04, 2019   oxyCODONE 5 MG immediate release tablet Commonly known as: Oxy IR/ROXICODONE Take 1 tablet (5 mg total) by mouth every 6 (six) hours as needed for moderate pain or  severe pain.   polyethylene glycol 17 g packet Commonly known as: MIRALAX / GLYCOLAX Take 17 g by mouth daily as needed for mild constipation.   sevelamer carbonate 0.8 g Pack packet Commonly known as: RENVELA Take 3.2 g by mouth 3 (three) times daily with meals.   traZODone 50 MG tablet Commonly known as: DESYREL Take 1 tablet (50 mg total) by mouth at bedtime as needed for sleep.   Vitamin D (Ergocalciferol) 1.25 MG (50000 UNIT) Caps capsule Commonly known as: DRISDOL Take 1 capsule (50,000 Units total) by mouth every 7 (seven) days.   Vitamin D3 50 MCG (2000 UT) capsule Take 1 capsule (2,000 Units total) by mouth daily.            Discharge Care Instructions  (From admission, onward)         Start     Ordered   11/03/19 0000  Discharge wound care:       Comments: Local wound care   11/03/19 [place], [place], PA-C Follow up on 11/29/2019.   Specialties: Physician Assistant, Cardiology Why: @ 1PM Contact information: [place] [address] [phone]        Sharilyn Sites, MD. Schedule an appointment as soon as possible for a visit.   Specialty: Family Medicine Why: for followup Contact information: [address] [place] [address] [phone]        Satira Sark, MD .   Specialty: Cardiology Contact information: [place] [address] [phone]                Time coordinating discharge: 39 minutes  Signed:  Demari Sellers  [facility] 11/03/2019, 4:11 PM

## 2019-11-03 NOTE — TOC Transition Note (Addendum)
Transition of Care [facility]) - CM/SW Discharge Note   Patient Details  Name: BALIN VANDEGRIFT MRN: [id number] Date of Birth: [birth year]  Transition of Care [facility]) CM/SW Contact:  Verdell Carmine, RN Phone Number: 11/03/2019, 10:24 AM   Clinical Narrative:    [facility] per Havery Moros, as well palliative services were consulted through [place]. He will be transported home with hospital transport (PTAR) once discharge instructions are given, and discharge is complete. [facility] with call to restart services. Patient has equipment at home. No further needs identified.     Final next level of care: Home w Home Health Services Barriers to Discharge: No Barriers Identified   Patient Goals and CMS Choice Patient states their goals for this hospitalization and ongoing recovery are:: Wife is requesting continued rehab at a skilled nursing facility CMS Medicare.gov Compare Post Acute Care list provided to::  (pt active with [facility]) Choice offered to / list presented to : Patient, Spouse  Discharge Placement             Home with Home health          Discharge Plan and Services                          HH Arranged: PT, OT, Nurse's Aide, Social Work [employer] Agency: [place] ([place])   Time [place]: [place] Representative spoke with at [place]: [place] (SDOH) Interventions     Readmission Risk Interventions Readmission Risk Prevention Plan 11/02/2019  Transportation Screening Complete  PCP or Specialist Appt within 3-5 Days Complete  HRI or [place] Complete  Social Work Consult for [place] Planning/Counseling Complete  Palliative Care Screening Complete  Medication Review Press photographer) Referral to Pharmacy  Some recent data might be hidden

## 2019-11-03 NOTE — Care Management Important Message (Signed)
Important Message  Patient Details  Name: Steven Sellers MRN: [id number] Date of Birth: [birth year]   Medicare Important Message Given:  Yes     [name] [place] [address] 11/03/2019, 2:43 PM

## 2019-11-03 NOTE — Social Work (Signed)
TOC team also has referred pt per PMT consult to outpatient palliative with [facility].   They will f/u with PCP and pt.  Westley Hummer, MSW, [place] Work

## 2019-11-03 NOTE — Plan of Care (Signed)

## 2019-11-03 NOTE — Progress Notes (Signed)
Renal Navigator asked to meet with patient regarding transportation to OP HD. Navigator met with patient to discuss his current situation and plan for discharge. He states he was driving himself to HD prior to breaking his leg approximately 5 weeks ago. He was in a SNF for rehab since then, but feels he would be better off going home with [facility] at discharge, however, does not know how he will get back and forth to OP HD, as he needs wheelchair transportation.  Navigator informed him that the only free wheelchair transport in [facility] is for patients with Medicaid, but encouraged him to call his Medicare Advantage plan to see if there is any transportation coverage. Navigator is certain that if there is coverage provided, it will not cover all needed transportation 3 times per week and said this to patient. He is understanding. Navigator discussed paying a wheelchair transportation company to take him to/from OP HD while he is recovering from his leg injury as his only option if he is returning home and cannot transport in his wife's vehicle at this time. Patient is very understanding and feels this is what he will need to do. Navigator confirmed this with OP HD clinic Social Worker/L. Maricela Bo. Navigator contacted various transportation companies for patient to obtain some estimates and provided this information to patient along with a list of phone numbers for each company so that he and his wife can make a decision. Patient was very Patent attorney. It appears that Saturdays will cost extra and Navigator offered to call patient's OP HD clinic/[facility] for him in order to see if his schedule can be changed to MWF from TTS, but he declined, stating he would call himself. Navigator asked him to let Navigator know if he changed his mind and that Navigator will follow up with him tomorrow. Patient appreciative.  Steven Sellers, [place] Renal Navigator [phone]

## 2019-11-03 NOTE — Progress Notes (Signed)
[facility] Progress Note   Assessment/ Plan:   1. Hyperkalemia/ acute hypoxic RF: d/t missed dialysis.  Corrected. 2.ESRD TTS [facility], on schedule here.  HD today 6/24 3. 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 Nutrition: renal diet 6. Hypertension: expect to improve with UF, on low dose coreg now 7.  Afib: s/p amio gtt, cardiology following, no strong rec for AC 8.  Limb girdle muscular dystrophy 9.  Dispo: possibly today per bedside RN Appreciate palliative care and renal navigator   Subjective:    For HD today.  Sleeping, awakens to voice, no complaints.   Objective:   BP (!) 157/88 (BP Location: Right Arm)   Pulse 85   Temp 98.3 F (36.8 C) (Oral)   Resp 18   Ht 6\' 2"  (1.88 m)   Wt 59.2 kg   SpO2 97%   BMI 16.76 kg/m   Physical Exam: Gen: NAD, sitting in bed CVS: RRR Resp: muffled bases Abd: soft Ext: no LE edema ACCESS: LUE AVF, looks like some dependent upper extremity edema  Labs: BMET Recent Labs  Lab 10/27/19 1224 10/27/19 1224 10/27/19 1227 10/27/19 1515 10/28/19 0421 10/28/19 0429 10/28/19 1301 10/28/19 1802 10/29/19 0419 10/29/19 2033 10/30/19 0347 11/02/19 1437 11/03/19 0747  NA 138   < > 134*   < > 135 138  --   --  132* 133* 134* 138 139  K 7.9*   < > 7.6*   < > 4.4 4.5  --   --  5.0 3.4* 3.5 3.3* 4.6  CL 96*   < > 105  --   --  97*  --   --  95* 90* 92* 98 100  CO2 22  --   --   --   --  26  --   --  24 30 31 28 26   GLUCOSE 92   < > 95  --   --  99  --   --  150* 129* 113* 101* 101*  BUN 80*   < > 70*  --   --  21*  --   --  27* 8 11 20  28*  CREATININE 8.87*   < > 9.30*  --   --  4.01*  --   --  4.77* 2.01* 2.47* 3.49* 4.18*  CALCIUM 11.2*  --   --   --   --  8.9  --   --  8.9 8.7* 8.7* 9.2 9.5  PHOS  --   --   --   --   --   --  5.9* 5.7* 6.5* 2.9  --  3.0  --    < > = values in this interval not displayed.   CBC Recent Labs  Lab 10/27/19 1224  10/27/19 1227 10/28/19 0429 10/28/19 0429 10/29/19 0419 10/29/19 0419 10/29/19 1645 10/30/19 0347 11/02/19 1437 11/03/19 0747  WBC 18.7*   < > 14.1*   < > 6.5  --   --  6.5 8.5 8.3  NEUTROABS 17.2*  --  12.1*  --  5.2  --   --   --   --   --   HGB 9.0*   < > 7.2*   < > 6.1*   < > 8.5* 7.6* 8.4* 8.6*  HCT 29.3*   < > 23.7*   < > 20.0*   < > 26.2* 24.2* 27.6* 28.5*  MCV 102.8*   < >  103.5*   < > 102.6*  --   --  100.8* 103.4* 105.2*  PLT 473*   < > 336   < > 188  --   --  208 271 259   < > = values in this interval not displayed.      Medications:    . carvedilol  3.125 mg Oral BID WC  . Chlorhexidine Gluconate Cloth  6 each Topical Daily  . Chlorhexidine Gluconate Cloth  6 each Topical Q0600  . cholecalciferol  2,000 Units Oral Daily  . darbepoetin (ARANESP) injection - DIALYSIS  200 mcg Intravenous Q Sat-HD  . docusate sodium  100 mg Oral BID  . feeding supplement (NEPRO CARB STEADY)  237 mL Oral BID BM  . heparin  5,000 Units Subcutaneous Q8H  . insulin aspart  0-6 Units Subcutaneous TID AC & HS  . lidocaine-prilocaine  1 application Topical Once  . mouth rinse  15 mL Mouth Rinse BID  . multivitamin  1 tablet Oral QHS  . mupirocin ointment  1 application Nasal BID  . nicotine  21 mg Transdermal Daily  . sevelamer carbonate  3.2 g Oral TID WC  . Vitamin D (Ergocalciferol)  50,000 Units Oral Q7 days     Madelon Lips MD 11/03/2019, 11:18 AM

## 2019-11-04 ENCOUNTER — Telehealth: Payer: Self-pay

## 2019-11-04 DIAGNOSIS — S72461D Displaced supracondylar fracture with intracondylar extension of lower end of right femur, subsequent encounter for closed fracture with routine healing: Secondary | ICD-10-CM | POA: Diagnosis not present

## 2019-11-04 DIAGNOSIS — I132 Hypertensive heart and chronic kidney disease with heart failure and with stage 5 chronic kidney disease, or end stage renal disease: Secondary | ICD-10-CM | POA: Diagnosis not present

## 2019-11-04 DIAGNOSIS — D631 Anemia in chronic kidney disease: Secondary | ICD-10-CM | POA: Diagnosis not present

## 2019-11-04 DIAGNOSIS — G7109 Other specified muscular dystrophies: Secondary | ICD-10-CM | POA: Diagnosis not present

## 2019-11-04 DIAGNOSIS — N186 End stage renal disease: Secondary | ICD-10-CM | POA: Diagnosis not present

## 2019-11-04 DIAGNOSIS — I5032 Chronic diastolic (congestive) heart failure: Secondary | ICD-10-CM | POA: Diagnosis not present

## 2019-11-04 LAB — HEPATITIS B SURFACE ANTIBODY, QUANTITATIVE: Hep B S AB Quant (Post): 19.5 m[IU]/mL (ref >9.9)

## 2019-11-04 NOTE — Telephone Encounter (Signed)
-----   Message from [place], PA-C sent at 11/02/2019 10:45 AM EDT ----- Regarding: event monitor This patient was seen at [facility] for VT vs aflutter. H.o of afib not on a/c. He needs a 14 day event monitor at discharge, which I ordered. I am not sure when he will be discharged. But please call and arrange. He has a follow-up with Dr. Domenic Polite next month, he will likely need another one after the event monitor.   Thanks!

## 2019-11-04 NOTE — Telephone Encounter (Signed)
I called patient and explained the process that the [employer] will mail monitor to his home. I also told him there was 24/7 support form the company. I told him he should wear it for 14 days as described ion his discharge summary from the hospital.he has f/u with [place] on 11/29/19 at 1 pm with APP

## 2019-11-05 DIAGNOSIS — D509 Iron deficiency anemia, unspecified: Secondary | ICD-10-CM | POA: Diagnosis not present

## 2019-11-05 DIAGNOSIS — Z992 Dependence on renal dialysis: Secondary | ICD-10-CM | POA: Diagnosis not present

## 2019-11-05 DIAGNOSIS — N186 End stage renal disease: Secondary | ICD-10-CM | POA: Diagnosis not present

## 2019-11-05 DIAGNOSIS — D631 Anemia in chronic kidney disease: Secondary | ICD-10-CM | POA: Diagnosis not present

## 2019-11-05 DIAGNOSIS — N2581 Secondary hyperparathyroidism of renal origin: Secondary | ICD-10-CM | POA: Diagnosis not present

## 2019-11-07 DIAGNOSIS — I132 Hypertensive heart and chronic kidney disease with heart failure and with stage 5 chronic kidney disease, or end stage renal disease: Secondary | ICD-10-CM | POA: Diagnosis not present

## 2019-11-07 DIAGNOSIS — I5032 Chronic diastolic (congestive) heart failure: Secondary | ICD-10-CM | POA: Diagnosis not present

## 2019-11-07 DIAGNOSIS — N186 End stage renal disease: Secondary | ICD-10-CM | POA: Diagnosis not present

## 2019-11-07 MED FILL — Medication: Qty: 1 | Status: AC

## 2019-11-08 DIAGNOSIS — N2581 Secondary hyperparathyroidism of renal origin: Secondary | ICD-10-CM | POA: Diagnosis not present

## 2019-11-08 DIAGNOSIS — Z992 Dependence on renal dialysis: Secondary | ICD-10-CM | POA: Diagnosis not present

## 2019-11-08 DIAGNOSIS — N186 End stage renal disease: Secondary | ICD-10-CM | POA: Diagnosis not present

## 2019-11-08 DIAGNOSIS — D631 Anemia in chronic kidney disease: Secondary | ICD-10-CM | POA: Diagnosis not present

## 2019-11-08 DIAGNOSIS — D509 Iron deficiency anemia, unspecified: Secondary | ICD-10-CM | POA: Diagnosis not present

## 2019-11-09 DIAGNOSIS — N186 End stage renal disease: Secondary | ICD-10-CM | POA: Diagnosis not present

## 2019-11-09 DIAGNOSIS — S72461D Displaced supracondylar fracture with intracondylar extension of lower end of right femur, subsequent encounter for closed fracture with routine healing: Secondary | ICD-10-CM | POA: Diagnosis not present

## 2019-11-09 DIAGNOSIS — G7109 Other specified muscular dystrophies: Secondary | ICD-10-CM | POA: Diagnosis not present

## 2019-11-09 DIAGNOSIS — D631 Anemia in chronic kidney disease: Secondary | ICD-10-CM | POA: Diagnosis not present

## 2019-11-09 DIAGNOSIS — I132 Hypertensive heart and chronic kidney disease with heart failure and with stage 5 chronic kidney disease, or end stage renal disease: Secondary | ICD-10-CM | POA: Diagnosis not present

## 2019-11-09 DIAGNOSIS — I5032 Chronic diastolic (congestive) heart failure: Secondary | ICD-10-CM | POA: Diagnosis not present

## 2019-11-11 DIAGNOSIS — S72461D Displaced supracondylar fracture with intracondylar extension of lower end of right femur, subsequent encounter for closed fracture with routine healing: Secondary | ICD-10-CM | POA: Diagnosis not present

## 2019-11-11 DIAGNOSIS — N186 End stage renal disease: Secondary | ICD-10-CM | POA: Diagnosis not present

## 2019-11-11 DIAGNOSIS — I132 Hypertensive heart and chronic kidney disease with heart failure and with stage 5 chronic kidney disease, or end stage renal disease: Secondary | ICD-10-CM | POA: Diagnosis not present

## 2019-11-11 DIAGNOSIS — I5032 Chronic diastolic (congestive) heart failure: Secondary | ICD-10-CM | POA: Diagnosis not present

## 2019-11-11 DIAGNOSIS — D631 Anemia in chronic kidney disease: Secondary | ICD-10-CM | POA: Diagnosis not present

## 2019-11-11 DIAGNOSIS — G7109 Other specified muscular dystrophies: Secondary | ICD-10-CM | POA: Diagnosis not present

## 2019-11-12 DIAGNOSIS — D509 Iron deficiency anemia, unspecified: Secondary | ICD-10-CM | POA: Diagnosis not present

## 2019-11-12 DIAGNOSIS — Z992 Dependence on renal dialysis: Secondary | ICD-10-CM | POA: Diagnosis not present

## 2019-11-12 DIAGNOSIS — N2581 Secondary hyperparathyroidism of renal origin: Secondary | ICD-10-CM | POA: Diagnosis not present

## 2019-11-12 DIAGNOSIS — D631 Anemia in chronic kidney disease: Secondary | ICD-10-CM | POA: Diagnosis not present

## 2019-11-12 DIAGNOSIS — N186 End stage renal disease: Secondary | ICD-10-CM | POA: Diagnosis not present

## 2019-11-15 DIAGNOSIS — Z992 Dependence on renal dialysis: Secondary | ICD-10-CM | POA: Diagnosis not present

## 2019-11-15 DIAGNOSIS — D509 Iron deficiency anemia, unspecified: Secondary | ICD-10-CM | POA: Diagnosis not present

## 2019-11-15 DIAGNOSIS — N186 End stage renal disease: Secondary | ICD-10-CM | POA: Diagnosis not present

## 2019-11-15 DIAGNOSIS — N2581 Secondary hyperparathyroidism of renal origin: Secondary | ICD-10-CM | POA: Diagnosis not present

## 2019-11-15 DIAGNOSIS — D631 Anemia in chronic kidney disease: Secondary | ICD-10-CM | POA: Diagnosis not present

## 2019-11-16 DIAGNOSIS — D631 Anemia in chronic kidney disease: Secondary | ICD-10-CM | POA: Diagnosis not present

## 2019-11-16 DIAGNOSIS — N186 End stage renal disease: Secondary | ICD-10-CM | POA: Diagnosis not present

## 2019-11-16 DIAGNOSIS — G7109 Other specified muscular dystrophies: Secondary | ICD-10-CM | POA: Diagnosis not present

## 2019-11-16 DIAGNOSIS — S72461D Displaced supracondylar fracture with intracondylar extension of lower end of right femur, subsequent encounter for closed fracture with routine healing: Secondary | ICD-10-CM | POA: Diagnosis not present

## 2019-11-16 DIAGNOSIS — I132 Hypertensive heart and chronic kidney disease with heart failure and with stage 5 chronic kidney disease, or end stage renal disease: Secondary | ICD-10-CM | POA: Diagnosis not present

## 2019-11-16 DIAGNOSIS — I5032 Chronic diastolic (congestive) heart failure: Secondary | ICD-10-CM | POA: Diagnosis not present

## 2019-11-17 DIAGNOSIS — N2581 Secondary hyperparathyroidism of renal origin: Secondary | ICD-10-CM | POA: Diagnosis not present

## 2019-11-17 DIAGNOSIS — Z992 Dependence on renal dialysis: Secondary | ICD-10-CM | POA: Diagnosis not present

## 2019-11-17 DIAGNOSIS — D631 Anemia in chronic kidney disease: Secondary | ICD-10-CM | POA: Diagnosis not present

## 2019-11-17 DIAGNOSIS — N186 End stage renal disease: Secondary | ICD-10-CM | POA: Diagnosis not present

## 2019-11-17 DIAGNOSIS — D509 Iron deficiency anemia, unspecified: Secondary | ICD-10-CM | POA: Diagnosis not present

## 2019-11-18 DIAGNOSIS — S72461D Displaced supracondylar fracture with intracondylar extension of lower end of right femur, subsequent encounter for closed fracture with routine healing: Secondary | ICD-10-CM | POA: Diagnosis not present

## 2019-11-18 DIAGNOSIS — N186 End stage renal disease: Secondary | ICD-10-CM | POA: Diagnosis not present

## 2019-11-18 DIAGNOSIS — I5032 Chronic diastolic (congestive) heart failure: Secondary | ICD-10-CM | POA: Diagnosis not present

## 2019-11-18 DIAGNOSIS — G7109 Other specified muscular dystrophies: Secondary | ICD-10-CM | POA: Diagnosis not present

## 2019-11-18 DIAGNOSIS — I132 Hypertensive heart and chronic kidney disease with heart failure and with stage 5 chronic kidney disease, or end stage renal disease: Secondary | ICD-10-CM | POA: Diagnosis not present

## 2019-11-18 DIAGNOSIS — D631 Anemia in chronic kidney disease: Secondary | ICD-10-CM | POA: Diagnosis not present

## 2019-11-19 DIAGNOSIS — N186 End stage renal disease: Secondary | ICD-10-CM | POA: Diagnosis not present

## 2019-11-19 DIAGNOSIS — D631 Anemia in chronic kidney disease: Secondary | ICD-10-CM | POA: Diagnosis not present

## 2019-11-19 DIAGNOSIS — N2581 Secondary hyperparathyroidism of renal origin: Secondary | ICD-10-CM | POA: Diagnosis not present

## 2019-11-19 DIAGNOSIS — D509 Iron deficiency anemia, unspecified: Secondary | ICD-10-CM | POA: Diagnosis not present

## 2019-11-19 DIAGNOSIS — Z992 Dependence on renal dialysis: Secondary | ICD-10-CM | POA: Diagnosis not present

## 2019-11-22 DIAGNOSIS — D509 Iron deficiency anemia, unspecified: Secondary | ICD-10-CM | POA: Diagnosis not present

## 2019-11-22 DIAGNOSIS — D631 Anemia in chronic kidney disease: Secondary | ICD-10-CM | POA: Diagnosis not present

## 2019-11-22 DIAGNOSIS — Z992 Dependence on renal dialysis: Secondary | ICD-10-CM | POA: Diagnosis not present

## 2019-11-22 DIAGNOSIS — N186 End stage renal disease: Secondary | ICD-10-CM | POA: Diagnosis not present

## 2019-11-22 DIAGNOSIS — N2581 Secondary hyperparathyroidism of renal origin: Secondary | ICD-10-CM | POA: Diagnosis not present

## 2019-11-23 DIAGNOSIS — S72461D Displaced supracondylar fracture with intracondylar extension of lower end of right femur, subsequent encounter for closed fracture with routine healing: Secondary | ICD-10-CM | POA: Diagnosis not present

## 2019-11-23 DIAGNOSIS — G7109 Other specified muscular dystrophies: Secondary | ICD-10-CM | POA: Diagnosis not present

## 2019-11-23 DIAGNOSIS — N186 End stage renal disease: Secondary | ICD-10-CM | POA: Diagnosis not present

## 2019-11-23 DIAGNOSIS — D631 Anemia in chronic kidney disease: Secondary | ICD-10-CM | POA: Diagnosis not present

## 2019-11-23 DIAGNOSIS — I132 Hypertensive heart and chronic kidney disease with heart failure and with stage 5 chronic kidney disease, or end stage renal disease: Secondary | ICD-10-CM | POA: Diagnosis not present

## 2019-11-23 DIAGNOSIS — I5032 Chronic diastolic (congestive) heart failure: Secondary | ICD-10-CM | POA: Diagnosis not present

## 2019-11-24 DIAGNOSIS — Z992 Dependence on renal dialysis: Secondary | ICD-10-CM | POA: Diagnosis not present

## 2019-11-24 DIAGNOSIS — N186 End stage renal disease: Secondary | ICD-10-CM | POA: Diagnosis not present

## 2019-11-24 DIAGNOSIS — D509 Iron deficiency anemia, unspecified: Secondary | ICD-10-CM | POA: Diagnosis not present

## 2019-11-24 DIAGNOSIS — N2581 Secondary hyperparathyroidism of renal origin: Secondary | ICD-10-CM | POA: Diagnosis not present

## 2019-11-24 DIAGNOSIS — D631 Anemia in chronic kidney disease: Secondary | ICD-10-CM | POA: Diagnosis not present

## 2019-11-26 DIAGNOSIS — N186 End stage renal disease: Secondary | ICD-10-CM | POA: Diagnosis not present

## 2019-11-26 DIAGNOSIS — N2581 Secondary hyperparathyroidism of renal origin: Secondary | ICD-10-CM | POA: Diagnosis not present

## 2019-11-26 DIAGNOSIS — D509 Iron deficiency anemia, unspecified: Secondary | ICD-10-CM | POA: Diagnosis not present

## 2019-11-26 DIAGNOSIS — D631 Anemia in chronic kidney disease: Secondary | ICD-10-CM | POA: Diagnosis not present

## 2019-11-26 DIAGNOSIS — Z992 Dependence on renal dialysis: Secondary | ICD-10-CM | POA: Diagnosis not present

## 2019-11-29 ENCOUNTER — Ambulatory Visit: Payer: Medicare Other | Admitting: Student

## 2019-11-29 DIAGNOSIS — Z992 Dependence on renal dialysis: Secondary | ICD-10-CM | POA: Diagnosis not present

## 2019-11-29 DIAGNOSIS — D631 Anemia in chronic kidney disease: Secondary | ICD-10-CM | POA: Diagnosis not present

## 2019-11-29 DIAGNOSIS — N186 End stage renal disease: Secondary | ICD-10-CM | POA: Diagnosis not present

## 2019-11-29 DIAGNOSIS — N2581 Secondary hyperparathyroidism of renal origin: Secondary | ICD-10-CM | POA: Diagnosis not present

## 2019-11-29 DIAGNOSIS — D509 Iron deficiency anemia, unspecified: Secondary | ICD-10-CM | POA: Diagnosis not present

## 2019-12-01 DIAGNOSIS — N2581 Secondary hyperparathyroidism of renal origin: Secondary | ICD-10-CM | POA: Diagnosis not present

## 2019-12-01 DIAGNOSIS — D509 Iron deficiency anemia, unspecified: Secondary | ICD-10-CM | POA: Diagnosis not present

## 2019-12-01 DIAGNOSIS — D631 Anemia in chronic kidney disease: Secondary | ICD-10-CM | POA: Diagnosis not present

## 2019-12-01 DIAGNOSIS — Z992 Dependence on renal dialysis: Secondary | ICD-10-CM | POA: Diagnosis not present

## 2019-12-01 DIAGNOSIS — N186 End stage renal disease: Secondary | ICD-10-CM | POA: Diagnosis not present

## 2019-12-03 DIAGNOSIS — D631 Anemia in chronic kidney disease: Secondary | ICD-10-CM | POA: Diagnosis not present

## 2019-12-03 DIAGNOSIS — D509 Iron deficiency anemia, unspecified: Secondary | ICD-10-CM | POA: Diagnosis not present

## 2019-12-03 DIAGNOSIS — Z992 Dependence on renal dialysis: Secondary | ICD-10-CM | POA: Diagnosis not present

## 2019-12-03 DIAGNOSIS — N186 End stage renal disease: Secondary | ICD-10-CM | POA: Diagnosis not present

## 2019-12-03 DIAGNOSIS — N2581 Secondary hyperparathyroidism of renal origin: Secondary | ICD-10-CM | POA: Diagnosis not present

## 2019-12-05 DIAGNOSIS — N186 End stage renal disease: Secondary | ICD-10-CM | POA: Diagnosis not present

## 2019-12-05 DIAGNOSIS — Z515 Encounter for palliative care: Secondary | ICD-10-CM | POA: Diagnosis not present

## 2019-12-05 DIAGNOSIS — G7109 Other specified muscular dystrophies: Secondary | ICD-10-CM | POA: Diagnosis not present

## 2019-12-05 DIAGNOSIS — Z992 Dependence on renal dialysis: Secondary | ICD-10-CM | POA: Diagnosis not present

## 2019-12-06 ENCOUNTER — Telehealth: Payer: Self-pay

## 2019-12-06 DIAGNOSIS — N2581 Secondary hyperparathyroidism of renal origin: Secondary | ICD-10-CM | POA: Diagnosis not present

## 2019-12-06 DIAGNOSIS — D631 Anemia in chronic kidney disease: Secondary | ICD-10-CM | POA: Diagnosis not present

## 2019-12-06 DIAGNOSIS — N186 End stage renal disease: Secondary | ICD-10-CM | POA: Diagnosis not present

## 2019-12-06 DIAGNOSIS — Z992 Dependence on renal dialysis: Secondary | ICD-10-CM | POA: Diagnosis not present

## 2019-12-06 DIAGNOSIS — D509 Iron deficiency anemia, unspecified: Secondary | ICD-10-CM | POA: Diagnosis not present

## 2019-12-06 NOTE — Telephone Encounter (Signed)
Received fax that monitor had never been activated for patient. I called and spoke with patient's wife.She reports he never wore monitor and placed it back in the mail.He also cancelled apt with [place] for follow up.

## 2019-12-08 DIAGNOSIS — Z992 Dependence on renal dialysis: Secondary | ICD-10-CM | POA: Diagnosis not present

## 2019-12-08 DIAGNOSIS — D631 Anemia in chronic kidney disease: Secondary | ICD-10-CM | POA: Diagnosis not present

## 2019-12-08 DIAGNOSIS — D509 Iron deficiency anemia, unspecified: Secondary | ICD-10-CM | POA: Diagnosis not present

## 2019-12-08 DIAGNOSIS — N2581 Secondary hyperparathyroidism of renal origin: Secondary | ICD-10-CM | POA: Diagnosis not present

## 2019-12-08 DIAGNOSIS — N186 End stage renal disease: Secondary | ICD-10-CM | POA: Diagnosis not present

## 2019-12-10 DIAGNOSIS — D631 Anemia in chronic kidney disease: Secondary | ICD-10-CM | POA: Diagnosis not present

## 2019-12-10 DIAGNOSIS — N2581 Secondary hyperparathyroidism of renal origin: Secondary | ICD-10-CM | POA: Diagnosis not present

## 2019-12-10 DIAGNOSIS — D509 Iron deficiency anemia, unspecified: Secondary | ICD-10-CM | POA: Diagnosis not present

## 2019-12-10 DIAGNOSIS — Z992 Dependence on renal dialysis: Secondary | ICD-10-CM | POA: Diagnosis not present

## 2019-12-10 DIAGNOSIS — N186 End stage renal disease: Secondary | ICD-10-CM | POA: Diagnosis not present

## 2019-12-13 DIAGNOSIS — N2581 Secondary hyperparathyroidism of renal origin: Secondary | ICD-10-CM | POA: Diagnosis not present

## 2019-12-13 DIAGNOSIS — N186 End stage renal disease: Secondary | ICD-10-CM | POA: Diagnosis not present

## 2019-12-13 DIAGNOSIS — Z992 Dependence on renal dialysis: Secondary | ICD-10-CM | POA: Diagnosis not present

## 2019-12-13 DIAGNOSIS — D631 Anemia in chronic kidney disease: Secondary | ICD-10-CM | POA: Diagnosis not present

## 2019-12-13 DIAGNOSIS — D509 Iron deficiency anemia, unspecified: Secondary | ICD-10-CM | POA: Diagnosis not present

## 2019-12-15 DIAGNOSIS — D631 Anemia in chronic kidney disease: Secondary | ICD-10-CM | POA: Diagnosis not present

## 2019-12-15 DIAGNOSIS — N186 End stage renal disease: Secondary | ICD-10-CM | POA: Diagnosis not present

## 2019-12-15 DIAGNOSIS — N2581 Secondary hyperparathyroidism of renal origin: Secondary | ICD-10-CM | POA: Diagnosis not present

## 2019-12-15 DIAGNOSIS — D509 Iron deficiency anemia, unspecified: Secondary | ICD-10-CM | POA: Diagnosis not present

## 2019-12-15 DIAGNOSIS — Z992 Dependence on renal dialysis: Secondary | ICD-10-CM | POA: Diagnosis not present

## 2019-12-17 DIAGNOSIS — D509 Iron deficiency anemia, unspecified: Secondary | ICD-10-CM | POA: Diagnosis not present

## 2019-12-17 DIAGNOSIS — Z992 Dependence on renal dialysis: Secondary | ICD-10-CM | POA: Diagnosis not present

## 2019-12-17 DIAGNOSIS — N2581 Secondary hyperparathyroidism of renal origin: Secondary | ICD-10-CM | POA: Diagnosis not present

## 2019-12-17 DIAGNOSIS — D631 Anemia in chronic kidney disease: Secondary | ICD-10-CM | POA: Diagnosis not present

## 2019-12-17 DIAGNOSIS — N186 End stage renal disease: Secondary | ICD-10-CM | POA: Diagnosis not present

## 2019-12-20 DIAGNOSIS — N2581 Secondary hyperparathyroidism of renal origin: Secondary | ICD-10-CM | POA: Diagnosis not present

## 2019-12-20 DIAGNOSIS — Z992 Dependence on renal dialysis: Secondary | ICD-10-CM | POA: Diagnosis not present

## 2019-12-20 DIAGNOSIS — N186 End stage renal disease: Secondary | ICD-10-CM | POA: Diagnosis not present

## 2019-12-20 DIAGNOSIS — D631 Anemia in chronic kidney disease: Secondary | ICD-10-CM | POA: Diagnosis not present

## 2019-12-20 DIAGNOSIS — D509 Iron deficiency anemia, unspecified: Secondary | ICD-10-CM | POA: Diagnosis not present

## 2019-12-22 DIAGNOSIS — N2581 Secondary hyperparathyroidism of renal origin: Secondary | ICD-10-CM | POA: Diagnosis not present

## 2019-12-22 DIAGNOSIS — D509 Iron deficiency anemia, unspecified: Secondary | ICD-10-CM | POA: Diagnosis not present

## 2019-12-22 DIAGNOSIS — Z992 Dependence on renal dialysis: Secondary | ICD-10-CM | POA: Diagnosis not present

## 2019-12-22 DIAGNOSIS — D631 Anemia in chronic kidney disease: Secondary | ICD-10-CM | POA: Diagnosis not present

## 2019-12-22 DIAGNOSIS — N186 End stage renal disease: Secondary | ICD-10-CM | POA: Diagnosis not present

## 2019-12-24 DIAGNOSIS — D631 Anemia in chronic kidney disease: Secondary | ICD-10-CM | POA: Diagnosis not present

## 2019-12-24 DIAGNOSIS — Z992 Dependence on renal dialysis: Secondary | ICD-10-CM | POA: Diagnosis not present

## 2019-12-24 DIAGNOSIS — D509 Iron deficiency anemia, unspecified: Secondary | ICD-10-CM | POA: Diagnosis not present

## 2019-12-24 DIAGNOSIS — N2581 Secondary hyperparathyroidism of renal origin: Secondary | ICD-10-CM | POA: Diagnosis not present

## 2019-12-24 DIAGNOSIS — N186 End stage renal disease: Secondary | ICD-10-CM | POA: Diagnosis not present

## 2019-12-27 DIAGNOSIS — N186 End stage renal disease: Secondary | ICD-10-CM | POA: Diagnosis not present

## 2019-12-27 DIAGNOSIS — D631 Anemia in chronic kidney disease: Secondary | ICD-10-CM | POA: Diagnosis not present

## 2019-12-27 DIAGNOSIS — D509 Iron deficiency anemia, unspecified: Secondary | ICD-10-CM | POA: Diagnosis not present

## 2019-12-27 DIAGNOSIS — Z992 Dependence on renal dialysis: Secondary | ICD-10-CM | POA: Diagnosis not present

## 2019-12-27 DIAGNOSIS — N2581 Secondary hyperparathyroidism of renal origin: Secondary | ICD-10-CM | POA: Diagnosis not present

## 2019-12-29 DIAGNOSIS — N2581 Secondary hyperparathyroidism of renal origin: Secondary | ICD-10-CM | POA: Diagnosis not present

## 2019-12-29 DIAGNOSIS — D509 Iron deficiency anemia, unspecified: Secondary | ICD-10-CM | POA: Diagnosis not present

## 2019-12-29 DIAGNOSIS — D631 Anemia in chronic kidney disease: Secondary | ICD-10-CM | POA: Diagnosis not present

## 2019-12-29 DIAGNOSIS — Z992 Dependence on renal dialysis: Secondary | ICD-10-CM | POA: Diagnosis not present

## 2019-12-29 DIAGNOSIS — N186 End stage renal disease: Secondary | ICD-10-CM | POA: Diagnosis not present

## 2019-12-31 DIAGNOSIS — N2581 Secondary hyperparathyroidism of renal origin: Secondary | ICD-10-CM | POA: Diagnosis not present

## 2019-12-31 DIAGNOSIS — N186 End stage renal disease: Secondary | ICD-10-CM | POA: Diagnosis not present

## 2019-12-31 DIAGNOSIS — Z992 Dependence on renal dialysis: Secondary | ICD-10-CM | POA: Diagnosis not present

## 2019-12-31 DIAGNOSIS — D509 Iron deficiency anemia, unspecified: Secondary | ICD-10-CM | POA: Diagnosis not present

## 2019-12-31 DIAGNOSIS — D631 Anemia in chronic kidney disease: Secondary | ICD-10-CM | POA: Diagnosis not present

## 2020-01-03 DIAGNOSIS — N186 End stage renal disease: Secondary | ICD-10-CM | POA: Diagnosis not present

## 2020-01-03 DIAGNOSIS — Z992 Dependence on renal dialysis: Secondary | ICD-10-CM | POA: Diagnosis not present

## 2020-01-03 DIAGNOSIS — N2581 Secondary hyperparathyroidism of renal origin: Secondary | ICD-10-CM | POA: Diagnosis not present

## 2020-01-03 DIAGNOSIS — D509 Iron deficiency anemia, unspecified: Secondary | ICD-10-CM | POA: Diagnosis not present

## 2020-01-03 DIAGNOSIS — D631 Anemia in chronic kidney disease: Secondary | ICD-10-CM | POA: Diagnosis not present

## 2020-01-04 DIAGNOSIS — N186 End stage renal disease: Secondary | ICD-10-CM | POA: Diagnosis not present

## 2020-01-04 DIAGNOSIS — G7109 Other specified muscular dystrophies: Secondary | ICD-10-CM | POA: Diagnosis not present

## 2020-01-04 DIAGNOSIS — Z515 Encounter for palliative care: Secondary | ICD-10-CM | POA: Diagnosis not present

## 2020-01-04 DIAGNOSIS — J449 Chronic obstructive pulmonary disease, unspecified: Secondary | ICD-10-CM | POA: Diagnosis not present

## 2020-01-05 DIAGNOSIS — D631 Anemia in chronic kidney disease: Secondary | ICD-10-CM | POA: Diagnosis not present

## 2020-01-05 DIAGNOSIS — Z992 Dependence on renal dialysis: Secondary | ICD-10-CM | POA: Diagnosis not present

## 2020-01-05 DIAGNOSIS — N2581 Secondary hyperparathyroidism of renal origin: Secondary | ICD-10-CM | POA: Diagnosis not present

## 2020-01-05 DIAGNOSIS — N186 End stage renal disease: Secondary | ICD-10-CM | POA: Diagnosis not present

## 2020-01-05 DIAGNOSIS — D509 Iron deficiency anemia, unspecified: Secondary | ICD-10-CM | POA: Diagnosis not present

## 2020-01-07 DIAGNOSIS — D631 Anemia in chronic kidney disease: Secondary | ICD-10-CM | POA: Diagnosis not present

## 2020-01-07 DIAGNOSIS — N186 End stage renal disease: Secondary | ICD-10-CM | POA: Diagnosis not present

## 2020-01-07 DIAGNOSIS — Z992 Dependence on renal dialysis: Secondary | ICD-10-CM | POA: Diagnosis not present

## 2020-01-07 DIAGNOSIS — D509 Iron deficiency anemia, unspecified: Secondary | ICD-10-CM | POA: Diagnosis not present

## 2020-01-07 DIAGNOSIS — N2581 Secondary hyperparathyroidism of renal origin: Secondary | ICD-10-CM | POA: Diagnosis not present

## 2020-01-10 DIAGNOSIS — D509 Iron deficiency anemia, unspecified: Secondary | ICD-10-CM | POA: Diagnosis not present

## 2020-01-10 DIAGNOSIS — G894 Chronic pain syndrome: Secondary | ICD-10-CM | POA: Diagnosis not present

## 2020-01-10 DIAGNOSIS — N186 End stage renal disease: Secondary | ICD-10-CM | POA: Diagnosis not present

## 2020-01-10 DIAGNOSIS — N2581 Secondary hyperparathyroidism of renal origin: Secondary | ICD-10-CM | POA: Diagnosis not present

## 2020-01-10 DIAGNOSIS — D631 Anemia in chronic kidney disease: Secondary | ICD-10-CM | POA: Diagnosis not present

## 2020-01-10 DIAGNOSIS — Z992 Dependence on renal dialysis: Secondary | ICD-10-CM | POA: Diagnosis not present

## 2020-01-12 DIAGNOSIS — N186 End stage renal disease: Secondary | ICD-10-CM | POA: Diagnosis not present

## 2020-01-12 DIAGNOSIS — D509 Iron deficiency anemia, unspecified: Secondary | ICD-10-CM | POA: Diagnosis not present

## 2020-01-12 DIAGNOSIS — D631 Anemia in chronic kidney disease: Secondary | ICD-10-CM | POA: Diagnosis not present

## 2020-01-12 DIAGNOSIS — Z992 Dependence on renal dialysis: Secondary | ICD-10-CM | POA: Diagnosis not present

## 2020-01-14 DIAGNOSIS — Z992 Dependence on renal dialysis: Secondary | ICD-10-CM | POA: Diagnosis not present

## 2020-01-14 DIAGNOSIS — N186 End stage renal disease: Secondary | ICD-10-CM | POA: Diagnosis not present

## 2020-01-14 DIAGNOSIS — D631 Anemia in chronic kidney disease: Secondary | ICD-10-CM | POA: Diagnosis not present

## 2020-01-14 DIAGNOSIS — D509 Iron deficiency anemia, unspecified: Secondary | ICD-10-CM | POA: Diagnosis not present

## 2020-01-17 DIAGNOSIS — N186 End stage renal disease: Secondary | ICD-10-CM | POA: Diagnosis not present

## 2020-01-17 DIAGNOSIS — D509 Iron deficiency anemia, unspecified: Secondary | ICD-10-CM | POA: Diagnosis not present

## 2020-01-17 DIAGNOSIS — Z992 Dependence on renal dialysis: Secondary | ICD-10-CM | POA: Diagnosis not present

## 2020-01-17 DIAGNOSIS — D631 Anemia in chronic kidney disease: Secondary | ICD-10-CM | POA: Diagnosis not present

## 2020-01-19 DIAGNOSIS — D509 Iron deficiency anemia, unspecified: Secondary | ICD-10-CM | POA: Diagnosis not present

## 2020-01-19 DIAGNOSIS — N186 End stage renal disease: Secondary | ICD-10-CM | POA: Diagnosis not present

## 2020-01-19 DIAGNOSIS — Z992 Dependence on renal dialysis: Secondary | ICD-10-CM | POA: Diagnosis not present

## 2020-01-19 DIAGNOSIS — D631 Anemia in chronic kidney disease: Secondary | ICD-10-CM | POA: Diagnosis not present

## 2020-01-20 DIAGNOSIS — I132 Hypertensive heart and chronic kidney disease with heart failure and with stage 5 chronic kidney disease, or end stage renal disease: Secondary | ICD-10-CM | POA: Diagnosis not present

## 2020-01-20 DIAGNOSIS — I480 Paroxysmal atrial fibrillation: Secondary | ICD-10-CM | POA: Diagnosis not present

## 2020-01-20 DIAGNOSIS — E785 Hyperlipidemia, unspecified: Secondary | ICD-10-CM | POA: Diagnosis not present

## 2020-01-20 DIAGNOSIS — I5032 Chronic diastolic (congestive) heart failure: Secondary | ICD-10-CM | POA: Diagnosis not present

## 2020-01-21 DIAGNOSIS — N186 End stage renal disease: Secondary | ICD-10-CM | POA: Diagnosis not present

## 2020-01-21 DIAGNOSIS — D509 Iron deficiency anemia, unspecified: Secondary | ICD-10-CM | POA: Diagnosis not present

## 2020-01-21 DIAGNOSIS — Z992 Dependence on renal dialysis: Secondary | ICD-10-CM | POA: Diagnosis not present

## 2020-01-21 DIAGNOSIS — D631 Anemia in chronic kidney disease: Secondary | ICD-10-CM | POA: Diagnosis not present

## 2020-01-24 DIAGNOSIS — Z992 Dependence on renal dialysis: Secondary | ICD-10-CM | POA: Diagnosis not present

## 2020-01-24 DIAGNOSIS — D509 Iron deficiency anemia, unspecified: Secondary | ICD-10-CM | POA: Diagnosis not present

## 2020-01-24 DIAGNOSIS — N186 End stage renal disease: Secondary | ICD-10-CM | POA: Diagnosis not present

## 2020-01-24 DIAGNOSIS — D631 Anemia in chronic kidney disease: Secondary | ICD-10-CM | POA: Diagnosis not present

## 2020-01-28 DIAGNOSIS — N186 End stage renal disease: Secondary | ICD-10-CM | POA: Diagnosis not present

## 2020-01-28 DIAGNOSIS — D509 Iron deficiency anemia, unspecified: Secondary | ICD-10-CM | POA: Diagnosis not present

## 2020-01-28 DIAGNOSIS — D631 Anemia in chronic kidney disease: Secondary | ICD-10-CM | POA: Diagnosis not present

## 2020-01-28 DIAGNOSIS — Z992 Dependence on renal dialysis: Secondary | ICD-10-CM | POA: Diagnosis not present

## 2020-01-31 DIAGNOSIS — N186 End stage renal disease: Secondary | ICD-10-CM | POA: Diagnosis not present

## 2020-01-31 DIAGNOSIS — Z992 Dependence on renal dialysis: Secondary | ICD-10-CM | POA: Diagnosis not present

## 2020-01-31 DIAGNOSIS — G894 Chronic pain syndrome: Secondary | ICD-10-CM | POA: Diagnosis not present

## 2020-01-31 DIAGNOSIS — D631 Anemia in chronic kidney disease: Secondary | ICD-10-CM | POA: Diagnosis not present

## 2020-01-31 DIAGNOSIS — D509 Iron deficiency anemia, unspecified: Secondary | ICD-10-CM | POA: Diagnosis not present

## 2020-02-01 ENCOUNTER — Emergency Department (HOSPITAL_COMMUNITY): Payer: Medicare Other

## 2020-02-01 ENCOUNTER — Inpatient Hospital Stay (HOSPITAL_COMMUNITY)
Admission: EM | Admit: 2020-02-01 | Discharge: 2020-02-10 | DRG: 871 | Disposition: E | Payer: Medicare Other | Attending: Internal Medicine | Admitting: Internal Medicine

## 2020-02-01 DIAGNOSIS — G894 Chronic pain syndrome: Secondary | ICD-10-CM | POA: Diagnosis present

## 2020-02-01 DIAGNOSIS — R52 Pain, unspecified: Secondary | ICD-10-CM | POA: Diagnosis not present

## 2020-02-01 DIAGNOSIS — R6520 Severe sepsis without septic shock: Secondary | ICD-10-CM | POA: Diagnosis not present

## 2020-02-01 DIAGNOSIS — Z515 Encounter for palliative care: Secondary | ICD-10-CM | POA: Diagnosis not present

## 2020-02-01 DIAGNOSIS — J690 Pneumonitis due to inhalation of food and vomit: Secondary | ICD-10-CM

## 2020-02-01 DIAGNOSIS — Z79899 Other long term (current) drug therapy: Secondary | ICD-10-CM | POA: Diagnosis not present

## 2020-02-01 DIAGNOSIS — E162 Hypoglycemia, unspecified: Secondary | ICD-10-CM | POA: Diagnosis present

## 2020-02-01 DIAGNOSIS — I509 Heart failure, unspecified: Secondary | ICD-10-CM | POA: Diagnosis not present

## 2020-02-01 DIAGNOSIS — I5032 Chronic diastolic (congestive) heart failure: Secondary | ICD-10-CM | POA: Diagnosis present

## 2020-02-01 DIAGNOSIS — N186 End stage renal disease: Secondary | ICD-10-CM | POA: Diagnosis not present

## 2020-02-01 DIAGNOSIS — F17210 Nicotine dependence, cigarettes, uncomplicated: Secondary | ICD-10-CM | POA: Diagnosis present

## 2020-02-01 DIAGNOSIS — Y95 Nosocomial condition: Secondary | ICD-10-CM | POA: Diagnosis present

## 2020-02-01 DIAGNOSIS — R197 Diarrhea, unspecified: Secondary | ICD-10-CM | POA: Diagnosis not present

## 2020-02-01 DIAGNOSIS — Z66 Do not resuscitate: Secondary | ICD-10-CM | POA: Diagnosis not present

## 2020-02-01 DIAGNOSIS — E559 Vitamin D deficiency, unspecified: Secondary | ICD-10-CM | POA: Diagnosis present

## 2020-02-01 DIAGNOSIS — Z86711 Personal history of pulmonary embolism: Secondary | ICD-10-CM | POA: Diagnosis not present

## 2020-02-01 DIAGNOSIS — R000 Tachycardia, unspecified: Secondary | ICD-10-CM | POA: Diagnosis not present

## 2020-02-01 DIAGNOSIS — Z720 Tobacco use: Secondary | ICD-10-CM | POA: Diagnosis not present

## 2020-02-01 DIAGNOSIS — D631 Anemia in chronic kidney disease: Secondary | ICD-10-CM | POA: Diagnosis present

## 2020-02-01 DIAGNOSIS — R0902 Hypoxemia: Secondary | ICD-10-CM | POA: Diagnosis not present

## 2020-02-01 DIAGNOSIS — G4700 Insomnia, unspecified: Secondary | ICD-10-CM | POA: Diagnosis present

## 2020-02-01 DIAGNOSIS — R069 Unspecified abnormalities of breathing: Secondary | ICD-10-CM | POA: Diagnosis not present

## 2020-02-01 DIAGNOSIS — R64 Cachexia: Secondary | ICD-10-CM | POA: Diagnosis present

## 2020-02-01 DIAGNOSIS — R0603 Acute respiratory distress: Secondary | ICD-10-CM | POA: Diagnosis not present

## 2020-02-01 DIAGNOSIS — J90 Pleural effusion, not elsewhere classified: Secondary | ICD-10-CM | POA: Diagnosis not present

## 2020-02-01 DIAGNOSIS — G40909 Epilepsy, unspecified, not intractable, without status epilepticus: Secondary | ICD-10-CM | POA: Diagnosis present

## 2020-02-01 DIAGNOSIS — I132 Hypertensive heart and chronic kidney disease with heart failure and with stage 5 chronic kidney disease, or end stage renal disease: Secondary | ICD-10-CM | POA: Diagnosis present

## 2020-02-01 DIAGNOSIS — J80 Acute respiratory distress syndrome: Secondary | ICD-10-CM | POA: Diagnosis not present

## 2020-02-01 DIAGNOSIS — R627 Adult failure to thrive: Secondary | ICD-10-CM | POA: Diagnosis present

## 2020-02-01 DIAGNOSIS — G8929 Other chronic pain: Secondary | ICD-10-CM | POA: Diagnosis not present

## 2020-02-01 DIAGNOSIS — J189 Pneumonia, unspecified organism: Secondary | ICD-10-CM | POA: Diagnosis not present

## 2020-02-01 DIAGNOSIS — E8809 Other disorders of plasma-protein metabolism, not elsewhere classified: Secondary | ICD-10-CM | POA: Diagnosis not present

## 2020-02-01 DIAGNOSIS — Z681 Body mass index (BMI) 19 or less, adult: Secondary | ICD-10-CM | POA: Diagnosis not present

## 2020-02-01 DIAGNOSIS — E8770 Fluid overload, unspecified: Secondary | ICD-10-CM | POA: Diagnosis not present

## 2020-02-01 DIAGNOSIS — R1110 Vomiting, unspecified: Secondary | ICD-10-CM | POA: Diagnosis not present

## 2020-02-01 DIAGNOSIS — A419 Sepsis, unspecified organism: Principal | ICD-10-CM | POA: Diagnosis present

## 2020-02-01 DIAGNOSIS — Z20822 Contact with and (suspected) exposure to covid-19: Secondary | ICD-10-CM | POA: Diagnosis present

## 2020-02-01 DIAGNOSIS — J9601 Acute respiratory failure with hypoxia: Secondary | ICD-10-CM

## 2020-02-01 DIAGNOSIS — Z992 Dependence on renal dialysis: Secondary | ICD-10-CM

## 2020-02-01 DIAGNOSIS — I480 Paroxysmal atrial fibrillation: Secondary | ICD-10-CM | POA: Diagnosis present

## 2020-02-01 DIAGNOSIS — N2581 Secondary hyperparathyroidism of renal origin: Secondary | ICD-10-CM | POA: Diagnosis not present

## 2020-02-01 DIAGNOSIS — I10 Essential (primary) hypertension: Secondary | ICD-10-CM | POA: Diagnosis not present

## 2020-02-01 DIAGNOSIS — I959 Hypotension, unspecified: Secondary | ICD-10-CM | POA: Diagnosis not present

## 2020-02-01 DIAGNOSIS — R0689 Other abnormalities of breathing: Secondary | ICD-10-CM | POA: Diagnosis not present

## 2020-02-01 LAB — COMPREHENSIVE METABOLIC PANEL
ALT: 9 U/L (ref 0–44)
AST: 13 U/L — ABNORMAL LOW (ref 15–41)
Albumin: 1.9 g/dL — ABNORMAL LOW (ref 3.5–5.0)
Alkaline Phosphatase: 100 U/L (ref 38–126)
Anion gap: 19 — ABNORMAL HIGH (ref 5–15)
BUN: 31 mg/dL — ABNORMAL HIGH (ref 6–20)
CO2: 25 mmol/L (ref 22–32)
Calcium: 9.1 mg/dL (ref 8.9–10.3)
Chloride: 95 mmol/L — ABNORMAL LOW (ref 98–111)
Creatinine, Ser: 6.67 mg/dL — ABNORMAL HIGH (ref 0.61–1.24)
GFR calc Af Amer: 10 mL/min — ABNORMAL LOW (ref >60)
GFR calc non Af Amer: 8 mL/min — ABNORMAL LOW (ref >60)
Glucose, Bld: 55 mg/dL — ABNORMAL LOW (ref 70–99)
Potassium: 3.3 mmol/L — ABNORMAL LOW (ref 3.5–5.1)
Sodium: 139 mmol/L (ref 135–145)
Total Bilirubin: 0.9 mg/dL (ref 0.3–1.2)
Total Protein: 5.4 g/dL — ABNORMAL LOW (ref 6.5–8.1)

## 2020-02-01 LAB — BLOOD GAS, ARTERIAL
Acid-Base Excess: 1.8 mmol/L (ref 0.0–2.0)
Bicarbonate: 25.4 mmol/L (ref 20.0–28.0)
FIO2: 80
O2 Saturation: 93.1 %
Patient temperature: 37
pCO2 arterial: 47.3 mmHg (ref 32.0–48.0)
pH, Arterial: 7.368 (ref 7.350–7.450)
pO2, Arterial: 77.5 mmHg — ABNORMAL LOW (ref 83.0–108.0)

## 2020-02-01 LAB — PROTIME-INR
INR: 1.4 — ABNORMAL HIGH (ref 0.8–1.2)
Prothrombin Time: 16.3 seconds — ABNORMAL HIGH (ref 11.4–15.2)

## 2020-02-01 LAB — CBC
HCT: 43.2 % (ref 39.0–52.0)
Hemoglobin: 13.8 g/dL (ref 13.0–17.0)
MCH: 28.3 pg (ref 26.0–34.0)
MCHC: 31.9 g/dL (ref 30.0–36.0)
MCV: 88.7 fL (ref 80.0–100.0)
Platelets: 341 10*3/uL (ref 150–400)
RBC: 4.87 MIL/uL (ref 4.22–5.81)
RDW: 15.6 % — ABNORMAL HIGH (ref 11.5–15.5)
WBC: 35.7 10*3/uL — ABNORMAL HIGH (ref 4.0–10.5)
nRBC: 0 % (ref 0.0–0.2)

## 2020-02-01 LAB — I-STAT CHEM 8, ED
BUN: 29 mg/dL — ABNORMAL HIGH (ref 6–20)
Calcium, Ion: 1.08 mmol/L — ABNORMAL LOW (ref 1.15–1.40)
Chloride: 98 mmol/L (ref 98–111)
Creatinine, Ser: 6.4 mg/dL — ABNORMAL HIGH (ref 0.61–1.24)
Glucose, Bld: 53 mg/dL — ABNORMAL LOW (ref 70–99)
HCT: 45 % (ref 39.0–52.0)
Hemoglobin: 15.3 g/dL (ref 13.0–17.0)
Potassium: 3.4 mmol/L — ABNORMAL LOW (ref 3.5–5.1)
Sodium: 139 mmol/L (ref 135–145)
TCO2: 27 mmol/L (ref 22–32)

## 2020-02-01 LAB — LACTIC ACID, PLASMA
Lactic Acid, Venous: 2 mmol/L (ref 0.5–1.9)
Lactic Acid, Venous: 1.6 mmol/L (ref 0.5–1.9)

## 2020-02-01 LAB — BRAIN NATRIURETIC PEPTIDE: B Natriuretic Peptide: 781 pg/mL — ABNORMAL HIGH (ref 0.0–100.0)

## 2020-02-01 LAB — TROPONIN I (HIGH SENSITIVITY)
Troponin I (High Sensitivity): 24 ng/L — ABNORMAL HIGH (ref <18)
Troponin I (High Sensitivity): 21 ng/L — ABNORMAL HIGH (ref <18)

## 2020-02-01 LAB — APTT: aPTT: 49 seconds — ABNORMAL HIGH (ref 24–36)

## 2020-02-01 LAB — MAGNESIUM: Magnesium: 2.3 mg/dL (ref 1.7–2.4)

## 2020-02-01 LAB — PHOSPHORUS: Phosphorus: 8.1 mg/dL — ABNORMAL HIGH (ref 2.5–4.6)

## 2020-02-01 LAB — SARS CORONAVIRUS 2 BY RT PCR (HOSPITAL ORDER, PERFORMED IN ~~LOC~~ HOSPITAL LAB): SARS Coronavirus 2: NEGATIVE

## 2020-02-01 MED ORDER — NICOTINE 21 MG/24HR TD PT24
21.0000 mg | MEDICATED_PATCH | Freq: Every day | TRANSDERMAL | Status: DC
  Administered 2020-02-01 – 2020-02-02 (×2): 21 mg via TRANSDERMAL
  Filled 2020-02-01 (×2): qty 1

## 2020-02-01 MED ORDER — HYDROMORPHONE HCL 1 MG/ML IJ SOLN
1.0000 mg | Freq: Once | INTRAMUSCULAR | Status: AC
  Administered 2020-02-01: 1 mg via INTRAVENOUS
  Filled 2020-02-01: qty 1

## 2020-02-01 MED ORDER — SODIUM CHLORIDE 0.9 % IV BOLUS (SEPSIS)
1000.0000 mL | Freq: Once | INTRAVENOUS | Status: AC
  Administered 2020-02-01: 1000 mL via INTRAVENOUS

## 2020-02-01 MED ORDER — SODIUM CHLORIDE 0.9 % IV SOLN
3.0000 g | Freq: Once | INTRAVENOUS | Status: AC
  Administered 2020-02-01: 3 g via INTRAVENOUS
  Filled 2020-02-01: qty 8

## 2020-02-01 MED ORDER — ACETAMINOPHEN 650 MG RE SUPP: 650.0000 mg | Freq: Four times a day (QID) | RECTAL | Status: DC | PRN

## 2020-02-01 MED ORDER — PREDNISONE 20 MG PO TABS
40.0000 mg | ORAL_TABLET | Freq: Every day | ORAL | Status: DC
  Administered 2020-02-01: 40 mg via ORAL
  Filled 2020-02-01: qty 2

## 2020-02-01 MED ORDER — VANCOMYCIN HCL IN DEXTROSE 1-5 GM/200ML-% IV SOLN: 1000.0000 mg | Freq: Once | INTRAVENOUS | Status: DC

## 2020-02-01 MED ORDER — SODIUM CHLORIDE 0.9 % IV SOLN
500.0000 mg | INTRAVENOUS | Status: DC
  Filled 2020-02-01: qty 0.5

## 2020-02-01 MED ORDER — OXYCODONE HCL 5 MG PO TABS
5.0000 mg | ORAL_TABLET | Freq: Three times a day (TID) | ORAL | Status: DC | PRN
  Administered 2020-02-01 – 2020-02-02 (×2): 5 mg via ORAL
  Filled 2020-02-01 (×3): qty 1

## 2020-02-01 MED ORDER — SODIUM CHLORIDE 0.9 % IV SOLN: INTRAVENOUS | Status: DC

## 2020-02-01 MED ORDER — METHOCARBAMOL 500 MG PO TABS: 500.0000 mg | ORAL_TABLET | Freq: Three times a day (TID) | ORAL | Status: DC | PRN

## 2020-02-01 MED ORDER — ACETAMINOPHEN 325 MG PO TABS
650.0000 mg | ORAL_TABLET | Freq: Four times a day (QID) | ORAL | Status: DC | PRN
  Administered 2020-02-01: 650 mg via ORAL
  Filled 2020-02-01: qty 2

## 2020-02-01 MED ORDER — SODIUM CHLORIDE 0.9 % IV SOLN
1.0000 g | Freq: Once | INTRAVENOUS | Status: AC
  Administered 2020-02-01: 1 g via INTRAVENOUS
  Filled 2020-02-01: qty 1

## 2020-02-01 MED ORDER — ONDANSETRON HCL 4 MG/2ML IJ SOLN
4.0000 mg | Freq: Four times a day (QID) | INTRAMUSCULAR | Status: DC | PRN
  Administered 2020-02-01: 4 mg via INTRAVENOUS
  Filled 2020-02-01: qty 2

## 2020-02-01 MED ORDER — CHLORHEXIDINE GLUCONATE CLOTH 2 % EX PADS: 6.0000 | MEDICATED_PAD | Freq: Every day | CUTANEOUS | Status: DC

## 2020-02-01 MED ORDER — HEPARIN SODIUM (PORCINE) 5000 UNIT/ML IJ SOLN
5000.0000 [IU] | Freq: Three times a day (TID) | INTRAMUSCULAR | Status: DC
  Administered 2020-02-01: 5000 [IU] via SUBCUTANEOUS
  Filled 2020-02-01: qty 1

## 2020-02-01 MED ORDER — BUDESONIDE 0.5 MG/2ML IN SUSP
0.5000 mg | Freq: Two times a day (BID) | RESPIRATORY_TRACT | Status: DC
  Administered 2020-02-01 – 2020-02-02 (×2): 0.5 mg via RESPIRATORY_TRACT
  Filled 2020-02-01 (×2): qty 2

## 2020-02-01 MED ORDER — SODIUM CHLORIDE 0.9 % IV BOLUS
1000.0000 mL | Freq: Once | INTRAVENOUS | Status: AC
  Administered 2020-02-01: 1000 mL via INTRAVENOUS

## 2020-02-01 MED ORDER — ONDANSETRON HCL 4 MG PO TABS: 4.0000 mg | ORAL_TABLET | Freq: Four times a day (QID) | ORAL | Status: DC | PRN

## 2020-02-01 MED ORDER — VANCOMYCIN HCL 750 MG/150ML IV SOLN
750.0000 mg | INTRAVENOUS | Status: DC
  Filled 2020-02-01: qty 150

## 2020-02-01 MED ORDER — IPRATROPIUM-ALBUTEROL 0.5-2.5 (3) MG/3ML IN SOLN: 3.0000 mL | Freq: Four times a day (QID) | RESPIRATORY_TRACT | Status: DC | PRN

## 2020-02-01 MED ORDER — SODIUM CHLORIDE 0.9 % IV BOLUS
250.0000 mL | Freq: Once | INTRAVENOUS | Status: AC
  Administered 2020-02-01: 250 mL via INTRAVENOUS

## 2020-02-01 MED ORDER — TRAZODONE HCL 50 MG PO TABS: 50.0000 mg | ORAL_TABLET | Freq: Every evening | ORAL | Status: DC | PRN

## 2020-02-01 MED ORDER — VANCOMYCIN HCL 1250 MG/250ML IV SOLN
1250.0000 mg | Freq: Once | INTRAVENOUS | Status: AC
  Administered 2020-02-01: 1250 mg via INTRAVENOUS
  Filled 2020-02-01: qty 250

## 2020-02-01 NOTE — Consult Note (Signed)
ESRD Consult Note [facility]  Requesting provider: Barton Dubois, MD Reason for consult: ESRD, provision of dialysis  Outpatient dialysis unit: [facility] Outpatient dialysis schedule: TTS  Assessment/Recommendations: Steven Sellers is a/an 59 y.o. male with a past medical history notable for ESRD on HD admitted with respiratory distress secondary to pneumonia.   # ESRD:  -outpatient orders (discussed with his outpatient unit): 3hrs 37min, BFR/DFR: 400/800. 1K, 2.5Cal. On linear Na profile with UF profile (mainly for cramping). Heparin: 500 unit bolus followed by 1000units/hr. -HD today-in the process of being started, will maintain TTS schedule while admitted  # Volume: EDW 62kg, UF as tolerated given borderline BP  # Relative hypotension: hold anti-HTNs for now. Resume as needed  # Anemia of Chronic Kidney Disease: Hemoglobin 15.3->15.3, likely hemoconcentrated prior. Receives Epo 1500units qtreatment. Receives [place] 150 [place]. Holding ESA and iron given hgb above goal.  # Secondary Hyperparathyroidism/Hyperphosphatemia: trend phos, restrict phos in diet, if no improvement with HD then will need binders  # Vascular access: LUE avf with chronic swelling in dependent arm, no issues with sticking access on prev admission and as an outpatient per HD unit  # Hypoalbuminemia: renal diet, consider discussion with dietician to help increase protein in diet  # Respiratory distress: possible HCAP on meropenem, vanc (dose with HD if to be continued). COVID neg.  # Sepsis 2/2 HCAP, leukocytosis: antibiotics per primary service. BP improved  # Hypogylcemia: on dextrose infusion, mgmt per primary service. Caution with continuous IVFs  # Additional recommendations: - Dose all meds for creatinine clearance < 10 ml/min  - Unless absolutely necessary, no MRIs with gadolinium.  - Implement save arm precautions.  Prefer needle sticks in the dorsum of the hands or  wrists.  No blood pressure measurements in arm. - If blood transfusion is requested during hemodialysis sessions, please alert [place] prior to the session.   Recommendations were discussed with the primary team.  Gean Quint, MD [facility]  History of Present Illness: Steven Sellers is a 59 y.o. male with a past medical history of ESRD on IHD, limb girdle muscular dystrophy, h/o PE, seizure disorder, afib, recent admission for AHRF/arrhythmia who presents to [facility] with respiratory distress. Hypoglycemic overnight requiring dextrose infusion. Last HD 9/21 via AVF. No issues with HD per dialysis unit. Relatively lethargic but arousable. States breathing is about the same. Denies chest pain, n/v/d currently.   Medications:  Current Facility-Administered Medications  Medication Dose Route Frequency Provider Last Rate Last Admin  . acetaminophen (TYLENOL) tablet 650 mg  650 mg Oral Q6H PRN Barton Dubois, MD   650 mg at 01/14/2020 1109   Or  . acetaminophen (TYLENOL) suppository 650 mg  650 mg Rectal Q6H PRN Barton Dubois, MD      . heparin injection 5,000 Units  5,000 Units Subcutaneous Q8H Barton Dubois, MD      . meropenem (MERREM) 1 g in sodium chloride 0.9 % 100 mL IVPB  1 g Intravenous Once Barton Dubois, MD      . methocarbamol (ROBAXIN) tablet 500 mg  500 mg Oral Q8H PRN Barton Dubois, MD      . nicotine (NICODERM CQ - dosed in mg/24 hours) patch 21 mg  21 mg Transdermal Daily Barton Dubois, MD      . ondansetron [facility]) tablet 4 mg  4 mg Oral Q6H PRN Barton Dubois, MD       Or  . ondansetron [facility]) injection 4 mg  4 mg Intravenous  Q6H PRN Barton Dubois, MD   4 mg at 01/12/2020 1234  . oxyCODONE (Oxy IR/ROXICODONE) immediate release tablet 5 mg  5 mg Oral Q8H PRN Barton Dubois, MD      . predniSONE (DELTASONE) tablet 40 mg  40 mg Oral Daily Barton Dubois, MD   40 mg at 01/20/2020 1109  . sodium chloride 0.9 % bolus 1,000 mL  1,000 mL Intravenous Once Barton Dubois, MD  250 mL/hr at 02/06/2020 1147 1,000 mL at 02/08/2020 1147  . sodium chloride 0.9 % bolus 250 mL  250 mL Intravenous Once Barton Dubois, MD      . vancomycin (VANCOREADY) IVPB 1250 mg/250 mL  1,250 mg Intravenous Once Barton Dubois, MD 166.7 mL/hr at 01/29/2020 1146 1,250 mg at 01/20/2020 1146   Current Outpatient Medications  Medication Sig Dispense Refill  . megestrol (MEGACE) 40 MG/ML suspension Take 400 mg by mouth daily.    Marland Kitchen oxyCODONE (OXY IR/ROXICODONE) 5 MG immediate release tablet Take 1 tablet (5 mg total) by mouth every 6 (six) hours as needed for moderate pain or severe pain. 30 tablet 0  . VENTOLIN HFA 108 (90 Base) MCG/ACT inhaler Inhale 1-2 puffs into the lungs daily as needed for shortness of breath or wheezing.    . carvedilol (COREG) 3.125 MG tablet Take 1 tablet (3.125 mg total) by mouth 2 (two) times daily with a meal. 60 tablet 2  . Cholecalciferol (VITAMIN D3) 50 MCG (2000 UT) capsule Take 1 capsule (2,000 Units total) by mouth daily. (Patient not taking: Reported on 10/27/2019) 90 capsule 0  . Darbepoetin Alfa (ARANESP) 100 MCG/0.5ML SOSY injection Inject 0.5 mLs (100 mcg total) into the vein every Friday with hemodialysis. (Patient not taking: Reported on 10/27/2019) 4.2 mL   . methocarbamol (ROBAXIN) 500 MG tablet Take 1 tablet (500 mg total) by mouth every 6 (six) hours as needed for muscle spasms. (Patient not taking: Reported on 02/07/2020) 28 tablet 0  . nicotine (NICODERM CQ - DOSED IN MG/24 HOURS) 21 mg/24hr patch Place 1 patch (21 mg total) onto the skin daily. (Patient not taking: Reported on 01/16/2020) 28 patch 0  . polyethylene glycol (MIRALAX / GLYCOLAX) 17 g packet Take 17 g by mouth daily as needed for mild constipation. (Patient not taking: Reported on 10/27/2019) 14 each 0  . traZODone (DESYREL) 50 MG tablet Take 1 tablet (50 mg total) by mouth at bedtime as needed for sleep. (Patient not taking: Reported on 10/27/2019) 30 tablet 0  . Vitamin D, Ergocalciferol, (DRISDOL)  1.25 MG (50000 UNIT) CAPS capsule Take 1 capsule (50,000 Units total) by mouth every 7 (seven) days. (Patient not taking: Reported on 10/27/2019) 8 capsule 0     ALLERGIES Diltiazem and Other  MEDICAL HISTORY Past Medical History:  Diagnosis Date  . Alcohol use   . Anemia of chronic disease   . Chest pain with normal coronary angiography 2002  . ESRD (end stage renal disease) ([place])    Hemodialysis [facility]- [facility] in [place]  . [place]   . Hypertension   . Limb-girdle muscular dystrophy ([place])   . PAF (paroxysmal atrial fibrillation) ([place])    diagnosed 2018  . Pulmonary embolism ([place]) 2011  . Seizures ([place]) 09/2016  . Tobacco abuse   . Vision abnormalities   . Vitamin D deficiency      SOCIAL HISTORY Social History   Socioeconomic History  . Marital status: Married    Spouse name: Not on file  . Number of children: Not on file  .  Years of education: Not on file  . Highest education level: Not on file  Occupational History  . Not on file  Tobacco Use  . Smoking status: Current Every Day Smoker    Packs/day: 0.50    Years: 40.00    Pack years: 20.00    Types: Cigarettes    Start date: 05/12/1974  . Smokeless tobacco: Never Used  . Tobacco comment: 1/2 pk per day  Vaping Use  . Vaping Use: Never used  Substance and Sexual Activity  . Alcohol use: No    Alcohol/week: 24.0 standard drinks    Types: 24 Standard drinks or equivalent per week    Comment: occ  . Drug use: No  . Sexual activity: Not on file  Other Topics Concern  . Not on file  Social History Narrative  . Not on file   Social Determinants of Health   Financial Resource Strain:   . Difficulty of Paying Living Expenses: Not on file  Food Insecurity:   . Worried About Charity fundraiser in the Last Year: Not on file  . Ran Out of Food in the Last Year: Not on file  Transportation Needs:   . Lack of Transportation (Medical): Not on file  . Lack of Transportation (Non-Medical): Not on file  Physical  Activity:   . Days of Exercise per Week: Not on file  . Minutes of Exercise per Session: Not on file  Stress:   . Feeling of Stress : Not on file  Social Connections:   . Frequency of Communication with Friends and Family: Not on file  . Frequency of Social Gatherings with Friends and Family: Not on file  . Attends Religious Services: Not on file  . Active Member of Clubs or Organizations: Not on file  . Attends Archivist Meetings: Not on file  . Marital Status: Not on file  Intimate Partner Violence:   . Fear of Current or Ex-Partner: Not on file  . Emotionally Abused: Not on file  . Physically Abused: Not on file  . Sexually Abused: Not on file     FAMILY HISTORY Family History  Problem Relation Age of Onset  . Hypertension Mother   . Heart attack Father        d/o MI at 71 yo   . Cancer Neg Hx   . Kidney disease Neg Hx      Review of Systems: 12 systems were reviewed and negative except per HPI  Physical Exam: Vitals:   01/31/2020 1200 02/09/2020 1230  BP: 105/79 116/76  Pulse: (!) 113 (!) 110  Resp: 18 16  SpO2: 94% 100%   No intake/output data recorded. No intake or output data in the 24 hours ending 02/07/2020 1253 General: chronically ill appearing, lethargic HEENT: anicteric sclera, MMM CV: tachycardic, no murmurs/rubs/gallops Lungs: diminished breath sounds bibasilar, bilateral chest expansion, on supplemental O2 Abd: soft, non-tender, non-distended Skin: no visible lesions or rashes Msk: LUE AVF +b/t, +LUE dependent edema Neuro: lethargic but arousable and conversant when awake, speech clear and coherent, follows commands  Test Results Reviewed Lab Results  Component Value Date   NA 139 01/19/2020   K 3.4 (L) 01/31/2020   CL 98 01/14/2020   CO2 25 01/25/2020   BUN 29 (H) 01/24/2020   CREATININE 6.40 (H) 01/13/2020   CALCIUM 9.1 02/09/2020   ALBUMIN 1.9 (L) 01/11/2020   PHOS 8.1 (H) 01/14/2020    I have reviewed relevant outside  healthcare records

## 2020-02-01 NOTE — ED Provider Notes (Signed)
[facility] Emergency Department Provider Note MRN:  [id number]  Arrival date & time: 01/11/2020     Chief Complaint   Respiratory Distress   History of Present Illness   Steven Sellers is a 59 y.o. year-old male with a history of ESRD, MS, PE presenting to the ED with chief complaint of respiratory distress.  Mild shortness of breath yesterday evening that became much worse throughout the night and early morning today.  EMS arrived noting respiratory distress.  Patient denies chest pain.  Increased cough recently.  Review of Systems  A complete 10 system review of systems was obtained and all systems are negative except as noted in the HPI and PMH.   Patient's Health History    Past Medical History:  Diagnosis Date  . Alcohol use   . Anemia of chronic disease   . Chest pain with normal coronary angiography 2002  . ESRD (end stage renal disease) ([place])    Hemodialysis [facility]- [facility] in [place]  . Falls   . Hypertension   . 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 PAF (paroxysmal atrial fibrillation) ([place])    diagnosed 2018  . Pulmonary embolism ([place]) 2011  . Seizures ([place]) 09/2016  . Tobacco abuse   . Vision abnormalities   . Vitamin D deficiency     Past Surgical History:  Procedure Laterality Date  . A/V FISTULAGRAM Left 11/05/2016   Procedure: A/V Fistulagram;  Surgeon: Waynetta Sandy, MD;  Location: [facility];  Service: Cardiovascular;  Laterality: Left;  . A/V FISTULAGRAM Left 01/18/2018   Procedure: A/V FISTULAGRAM - left upper extremity;  Surgeon: Waynetta Sandy, MD;  Location: [facility];  Service: Cardiovascular;  Laterality: Left;  . AV FISTULA PLACEMENT Left 12/26/2015   Procedure: CREATION OF LEFT RADIAL-CEPHALIC ARTERIOVENOUS FISTULA FOR HEMODIALYSIS;  Surgeon: Vickie Epley, MD;  Location: [facility];  Service: Vascular;  Laterality: Left;  . AV FISTULA PLACEMENT Left 04/28/2016   Procedure:  REVISION OF LEFT RADIAL CEPHALIC ARTERIOVENOUS (AV) FISTULA  FOR DURABLE HEMODIALYSIS;  Surgeon: Vickie Epley, MD;  Location: [facility];  Service: Vascular;  Laterality: Left;  . CARDIAC SURGERY    . CENTRAL VENOUS CATHETER INSERTION Right 12/04/2015   Procedure: INSERTION OF TUNNELED HEMODIALYSIS CATHETER RIGHT INTERNAL JUGULAR;  Surgeon: Vickie Epley, MD;  Location: [facility];  Service: General;  Laterality: Right;  . IR REMOVAL TUN CV CATH W/O FL  12/10/2016  . ORIF FEMUR FRACTURE Right 10/03/2019   Procedure: OPEN REDUCTION INTERNAL FIXATION (ORIF) DISTAL FEMUR FRACTURE;  Surgeon: Shona Needles, MD;  Location: [facility];  Service: Orthopedics;  Laterality: Right;  . PERIPHERAL VASCULAR BALLOON ANGIOPLASTY Left 01/18/2018   Procedure: PERIPHERAL VASCULAR BALLOON ANGIOPLASTY;  Surgeon: Waynetta Sandy, MD;  Location: [facility];  Service: Cardiovascular;  Laterality: Left;  upper arm fistula  . REVISON OF ARTERIOVENOUS FISTULA Left 07/07/2016   Procedure: CREATION OF LEFT ARM BRACHIOCEPHALIC FISTULA;  Surgeon: Waynetta Sandy, MD;  Location: [facility];  Service: Vascular;  Laterality: Left;    Family History  Problem Relation Age of Onset  . Hypertension Mother   . Heart attack Father        d/o MI at 84 yo   . Cancer Neg Hx   . Kidney disease Neg Hx     Social History   Socioeconomic History  . Marital status: Married    Spouse name: Not on file  . Number of  children: Not on file  . Years of education: Not on file  . Highest education level: Not on file  Occupational History  . Not on file  Tobacco Use  . Smoking status: Current Every Day Smoker    Packs/day: 0.50    Years: 40.00    Pack years: 20.00    Types: Cigarettes    Start date: 05/12/1974  . Smokeless tobacco: Never Used  . Tobacco comment: 1/2 pk per day  Vaping Use  . Vaping Use: Never used  Substance and Sexual Activity  . Alcohol use: No    Alcohol/week: 24.0 standard drinks    Types: 24  Standard drinks or equivalent per week    Comment: occ  . Drug use: No  . Sexual activity: Not on file  Other Topics Concern  . Not on file  Social History Narrative  . Not on file   Social Determinants of Health   Financial Resource Strain:   . Difficulty of Paying Living Expenses: Not on file  Food Insecurity:   . Worried About Charity fundraiser in the Last Year: Not on file  . Ran Out of Food in the Last Year: Not on file  Transportation Needs:   . Lack of Transportation (Medical): Not on file  . Lack of Transportation (Non-Medical): Not on file  Physical Activity:   . Days of Exercise per Week: Not on file  . Minutes of Exercise per Session: Not on file  Stress:   . Feeling of Stress : Not on file  Social Connections:   . Frequency of Communication with Friends and Family: Not on file  . Frequency of Social Gatherings with Friends and Family: Not on file  . Attends Religious Services: Not on file  . Active Member of Clubs or Organizations: Not on file  . Attends Archivist Meetings: Not on file  . Marital Status: Not on file  Intimate Partner Violence:   . Fear of Current or Ex-Partner: Not on file  . Emotionally Abused: Not on file  . Physically Abused: Not on file  . Sexually Abused: Not on file     Physical Exam   Vitals:   02/06/2020 1304 01/18/2020 1430  BP: 94/64 131/79  Pulse: (!) 114 (!) 108  Resp: 17 17  SpO2: 99% 91%    CONSTITUTIONAL: Chronically ill-appearing, cachectic, mild respiratory distress  NEURO:  Alert and oriented x 3, no focal deficits EYES:  eyes equal and reactive ENT/NECK:  no LAD, no JVD CARDIO: Tachycardic rate, well-perfused, normal S1 and S2 PULM: Scattered rhonchi and decreased breath sounds on the left GI/GU:  normal bowel sounds, non-distended, non-tender MSK/SPINE:  No gross deformities, chronic edema to the left upper extremity SKIN:  no rash, atraumatic PSYCH:  Appropriate speech and behavior  *Additional and/or  pertinent findings included in MDM below  Diagnostic and Interventional Summary    EKG Interpretation  Date/Time:  Wednesday February 01 2020 08:05:03 EDT Ventricular Rate:  122 PR Interval:    QRS Duration: 94 QT Interval:  338 QTC Calculation: 484 R Axis:   4 Text Interpretation: Sinus tachycardia Ventricular premature complex Aberrant conduction of SV complex(es) LAE, consider biatrial enlargement RSR' in V1 or V2, probably normal variant Borderline prolonged QT interval Confirmed by Gerlene Fee [phone]) on 01/21/2020 8:39:45 AM      Labs Reviewed  BLOOD GAS, ARTERIAL - Abnormal; Notable for the following components:      Result Value   pO2, Arterial 77.5 (*)  All other components within normal limits  CBC - Abnormal; Notable for the following components:   WBC 35.7 (*)    RDW 15.6 (*)    All other components within normal limits  COMPREHENSIVE METABOLIC PANEL - Abnormal; Notable for the following components:   Potassium 3.3 (*)    Chloride 95 (*)    Glucose, Bld 55 (*)    BUN 31 (*)    Creatinine, Ser 6.67 (*)    Total Protein 5.4 (*)    Albumin 1.9 (*)    AST 13 (*)    GFR calc non Af Amer 8 (*)    GFR calc Af Amer 10 (*)    Anion gap 19 (*)    All other components within normal limits  BRAIN NATRIURETIC PEPTIDE - Abnormal; Notable for the following components:   B Natriuretic Peptide 781.0 (*)    All other components within normal limits  PROTIME-INR - Abnormal; Notable for the following components:   Prothrombin Time 16.3 (*)    INR 1.4 (*)    All other components within normal limits  APTT - Abnormal; Notable for the following components:   aPTT 49 (*)    All other components within normal limits  LACTIC ACID, PLASMA - Abnormal; Notable for the following components:   Lactic Acid, Venous 2.0 (*)    All other components within normal limits  PHOSPHORUS - Abnormal; Notable for the following components:   Phosphorus 8.1 (*)    All other components within  normal limits  I-STAT CHEM 8, ED - Abnormal; Notable for the following components:   Potassium 3.4 (*)    BUN 29 (*)    Creatinine, Ser 6.40 (*)    Glucose, Bld 53 (*)    Calcium, Ion 1.08 (*)    All other components within normal limits  TROPONIN I (HIGH SENSITIVITY) - Abnormal; Notable for the following components:   Troponin I (High Sensitivity) 24 (*)    All other components within normal limits  TROPONIN I (HIGH SENSITIVITY) - Abnormal; Notable for the following components:   Troponin I (High Sensitivity) 21 (*)    All other components within normal limits  CULTURE, BLOOD (SINGLE)  SARS CORONAVIRUS 2 BY RT PCR (HOSPITAL ORDER, [place] [facility])  EXPECTORATED SPUTUM ASSESSMENT W REFEX TO RESP CULTURE  URINE CULTURE  MAGNESIUM  LACTIC ACID, PLASMA    DG Abd Portable 2 Views  Final Result    DG Chest Port 1 View  Final Result      Medications  predniSONE (DELTASONE) tablet 40 mg (40 mg Oral Given 01/30/2020 1109)  heparin injection 5,000 Units (5,000 Units Subcutaneous Given 01/29/2020 1445)  acetaminophen (TYLENOL) tablet 650 mg (650 mg Oral Given 01/12/2020 1109)    Or  acetaminophen (TYLENOL) suppository 650 mg ( Rectal See Alternative 01/22/2020 1109)  ondansetron (ZOFRAN) tablet 4 mg ( Oral See Alternative 01/16/2020 1234)    Or  ondansetron (ZOFRAN) injection 4 mg (4 mg Intravenous Given 02/09/2020 1234)  sodium chloride 0.9 % bolus 1,000 mL (1,000 mLs Intravenous New Bag/Given 01/27/2020 1147)  oxyCODONE (Oxy IR/ROXICODONE) immediate release tablet 5 mg (5 mg Oral Given 01/31/2020 1445)  nicotine (NICODERM CQ - dosed in mg/24 hours) patch 21 mg (21 mg Transdermal Patch Applied 01/25/2020 1446)  methocarbamol (ROBAXIN) tablet 500 mg (has no administration in time range)  Chlorhexidine Gluconate Cloth 2 % PADS 6 each (has no administration in time range)  meropenem (MERREM) 500 mg in sodium  chloride 0.9 % 100 mL IVPB (has no administration in time range)  vancomycin  (VANCOREADY) IVPB 750 mg/150 mL (has no administration in time range)  Ampicillin-Sulbactam (UNASYN) 3 g in sodium chloride 0.9 % 100 mL IVPB (0 g Intravenous Stopped 01/27/2020 0918)  sodium chloride 0.9 % bolus 1,000 mL (0 mLs Intravenous Stopped 01/27/2020 1109)  meropenem (MERREM) 1 g in sodium chloride 0.9 % 100 mL IVPB (0 g Intravenous Stopped 01/17/2020 1433)  vancomycin (VANCOREADY) IVPB 1250 mg/250 mL (0 mg Intravenous Stopped 02/08/2020 1340)  HYDROmorphone (DILAUDID) injection 1 mg (1 mg Intravenous Given 01/16/2020 1142)  sodium chloride 0.9 % bolus 250 mL (0 mLs Intravenous Stopped 01/16/2020 1340)     Procedures  /  Critical Care .Critical Care Performed by: Maudie Flakes, MD Authorized by: Maudie Flakes, MD   Critical care provider statement:    Critical care time (minutes):  45   Critical care was necessary to treat or prevent imminent or life-threatening deterioration of the following conditions:  Respiratory failure and sepsis   Critical care was time spent personally by me on the following activities:  Discussions with consultants, evaluation of patient's response to treatment, examination of patient, ordering and performing treatments and interventions, ordering and review of laboratory studies, ordering and review of radiographic studies, pulse oximetry, re-evaluation of patient's condition, obtaining history from patient or surrogate and review of old charts Ultrasound ED Peripheral IV (Provider)  Date/Time: 02/07/2020 8:42 AM Performed by: Maudie Flakes, MD Authorized by: Maudie Flakes, MD   Procedure details:    Indications: poor IV access     Skin Prep: chlorhexidine gluconate     Location: Right upper arm.   Angiocath:  20 G   Bedside Ultrasound Guided: Yes     Patient tolerated procedure without complications: Yes     Dressing applied: Yes      ED Course and Medical Decision Making  I have reviewed the triage vital signs, the nursing notes, and pertinent available  records from the EMR.  Listed above are laboratory and imaging tests that I personally ordered, reviewed, and interpreted and then considered in my medical decision making (see below for details).  Respiratory distress, hypoxic respiratory failure, tachypneic, tachycardic, difficult to obtain accurate SPO2 measurements peripherally, awaiting blood gas.  Patient is without cyanosis and is conversant and protecting airway.  Chest x-ray with concerning pneumonia, possibly parapneumonic effusion as well.  Starting empiric antibiotics.     Admitted to hospitalist service for further care, stepdown unit.  Barth Kirks. [place]onia Small, [place] [email or web address]  Final Clinical Impressions(s) / ED Diagnoses     ICD-10-CM   1. Sepsis, due to unspecified organism, unspecified whether acute organ dysfunction present ([place])  A41.9   2. Aspiration pneumonia, unspecified aspiration pneumonia type, unspecified laterality, unspecified part of lung ([place])  J69.0     ED Discharge Orders    None       Discharge Instructions Discussed with and Provided to Patient:   Discharge Instructions   None       Maudie Flakes, MD 01/30/2020 [phone]

## 2020-02-01 NOTE — H&P (Signed)
History and Physical    Steven Sellers KMM:[id number] DOB: Nov 04, 1960 DOA: 01/21/2020  PCP: Sharilyn Sites, MD   Patient coming from: Home  I have personally briefly reviewed patient's old medical records in [place]  Chief Complaint: Shortness of breath and productive cough.  HPI: Steven Sellers is a 59 y.o. male with medical history significant of with past medical history significant for end-stage renal disease, hypertension, tobacco abuse prior history of alcohol abuse/seizures, insomnia chronic pain syndrome; who presented to the hospital secondary to shortness of breath, productive cough not feeling well.  Symptoms have been present for the last 2 days and worsening.  He reports chills but no frank fevers; expressed having dialysis as usual on 01/31/2020.  No sick contacts reported.  Patient expressed one episode of nausea/vomiting and one episode of diarrhea the night prior to admission.  Per records at the time EMS arrived patient O2 sats were in the 77% range on room air.  He denies abdominal pain, headaches, vision disturbances, focal weakness, dysuria, hematuria, melena, hematochezia, chest pain, hemoptysis or any other complaints.  Covid test by PCR negative.  ED Course: Chest x-ray obtained able to demonstrate left lower lobe infiltrates compatible with pneumonia patient requiring oxygen supplementation to maintain O2 sat above 89%; elevated WBCs, positive tachypnea, and positive tachycardia; lactic acid 2.0.  Patient met sepsis criteria on presentation.  Cultures taken, antibiotics initiated after resuscitation started.  TRH called to admit patient for further evaluation and management.  Review of Systems: As per HPI otherwise all other systems reviewed and are negative.   Past Medical History:  Diagnosis Date  . Alcohol use   . Anemia of chronic disease   . Chest pain with normal coronary angiography 2002  . ESRD (end stage renal disease) ([place])    Hemodialysis [facility]THS-  [facility] in [place]  . [place]   . Hypertension   . 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 PAF (paroxysmal atrial fibrillation) ([place])    diagnosed 2018  . Pulmonary embolism ([place]) 2011  . Seizures ([place]) 09/2016  . Tobacco abuse   . Vision abnormalities   . Vitamin D deficiency     Past Surgical History:  Procedure Laterality Date  . A/V FISTULAGRAM Left 11/05/2016   Procedure: A/V Fistulagram;  Surgeon: Waynetta Sandy, MD;  Location: [facility];  Service: Cardiovascular;  Laterality: Left;  . A/V FISTULAGRAM Left 01/18/2018   Procedure: A/V FISTULAGRAM - left upper extremity;  Surgeon: Waynetta Sandy, MD;  Location: [facility];  Service: Cardiovascular;  Laterality: Left;  . AV FISTULA PLACEMENT Left 12/26/2015   Procedure: CREATION OF LEFT RADIAL-CEPHALIC ARTERIOVENOUS FISTULA FOR HEMODIALYSIS;  Surgeon: Vickie Epley, MD;  Location: [facility];  Service: Vascular;  Laterality: Left;  . AV FISTULA PLACEMENT Left 04/28/2016   Procedure: REVISION OF LEFT RADIAL CEPHALIC ARTERIOVENOUS (AV) FISTULA  FOR DURABLE HEMODIALYSIS;  Surgeon: Vickie Epley, MD;  Location: [facility];  Service: Vascular;  Laterality: Left;  . CARDIAC SURGERY    . CENTRAL VENOUS CATHETER INSERTION Right 12/04/2015   Procedure: INSERTION OF TUNNELED HEMODIALYSIS CATHETER RIGHT INTERNAL JUGULAR;  Surgeon: Vickie Epley, MD;  Location: [facility];  Service: General;  Laterality: Right;  . IR REMOVAL TUN CV CATH W/O FL  12/10/2016  . ORIF FEMUR FRACTURE Right 10/03/2019   Procedure: OPEN REDUCTION INTERNAL FIXATION (ORIF) DISTAL FEMUR FRACTURE;  Surgeon: Shona Needles, MD;  Location: [facility];  Service: Orthopedics;  Laterality:  Right;  Marland Kitchen PERIPHERAL VASCULAR BALLOON ANGIOPLASTY Left 01/18/2018   Procedure: PERIPHERAL VASCULAR BALLOON ANGIOPLASTY;  Surgeon: Waynetta Sandy, MD;  Location: [facility];  Service: Cardiovascular;  Laterality: Left;  upper arm fistula  . REVISON OF  ARTERIOVENOUS FISTULA Left 07/07/2016   Procedure: CREATION OF LEFT ARM BRACHIOCEPHALIC FISTULA;  Surgeon: Waynetta Sandy, MD;  Location: [facility];  Service: Vascular;  Laterality: Left;    Social History  reports that he has been smoking cigarettes. He started smoking about 45 years ago. He has a 20.00 pack-year smoking history. He has never used smokeless tobacco. He reports that he does not drink alcohol and does not use drugs.  Allergies  Allergen Reactions  . Diltiazem     Patient recalls feeling horribly sick, presyncopal after taking  . Other Other (See Comments)    IV contrast- Renal issues    Family History  Problem Relation Age of Onset  . Hypertension Mother   . Heart attack Father        d/o MI at 46 yo   . Cancer Neg Hx   . Kidney disease Neg Hx     Prior to Admission medications   Medication Sig Start Date End Date Taking? Authorizing Provider  megestrol (MEGACE) 40 MG/ML suspension Take 400 mg by mouth daily. 01/28/20  Yes [provider]  oxyCODONE (OXY IR/ROXICODONE) 5 MG immediate release tablet Take 1 tablet (5 mg total) by mouth every 6 (six) hours as needed for moderate pain or severe pain. 10/07/19  Yes [place] Donnamarie Poag, DO  VENTOLIN HFA 108 [phone] [place]) MCG/ACT inhaler Inhale 1-2 puffs into the lungs daily as needed for shortness of breath or wheezing. 01/04/20  Yes [provider]  carvedilol (COREG) 3.125 MG tablet Take 1 tablet (3.125 mg total) by mouth 2 (two) times daily with a meal. 11/03/19 12/03/19  Pokhrel, Corrie Mckusick, MD  Cholecalciferol (VITAMIN D3) 50 MCG (2000 UT) capsule Take 1 capsule (2,000 Units total) by mouth daily. Patient not taking: Reported on 10/27/2019 10/07/19   [place] Donnamarie Poag, DO  Darbepoetin Alfa (ARANESP) 100 MCG/0.5ML SOSY injection Inject 0.5 mLs (100 mcg total) into the vein every Friday with hemodialysis. Patient not taking: Reported on 10/27/2019 10/07/19   [place] Donnamarie Poag, DO  methocarbamol (ROBAXIN) 500 MG tablet  Take 1 tablet (500 mg total) by mouth every 6 (six) hours as needed for muscle spasms. Patient not taking: Reported on 02/04/2020 10/05/19   Delray Alt, PA-C  nicotine (NICODERM CQ - DOSED IN MG/24 HOURS) 21 mg/24hr patch Place 1 patch (21 mg total) onto the skin daily. Patient not taking: Reported on 01/26/2020 11/04/19   Pokhrel, Corrie Mckusick, MD  polyethylene glycol (MIRALAX / GLYCOLAX) 17 g packet Take 17 g by mouth daily as needed for mild constipation. Patient not taking: Reported on 10/27/2019 10/07/19   [place] Donnamarie Poag, DO  traZODone (DESYREL) 50 MG tablet Take 1 tablet (50 mg total) by mouth at bedtime as needed for sleep. Patient not taking: Reported on 10/27/2019 10/07/19 11/06/19  [place] Eric J, DO  Vitamin D, Ergocalciferol, (DRISDOL) 1.25 MG (50000 UNIT) CAPS capsule Take 1 capsule (50,000 Units total) by mouth every 7 (seven) days. Patient not taking: Reported on 10/27/2019 10/07/19   [place] Eric J, [place]    Physical Exam: Vitals:   01/12/2020 1508 01/31/2020 1553 02/02/2020 1618 01/25/2020 1719  BP: (!) 105/45 (!) 105/45 122/81 96/85  Pulse: (!) 111 (!) 111  (!) 115  Resp: 18  18  (!) 21  SpO2: 94% 94%  100%  Weight:      Height:        Constitutional: In mild distress secondary to shortness of breath and intermittent coughing spells; chronically ill in appearance. Vitals:   01/31/2020 1508 01/18/2020 1553 01/18/2020 1618 02/05/2020 1719  BP: (!) 105/45 (!) 105/45 122/81 96/85  Pulse: (!) 111 (!) 111  (!) 115  Resp: 18 18  (!) 21  SpO2: 94% 94%  100%  Weight:      Height:       Eyes: PERRL, lids and conjunctivae normal, no icterus, no nystagmus. ENMT: Mucous membranes are moist. Posterior pharynx clear of any exudate or lesions. Neck: normal, supple, no masses, no thyromegaly Respiratory: Mild respiratory wheezing, diffuse rhonchi bilaterally, positive tachypnea. Cardiovascular: Sinus tachycardia, no rubs, no gallops, no JVD on exam. Abdomen: no tenderness, no masses palpated. No  hepatosplenomegaly. Bowel sounds positive.  Musculoskeletal: no clubbing / cyanosis. No joint deformity upper and lower extremities. Good ROM, no contractures. Normal muscle tone.  Skin: no rashes, no petechiae. Neurologic: CN 2-12 grossly intact. Sensation intact, DTR normal.  Muscle strength 4 out of 5 bilaterally symmetrically in the setting of poor effort. Psychiatric: Normal judgment and insight. Alert and oriented x 3. Normal mood.   Labs on Admission: I have personally reviewed following labs and imaging studies  CBC: Recent Labs  Lab 01/20/2020 0810 01/29/2020 0813  WBC 35.7*  --   HGB 13.8 15.3  HCT 43.2 45.0  MCV 88.7  --   PLT 341  --     Basic Metabolic Panel: Recent Labs  Lab 02/02/2020 0810 01/30/2020 0813 01/18/2020 0953  NA 139 139  --   K 3.3* 3.4*  --   CL 95* 98  --   CO2 25  --   --   GLUCOSE 55* 53*  --   BUN 31* 29*  --   CREATININE 6.67* 6.40*  --   CALCIUM 9.1  --   --   MG  --   --  2.3  PHOS  --   --  8.1*    GFR: Estimated Creatinine Clearance: 11.6 mL/min (A) (by C-G formula based on SCr of 6.4 mg/dL (H)).  Liver Function Tests: Recent Labs  Lab 01/19/2020 0810  AST 13*  ALT 9  ALKPHOS 100  BILITOT 0.9  PROT 5.4*  ALBUMIN 1.9*    Urine analysis:    Component Value Date/Time   COLORURINE YELLOW 05/21/2016 [place] 05/21/2016 0941   LABSPEC 1.020 05/21/2016 0941   PHURINE 8.0 05/21/2016 0941   GLUCOSEU NEGATIVE 05/21/2016 0941   HGBUR LARGE (A) 05/21/2016 0941   BILIRUBINUR NEGATIVE 05/21/2016 0941   KETONESUR NEGATIVE 05/21/2016 0941   PROTEINUR >300 (A) 05/21/2016 0941   UROBILINOGEN 0.2 04/11/2010 2336   NITRITE NEGATIVE 05/21/2016 0941   LEUKOCYTESUR NEGATIVE 05/21/2016 0941    Radiological Exams on Admission: DG Chest Port 1 View  Result Date: 01/19/2020 CLINICAL DATA:  Shortness of breath, vomiting, diarrhea, end-stage renal disease EXAM: PORTABLE CHEST 1 VIEW COMPARISON:  10/29/2019 FINDINGS: Small to  moderate left effusion noted which may be partially loculated with associated left mid and lower lung consolidative airspace opacity compatible with pneumonia. Right lower lung scarring noted peripherally. Normal heart size and vascularity. No superimposed CHF pattern. No pneumothorax. Trachea midline. Aorta atherosclerotic. Curvilinear gas pattern in the epigastric region and left upper quadrant lucency, incompletely imaged by portable upright chest x-ray. Difficult  to exclude pneumoperitoneum. Recommend follow-up abdominal series. IMPRESSION: Left mid and lower lung consolidative airspace process with associated effusion compatible with pneumonia. Nonspecific gas pattern in the epigastric region and left upper quadrant subdiaphragmatic lucency, difficult to exclude pneumoperitoneum. Recommend follow-up abdominal series. These results were called by telephone at the time of interpretation on 02/04/2020 at 8:26 am to provider [facility] , who verbally acknowledged these results. Electronically Signed   By: Jerilynn Mages.  Shick M.D.   On: 01/19/2020 08:31   DG Abd Portable 2 Views  Result Date: 01/18/2020 CLINICAL DATA:  Vomiting and diarrhea. EXAM: X-RAY ABDOMEN 2 VIEWS COMPARISON:  Chest x-ray from earlier the same day. Abdomen x-ray from 10/27/2019. FINDINGS: Upright film shows marked collapse/consolidative opacity in the left lower hemithorax with volume loss and probable effusion. Lucency noted under the left hemidiaphragm, likely related to stomach bubble. Diffuse gaseous distention of bowel noted on the supine image. IMPRESSION: 1. Lucency under the left hemidiaphragm again noted, most likely related to stomach bubble. Right-side-up decubitus film could be used to more definitively exclude intraperitoneal free gas. 2. Marked collapse/consolidative opacity in the left lower hemithorax with probable left pleural effusion. 3. Diffuse gaseous distention of bowel. Electronically Signed   By: Misty Delmore M.D.   On:  01/30/2020 09:25    EKG: Independently reviewed.  Sinus tachycardia, no acute ischemic changes.  Assessment/Plan 1-sepsis ([place]) due to HCAP: Present on admission (patient with tachycardia, increased respiratory rate, leukocytosis, positive findings on chest x-ray and with acute respiratory failure with hypoxia). -Sepsis protocol initiated -Patient received 1250 mL bolus of fluids and kept on 50 cc/h for another 15 hours -Will follow lactic acid, procalcitonin and clinical response; lactic acid 2.0 on presentation. -Started on vancomycin and meropenem -Prednisone and bronchodilators nebulization provided -Blood cultures, strep pneumo and Legionella antigen has been ordered -Will also follow expectorated sputum culture and urine culture. -Will provide supportive care.  2-history of tobacco abuse -Cessation counseling has been provided -Will continue the use of nicotine patch.  3-ESRD -Nephrology service has been contacted -Planning for hemodialysis at night 2321 -Renal diet with fluid restriction has been ordered.  4-chronic pain syndrome -Continue home dose of oxycodone and Robaxin as needed  5-hypertension -Slightly soft on presentation -Will provide fluid resuscitation and hold overnight his carvedilol dose; close monitoring for potential rebound tachycardia.  6-insomnia -Continue as needed trazodone.  7-leukocytosis -In the setting of problem #1 -Gentle precipitation will be provided -Patient started on broad-spectrum antibiotics and will follow culture results -Repeat CBC in a.m. to follow WBCs trend.   DVT prophylaxis: Heparin Code Status:   Full code. Family Communication: No family at bedside. Disposition Plan:   Patient is from:  Home  Anticipated DC to:  Home  Anticipated DC date:  To be determined  Anticipated DC barriers: Correction of sepsis and improvement in his respiratory status.  Consults called:  Dr. Candiss Norse (nephrology service).   Admission status:    Stepdown bed, inpatient, length of stay more than 2 midnights.  Severity of Illness: Severe severity; patient will be admitted for treatment of sepsis secondary to HCAP (sepsis criteria has been met on admission), high comorbidities secondary to end-stage renal disease and chronic diastolic heart failure.     Barton Dubois MD [facility]  How to contact the [facility] Attending or Consulting provider [place] or covering provider during after hours [place], for this patient?   1. Check the care team in [facility] and look for a) attending/consulting [facility] provider  listed and b) the [facility] team listed 2. Log into [email or web address] and use [place]'s universal password to access. If you do not have the password, please contact the hospital operator. 3. Locate the [facility] provider you are looking for under [facility] and page to a number that you can be directly reached. 4. If you still have difficulty reaching the provider, please page the [facility] (Director on Call) for the Hospitalists listed on amion for assistance.  01/30/2020, 5:20 PM

## 2020-02-01 NOTE — ED Triage Notes (Signed)
Pt called EMS due to respiratory distress. Vomited x1 and diarrhea x1. O2 sats 77% on RA. 85% on nonrebreather. Pt did not tolerate CPAP. Diminished lung sounds. Bilateral edema to both arms. Pt last had dialysis yesterday.

## 2020-02-01 NOTE — Progress Notes (Signed)
Pharmacy Antibiotic Note  Steven Sellers is a 59 y.o. male admitted on 01/28/2020 with pneumonia.  Pharmacy has been consulted for Vancomycin and meropenem dosing.  Plan: Vancomycin 1250 mg IV x 1 dose. Vancomycin 750 mg IV every T-Th-Sat w/ HD. Meropenem 500 mg IV every 24 hours post HD. Monitor labs, c/s, and vanco level as indicated.  Height: 6' (182.9 cm) Weight: 66.7 kg (147 lb) IBW/kg (Calculated) : 77.6  No data recorded.  Recent Labs  Lab 01/12/2020 0810 02/06/2020 0813 01/21/2020 0953  WBC 35.7*  --   --   CREATININE 6.67* 6.40*  --   LATICACIDVEN  --   --  2.0*    Estimated Creatinine Clearance: 11.6 mL/min (A) (by C-G formula based on SCr of 6.4 mg/dL (H)).    Allergies  Allergen Reactions  . Diltiazem     Patient recalls feeling horribly sick, presyncopal after taking  . Other Other (See Comments)    IV contrast- Renal issues    Antimicrobials this admission: Vanco 9/22 >>  Merrem 9/22 >>    Microbiology results: 9/22 BCx: pending 9/22 UCx: pending  9/22 Sputum: pending    Thank you for allowing pharmacy to be a part of this patient's care.  Ramond Craver 01/31/2020 1:40 PM

## 2020-02-01 NOTE — ED Notes (Signed)
Pt does not want to be on nonrebreather. Have converted to 5L [place]. Sats 93-99%

## 2020-02-01 NOTE — ED Notes (Signed)
Pt wife at bedside.

## 2020-02-01 NOTE — ED Notes (Signed)
Pt felt like he couldn't breathe with [place]. Have applied nonrebreather back on patient.

## 2020-02-01 NOTE — ED Notes (Signed)
Pt is very agitated. Have been trying to get him situated in bed, but he will not tolerate moving. Have notified Dr. Sedonia Small.

## 2020-02-02 ENCOUNTER — Inpatient Hospital Stay (HOSPITAL_COMMUNITY): Payer: Medicare Other

## 2020-02-02 DIAGNOSIS — R6520 Severe sepsis without septic shock: Secondary | ICD-10-CM

## 2020-02-02 DIAGNOSIS — Z515 Encounter for palliative care: Secondary | ICD-10-CM

## 2020-02-02 DIAGNOSIS — G8929 Other chronic pain: Secondary | ICD-10-CM

## 2020-02-02 DIAGNOSIS — Z66 Do not resuscitate: Secondary | ICD-10-CM

## 2020-02-02 DIAGNOSIS — Z992 Dependence on renal dialysis: Secondary | ICD-10-CM

## 2020-02-02 DIAGNOSIS — I10 Essential (primary) hypertension: Secondary | ICD-10-CM

## 2020-02-02 LAB — RENAL FUNCTION PANEL
Albumin: 1.5 g/dL — ABNORMAL LOW (ref 3.5–5.0)
Albumin: 2.6 g/dL — ABNORMAL LOW (ref 3.5–5.0)
Anion gap: 16 — ABNORMAL HIGH (ref 5–15)
Anion gap: 11 (ref 5–15)
BUN: 38 mg/dL — ABNORMAL HIGH (ref 6–20)
BUN: 16 mg/dL (ref 6–20)
CO2: 26 mmol/L (ref 22–32)
CO2: 25 mmol/L (ref 22–32)
Calcium: 8.4 mg/dL — ABNORMAL LOW (ref 8.9–10.3)
Calcium: 7.8 mg/dL — ABNORMAL LOW (ref 8.9–10.3)
Chloride: 99 mmol/L (ref 98–111)
Chloride: 98 mmol/L (ref 98–111)
Creatinine, Ser: 6.83 mg/dL — ABNORMAL HIGH (ref 0.61–1.24)
Creatinine, Ser: 3.1 mg/dL — ABNORMAL HIGH (ref 0.61–1.24)
GFR calc Af Amer: 9 mL/min — ABNORMAL LOW (ref >60)
GFR calc Af Amer: 24 mL/min — ABNORMAL LOW (ref >60)
GFR calc non Af Amer: 8 mL/min — ABNORMAL LOW (ref >60)
GFR calc non Af Amer: 21 mL/min — ABNORMAL LOW (ref >60)
Glucose, Bld: 39 mg/dL — CL (ref 70–99)
Glucose, Bld: 143 mg/dL — ABNORMAL HIGH (ref 70–99)
Phosphorus: 8.9 mg/dL — ABNORMAL HIGH (ref 2.5–4.6)
Phosphorus: 5.9 mg/dL — ABNORMAL HIGH (ref 2.5–4.6)
Potassium: 3.8 mmol/L (ref 3.5–5.1)
Potassium: 2.8 mmol/L — ABNORMAL LOW (ref 3.5–5.1)
Sodium: 141 mmol/L (ref 135–145)
Sodium: 134 mmol/L — ABNORMAL LOW (ref 135–145)

## 2020-02-02 LAB — CBC
HCT: 35.7 % — ABNORMAL LOW (ref 39.0–52.0)
HCT: 28.4 % — ABNORMAL LOW (ref 39.0–52.0)
Hemoglobin: 11.1 g/dL — ABNORMAL LOW (ref 13.0–17.0)
Hemoglobin: 8.6 g/dL — ABNORMAL LOW (ref 13.0–17.0)
MCH: 28.3 pg (ref 26.0–34.0)
MCH: 29 pg (ref 26.0–34.0)
MCHC: 31.1 g/dL (ref 30.0–36.0)
MCHC: 30.3 g/dL (ref 30.0–36.0)
MCV: 91.1 fL (ref 80.0–100.0)
MCV: 95.6 fL (ref 80.0–100.0)
Platelets: 267 10*3/uL (ref 150–400)
Platelets: 232 10*3/uL (ref 150–400)
RBC: 3.92 MIL/uL — ABNORMAL LOW (ref 4.22–5.81)
RBC: 2.97 MIL/uL — ABNORMAL LOW (ref 4.22–5.81)
RDW: 15.9 % — ABNORMAL HIGH (ref 11.5–15.5)
RDW: 15.9 % — ABNORMAL HIGH (ref 11.5–15.5)
WBC: 16.6 10*3/uL — ABNORMAL HIGH (ref 4.0–10.5)
WBC: 18.6 10*3/uL — ABNORMAL HIGH (ref 4.0–10.5)
nRBC: 0 % (ref 0.0–0.2)
nRBC: 0.1 % (ref 0.0–0.2)

## 2020-02-02 LAB — PROTIME-INR
INR: 1.5 — ABNORMAL HIGH (ref 0.8–1.2)
Prothrombin Time: 17.3 seconds — ABNORMAL HIGH (ref 11.4–15.2)

## 2020-02-02 LAB — CORTISOL-AM, BLOOD: Cortisol - AM: >100 ug/dL — ABNORMAL HIGH (ref 6.7–22.6)

## 2020-02-02 LAB — CBG MONITORING, ED
Glucose-Capillary: 192 mg/dL — ABNORMAL HIGH (ref 70–99)
Glucose-Capillary: 36 mg/dL — CL (ref 70–99)
Glucose-Capillary: 158 mg/dL — ABNORMAL HIGH (ref 70–99)
Glucose-Capillary: 125 mg/dL — ABNORMAL HIGH (ref 70–99)

## 2020-02-02 LAB — PROCALCITONIN: Procalcitonin: 50.43 ng/mL

## 2020-02-02 MED ORDER — SODIUM CHLORIDE 0.9% FLUSH: 3.0000 mL | Freq: Two times a day (BID) | INTRAVENOUS | Status: DC

## 2020-02-02 MED ORDER — DEXTROSE 10 % IV SOLN: INTRAVENOUS | Status: DC

## 2020-02-02 MED ORDER — LORAZEPAM 2 MG/ML IJ SOLN: 0.5000 mg | INTRAMUSCULAR | Status: DC | PRN

## 2020-02-02 MED ORDER — ALTEPLASE 2 MG IJ SOLR
2.0000 mg | Freq: Once | INTRAMUSCULAR | Status: DC | PRN
  Filled 2020-02-02: qty 2

## 2020-02-02 MED ORDER — PENTAFLUOROPROP-TETRAFLUOROETH EX AERO: 1.0000 "application " | INHALATION_SPRAY | CUTANEOUS | Status: DC | PRN

## 2020-02-02 MED ORDER — HEPARIN SODIUM (PORCINE) 1000 UNIT/ML DIALYSIS
1000.0000 [IU] | INTRAMUSCULAR | Status: DC | PRN
  Filled 2020-02-02: qty 1

## 2020-02-02 MED ORDER — HALOPERIDOL LACTATE 2 MG/ML PO CONC
0.5000 mg | ORAL | Status: DC | PRN
  Filled 2020-02-02: qty 0.3

## 2020-02-02 MED ORDER — HALOPERIDOL 0.5 MG PO TABS
0.5000 mg | ORAL_TABLET | ORAL | Status: DC | PRN
  Filled 2020-02-02: qty 1

## 2020-02-02 MED ORDER — SODIUM CHLORIDE 0.9% FLUSH: 3.0000 mL | INTRAVENOUS | Status: DC | PRN

## 2020-02-02 MED ORDER — GLYCOPYRROLATE 0.2 MG/ML IJ SOLN: 0.2000 mg | INTRAMUSCULAR | Status: DC | PRN

## 2020-02-02 MED ORDER — DEXTROSE 50 % IV SOLN: 1.0000 | Freq: Once | INTRAVENOUS | Status: AC

## 2020-02-02 MED ORDER — LIDOCAINE-PRILOCAINE 2.5-2.5 % EX CREA: 1.0000 "application " | TOPICAL_CREAM | CUTANEOUS | Status: DC | PRN

## 2020-02-02 MED ORDER — LIDOCAINE HCL (PF) 1 % IJ SOLN: 5.0000 mL | INTRAMUSCULAR | Status: DC | PRN

## 2020-02-02 MED ORDER — HYDROMORPHONE HCL 1 MG/ML IJ SOLN
1.0000 mg | Freq: Four times a day (QID) | INTRAMUSCULAR | Status: DC
  Administered 2020-02-02: 1 mg via INTRAVENOUS
  Filled 2020-02-02: qty 1

## 2020-02-02 MED ORDER — CARVEDILOL 3.125 MG PO TABS: 3.1250 mg | ORAL_TABLET | Freq: Two times a day (BID) | ORAL | Status: DC

## 2020-02-02 MED ORDER — HEPARIN SODIUM (PORCINE) 1000 UNIT/ML DIALYSIS
500.0000 [IU] | INTRAMUSCULAR | Status: DC | PRN
  Filled 2020-02-02: qty 1

## 2020-02-02 MED ORDER — SODIUM CHLORIDE 0.9 % IV SOLN: 250.0000 mL | INTRAVENOUS | Status: DC | PRN

## 2020-02-02 MED ORDER — DEXTROSE 50 % IV SOLN
50.0000 mL | Freq: Once | INTRAVENOUS | Status: AC
  Administered 2020-02-02: 50 mL via INTRAVENOUS

## 2020-02-02 MED ORDER — GLYCOPYRROLATE 1 MG PO TABS
1.0000 mg | ORAL_TABLET | ORAL | Status: DC | PRN
  Filled 2020-02-02: qty 1

## 2020-02-02 MED ORDER — SODIUM CHLORIDE 0.9 % IV SOLN: 100.0000 mL | INTRAVENOUS | Status: DC | PRN

## 2020-02-02 MED ORDER — POLYVINYL ALCOHOL 1.4 % OP SOLN
1.0000 [drp] | Freq: Four times a day (QID) | OPHTHALMIC | Status: DC | PRN
  Filled 2020-02-02: qty 15

## 2020-02-02 MED ORDER — DEXTROSE 50 % IV SOLN
INTRAVENOUS | Status: AC
  Administered 2020-02-02: 50 mL via INTRAVENOUS
  Filled 2020-02-02: qty 50

## 2020-02-02 MED ORDER — HYDROCORTISONE NA SUCCINATE PF 100 MG IJ SOLR
50.0000 mg | Freq: Three times a day (TID) | INTRAMUSCULAR | Status: DC
  Administered 2020-02-02: 50 mg via INTRAVENOUS
  Filled 2020-02-02: qty 2

## 2020-02-02 MED ORDER — ACETAMINOPHEN 650 MG RE SUPP: 650.0000 mg | Freq: Four times a day (QID) | RECTAL | Status: DC | PRN

## 2020-02-02 MED ORDER — ALBUMIN HUMAN 25 % IV SOLN
25.0000 g | Freq: Once | INTRAVENOUS | Status: AC
  Administered 2020-02-02: 25 g via INTRAVENOUS
  Filled 2020-02-02: qty 100

## 2020-02-02 MED ORDER — ONDANSETRON 4 MG PO TBDP: 4.0000 mg | ORAL_TABLET | Freq: Four times a day (QID) | ORAL | Status: DC | PRN

## 2020-02-02 MED ORDER — HYDROMORPHONE HCL 1 MG/ML IJ SOLN: 0.5000 mg | INTRAMUSCULAR | Status: DC | PRN

## 2020-02-02 MED ORDER — HALOPERIDOL LACTATE 5 MG/ML IJ SOLN: 0.5000 mg | INTRAMUSCULAR | Status: DC | PRN

## 2020-02-02 MED ORDER — ONDANSETRON HCL 4 MG/2ML IJ SOLN: 4.0000 mg | Freq: Four times a day (QID) | INTRAMUSCULAR | Status: DC | PRN

## 2020-02-02 MED ORDER — ACETAMINOPHEN 325 MG PO TABS: 650.0000 mg | ORAL_TABLET | Freq: Four times a day (QID) | ORAL | Status: DC | PRN

## 2020-02-02 MED ORDER — BIOTENE DRY MOUTH MT LIQD: 15.0000 mL | OROMUCOSAL | Status: DC | PRN

## 2020-02-02 NOTE — ED Notes (Signed)
Family remains at bedside, comfort measures provided,

## 2020-02-02 NOTE — ED Notes (Addendum)
Date and time results received: 02/02/20  (use smartphrase ".now" to insert current time)  Test: glucose Critical Value: 42  Name of Provider Notified:  Orders Received? Or Actions Taken?: Administered 1 amp of D/50 per protocol

## 2020-02-02 NOTE — Progress Notes (Signed)
Daily Progress Note   Patient Name: Steven Sellers       Date: 02/02/2020 DOB: 10-05-1960  Age: 59 y.o. MRN#: [id number] Attending Physician: Steven Dubois, MD Primary Care Physician: Steven Sites, MD Admit Date: 02/02/2020  Reason for Consultation/Follow-up: Establishing goals of care and Terminal Care  Subjective: Patient appears to be actively dying. Agonal, shallow respirations. Unresponsive. On NRB.  GOC: Wife and daughter at bedside. Introduced role of palliative medicine. Discussed diagnoses, interventions, plan of care. Educated on comfort focused care plan including symptom management medications to ensure comfort and relief from suffering. Discussed comfort screen and unrestricted visitor access. Family ok with chaplain visit.   Wife and daughter do feel he looks uncomfortable and NRB mask is bothering him. We discussed scheduling dilaudid and eventually removing NRB mask when he appears more comfortable. Family agreeable.   Answered questions. Emotional/spiritual support provided. Discussed with RN.   Length of Stay: 1  Current Medications: Scheduled Meds:  .  HYDROmorphone (DILAUDID) injection  1 mg Intravenous Q6H  . nicotine  21 mg Transdermal Daily  . sodium chloride flush  3 mL Intravenous Q12H    Continuous Infusions: . sodium chloride      PRN Meds: sodium chloride, acetaminophen **OR** acetaminophen, antiseptic oral rinse, glycopyrrolate **OR** glycopyrrolate **OR** glycopyrrolate, haloperidol **OR** haloperidol **OR** haloperidol lactate, HYDROmorphone (DILAUDID) injection, ipratropium-albuterol, LORazepam, [DISCONTINUED] ondansetron **OR** ondansetron (ZOFRAN) IV, ondansetron **OR** ondansetron (ZOFRAN) IV, polyvinyl alcohol, sodium chloride flush  Physical  Exam Vitals and nursing note reviewed.  Constitutional:      Appearance: He is cachectic. He is ill-appearing.     Comments: unresponsive  Cardiovascular:     Rate and Rhythm: Normal rate.  Pulmonary:     Effort: No tachypnea, accessory muscle usage or respiratory distress.     Breath sounds: Decreased breath sounds present.     Comments: Shallow, agonal  Skin:    General: Skin is cool and dry.     Comments: Cool, mottled  Neurological:     Comments: unresponsive            Vital Signs: BP (!) 108/55   Pulse 91   Temp 98.8 F (37.1 C) (Oral)   Resp 16   Ht 6' (1.829 m)   Wt 63 kg   SpO2 99%   BMI 18.84 kg/m  SpO2: SpO2: 99 % O2 Device: O2 Device: NRB O2 Flow Rate: O2 Flow Rate (L/min): 15 L/min  Intake/output summary: No intake or output data in the 24 hours ending 02/02/20 1303 LBM:   Baseline Weight: Weight: 66.7 kg Most recent weight: Weight: 63 kg       Palliative Assessment/Data: PPS 10%      Patient Active Problem List   Diagnosis Date Noted  . Sepsis ([place]) 02/07/2020  . Adult failure to thrive   . Weakness generalized   . Palliative care by specialist   . DNR (do not resuscitate) discussion   . Ventricular tachyarrhythmia ([place]) 10/27/2019  . Acute respiratory failure with hypoxemia ([place]) 10/27/2019  . Atrial fibrillation with RVR ([place]) 10/03/2019  . Closed fracture of right distal femur ([place]) 10/02/2019  . Closed displaced supracondylar fracture of distal end of right femur with intracondylar extension ([place]) 10/01/2019  . Chronic a-fib ----Declined Anti-coagulation due to Frequent Bruising 10/01/2019  . H/o Pulmonary embolism -2012 10/01/2019  . Atrial fibrillation, new onset ([place]) 02/26/2017  . First time seizure ([place])   . Left arm weakness 09/25/2016  . CHF (congestive heart failure) ([place]) 12/16/2015  . CKD (chronic kidney disease) requiring chronic dialysis ([place]) 12/16/2015  . SOB (shortness of breath) 12/16/2015  . Central venous catheter in  place   . Hyponatremia 11/30/2015  . Anemia 11/30/2015  . ESRD needing dialysis ([place]) 11/30/2015  . Acute renal failure (ARF) ([place]) 11/30/2015  . Hyperkalemia 05/12/2015  . 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 Muscle atrophy 04/25/2015  . Arm weakness 04/25/2015  . Leg weakness 04/25/2015  . Dysesthesia 04/25/2015  . Essential hypertension 05/21/2010  . H/o PULMONARY EMBOLISM in 2012 05/21/2010  . PLEURAL EFFUSION 05/21/2010  . RENAL FAILURE, ACUTE 05/21/2010    Palliative Care Assessment & Plan   Patient Profile: Per H&P: HPI: Steven Sellers is a 59 y.o. male with medical history significant of with past medical history significant for end-stage renal disease, hypertension, tobacco abuse prior history of alcohol abuse/seizures, insomnia chronic pain syndrome; who presented to the hospital secondary to shortness of breath, productive cough not feeling well.  Symptoms have been present for the last 2 days and worsening.  He reports chills but no frank fevers; expressed having dialysis as usual on 01/31/2020.  No sick contacts reported.  Patient expressed one episode of nausea/vomiting and one episode of diarrhea the night prior to admission.  Per records at the time EMS arrived patient O2 sats were in the 77% range on room air.   On 9/23, patient with worsening respiratory status, agonal breathing, and unresponsiveness during dialysis. Steven Sellers spoke with family at bedside. Decision made for DNR/DNI and transition to comfort measures only. No further dialysis.  Assessment: Sepsis HCAP ESRD on hemodialysis Chronic pain Hx of tobacco abuse Essential hypertension Hypoglycemia  Recommendations/Plan:  Transition to comfort measures following Steven Sellers discussion with wife and daughter. Interventions not aimed at comfort have been discontinued.   Comfort meds on MAR. Scheduled dilaudid 1mg  IV q6h for pain/dyspnea. (Pt with history of chronic pain).  Cardiac  monitor to comfort screen.   Unrestricted visitor access.  Transfer to med-surg for comfort care if bed available.   Anticipate hospital death.   Goals of Care and Additional Recommendations:  Limitations on Scope of Treatment: Full Comfort Care  Code Status: DNR/DNI   Code Status Orders  (From admission, onward)         Start  Ordered   02/02/20 1150  Do not attempt resuscitation (DNR)  Continuous       Question Answer Comment  In the event of cardiac or respiratory ARREST Do not call a "code blue"   In the event of cardiac or respiratory ARREST Do not perform Intubation, CPR, defibrillation or ACLS   In the event of cardiac or respiratory ARREST Use medication by any route, position, wound care, and other measures to relive pain and suffering. May use oxygen, suction and manual treatment of airway obstruction as needed for comfort.      02/02/20 1152        Code Status History    Date Active Date Inactive Code Status Order ID Comments User Context   01/29/2020 0954 02/02/2020 1149 Full Code [id number]  Steven Dubois, MD ED   10/27/2019 1731 11/04/2019 0032 Full Code [id number]  Roxan Hockey, MD ED   10/01/2019 2054 10/08/2019 0025 Full Code [id number]  Roxan Hockey, MD Inpatient   02/26/2017 1703 02/28/2017 1653 Full Code [id number]  Janece Canterbury, MD ED   09/25/2016 1616 09/27/2016 1732 Full Code [id number]  Eber Jones, MD Inpatient   05/19/2016 0533 05/22/2016 2044 Full Code [id number]  Orvan Falconer, MD ED   12/16/2015 0603 12/17/2015 1950 Full Code [id number]  Phillips Grout, MD Inpatient   11/30/2015 1654 12/04/2015 1146 Full Code [id number]  Jani Gravel, MD Inpatient   05/12/2015 1733 05/13/2015 1748 Full Code [id number]  Erline Hau, MD Inpatient   Advance Care Planning Activity       Prognosis:   Hours - Days  Discharge Planning:  Anticipated Hospital Death  Care plan was discussed with RN, Steven Sellers, wife, daughter  Thank you for allowing  the Palliative Medicine Team to assist in the care of this patient.   Total Time 30 Prolonged Time Billed         Greater than 50%  of this time was spent counseling and coordinating care related to the above assessment and plan.  Ihor Dow, DNP, FNP-C Palliative Medicine Team  Phone: [phone] Fax: [phone]  Please contact Palliative Medicine Team phone at [phone] for questions and concerns.

## 2020-02-02 NOTE — TOC Initial Note (Signed)
Transition of Care [facility]) - Initial/Assessment Note    Patient Details  Name: Steven Sellers MRN: [id number] Date of Birth: [birth year]  [facility] CM/SW Contact:    Iona Beard, [place] Phone Number: 02/02/2020, 9:53 AM  Clinical Narrative:                 Pt admitted for sepsis. Pt high risk for readmission. Pt from home with wife. Wife stated pt is currently active with [facility]. Pt cannot complete ADLs without help. Pt does not drive and uses [employer] transportation. Pt has a walker, cane, and wheelchair. Pt gets dialysis at [facility] in [place]. Wife states they are in need of bed pads and depends, CSW spoke with [facility], family will have to provide. CSW will update family. Will need HH resumption orders at D/C. TOC to follow.   Expected Discharge Plan: [place] Barriers to Discharge: Continued Medical Work up   Patient Goals and CMS Choice Patient states their goals for this hospitalization and ongoing recovery are:: Return home with [facility]      Expected Discharge Plan and Services Expected Discharge Plan: [place] Choice: [place] arrangements for the past 2 months: Single Family Home                           HH Arranged: PT, RN [facility] Agency: [place] ([place]) Date HH Agency Contacted: 02/02/20 Time [place]: [phone] Representative spoke with at [place]: Vaughan Basta  Prior Living Arrangements/Services Living arrangements for the past 2 months: [place] with:: Spouse Patient language and need for interpreter reviewed:: Yes Do you feel safe going back to the place where you live?: Yes        Care giver support system in place?: Yes (comment) Current home services: Home PT, Home RN Criminal Activity/Legal Involvement Pertinent to Current Situation/Hospitalization: No - Comment as needed  Activities of Daily Living      Permission Sought/Granted                   Emotional Assessment       Orientation: : Oriented to Self, Oriented to Place Alcohol / Substance Use: Never Used Psych Involvement: No (comment)  Admission diagnosis:  Sepsis ([place]) [A41.9] Patient Active Problem List   Diagnosis Date Noted  . Sepsis ([place]) 01/13/2020  . Adult failure to thrive   . Weakness generalized   . Palliative care by specialist   . DNR (do not resuscitate) discussion   . Ventricular tachyarrhythmia ([place]) 10/27/2019  . Acute respiratory failure with hypoxemia ([place]) 10/27/2019  . Atrial fibrillation with RVR ([place]) 10/03/2019  . Closed fracture of right distal femur ([place]) 10/02/2019  . Closed displaced supracondylar fracture of distal end of right femur with intracondylar extension ([place]) 10/01/2019  . Chronic a-fib ----Declined Anti-coagulation due to Frequent Bruising 10/01/2019  . H/o Pulmonary embolism -2012 10/01/2019  . Atrial fibrillation, new onset ([place]) 02/26/2017  . First time seizure ([place])   . Left arm weakness 09/25/2016  . CHF (congestive heart failure) ([place]) 12/16/2015  . CKD (chronic kidney disease) requiring chronic dialysis ([place]) 12/16/2015  . SOB (shortness of breath) 12/16/2015  . Central venous catheter in place   . Hyponatremia 11/30/2015  . Anemia 11/30/2015  . ESRD needing dialysis ([place]) 11/30/2015  . Acute renal failure (ARF) ([place]) 11/30/2015  .  Hyperkalemia 05/12/2015  . 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 Muscle atrophy 04/25/2015  . Arm weakness 04/25/2015  . Leg weakness 04/25/2015  . Dysesthesia 04/25/2015  . Essential hypertension 05/21/2010  . H/o PULMONARY EMBOLISM in 2012 05/21/2010  . PLEURAL EFFUSION 05/21/2010  . RENAL FAILURE, ACUTE 05/21/2010   PCP:  Sharilyn Sites, MD Pharmacy:   [facility] [address], [place] - [place] [address] [place] [address] [place] [address] Phone: [phone] Fax: [phone]     Social Determinants of Health (SDOH) Interventions     Readmission Risk Interventions Readmission Risk Prevention Plan 11/02/2019  Transportation Screening Complete  PCP or Specialist Appt within 3-5 Days Complete  HRI or [place] Complete  Social Work Consult for [place] Planning/Counseling Complete  Palliative Care Screening Complete  Medication Review Press photographer) Referral to Pharmacy  Some recent data might be hidden

## 2020-02-02 NOTE — Progress Notes (Signed)
Present with Steven Sellers and his family for emotional and spiritual support. Prayed with them.

## 2020-02-02 NOTE — ED Notes (Addendum)
Nurse spoke with Dr. and he is aware of changes in the pt status. Dr. stated he assessed pt earlier this morning during Diaylsis, pt was talking and answered his questions and did verbalize to Dr. his leg was hurting.  Dr. recommended to decrease pain meds for status change. Nurse notified Dr she had not given any pain meds to pt.  Nurse made Dr aware pt seems to have declined , grey faced, pin point pupils and staring off in space. Nurse told Dr  she and diaylsis nurse felt as if CPR could need to be done at any time. Dr verbalized he will put in Palliative care consult.

## 2020-02-02 NOTE — ED Notes (Signed)
Pt appears more comfortable, family at bedside,

## 2020-02-02 NOTE — ED Notes (Signed)
Diaylsis nurse here

## 2020-02-02 NOTE — Progress Notes (Signed)
I was contacted by patient's nurse in the ED secondary to worsening in his respiratory status with agonal breathing and unresponsive event with staring look while completing his dialysis treatment.  Patient with overall stable vital signs, but requiring significant amount of oxygen supplementation and also having some bigeminy on telemetry.  Appears to be flaccid, no moving extremities, no following commands, no tracking and despite visit by patient's wife and daughter at bedside, no reaction was able to be elicited by him.  Dialysis was terminated following recommendations by nephrology service after 2 hours and 18 minutes.  Long discussions at bedside with daughter and wife regarding goals of care, ongoing agonal breathing and concern for his Ability for airway protection; decision was made to pursuit only symptomatic management and comfort care.  Initial concerns of investigating for potential seizure with a CT head, EEG and further blood work at this moment has been discontinue and the patient started on end-of-life care.  He is DNR/DNI, will be admitted to [facility] bed and palliative care consulted to further assist with comfort management.  CBGs were checked and within normal limits at this time.  Plan: -End-of-life protocol -Ativan Dilaudid for agitation and comfort -Robinul for increased secretions -Oxygen supplementation to be given from a comfort standpoint -Comfort feeding -As needed nebulizer and antiemetics will be added to her regimen. -Overall prognosis is poor and I would not be surprised the patient in the dying in the next couple hours to days.  Barton Dubois MD [phone]

## 2020-02-02 NOTE — ED Notes (Signed)
Dr Dyann Kief at bedside speaking with family

## 2020-02-02 NOTE — ED Notes (Signed)
Odd behaviors and unknown if possible seizure activity. Nurse requested Dr. to assess via secure chat.

## 2020-02-02 NOTE — ED Notes (Signed)
Pt noted to have absent seizure like activity for less than a minute. Pt not able to communicate, vital signs stable. Pt noted to be back at baseline per Primary RN. hospitalist paged.

## 2020-02-02 NOTE — Progress Notes (Signed)
PROGRESS NOTE    Steven Sellers  VFI:[id number] DOB: [birth year] DOA: 01/30/2020 PCP: Sharilyn Sites, MD     Chief Complaint  Patient presents with  . Respiratory Distress    Brief Narrative:  Steven Sellers is a 59 y.o. male with medical history significant of with past medical history significant for end-stage renal disease, hypertension, tobacco abuse prior history of alcohol abuse/seizures, insomnia chronic pain syndrome; who presented to the hospital secondary to shortness of breath, productive cough not feeling well.  Symptoms have been present for the last 2 days and worsening.  He reports chills but no frank fevers; expressed having dialysis as usual on 01/31/2020.  No sick contacts reported.  Patient expressed one episode of nausea/vomiting and one episode of diarrhea the night prior to admission.  Per records at the time EMS arrived patient O2 sats were in the 77% range on room air.  He denies abdominal pain, headaches, vision disturbances, focal weakness, dysuria, hematuria, melena, hematochezia, chest pain, hemoptysis or any other complaints.  Covid test by PCR negative.  ED Course: Chest x-ray obtained able to demonstrate left lower lobe infiltrates compatible with pneumonia patient requiring oxygen supplementation to maintain O2 sat above 89%; elevated WBCs, positive tachypnea, and positive tachycardia; lactic acid 2.0.  Patient met sepsis criteria on presentation.  Cultures taken, antibiotics initiated after resuscitation started.  TRH called to admit patient for further evaluation and management.  Assessment & Plan: 1-sepsis due to HCAP: Present on admission -Overall sepsis features improving (no fever, procalcitonin, lactic acid and WBC's trending down); still having some intermittent episode of tachypnea with minimal exertion, having mild tachycardia and requiring oxygen supplementation. -Hemodialysis will be provided today -Continue to closely follow patient clinical  response, as he overall prognosis and assessment is guarded (very deconditioned and chronically ill in appearance) -Will continue treatment with vancomycin and meropenem -Continue nebulizer management, bronchodilators nebulization and the use of flutter valve -Follow cultures results. -Continue supportive care and follow cortisol level and use of solucortef   2-end-stage renal disease -Nephrology service has been consulted and will follow recommendation for dialysis and adjustments into his electrolytes as needed -HD to be provided on 02/02/2020  3-history of tobacco abuse -Cessation counseling provided -Continue nicotine patch  4-essential hypertension -Blood pressure stable, even slightly soft at times. -Will follow vital signs -Resume coreg low dos ein am if BP remains stable.  5-chronic pain syndrome -Continue low-dose oxycodone Robaxin as needed -Patient educated about the effects of these medications and soft blood pressure at this time.  6-insomnia -Continue the use of trazodone  7-leukocytosis -In the setting of problem #1 -Continue current antibiotics and treatment as described above. -WBCs trending down.    8-hypoglycemia -in the setting of sepsis and poor oral intake -solucortef started -will check cortisol level -receiving gentle D10 infusion currently.   DVT prophylaxis: Heparin Code Status: Full code Family Communication: Wife updated over the phone. Disposition:   Status is: Inpatient  Dispo: The patient is from: Home              Anticipated d/c is to: Home              Anticipated d/c date is: 2 days or so              Patient currently is not medically stable for discharge at this time; continue to require oxygen supplementation short winded and with difficulty speaking in full sentences; isolated episode of hypoglycemia x2 seen, and is need  for hemodialysis.     Consultants:   Nephrology service   Procedures:  See below for x-ray  reports. Hemodialysis planned for later today 02/02/2020   Antimicrobials: Meropenem and vancomycin   Subjective: Afebrile, still requiring oxygen supplementation, complaining of shortness of breath and increased tachypnea with activity.  2 episode of hypoglycemia appreciated overnight.  Complaining of chronic back pain and pain in his legs..  Objective: Vitals:   02/02/20 0400 02/02/20 0430 02/02/20 0500 02/02/20 0530  BP: 139/65 129/68 (!) 149/66 (!) 154/87  Pulse: (!) 103   (!) 107  Resp: (!) 22 19 (!) 22 19  Temp:      TempSrc:      SpO2: 100%   100%  Weight:      Height:       No intake or output data in the 24 hours ending 02/02/20 0813 Filed Weights   01/20/2020 0933  Weight: 66.7 kg    Examination:  General exam: Afebrile currently sleepy this morning; reporting still trouble with his breathing and requiring oxygen supplementation.  Denies chest pain, no nausea or vomiting. Respiratory system: Positive scattered rhonchi right, decreased breath sounds at the bases, no frank crackles, positive tachypnea with minimal exertion Cardiovascular system: Slight sinus tachycardia; sinus rhythm appreciated on examination.  No JVD, no rubs or gallops appreciated. Gastrointestinal system: Abdomen is nondistended, soft and nontender. No organomegaly or masses felt. Normal bowel sounds heard. Central nervous system: Alert and oriented. No focal neurological deficits appreciated.. Extremities: No cyanosis, no clubbing, no edema. Skin: No rashes, no petechiae. Psychiatry: Judgement and insight appear normal. Mood & affect appropriate.     Data Reviewed: I have personally reviewed following labs and imaging studies  CBC: Recent Labs  Lab 01/14/2020 0810 01/30/2020 0813 02/02/20 0326  WBC 35.7*  --  16.6*  HGB 13.8 15.3 11.1*  HCT 43.2 45.0 35.7*  MCV 88.7  --  91.1  PLT 341  --  314    Basic Metabolic Panel: Recent Labs  Lab 01/20/2020 0810 01/23/2020 0813 02/08/2020 0953  02/02/20 0326  NA 139 139  --  141  K 3.3* 3.4*  --  3.8  CL 95* 98  --  99  CO2 25  --   --  26  GLUCOSE 55* 53*  --  39*  BUN 31* 29*  --  38*  CREATININE 6.67* 6.40*  --  6.83*  CALCIUM 9.1  --   --  8.4*  MG  --   --  2.3  --   PHOS  --   --  8.1* 8.9*    GFR: Estimated Creatinine Clearance: 10.9 mL/min (A) (by C-G formula based on SCr of 6.83 mg/dL (H)).  Liver Function Tests: Recent Labs  Lab 01/25/2020 0810 02/02/20 0326  AST 13*  --   ALT 9  --   ALKPHOS 100  --   BILITOT 0.9  --   PROT 5.4*  --   ALBUMIN 1.9* 1.5*    CBG: Recent Labs  Lab 02/02/20 0551 02/02/20 0612  GLUCAP 36* 192*     Recent Results (from the past 240 hour(s))  SARS Coronavirus 2 by RT PCR (hospital order, performed in [facility] hospital lab) Nasopharyngeal Nasopharyngeal Swab     Status: None   Collection Time: 01/22/2020  8:12 AM   Specimen: Nasopharyngeal Swab  Result Value Ref Range Status   SARS Coronavirus 2 NEGATIVE NEGATIVE Final    Comment: (NOTE) SARS-CoV-2 target nucleic acids are NOT  DETECTED.  The SARS-CoV-2 RNA is generally detectable in upper and lower respiratory specimens during the acute phase of infection. The lowest concentration of SARS-CoV-2 viral copies this assay can detect is 250 copies / mL. A negative result does not preclude SARS-CoV-2 infection and should not be used as the sole basis for treatment or other patient management decisions.  A negative result may occur with improper specimen collection / handling, submission of specimen other than nasopharyngeal swab, presence of viral mutation(s) within the areas targeted by this assay, and inadequate number of viral copies (<250 copies / mL). A negative result must be combined with clinical observations, patient history, and epidemiological information.  Fact Sheet for Patients:   StrictlyIdeas.no  Fact Sheet for Healthcare  Providers: BankingDealers.co.za  This test is not yet approved or  cleared by the [place] FDA and has been authorized for detection and/or diagnosis of SARS-CoV-2 by FDA under an Emergency Use Authorization (EUA).  This EUA will remain in effect (meaning this test can be used) for the duration of the COVID-19 declaration under Section 564(b)(1) of the Act, 21 U.S.C. section 360bbb-3(b)(1), unless the authorization is terminated or revoked sooner.  Performed at [facility], [address], [place], [place] [address]   Blood culture (routine single)     Status: None (Preliminary result)   Collection Time: 01/26/2020  9:53 AM   Specimen: BLOOD RIGHT HAND  Result Value Ref Range Status   Specimen Description BLOOD RIGHT HAND  Final   Special Requests   Final    Blood Culture adequate volume BOTTLES DRAWN AEROBIC AND ANAEROBIC   Culture   Final    NO GROWTH < 24 HOURS Performed at [facility], [address] [place], [place] [address]    Report Status PENDING  Incomplete     Radiology Studies: DG Chest Port 1 View  Result Date: 01/14/2020 CLINICAL DATA:  Shortness of breath, vomiting, diarrhea, end-stage renal disease EXAM: PORTABLE CHEST 1 VIEW COMPARISON:  10/29/2019 FINDINGS: Small to moderate left effusion noted which may be partially loculated with associated left mid and lower lung consolidative airspace opacity compatible with pneumonia. Right lower lung scarring noted peripherally. Normal heart size and vascularity. No superimposed CHF pattern. No pneumothorax. Trachea midline. Aorta atherosclerotic. Curvilinear gas pattern in the epigastric region and left upper quadrant lucency, incompletely imaged by portable upright chest x-ray. Difficult to exclude pneumoperitoneum. Recommend follow-up abdominal series. IMPRESSION: Left mid and lower lung consolidative airspace process with associated effusion compatible with pneumonia. Nonspecific gas pattern in the  epigastric region and left upper quadrant subdiaphragmatic lucency, difficult to exclude pneumoperitoneum. Recommend follow-up abdominal series. These results were called by telephone at the time of interpretation on 02/06/2020 at 8:26 am to provider [facility] , who verbally acknowledged these results. Electronically Signed   By: Jerilynn Mages.  Shick M.D.   On: 01/20/2020 08:31   DG Abd Portable 2 Views  Result Date: 01/13/2020 CLINICAL DATA:  Vomiting and diarrhea. EXAM: X-RAY ABDOMEN 2 VIEWS COMPARISON:  Chest x-ray from earlier the same day. Abdomen x-ray from 10/27/2019. FINDINGS: Upright film shows marked collapse/consolidative opacity in the left lower hemithorax with volume loss and probable effusion. Lucency noted under the left hemidiaphragm, likely related to stomach bubble. Diffuse gaseous distention of bowel noted on the supine image. IMPRESSION: 1. Lucency under the left hemidiaphragm again noted, most likely related to stomach bubble. Right-side-up decubitus film could be used to more definitively exclude intraperitoneal free gas. 2. Marked collapse/consolidative opacity in the  left lower hemithorax with probable left pleural effusion. 3. Diffuse gaseous distention of bowel. Electronically Signed   By: Misty Yanes M.D.   On: 01/15/2020 09:25    Scheduled Meds: . budesonide (PULMICORT) nebulizer solution  0.5 mg Nebulization BID  . [START ON 02-19-20] carvedilol  3.125 mg Oral BID WC  . Chlorhexidine Gluconate Cloth  6 each Topical Q0600  . heparin  5,000 Units Subcutaneous Q8H  . hydrocortisone sod succinate (SOLU-CORTEF) inj  50 mg Intravenous Q8H  . nicotine  21 mg Transdermal Daily   Continuous Infusions: . dextrose 75 mL/hr at 02/02/20 0604  . meropenem (MERREM) IV    . vancomycin       LOS: 1 day    Time spent: 35 minutes.    Barton Dubois, MD [facility]   To contact the attending provider between 7A-7P or the covering provider during after hours 7P-7A, please log  into the web site [email or web address] and access using universal [place] password for that web site. If you do not have the password, please call the hospital operator.  02/02/2020, 8:13 AM

## 2020-02-02 NOTE — Procedures (Signed)
     HEMODIALYSIS TREATMENT NOTE:   Pre-dialysis patient was responsive to voice and c/o generalized discomfort, requesting to "sit up" although HOB was elevated to max level.  He was restless but agreed to dialysis.  Tx initiated via LUE AVF (15g/antegrade).  UF was limited by hypotension despite cooling of dialysate and administration of Albumin 25g x2.  History of seizures noted.  Had one episode of blank stare with complete flaccidity not r/t hypotension that lasted less than one minute.  Became less responsive afterwards, would not answer questions or maintain/track eye contact but moved all extremities.  Dr. Dyann Kief was notified by primary RN.  Wife came to patient's bedside and was unable to elicit any reaction from patient.  Breathing noted to be more shallow with accessory muscle use.  No spontaneous movement of extremities.  Dr. Tomi Bamberger and Dr. Dyann Kief were notified.    After d/w Dr. Candiss Norse, dialysis was discontinued after 2 hours and 18 minutes. All blood was returned.  Dr. Dyann Kief met with pt's wife and daughter at bedside to discuss prognosis.  Family has opted for transition to comfort care.  Rockwell Alexandria, RN

## 2020-02-02 NOTE — ED Notes (Signed)
Pt breathing status changed, agonal at times, Dr Tomi Bamberger notified and at bedside, Dr Dyann Kief paged. Family at bedside as well

## 2020-02-02 NOTE — ED Notes (Signed)
Pt CBG 36 after first amp of D50, second amp given and MD notified. Orders received to start D10 gtt at this time. [facility] RN called about dialysis due to concern of fluid overload, stated she would come on in and start his dialysis first thing this morning.

## 2020-02-06 LAB — CULTURE, BLOOD (SINGLE)
Culture: NO GROWTH
Special Requests: ADEQUATE

## 2020-02-10 NOTE — Death Summary Note (Signed)
Death Summary  Steven Sellers AOZ:[id number] DOB: 10/03/1960 DOA: 02/23/20  PCP: Sharilyn Sites, MD  Admit date: Feb 23, 2020 Date of Death: 02/25/20 Time of Death: 1111/07/04   History of present illness:   Steven Sellers a 59 y.o.malewith medical history significant ofwith past medical history significant for end-stage renal disease, hypertension, tobacco abuse prior history of alcohol abuse/seizures, insomnia chronic pain syndrome; who presented to the hospital secondary to shortness of breath, productive cough not feeling well. Symptoms have been present for the last 2 days and worsening.He reports chills but no frank fevers; expressed having dialysis as usual on 01/31/2020. No sick contacts reported. Patient expressed one episode of nausea/vomiting and one episode of diarrhea the night prior to admission. Per records at the time EMS arrived patient O2 sats were in the 77%range on room air.  He denied abdominal pain, headaches, vision disturbances, focal weakness, dysuria, hematuria, melena, hematochezia,chest pain, hemoptysis or any other complaints. Covid test by PCR negative. Chest x-ray  demonstrated left lower lobe infiltrates compatible with pneumonia patient requiring oxygen supplementation to maintain O2 sat above 89%; elevated WBCs, positive tachypnea, and positive tachycardia; lactic acid 2.0. Patient met sepsis criteria on presentation. Cultures taken, antibiotics initiated after resuscitation started. TRH called to admit patient for further evaluation and management. Patient started on broad-spectrum antibiotic for coverage of healthcare associated pneumonia.  Nephrology was also following for dialysis.  His respiratory status did not improve.  Palliative care was consulted.  After extensive discussion with family, his care was transitioned to comfort. He passed away on Feb 25, 2020 at 11:13 AM.   Final Diagnoses:  1.   Acute respiratory failure with hypoxia secondary  to healthcare associated  pneumonia.   The results of significant diagnostics from this hospitalization (including imaging, microbiology, ancillary and laboratory) are listed below for reference.    Significant Diagnostic Studies: DG Chest Port 1 View  Result Date: 02-23-20 CLINICAL DATA:  Shortness of breath, vomiting, diarrhea, end-stage renal disease EXAM: PORTABLE CHEST 1 VIEW COMPARISON:  10/29/2019 FINDINGS: Small to moderate left effusion noted which may be partially loculated with associated left mid and lower lung consolidative airspace opacity compatible with pneumonia. Right lower lung scarring noted peripherally. Normal heart size and vascularity. No superimposed CHF pattern. No pneumothorax. Trachea midline. Aorta atherosclerotic. Curvilinear gas pattern in the epigastric region and left upper quadrant lucency, incompletely imaged by portable upright chest x-ray. Difficult to exclude pneumoperitoneum. Recommend follow-up abdominal series. IMPRESSION: Left mid and lower lung consolidative airspace process with associated effusion compatible with pneumonia. Nonspecific gas pattern in the epigastric region and left upper quadrant subdiaphragmatic lucency, difficult to exclude pneumoperitoneum. Recommend follow-up abdominal series. These results were called by telephone at the time of interpretation on 2020/02/23 at 8:26 am to provider [facility] , who verbally acknowledged these results. Electronically Signed   By: Jerilynn Mages.  Shick M.D.   On: 02-23-2020 08:31   DG Abd Portable 2 Views  Result Date: February 23, 2020 CLINICAL DATA:  Vomiting and diarrhea. EXAM: X-RAY ABDOMEN 2 VIEWS COMPARISON:  Chest x-ray from earlier the same day. Abdomen x-ray from 10/27/2019. FINDINGS: Upright film shows marked collapse/consolidative opacity in the left lower hemithorax with volume loss and probable effusion. Lucency noted under the left hemidiaphragm, likely related to stomach bubble. Diffuse gaseous distention of  bowel noted on the supine image. IMPRESSION: 1. Lucency under the left hemidiaphragm again noted, most likely related to stomach bubble. Right-side-up decubitus film could be used to more definitively exclude intraperitoneal free gas. 2. Marked  collapse/consolidative opacity in the left lower hemithorax with probable left pleural effusion. 3. Diffuse gaseous distention of bowel. Electronically Signed   By: Misty Veazey M.D.   On: 01/12/2020 09:25    Microbiology: Recent Results (from the past 240 hour(s))  SARS Coronavirus 2 by RT PCR (hospital order, performed in [facility] hospital lab) Nasopharyngeal Nasopharyngeal Swab     Status: None   Collection Time: 01/27/2020  8:12 AM   Specimen: Nasopharyngeal Swab  Result Value Ref Range Status   SARS Coronavirus 2 NEGATIVE NEGATIVE Final    Comment: (NOTE) SARS-CoV-2 target nucleic acids are NOT DETECTED.  The SARS-CoV-2 RNA is generally detectable in upper and lower respiratory specimens during the acute phase of infection. The lowest concentration of SARS-CoV-2 viral copies this assay can detect is 250 copies / mL. A negative result does not preclude SARS-CoV-2 infection and should not be used as the sole basis for treatment or other patient management decisions.  A negative result may occur with improper specimen collection / handling, submission of specimen other than nasopharyngeal swab, presence of viral mutation(s) within the areas targeted by this assay, and inadequate number of viral copies (<250 copies / mL). A negative result must be combined with clinical observations, patient history, and epidemiological information.  Fact Sheet for Patients:   StrictlyIdeas.no  Fact Sheet for Healthcare Providers: BankingDealers.co.za  This test is not yet approved or  cleared by the [place] FDA and has been authorized for detection and/or diagnosis of SARS-CoV-2 by FDA under an Emergency  Use Authorization (EUA).  This EUA will remain in effect (meaning this test can be used) for the duration of the COVID-19 declaration under Section 564(b)(1) of the Act, 21 U.S.C. section 360bbb-3(b)(1), unless the authorization is terminated or revoked sooner.  Performed at [facility], [address], [place], [place] [address]   Blood culture (routine single)     Status: None (Preliminary result)   Collection Time: 01/14/2020  9:53 AM   Specimen: BLOOD RIGHT HAND  Result Value Ref Range Status   Specimen Description BLOOD RIGHT HAND  Final   Special Requests   Final    Blood Culture adequate volume BOTTLES DRAWN AEROBIC AND ANAEROBIC   Culture   Final    NO GROWTH 2 DAYS Performed at [facility], [address] [place], [place] [address]    Report Status PENDING  Incomplete     Labs: Basic Metabolic Panel: Recent Labs  Lab 01/14/2020 0810 01/29/2020 0810 02/04/2020 0813 01/16/2020 0813 01/31/2020 0953 02/02/20 0326 02/02/20 1113  NA 139  --  139  --   --  141 134*  K 3.3*   < > 3.4*   < >  --  3.8 2.8*  CL 95*  --  98  --   --  99 98  CO2 25  --   --   --   --  26 25  GLUCOSE 55*  --  53*  --   --  39* 143*  BUN 31*  --  29*  --   --  38* 16  CREATININE 6.67*  --  6.40*  --   --  6.83* 3.10*  CALCIUM 9.1  --   --   --   --  8.4* 7.8*  MG  --   --   --   --  2.3  --   --   PHOS  --   --   --   --  8.1*  8.9* 5.9*   < > = values in this interval not displayed.   Liver Function Tests: Recent Labs  Lab 01/13/2020 0810 02/02/20 0326 02/02/20 1113  AST 13*  --   --   ALT 9  --   --   ALKPHOS 100  --   --   BILITOT 0.9  --   --   PROT 5.4*  --   --   ALBUMIN 1.9* 1.5* 2.6*   No results for input(s): LIPASE, AMYLASE in the last 168 hours. No results for input(s): AMMONIA in the last 168 hours. CBC: Recent Labs  Lab 02/06/2020 0810 02/09/2020 0813 02/02/20 0326 02/02/20 1113  WBC 35.7*  --  16.6* 18.6*  HGB 13.8 15.3 11.1* 8.6*  HCT 43.2 45.0 35.7* 28.4*  MCV 88.7  --   91.1 95.6  PLT 341  --  267 232   Cardiac Enzymes: No results for input(s): CKTOTAL, CKMB, CKMBINDEX, TROPONINI in the last 168 hours. D-Dimer No results for input(s): DDIMER in the last 72 hours. BNP: Invalid input(s): POCBNP CBG: Recent Labs  Lab 02/02/20 0551 02/02/20 0612 02/02/20 0922 02/02/20 1107  GLUCAP 36* 192* 158* 125*   Anemia work up No results for input(s): VITAMINB12, FOLATE, FERRITIN, TIBC, IRON, RETICCTPCT in the last 72 hours. Urinalysis    Component Value Date/Time   COLORURINE YELLOW 05/21/2016 [place] 05/21/2016 0941   LABSPEC 1.020 05/21/2016 0941   PHURINE 8.0 05/21/2016 0941   GLUCOSEU NEGATIVE 05/21/2016 0941   HGBUR LARGE (A) 05/21/2016 0941   BILIRUBINUR NEGATIVE 05/21/2016 0941   KETONESUR NEGATIVE 05/21/2016 0941   PROTEINUR >300 (A) 05/21/2016 0941   UROBILINOGEN 0.2 04/11/2010 2336   NITRITE NEGATIVE 05/21/2016 0941   LEUKOCYTESUR NEGATIVE 05/21/2016 0941   Sepsis Labs Invalid input(s): PROCALCITONIN,  WBC,  LACTICIDVEN     SIGNED:  Shelly Coss, MD  [facility] 18-Feb-2020, 12:51 PM Pager [phone]  If 7PM-7AM, please contact night-coverage [email or web address] Password [email or web address]

## 2020-02-10 NOTE — Progress Notes (Signed)
Present with Steven Sellers and [name] for emotional and spiritual support at [name]'s death. Prayers for continuing comfort and peace.

## 2020-02-10 NOTE — ED Notes (Signed)
I assumed care of this patient at this time. Pt moved from AP02 to AP12, Pt resting comfortably in bed at this time. Family at bedside. Educated on rounding and to contact staff if assistance is needed. Will continue observation at this time.

## 2020-02-10 NOTE — Progress Notes (Signed)
Transitioned to comfort care. Will sign off from a nephrology perspective. Please call with any questions/concerns.  Gean Quint, MD [facility]

## 2020-02-10 NOTE — ED Notes (Signed)
ED TO INPATIENT HANDOFF REPORT  ED Nurse Name and Phone #: [phone]  S Name/Age/Gender Steven Sellers 59 y.o. male Room/Bed: APA02/APA02  Code Status   Code Status: DNR  Home/SNF/Other Home  Is this baseline?  Triage Complete: Triage complete  Chief Complaint Sepsis [facility]) [A41.9]  Triage Note Pt called EMS due to respiratory distress. Vomited x1 and diarrhea x1. O2 sats 77% on RA. 85% on nonrebreather. Pt did not tolerate CPAP. Diminished lung sounds. Bilateral edema to both arms. Pt last had dialysis yesterday.     Allergies Allergies  Allergen Reactions  . Diltiazem     Patient recalls feeling horribly sick, presyncopal after taking  . Other Other (See Comments)    IV contrast- Renal issues    Level of Care/Admitting Diagnosis ED Disposition    ED Disposition Condition [place]: [facility] [[id number]]  Level of Care: Med-Surg [16]  Covid Evaluation: Confirmed COVID Negative  Diagnosis: Sepsis [facility]) [[id number]]  Admitting Physician: Barton Dubois [[id number]]  Attending Physician: Barton Dubois [[id number]]  Estimated length of stay: past midnight tomorrow  Certification:: I certify this patient will need inpatient services for at least 2 midnights       B Medical/Surgery History Past Medical History:  Diagnosis Date  . Alcohol use   . Anemia of chronic disease   . Chest pain with normal coronary angiography 2002  . ESRD (end stage renal disease) ([place])    Hemodialysis [facility] in [place]  . [place]   . Hypertension   . 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 PAF (paroxysmal atrial fibrillation) ([place])    diagnosed 2018  . Pulmonary embolism ([place]) 2011  . Seizures ([place]) 09/2016  . Tobacco abuse   . Vision abnormalities   . Vitamin D deficiency    Past Surgical History:  Procedure Laterality Date  . A/V FISTULAGRAM Left 11/05/2016   Procedure: A/V Fistulagram;  Surgeon: Waynetta Sandy, MD;  Location: [place]  CV LAB;  Service: Cardiovascular;  Laterality: Left;  . A/V FISTULAGRAM Left 01/18/2018   Procedure: A/V FISTULAGRAM - left upper extremity;  Surgeon: Waynetta Sandy, MD;  Location: [place] CV LAB;  Service: Cardiovascular;  Laterality: Left;  . AV FISTULA PLACEMENT Left 12/26/2015   Procedure: CREATION OF LEFT RADIAL-CEPHALIC ARTERIOVENOUS FISTULA FOR HEMODIALYSIS;  Surgeon: Vickie Epley, MD;  Location: [facility];  Service: Vascular;  Laterality: Left;  . AV FISTULA PLACEMENT Left 04/28/2016   Procedure: REVISION OF LEFT RADIAL CEPHALIC ARTERIOVENOUS (AV) FISTULA  FOR DURABLE HEMODIALYSIS;  Surgeon: Vickie Epley, MD;  Location: [facility];  Service: Vascular;  Laterality: Left;  . CARDIAC SURGERY    . CENTRAL VENOUS CATHETER INSERTION Right 12/04/2015   Procedure: INSERTION OF TUNNELED HEMODIALYSIS CATHETER RIGHT INTERNAL JUGULAR;  Surgeon: Vickie Epley, MD;  Location: [facility];  Service: General;  Laterality: Right;  . IR REMOVAL TUN CV CATH W/O FL  12/10/2016  . ORIF FEMUR FRACTURE Right 10/03/2019   Procedure: OPEN REDUCTION INTERNAL FIXATION (ORIF) DISTAL FEMUR FRACTURE;  Surgeon: Shona Needles, MD;  Location: [place];  Service: Orthopedics;  Laterality: Right;  . PERIPHERAL VASCULAR BALLOON ANGIOPLASTY Left 01/18/2018   Procedure: PERIPHERAL VASCULAR BALLOON ANGIOPLASTY;  Surgeon: Waynetta Sandy, MD;  Location: [place] CV LAB;  Service: Cardiovascular;  Laterality: Left;  upper arm fistula  . REVISON OF ARTERIOVENOUS FISTULA Left 07/07/2016   Procedure: CREATION OF LEFT ARM BRACHIOCEPHALIC FISTULA;  Surgeon: Georgia Dom  Donzetta Matters, MD;  Location: [facility] OR;  Service: Vascular;  Laterality: Left;     A IV Location/Drains/Wounds Patient Lines/Drains/Airways Status    Active Line/Drains/Airways    Name Placement date Placement time Site Days   Peripheral IV 01/25/2020 Right Arm 01/26/2020  0812  Arm  2   Fistula / Graft Left Upper arm Arteriovenous fistula --  --  Upper  arm     Incision (Closed) 10/27/19 Leg Right 10/27/19  2200   99          Intake/Output Last 24 hours  Intake/Output Summary (Last 24 hours) at 02-10-2020 1018 Last data filed at 02/02/2020 1100 Gross per 24 hour  Intake --  Output 552 ml  Net -552 ml    Labs/Imaging Results for orders placed or performed during the hospital encounter of 01/22/2020 (from the past 48 hour(s))  Lactic acid, plasma     Status: None   Collection Time: 01/22/2020  3:47 PM  Result Value Ref Range   Lactic Acid, Venous 1.6 0.5 - 1.9 mmol/L    Comment: Performed at [facility], [address], [place], [place] [address]  Protime-INR     Status: Abnormal   Collection Time: 02/02/20  3:26 AM  Result Value Ref Range   Prothrombin Time 17.3 (H) 11.4 - 15.2 seconds   INR 1.5 (H) 0.8 - 1.2    Comment: (NOTE) INR goal varies based on device and disease states. Performed at [facility], [address], [place], [place] [address]   Cortisol-am, blood     Status: Abnormal   Collection Time: 02/02/20  3:26 AM  Result Value Ref Range   Cortisol - AM >100.0 (H) 6.7 - 22.6 ug/dL    Comment: RESULTS CONFIRMED BY MANUAL DILUTION Performed at [facility], [place] [address], [place], [place] [address]   Procalcitonin     Status: None   Collection Time: 02/02/20  3:26 AM  Result Value Ref Range   Procalcitonin 50.43 ng/mL    Comment:        Interpretation: PCT >= 10 ng/mL: Important systemic inflammatory response, almost exclusively due to severe bacterial sepsis or septic shock. (NOTE)       Sepsis PCT Algorithm           Lower Respiratory Tract                                      Infection PCT Algorithm    ----------------------------     ----------------------------         PCT < 0.25 ng/mL                PCT < 0.10 ng/mL          Strongly encourage             Strongly discourage   discontinuation of antibiotics    initiation of antibiotics    ----------------------------      -----------------------------       PCT 0.25 - 0.50 ng/mL            PCT 0.10 - 0.25 ng/mL               OR       >80% decrease in PCT            Discourage initiation of  antibiotics      Encourage discontinuation           of antibiotics    ----------------------------     -----------------------------         PCT >= 0.50 ng/mL              PCT 0.26 - 0.50 ng/mL                AND       <80% decrease in PCT             Encourage initiation of                                             antibiotics       Encourage continuation           of antibiotics    ----------------------------     -----------------------------        PCT >= 0.50 ng/mL                  PCT > 0.50 ng/mL               AND         increase in PCT                  Strongly encourage                                      initiation of antibiotics    Strongly encourage escalation           of antibiotics                                     -----------------------------                                           PCT <= 0.25 ng/mL                                                 OR                                        > 80% decrease in PCT                                      Discontinue / Do not initiate                                             antibiotics  Performed at [facility], [address] [place], [place] [address]   CBC     Status: Abnormal   Collection Time: 02/02/20  3:26  AM  Result Value Ref Range   WBC 16.6 (H) 4.0 - 10.5 K/uL   RBC 3.92 (L) 4.22 - 5.81 MIL/uL   Hemoglobin 11.1 (L) 13.0 - 17.0 g/dL   HCT 35.7 (L) 39 - 52 %   MCV 91.1 80.0 - 100.0 fL   MCH 28.3 26.0 - 34.0 pg   MCHC 31.1 30.0 - 36.0 g/dL   RDW 15.9 (H) 11.5 - 15.5 %   Platelets 267 150 - 400 K/uL   nRBC 0.0 0.0 - 0.2 %    Comment: Performed at [facility], [address], [place], [place] [address]  Renal function panel     Status: Abnormal   Collection Time: 02/02/20  3:26 AM   Result Value Ref Range   Sodium 141 135 - 145 mmol/L   Potassium 3.8 3.5 - 5.1 mmol/L   Chloride 99 98 - 111 mmol/L   CO2 26 22 - 32 mmol/L   Glucose, Bld 39 (LL) 70 - 99 mg/dL    Comment: Glucose reference range applies only to samples taken after fasting for at least 8 hours. CRITICAL RESULT CALLED TO, READ BACK BY AND VERIFIED WITH: HARDEN,J AT 5:15AM ON 02/02/20 BY FESTERMAN,C    BUN 38 (H) 6 - 20 mg/dL   Creatinine, Ser 6.83 (H) 0.61 - 1.24 mg/dL   Calcium 8.4 (L) 8.9 - 10.3 mg/dL   Phosphorus 8.9 (H) 2.5 - 4.6 mg/dL   Albumin 1.5 (L) 3.5 - 5.0 g/dL   GFR calc non Af Amer 8 (L) >60 mL/min   GFR calc Af Amer 9 (L) >60 mL/min   Anion gap 16 (H) 5 - 15    Comment: Performed at [facility], [address] [place], [place] [address]  CBG monitoring, ED     Status: Abnormal   Collection Time: 02/02/20  5:51 AM  Result Value Ref Range   Glucose-Capillary 36 (LL) 70 - 99 mg/dL    Comment: Glucose reference range applies only to samples taken after fasting for at least 8 hours.  CBG monitoring, ED     Status: Abnormal   Collection Time: 02/02/20  6:12 AM  Result Value Ref Range   Glucose-Capillary 192 (H) 70 - 99 mg/dL    Comment: Glucose reference range applies only to samples taken after fasting for at least 8 hours.  CBG monitoring, ED     Status: Abnormal   Collection Time: 02/02/20  9:22 AM  Result Value Ref Range   Glucose-Capillary 158 (H) 70 - 99 mg/dL    Comment: Glucose reference range applies only to samples taken after fasting for at least 8 hours.  CBG monitoring, ED     Status: Abnormal   Collection Time: 02/02/20 11:07 AM  Result Value Ref Range   Glucose-Capillary 125 (H) 70 - 99 mg/dL    Comment: Glucose reference range applies only to samples taken after fasting for at least 8 hours.  Renal function panel     Status: Abnormal   Collection Time: 02/02/20 11:13 AM  Result Value Ref Range   Sodium 134 (L) 135 - 145 mmol/L    Comment: DELTA CHECK NOTED    Potassium 2.8 (L) 3.5 - 5.1 mmol/L   Chloride 98 98 - 111 mmol/L   CO2 25 22 - 32 mmol/L   Glucose, Bld 143 (H) 70 - 99 mg/dL    Comment: Glucose reference range applies only to samples taken after fasting for at least 8 hours.   BUN 16  6 - 20 mg/dL   Creatinine, Ser 3.10 (H) 0.61 - 1.24 mg/dL   Calcium 7.8 (L) 8.9 - 10.3 mg/dL   Phosphorus 5.9 (H) 2.5 - 4.6 mg/dL   Albumin 2.6 (L) 3.5 - 5.0 g/dL   GFR calc non Af Amer 21 (L) >60 mL/min   GFR calc Af Amer 24 (L) >60 mL/min   Anion gap 11 5 - 15    Comment: Performed at [facility], [address], [place], [place] [address]  CBC     Status: Abnormal   Collection Time: 02/02/20 11:13 AM  Result Value Ref Range   WBC 18.6 (H) 4.0 - 10.5 K/uL   RBC 2.97 (L) 4.22 - 5.81 MIL/uL   Hemoglobin 8.6 (L) 13.0 - 17.0 g/dL   HCT 28.4 (L) 39 - 52 %   MCV 95.6 80.0 - 100.0 fL   MCH 29.0 26.0 - 34.0 pg   MCHC 30.3 30.0 - 36.0 g/dL   RDW 15.9 (H) 11.5 - 15.5 %   Platelets 232 150 - 400 K/uL   nRBC 0.1 0.0 - 0.2 %    Comment: Performed at [facility], [address], [place], [place] [address]   No results found.  Pending Labs Unresulted Labs (From admission, onward)         None      Vitals/Pain Today's Vitals   02/02/20 1800 02/02/20 2251 02/07/20 0749 2020-02-07 0849  BP: (!) 116/51     Pulse: 90     Resp: (!) 8  (!) 5 (!) 7  Temp:      TempSrc:      SpO2: 100%     Weight:      Height:      PainSc:  Asleep      Isolation Precautions No active isolations  Medications Medications  ondansetron (ZOFRAN) injection 4 mg (4 mg Intravenous Given 01/18/2020 1234)  nicotine (NICODERM CQ - dosed in mg/24 hours) patch 21 mg (21 mg Transdermal Patch Applied 02/02/20 1016)  ipratropium-albuterol (DUONEB) 0.5-2.5 (3) MG/3ML nebulizer solution 3 mL (has no administration in time range)  acetaminophen (TYLENOL) tablet 650 mg (has no administration in time range)    Or  acetaminophen (TYLENOL) suppository 650 mg (has no administration in time  range)  haloperidol (HALDOL) tablet 0.5 mg (has no administration in time range)    Or  haloperidol (HALDOL) 2 MG/ML solution 0.5 mg (has no administration in time range)    Or  haloperidol lactate (HALDOL) injection 0.5 mg (has no administration in time range)  ondansetron (ZOFRAN-ODT) disintegrating tablet 4 mg (has no administration in time range)    Or  ondansetron (ZOFRAN) injection 4 mg (has no administration in time range)  glycopyrrolate (ROBINUL) tablet 1 mg (has no administration in time range)    Or  glycopyrrolate (ROBINUL) injection 0.2 mg (has no administration in time range)    Or  glycopyrrolate (ROBINUL) injection 0.2 mg (has no administration in time range)  antiseptic oral rinse (BIOTENE) solution 15 mL (has no administration in time range)  polyvinyl alcohol (LIQUIFILM TEARS) 1.4 % ophthalmic solution 1 drop (has no administration in time range)  sodium chloride flush (NS) 0.9 % injection 3 mL (3 mLs Intravenous Not Given 02/07/20 0717)  sodium chloride flush (NS) 0.9 % injection 3 mL (has no administration in time range)  0.9 %  sodium chloride infusion (has no administration in time range)  HYDROmorphone (DILAUDID) injection 0.5 mg (has no administration in time range)  HYDROmorphone (DILAUDID) injection 1 mg (1 mg Intravenous Not Given 02/09/2020 0715)  LORazepam (ATIVAN) injection 0.5 mg (has no administration in time range)  Ampicillin-Sulbactam (UNASYN) 3 g in sodium chloride 0.9 % 100 mL IVPB (0 g Intravenous Stopped 01/20/2020 0918)  sodium chloride 0.9 % bolus 1,000 mL (0 mLs Intravenous Stopped 01/18/2020 1109)  meropenem (MERREM) 1 g in sodium chloride 0.9 % 100 mL IVPB (0 g Intravenous Stopped 01/13/2020 1433)  sodium chloride 0.9 % bolus 1,000 mL (0 mLs Intravenous Stopped 01/31/2020 1617)  vancomycin (VANCOREADY) IVPB 1250 mg/250 mL (0 mg Intravenous Stopped 02/06/2020 1340)  HYDROmorphone (DILAUDID) injection 1 mg (1 mg Intravenous Given 01/28/2020 1142)  sodium chloride 0.9  % bolus 250 mL (0 mLs Intravenous Stopped 01/23/2020 1340)  dextrose 50 % solution 50 mL (50 mLs Intravenous Given 02/02/20 0535)  dextrose 50 % solution 50 mL (50 mLs Intravenous Given 02/02/20 0556)  albumin human 25 % solution 25 g (0 g Intravenous Stopped 02/02/20 0920)  albumin human 25 % solution 25 g (0 g Intravenous Stopped 02/02/20 1020)    Mobility non-ambulatory     Focused Assessments    R Recommendations: See Admitting Provider Note  Report given to:   Additional Notes:

## 2020-02-10 NOTE — ED Notes (Signed)
Nurse assessed no pulse, no blood pressure, no heartbeat, no respirations on patient. Family at bedside.

## 2020-02-10 NOTE — ED Notes (Signed)
This RN attempt to reach [facility] for follow up with-out success. Will attempt at a later time.

## 2020-02-10 NOTE — ED Notes (Signed)
Pt family remains at beside. Pt appears comfortable. Respirations are agonal at 7 per minute.

## 2020-02-10 NOTE — ED Notes (Signed)
This RN assess no pulse, no blood pressure, no respirations, no heart beat. Family at bedside.

## 2020-02-10 NOTE — ED Notes (Signed)
Family remains at bedside.

## 2020-02-10 NOTE — ED Notes (Signed)
Dr. Tawanna Solo made aware of patient passing at 71.

## 2020-02-10 DEATH — deceased
